# Patient Record
Sex: Female | Born: 1980
Health system: Southern US, Community
[De-identification: ages and names within clinical notes are randomized; demographics above are authoritative.]

## PROBLEM LIST (undated history)

## (undated) DIAGNOSIS — F99 Mental disorder, not otherwise specified: Secondary | ICD-10-CM

## (undated) DIAGNOSIS — F329 Major depressive disorder, single episode, unspecified: Secondary | ICD-10-CM

## (undated) DIAGNOSIS — F32A Depression, unspecified: Secondary | ICD-10-CM

## (undated) DIAGNOSIS — F259 Schizoaffective disorder, unspecified: Secondary | ICD-10-CM

## (undated) DIAGNOSIS — S32810A Multiple fractures of pelvis with stable disruption of pelvic ring, initial encounter for closed fracture: Secondary | ICD-10-CM

## (undated) DIAGNOSIS — R51 Headache: Secondary | ICD-10-CM

## (undated) DIAGNOSIS — N390 Urinary tract infection, site not specified: Secondary | ICD-10-CM

## (undated) DIAGNOSIS — R29898 Other symptoms and signs involving the musculoskeletal system: Secondary | ICD-10-CM

## (undated) DIAGNOSIS — D62 Acute posthemorrhagic anemia: Secondary | ICD-10-CM

## (undated) DIAGNOSIS — M25669 Stiffness of unspecified knee, not elsewhere classified: Secondary | ICD-10-CM

## (undated) DIAGNOSIS — S8263XA Displaced fracture of lateral malleolus of unspecified fibula, initial encounter for closed fracture: Secondary | ICD-10-CM

## (undated) DIAGNOSIS — N63 Unspecified lump in unspecified breast: Secondary | ICD-10-CM

## (undated) DIAGNOSIS — E669 Obesity, unspecified: Secondary | ICD-10-CM

## (undated) DIAGNOSIS — R112 Nausea with vomiting, unspecified: Secondary | ICD-10-CM

## (undated) DIAGNOSIS — B9620 Unspecified Escherichia coli [E. coli] as the cause of diseases classified elsewhere: Secondary | ICD-10-CM

## (undated) DIAGNOSIS — Z9889 Other specified postprocedural states: Secondary | ICD-10-CM

## (undated) DIAGNOSIS — T148XXA Other injury of unspecified body region, initial encounter: Secondary | ICD-10-CM

## (undated) DIAGNOSIS — F319 Bipolar disorder, unspecified: Secondary | ICD-10-CM

## (undated) DIAGNOSIS — F419 Anxiety disorder, unspecified: Secondary | ICD-10-CM

## (undated) DIAGNOSIS — S82143A Displaced bicondylar fracture of unspecified tibia, initial encounter for closed fracture: Secondary | ICD-10-CM

## (undated) DIAGNOSIS — G35 Multiple sclerosis: Secondary | ICD-10-CM

## (undated) DIAGNOSIS — N760 Acute vaginitis: Secondary | ICD-10-CM

## (undated) DIAGNOSIS — E785 Hyperlipidemia, unspecified: Secondary | ICD-10-CM

## (undated) HISTORY — DX: Displaced bicondylar fracture of unspecified tibia, initial encounter for closed fracture: S82.143A

## (undated) HISTORY — DX: Unspecified Escherichia coli (E. coli) as the cause of diseases classified elsewhere: B96.20

## (undated) HISTORY — DX: Urinary tract infection, site not specified: N39.0

## (undated) HISTORY — DX: Obesity, unspecified: E66.9

## (undated) HISTORY — DX: Displaced fracture of lateral malleolus of unspecified fibula, initial encounter for closed fracture: S82.63XA

## (undated) HISTORY — DX: Multiple fractures of pelvis with stable disruption of pelvic ring, initial encounter for closed fracture: S32.810A

## (undated) HISTORY — DX: Acute vaginitis: N76.0

## (undated) HISTORY — DX: Other symptoms and signs involving the musculoskeletal system: R29.898

## (undated) HISTORY — DX: Other injury of unspecified body region, initial encounter: T14.8XXA

## (undated) HISTORY — DX: Hyperlipidemia, unspecified: E78.5

## (undated) HISTORY — DX: Unspecified lump in unspecified breast: N63.0

## (undated) HISTORY — DX: Stiffness of unspecified knee, not elsewhere classified: M25.669

## (undated) HISTORY — DX: Bipolar disorder, unspecified: F31.9

---

## 2000-01-30 ENCOUNTER — Emergency Department (HOSPITAL_COMMUNITY): Admission: EM | Admit: 2000-01-30 | Discharge: 2000-01-30 | Payer: Self-pay | Admitting: Emergency Medicine

## 2000-05-10 ENCOUNTER — Encounter: Payer: Self-pay | Admitting: Emergency Medicine

## 2000-05-10 ENCOUNTER — Emergency Department (HOSPITAL_COMMUNITY): Admission: EM | Admit: 2000-05-10 | Discharge: 2000-05-10 | Payer: Self-pay | Admitting: Emergency Medicine

## 2000-10-26 ENCOUNTER — Other Ambulatory Visit: Admission: RE | Admit: 2000-10-26 | Discharge: 2000-10-26 | Payer: Self-pay | Admitting: Family Medicine

## 2000-12-13 ENCOUNTER — Other Ambulatory Visit: Admission: RE | Admit: 2000-12-13 | Discharge: 2000-12-13 | Payer: Self-pay | Admitting: Obstetrics and Gynecology

## 2000-12-14 ENCOUNTER — Encounter (INDEPENDENT_AMBULATORY_CARE_PROVIDER_SITE_OTHER): Payer: Self-pay

## 2000-12-14 ENCOUNTER — Other Ambulatory Visit: Admission: RE | Admit: 2000-12-14 | Discharge: 2000-12-14 | Payer: Self-pay | Admitting: Obstetrics and Gynecology

## 2001-07-03 ENCOUNTER — Other Ambulatory Visit: Admission: RE | Admit: 2001-07-03 | Discharge: 2001-07-03 | Payer: Self-pay | Admitting: Obstetrics and Gynecology

## 2003-10-26 DIAGNOSIS — G35 Multiple sclerosis: Secondary | ICD-10-CM

## 2003-10-26 HISTORY — DX: Multiple sclerosis: G35

## 2003-11-09 ENCOUNTER — Inpatient Hospital Stay (HOSPITAL_COMMUNITY): Admission: EM | Admit: 2003-11-09 | Discharge: 2003-11-18 | Payer: Self-pay | Admitting: Emergency Medicine

## 2003-11-12 ENCOUNTER — Encounter (INDEPENDENT_AMBULATORY_CARE_PROVIDER_SITE_OTHER): Payer: Self-pay | Admitting: Cardiology

## 2003-11-18 ENCOUNTER — Inpatient Hospital Stay (HOSPITAL_COMMUNITY)
Admission: RE | Admit: 2003-11-18 | Discharge: 2003-12-06 | Payer: Self-pay | Admitting: Physical Medicine & Rehabilitation

## 2003-12-09 ENCOUNTER — Encounter
Admission: RE | Admit: 2003-12-09 | Discharge: 2004-03-08 | Payer: Self-pay | Admitting: Physical Medicine & Rehabilitation

## 2003-12-23 ENCOUNTER — Other Ambulatory Visit: Admission: RE | Admit: 2003-12-23 | Discharge: 2003-12-23 | Payer: Self-pay | Admitting: *Deleted

## 2003-12-23 ENCOUNTER — Encounter: Admission: RE | Admit: 2003-12-23 | Discharge: 2003-12-23 | Payer: Self-pay | Admitting: Family Medicine

## 2004-03-09 ENCOUNTER — Encounter
Admission: RE | Admit: 2004-03-09 | Discharge: 2004-06-07 | Payer: Self-pay | Admitting: Physical Medicine & Rehabilitation

## 2004-08-06 ENCOUNTER — Ambulatory Visit: Payer: Self-pay | Admitting: Family Medicine

## 2004-08-11 ENCOUNTER — Encounter: Admission: RE | Admit: 2004-08-11 | Discharge: 2004-11-09 | Payer: Self-pay | Admitting: Neurology

## 2004-09-14 ENCOUNTER — Ambulatory Visit: Payer: Self-pay

## 2004-11-27 ENCOUNTER — Ambulatory Visit: Payer: Self-pay | Admitting: Sports Medicine

## 2004-12-28 ENCOUNTER — Encounter (INDEPENDENT_AMBULATORY_CARE_PROVIDER_SITE_OTHER): Payer: Self-pay | Admitting: *Deleted

## 2005-01-04 ENCOUNTER — Ambulatory Visit: Payer: Self-pay | Admitting: Family Medicine

## 2005-01-11 ENCOUNTER — Ambulatory Visit: Payer: Self-pay | Admitting: Family Medicine

## 2005-01-18 ENCOUNTER — Other Ambulatory Visit: Admission: RE | Admit: 2005-01-18 | Discharge: 2005-01-18 | Payer: Self-pay | Admitting: Family Medicine

## 2005-01-18 ENCOUNTER — Ambulatory Visit: Payer: Self-pay | Admitting: Family Medicine

## 2005-01-21 ENCOUNTER — Ambulatory Visit: Payer: Self-pay | Admitting: Family Medicine

## 2005-10-21 ENCOUNTER — Emergency Department (HOSPITAL_COMMUNITY): Admission: EM | Admit: 2005-10-21 | Discharge: 2005-10-21 | Payer: Self-pay | Admitting: Emergency Medicine

## 2005-11-03 ENCOUNTER — Encounter: Admission: RE | Admit: 2005-11-03 | Discharge: 2005-11-03 | Payer: Self-pay | Admitting: Sports Medicine

## 2005-11-03 ENCOUNTER — Ambulatory Visit: Payer: Self-pay | Admitting: Family Medicine

## 2005-12-02 ENCOUNTER — Emergency Department (HOSPITAL_COMMUNITY): Admission: EM | Admit: 2005-12-02 | Discharge: 2005-12-03 | Payer: Self-pay | Admitting: *Deleted

## 2005-12-05 ENCOUNTER — Emergency Department (HOSPITAL_COMMUNITY): Admission: EM | Admit: 2005-12-05 | Discharge: 2005-12-05 | Payer: Self-pay | Admitting: Emergency Medicine

## 2005-12-13 ENCOUNTER — Ambulatory Visit: Payer: Self-pay | Admitting: Family Medicine

## 2005-12-20 ENCOUNTER — Ambulatory Visit: Payer: Self-pay | Admitting: Family Medicine

## 2005-12-21 ENCOUNTER — Ambulatory Visit (HOSPITAL_COMMUNITY): Admission: RE | Admit: 2005-12-21 | Discharge: 2005-12-21 | Payer: Self-pay | Admitting: Family Medicine

## 2005-12-24 ENCOUNTER — Inpatient Hospital Stay (HOSPITAL_COMMUNITY): Admission: AD | Admit: 2005-12-24 | Discharge: 2005-12-24 | Payer: Self-pay | Admitting: *Deleted

## 2006-01-21 ENCOUNTER — Ambulatory Visit: Payer: Self-pay | Admitting: Family Medicine

## 2006-02-17 ENCOUNTER — Ambulatory Visit: Payer: Self-pay | Admitting: Family Medicine

## 2006-03-17 ENCOUNTER — Ambulatory Visit (HOSPITAL_COMMUNITY): Admission: RE | Admit: 2006-03-17 | Discharge: 2006-03-17 | Payer: Self-pay | Admitting: Family Medicine

## 2006-03-22 ENCOUNTER — Ambulatory Visit: Payer: Self-pay | Admitting: Family Medicine

## 2006-04-19 ENCOUNTER — Ambulatory Visit: Payer: Self-pay | Admitting: Family Medicine

## 2006-04-26 ENCOUNTER — Ambulatory Visit: Payer: Self-pay | Admitting: Family Medicine

## 2006-04-28 ENCOUNTER — Ambulatory Visit: Payer: Self-pay | Admitting: Family Medicine

## 2006-05-17 ENCOUNTER — Ambulatory Visit: Payer: Self-pay | Admitting: Family Medicine

## 2006-05-24 ENCOUNTER — Ambulatory Visit (HOSPITAL_COMMUNITY): Admission: RE | Admit: 2006-05-24 | Discharge: 2006-05-24 | Payer: Self-pay | Admitting: Internal Medicine

## 2006-06-23 ENCOUNTER — Ambulatory Visit: Payer: Self-pay | Admitting: Family Medicine

## 2006-07-04 ENCOUNTER — Inpatient Hospital Stay (HOSPITAL_COMMUNITY): Admission: AD | Admit: 2006-07-04 | Discharge: 2006-07-04 | Payer: Self-pay | Admitting: Family Medicine

## 2006-07-08 ENCOUNTER — Ambulatory Visit: Payer: Self-pay | Admitting: Family Medicine

## 2006-07-13 ENCOUNTER — Ambulatory Visit: Payer: Self-pay | Admitting: Family Medicine

## 2006-07-20 ENCOUNTER — Ambulatory Visit: Payer: Self-pay | Admitting: Family Medicine

## 2006-07-21 ENCOUNTER — Ambulatory Visit: Payer: Self-pay | Admitting: Family Medicine

## 2006-07-21 ENCOUNTER — Inpatient Hospital Stay (HOSPITAL_COMMUNITY): Admission: AD | Admit: 2006-07-21 | Discharge: 2006-07-21 | Payer: Self-pay | Admitting: Family Medicine

## 2006-07-22 ENCOUNTER — Ambulatory Visit (HOSPITAL_COMMUNITY): Admission: RE | Admit: 2006-07-22 | Discharge: 2006-07-22 | Payer: Self-pay | Admitting: Family Medicine

## 2006-07-28 ENCOUNTER — Ambulatory Visit: Payer: Self-pay | Admitting: Family Medicine

## 2006-08-02 ENCOUNTER — Ambulatory Visit: Payer: Self-pay | Admitting: Obstetrics and Gynecology

## 2006-08-02 ENCOUNTER — Inpatient Hospital Stay (HOSPITAL_COMMUNITY): Admission: AD | Admit: 2006-08-02 | Discharge: 2006-08-02 | Payer: Self-pay | Admitting: Obstetrics and Gynecology

## 2006-08-04 ENCOUNTER — Ambulatory Visit: Payer: Self-pay | Admitting: Family Medicine

## 2006-08-09 ENCOUNTER — Inpatient Hospital Stay (HOSPITAL_COMMUNITY): Admission: AD | Admit: 2006-08-09 | Discharge: 2006-08-09 | Payer: Self-pay | Admitting: Obstetrics and Gynecology

## 2006-08-09 ENCOUNTER — Ambulatory Visit: Payer: Self-pay | Admitting: *Deleted

## 2006-08-10 ENCOUNTER — Ambulatory Visit: Payer: Self-pay | Admitting: Family Medicine

## 2006-08-11 ENCOUNTER — Ambulatory Visit: Payer: Self-pay | Admitting: Obstetrics and Gynecology

## 2006-08-11 ENCOUNTER — Inpatient Hospital Stay (HOSPITAL_COMMUNITY): Admission: AD | Admit: 2006-08-11 | Discharge: 2006-08-12 | Payer: Self-pay | Admitting: Family Medicine

## 2006-08-14 ENCOUNTER — Inpatient Hospital Stay (HOSPITAL_COMMUNITY): Admission: AD | Admit: 2006-08-14 | Discharge: 2006-08-18 | Payer: Self-pay | Admitting: Obstetrics & Gynecology

## 2006-08-14 ENCOUNTER — Ambulatory Visit: Payer: Self-pay | Admitting: Certified Nurse Midwife

## 2006-08-21 ENCOUNTER — Inpatient Hospital Stay (HOSPITAL_COMMUNITY): Admission: AD | Admit: 2006-08-21 | Discharge: 2006-08-21 | Payer: Self-pay | Admitting: Obstetrics and Gynecology

## 2006-09-26 ENCOUNTER — Ambulatory Visit: Payer: Self-pay | Admitting: Family Medicine

## 2006-09-27 ENCOUNTER — Ambulatory Visit (HOSPITAL_COMMUNITY): Admission: RE | Admit: 2006-09-27 | Discharge: 2006-09-27 | Payer: Self-pay | Admitting: Family Medicine

## 2006-09-29 ENCOUNTER — Ambulatory Visit: Payer: Self-pay | Admitting: Family Medicine

## 2006-12-22 DIAGNOSIS — E785 Hyperlipidemia, unspecified: Secondary | ICD-10-CM | POA: Insufficient documentation

## 2006-12-22 DIAGNOSIS — E669 Obesity, unspecified: Secondary | ICD-10-CM | POA: Insufficient documentation

## 2006-12-22 DIAGNOSIS — G35 Multiple sclerosis: Secondary | ICD-10-CM | POA: Insufficient documentation

## 2006-12-23 ENCOUNTER — Encounter (INDEPENDENT_AMBULATORY_CARE_PROVIDER_SITE_OTHER): Payer: Self-pay | Admitting: *Deleted

## 2007-05-29 ENCOUNTER — Ambulatory Visit: Payer: Self-pay | Admitting: Family Medicine

## 2007-05-30 ENCOUNTER — Encounter (INDEPENDENT_AMBULATORY_CARE_PROVIDER_SITE_OTHER): Payer: Self-pay | Admitting: *Deleted

## 2007-05-30 ENCOUNTER — Encounter: Payer: Self-pay | Admitting: *Deleted

## 2007-06-07 ENCOUNTER — Emergency Department (HOSPITAL_COMMUNITY): Admission: EM | Admit: 2007-06-07 | Discharge: 2007-06-07 | Payer: Self-pay | Admitting: Emergency Medicine

## 2007-06-07 ENCOUNTER — Inpatient Hospital Stay (HOSPITAL_COMMUNITY): Admission: AD | Admit: 2007-06-07 | Discharge: 2007-06-14 | Payer: Self-pay | Admitting: Psychiatry

## 2007-06-07 ENCOUNTER — Ambulatory Visit: Payer: Self-pay | Admitting: Psychiatry

## 2007-06-29 ENCOUNTER — Encounter: Payer: Self-pay | Admitting: *Deleted

## 2007-07-15 ENCOUNTER — Emergency Department (HOSPITAL_COMMUNITY): Admission: EM | Admit: 2007-07-15 | Discharge: 2007-07-16 | Payer: Self-pay | Admitting: Emergency Medicine

## 2007-07-16 ENCOUNTER — Encounter (INDEPENDENT_AMBULATORY_CARE_PROVIDER_SITE_OTHER): Payer: Self-pay | Admitting: *Deleted

## 2007-11-16 ENCOUNTER — Ambulatory Visit: Payer: Self-pay | Admitting: Family Medicine

## 2007-11-16 ENCOUNTER — Encounter (INDEPENDENT_AMBULATORY_CARE_PROVIDER_SITE_OTHER): Payer: Self-pay | Admitting: *Deleted

## 2007-11-16 DIAGNOSIS — F319 Bipolar disorder, unspecified: Secondary | ICD-10-CM | POA: Insufficient documentation

## 2007-11-16 LAB — CONVERTED CEMR LAB
BUN: 13 mg/dL (ref 6–23)
Calcium: 9.6 mg/dL (ref 8.4–10.5)
Creatinine, Ser: 0.74 mg/dL (ref 0.40–1.20)
Glucose, Bld: 88 mg/dL (ref 70–99)
Hemoglobin: 13.2 g/dL (ref 12.0–15.0)
MCHC: 32.7 g/dL (ref 30.0–36.0)
MCV: 85.2 fL (ref 78.0–100.0)
RBC: 4.74 M/uL (ref 3.87–5.11)
RDW: 12.9 % (ref 11.5–15.5)
TSH: 1.68 microintl units/mL (ref 0.350–5.50)

## 2007-11-17 ENCOUNTER — Telehealth: Payer: Self-pay | Admitting: *Deleted

## 2007-11-20 ENCOUNTER — Encounter (INDEPENDENT_AMBULATORY_CARE_PROVIDER_SITE_OTHER): Payer: Self-pay | Admitting: *Deleted

## 2007-11-24 ENCOUNTER — Encounter (INDEPENDENT_AMBULATORY_CARE_PROVIDER_SITE_OTHER): Payer: Self-pay | Admitting: *Deleted

## 2007-11-29 ENCOUNTER — Encounter (INDEPENDENT_AMBULATORY_CARE_PROVIDER_SITE_OTHER): Payer: Self-pay | Admitting: *Deleted

## 2007-12-08 ENCOUNTER — Encounter: Payer: Self-pay | Admitting: *Deleted

## 2008-01-09 ENCOUNTER — Ambulatory Visit: Payer: Self-pay | Admitting: Family Medicine

## 2008-01-09 ENCOUNTER — Encounter (INDEPENDENT_AMBULATORY_CARE_PROVIDER_SITE_OTHER): Payer: Self-pay | Admitting: *Deleted

## 2008-01-09 LAB — CONVERTED CEMR LAB

## 2008-01-16 ENCOUNTER — Encounter (INDEPENDENT_AMBULATORY_CARE_PROVIDER_SITE_OTHER): Payer: Self-pay | Admitting: *Deleted

## 2008-01-16 ENCOUNTER — Ambulatory Visit: Payer: Self-pay | Admitting: Family Medicine

## 2008-01-22 ENCOUNTER — Ambulatory Visit: Payer: Self-pay | Admitting: Sports Medicine

## 2008-01-22 ENCOUNTER — Encounter: Payer: Self-pay | Admitting: *Deleted

## 2008-01-22 ENCOUNTER — Encounter (INDEPENDENT_AMBULATORY_CARE_PROVIDER_SITE_OTHER): Payer: Self-pay | Admitting: Family Medicine

## 2008-01-22 LAB — CONVERTED CEMR LAB
Bilirubin Urine: NEGATIVE
Glucose, Urine, Semiquant: NEGATIVE
Ketones, urine, test strip: NEGATIVE
Protein, U semiquant: NEGATIVE
pH: 7.5

## 2008-02-09 ENCOUNTER — Ambulatory Visit: Payer: Self-pay | Admitting: Family Medicine

## 2008-08-12 ENCOUNTER — Telehealth: Payer: Self-pay | Admitting: *Deleted

## 2008-08-13 ENCOUNTER — Encounter (INDEPENDENT_AMBULATORY_CARE_PROVIDER_SITE_OTHER): Payer: Self-pay | Admitting: *Deleted

## 2008-11-04 ENCOUNTER — Telehealth: Payer: Self-pay | Admitting: *Deleted

## 2008-11-07 ENCOUNTER — Encounter: Payer: Self-pay | Admitting: Family Medicine

## 2008-11-07 ENCOUNTER — Ambulatory Visit: Payer: Self-pay | Admitting: Family Medicine

## 2008-11-08 ENCOUNTER — Telehealth: Payer: Self-pay | Admitting: Family Medicine

## 2008-11-08 LAB — CONVERTED CEMR LAB: Preg, Serum: NEGATIVE

## 2008-11-20 ENCOUNTER — Telehealth: Payer: Self-pay | Admitting: *Deleted

## 2008-11-21 ENCOUNTER — Ambulatory Visit: Payer: Self-pay | Admitting: Family Medicine

## 2008-12-05 ENCOUNTER — Encounter: Payer: Self-pay | Admitting: Family Medicine

## 2008-12-10 ENCOUNTER — Ambulatory Visit: Payer: Self-pay | Admitting: Family Medicine

## 2008-12-11 ENCOUNTER — Encounter: Payer: Self-pay | Admitting: Family Medicine

## 2008-12-11 ENCOUNTER — Telehealth: Payer: Self-pay | Admitting: Family Medicine

## 2009-01-03 ENCOUNTER — Telehealth: Payer: Self-pay | Admitting: Family Medicine

## 2009-01-07 ENCOUNTER — Telehealth (INDEPENDENT_AMBULATORY_CARE_PROVIDER_SITE_OTHER): Payer: Self-pay | Admitting: Family Medicine

## 2009-01-10 ENCOUNTER — Ambulatory Visit: Payer: Self-pay | Admitting: Family Medicine

## 2009-03-27 ENCOUNTER — Ambulatory Visit: Payer: Self-pay | Admitting: Family Medicine

## 2009-03-27 ENCOUNTER — Encounter: Payer: Self-pay | Admitting: Family Medicine

## 2009-05-05 ENCOUNTER — Telehealth: Payer: Self-pay | Admitting: Family Medicine

## 2009-05-05 ENCOUNTER — Ambulatory Visit: Payer: Self-pay | Admitting: Family Medicine

## 2009-05-19 ENCOUNTER — Encounter: Payer: Self-pay | Admitting: Family Medicine

## 2009-07-14 ENCOUNTER — Telehealth: Payer: Self-pay | Admitting: Family Medicine

## 2009-07-31 ENCOUNTER — Encounter: Payer: Self-pay | Admitting: Family Medicine

## 2009-07-31 ENCOUNTER — Emergency Department (HOSPITAL_COMMUNITY): Admission: EM | Admit: 2009-07-31 | Discharge: 2009-07-31 | Payer: Self-pay | Admitting: Emergency Medicine

## 2009-08-01 ENCOUNTER — Encounter: Payer: Self-pay | Admitting: Family Medicine

## 2009-09-30 ENCOUNTER — Encounter: Payer: Self-pay | Admitting: Family Medicine

## 2009-09-30 ENCOUNTER — Ambulatory Visit: Payer: Self-pay | Admitting: Family Medicine

## 2009-09-30 DIAGNOSIS — T8339XA Other mechanical complication of intrauterine contraceptive device, initial encounter: Secondary | ICD-10-CM | POA: Insufficient documentation

## 2009-09-30 LAB — CONVERTED CEMR LAB
Chlamydia, DNA Probe: NEGATIVE
GC Probe Amp, Genital: NEGATIVE

## 2009-10-02 ENCOUNTER — Ambulatory Visit (HOSPITAL_COMMUNITY): Admission: RE | Admit: 2009-10-02 | Discharge: 2009-10-02 | Payer: Self-pay | Admitting: Family Medicine

## 2009-10-06 ENCOUNTER — Encounter: Payer: Self-pay | Admitting: Family Medicine

## 2009-10-21 LAB — CONVERTED CEMR LAB: Pap Smear: NORMAL

## 2009-10-27 ENCOUNTER — Encounter (INDEPENDENT_AMBULATORY_CARE_PROVIDER_SITE_OTHER): Payer: Self-pay | Admitting: *Deleted

## 2009-10-27 DIAGNOSIS — F17200 Nicotine dependence, unspecified, uncomplicated: Secondary | ICD-10-CM

## 2009-11-10 ENCOUNTER — Ambulatory Visit: Payer: Self-pay | Admitting: Family Medicine

## 2009-11-12 ENCOUNTER — Ambulatory Visit: Payer: Self-pay | Admitting: Family Medicine

## 2010-02-18 ENCOUNTER — Encounter: Payer: Self-pay | Admitting: Family Medicine

## 2010-02-18 ENCOUNTER — Ambulatory Visit: Payer: Self-pay | Admitting: Family Medicine

## 2010-02-23 ENCOUNTER — Encounter: Payer: Self-pay | Admitting: Family Medicine

## 2010-02-24 ENCOUNTER — Ambulatory Visit: Payer: Self-pay | Admitting: Family Medicine

## 2010-03-20 ENCOUNTER — Encounter: Payer: Self-pay | Admitting: *Deleted

## 2010-04-07 ENCOUNTER — Encounter: Payer: Self-pay | Admitting: Family Medicine

## 2010-04-07 DIAGNOSIS — F251 Schizoaffective disorder, depressive type: Secondary | ICD-10-CM | POA: Insufficient documentation

## 2010-04-07 DIAGNOSIS — F259 Schizoaffective disorder, unspecified: Secondary | ICD-10-CM | POA: Insufficient documentation

## 2010-04-17 ENCOUNTER — Encounter: Payer: Self-pay | Admitting: *Deleted

## 2010-04-28 ENCOUNTER — Ambulatory Visit: Payer: Self-pay | Admitting: Family Medicine

## 2010-04-28 DIAGNOSIS — N912 Amenorrhea, unspecified: Secondary | ICD-10-CM | POA: Insufficient documentation

## 2010-05-13 ENCOUNTER — Emergency Department (HOSPITAL_COMMUNITY)
Admission: EM | Admit: 2010-05-13 | Discharge: 2010-05-14 | Payer: Self-pay | Source: Home / Self Care | Admitting: Emergency Medicine

## 2010-07-17 ENCOUNTER — Encounter: Payer: Self-pay | Admitting: Emergency Medicine

## 2010-07-18 ENCOUNTER — Ambulatory Visit: Payer: Self-pay | Admitting: Psychiatry

## 2010-07-18 ENCOUNTER — Inpatient Hospital Stay (HOSPITAL_COMMUNITY): Admission: AD | Admit: 2010-07-18 | Discharge: 2010-07-28 | Payer: Self-pay | Admitting: Psychiatry

## 2010-07-30 ENCOUNTER — Telehealth: Payer: Self-pay | Admitting: *Deleted

## 2010-07-30 ENCOUNTER — Ambulatory Visit: Payer: Self-pay | Admitting: Family Medicine

## 2010-07-30 DIAGNOSIS — R300 Dysuria: Secondary | ICD-10-CM | POA: Insufficient documentation

## 2010-07-30 LAB — CONVERTED CEMR LAB
Ketones, urine, test strip: NEGATIVE
Nitrite: NEGATIVE
Urobilinogen, UA: 0.2

## 2010-10-05 ENCOUNTER — Ambulatory Visit: Payer: Self-pay | Admitting: Family Medicine

## 2010-10-05 ENCOUNTER — Other Ambulatory Visit
Admission: RE | Admit: 2010-10-05 | Discharge: 2010-10-05 | Payer: Self-pay | Source: Home / Self Care | Admitting: Family Medicine

## 2010-10-05 DIAGNOSIS — N63 Unspecified lump in unspecified breast: Secondary | ICD-10-CM | POA: Insufficient documentation

## 2010-10-05 DIAGNOSIS — N760 Acute vaginitis: Secondary | ICD-10-CM

## 2010-10-05 HISTORY — DX: Unspecified lump in unspecified breast: N63.0

## 2010-10-05 HISTORY — DX: Acute vaginitis: N76.0

## 2010-10-05 LAB — CONVERTED CEMR LAB

## 2010-10-09 ENCOUNTER — Encounter
Admission: RE | Admit: 2010-10-09 | Discharge: 2010-10-09 | Payer: Self-pay | Source: Home / Self Care | Attending: Family Medicine | Admitting: Family Medicine

## 2010-10-09 ENCOUNTER — Ambulatory Visit (HOSPITAL_COMMUNITY)
Admission: RE | Admit: 2010-10-09 | Discharge: 2010-10-09 | Payer: Self-pay | Source: Home / Self Care | Attending: Family Medicine | Admitting: Family Medicine

## 2010-10-23 ENCOUNTER — Encounter
Admission: RE | Admit: 2010-10-23 | Discharge: 2010-10-23 | Payer: Self-pay | Source: Home / Self Care | Attending: Neurology | Admitting: Neurology

## 2010-11-14 ENCOUNTER — Encounter: Payer: Self-pay | Admitting: Neurology

## 2010-11-15 ENCOUNTER — Encounter: Payer: Self-pay | Admitting: Obstetrics and Gynecology

## 2010-11-24 NOTE — Assessment & Plan Note (Signed)
Summary: rash all over,df   Vital Signs:  Patient profile:   30 year old female Height:      64.25 inches Weight:      235.1 pounds BMI:     40.19 Temp:     98.5 degrees F oral Pulse rate:   91 / minute BP sitting:   116 / 76  (left arm) Cuff size:   regular  Vitals Entered By: Garen Grams LPN (Feb 25, 1307 1:59 PM) CC: rash on arms and legs x 2 days Is Patient Diabetic? No Pain Assessment Patient in pain? no        Primary Care Provider:  Ancil Boozer  MD  CC:  rash on arms and legs x 2 days.  History of Present Illness: 30 yo female with 2 day h/o nonpruritic rash on bilateral arms and legs.  Rash came on all at once and is not changing or migrating.  She has been taking Keflex (was to switch to Cipro but hasn't received notifcation of that yet) since last week for UTI but denies new exposures to food, detergents, animals, insect bites.  No fevers, nasal congestion, cough.  Habits & Providers  Alcohol-Tobacco-Diet     Tobacco Status: current     Tobacco Counseling: to quit use of tobacco products  Current Medications (verified): 1)  Mirena 20 Mcg/24hr Iud (Levonorgestrel) .... Placed 03/27/09 2)  Sertraline Hcl 50 Mg Tabs (Sertraline Hcl) .Marland Kitchen.. 1 By Mouth Qam 3)  Seroquel 200 Mg Tabs (Quetiapine Fumarate) .... 2 By Mouth Two Times A Day 4)  Copaxone 20 Mg/ml Kit (Glatiramer Acetate) .... For Ms Per Neurologist 5)  Ciprofloxacin Hcl 500 Mg Tabs (Ciprofloxacin Hcl) .... One By Mouth Two Times A Day X 7 Days 6)  Pyridium 200 Mg Tabs (Phenazopyridine Hcl) .... Onep O Three Times A Day For 48 Hours  Allergies (verified): 1)  ! Cephalexin (Cephalexin)  Social History: Smoking Status:  current  Review of Systems       Per HPI.  Physical Exam  General:  Well-developed,well-nourished,in no acute distress; alert,appropriate and cooperative throughout examination Mouth:  No mucosa lesions. Skin:  Papular rash bilateral UE from hands to midway between elbow and  shoulder.  Also on thighs bilateral.   Impression & Recommendations:  Problem # 1:  ADVERSE DRUG REACTION (ICD-995.20) Assessment New  Discontinue Keflex.  Start Cipro for UTI.  Orders: [facility]- Est Level  3 (65784)  Complete Medication List: 1)  Mirena 20 Mcg/24hr Iud (Levonorgestrel) .... Placed 03/27/09 2)  Sertraline Hcl 50 Mg Tabs (Sertraline hcl) .Marland Kitchen.. 1 by mouth qam 3)  Seroquel 200 Mg Tabs (Quetiapine fumarate) .... 2 by mouth two times a day 4)  Copaxone 20 Mg/ml Kit (Glatiramer acetate) .... For ms per neurologist 5)  Ciprofloxacin Hcl 500 Mg Tabs (Ciprofloxacin hcl) .... One by mouth two times a day x 7 days 6)  Pyridium 200 Mg Tabs (Phenazopyridine hcl) .... Onep o three times a day for 48 hours  Patient Instructions: 1)  I think this is a reaction to the antibiotic you started last week. 2)  Please stop taking the Keflex (Cephalexin) and pick up the prescription for Ciprofloxacin which was sent to the [facility] on [address]--[facility] [address]. Prescriptions: CIPROFLOXACIN HCL 500 MG TABS (CIPROFLOXACIN HCL) one by mouth two times a day x 7 days  #14 x 0   Entered and Authorized by:   Romero Belling MD   Signed by:  Romero Belling MD on 02/24/2010   Method used:   [facility]        [facility] [address] [phone]* (retail)       [address]       [place], [place]  [address]       Ph: [phone] or [phone]       Fax: [phone]   RxID:   [id number]

## 2010-11-24 NOTE — Letter (Signed)
Summary: Generic Letter  [facility]  [address]   [place], [place] [address]   Phone: [phone]  Fax: [phone]    02/23/2010  Allison Moore [address] [place], [place]  [address]  Dear Ms. Borghi,  I tried to call you by phone but got someone else on the voicemail. I wanted to let you know that the urine culture we did shows that we need to put you on a different antibiotic for your urinary tract infection. I've sent a prescription to the pharmacy for Cipro. Please call us to let us know that you got this letter and have picked up the medicine. Also please make sure we have a good phone number for you. Let us know if you have questions.  Sincerely,   Myrtie Soman  MD  Appended Document: Generic Letter mailed.

## 2010-11-24 NOTE — Letter (Signed)
Summary: Suspension Letter  [facility]  [address]   [place], [place] [address]   Phone: [phone]  Fax: [phone]    04/17/2010  Allison Moore [address] [place], [place]  [address]  Dear Ms. Holloway,  You have missed 4 scheduled appointments with our practice.If you cannot keep your appointment, we expect you to call and cancel at least 24 hours before your appointment time.  As per our policy, we will now only give you limited medical services. means we will not call in a refill for you, or complete a form or make a referral except when you are here for a scheduled office visit.   If you miss 2 more appointments in the next year, we will dismiss you from our practice.  We hope this does not happen.  If you keep your appointments for the next year you will be returned to regular patient status.  We hope these changes will encourage you to keep your appointments so we may provide you the best medical care.   Our office staff can be reached at [phone] Monday through Friday from 8:30 a.m.-5:00 p.m. and will be glad to schedule your appointment as necessary.     Sincerely,   [facility]    Appended Document: Suspension Letter mailed  Appended Document: Suspension Letter letter returned - unclaimed sent regular mail

## 2010-11-24 NOTE — Assessment & Plan Note (Signed)
Summary: uti?,df   Vital Signs:  Patient profile:   30 year old female Height:      64.25 inches Weight:      231 pounds BMI:     39.49 BSA:     2.09 Temp:     98.1 degrees F Pulse rate:   91 / minute BP sitting:   115 / 61  Vitals Entered By: Jone Baseman CMA (February 18, 2010 2:35 PM) CC: ? UTI x 3-4 days Is Patient Diabetic? No Pain Assessment Patient in pain? no        Primary Care Provider:  Ancil Boozer  MD  CC:  ? UTI x 3-4 days.  History of Present Illness: 1. ? uti reports dysuria for the last 4 days. Notes burning and pain with urination. Doesn't think she's urinating more frequently. Has MS and thinks that this contributes to UTIs in her. Has had about 4 in the last 2 years she thinks. Thinks her last one was in November. Denies any history of pyelo.  ROS: denies fevers, chills, nausea, vomting; has had some loose stools. Denies any back or flank pain.  PMHx: MS, history of 4 UTIs  Habits & Providers  Alcohol-Tobacco-Diet     Tobacco Status: never  Current Medications (verified): 1)  Mirena 20 Mcg/24hr Iud (Levonorgestrel) .... Placed 03/27/09 2)  Sertraline Hcl 50 Mg Tabs (Sertraline Hcl) .Marland Kitchen.. 1 By Mouth Qam 3)  Seroquel 200 Mg Tabs (Quetiapine Fumarate) .... 2 By Mouth Two Times A Day 4)  Copaxone 20 Mg/ml Kit (Glatiramer Acetate) .... For Ms Per Neurologist  Allergies (verified): No Known Drug Allergies  Social History: Smoking Status:  never  Review of Systems       review of systems as noted in HPI section   Physical Exam  General:  Vital signs reviewed -- obese but otherwise normal Alert, appropriate; well-dressed and well-nourished  Lungs:  work of breathing unlabored, clear to auscultation bilaterally; no wheezes, rales, or ronchi; good air movement throughout  Heart:  regular rate and rhythm, no murmurs; normal s1/s2  Abdomen:  no CVAT or suprapubic tenderness to palpation    Impression & Recommendations:  Problem # 1:  DYSURIA  (ICD-788.1) Assessment New UA consistent with UTI. Start keflex and send for culture. Pyridium for symptoms x 48 hours. Return parameters discussed.  Patient agreeable. See instructions  The following medications were removed from the medication list:    Metronidazole 500 Mg Tabs (Metronidazole) .Marland Kitchen... 1 by mouth two times a day for 7 days Her updated medication list for this problem includes:    Keflex 500 Mg Caps (Cephalexin) ..... One by mouth two times a day for 10 days    Pyridium 200 Mg Tabs (Phenazopyridine hcl) ..... Onep o three times a day for 48 hours  Orders: Urinalysis-[facility] (00000) Urine Culture-[facility] ([id number]) [facility]- Est  Level 4 (63875)  Complete Medication List: 1)  Mirena 20 Mcg/24hr Iud (Levonorgestrel) .... Placed 03/27/09 2)  Sertraline Hcl 50 Mg Tabs (Sertraline hcl) .Marland Kitchen.. 1 by mouth qam 3)  Seroquel 200 Mg Tabs (Quetiapine fumarate) .... 2 by mouth two times a day 4)  Copaxone 20 Mg/ml Kit (Glatiramer acetate) .... For ms per neurologist 5)  Keflex 500 Mg Caps (Cephalexin) .... One by mouth two times a day for 10 days 6)  Pyridium 200 Mg Tabs (Phenazopyridine hcl) .... Onep o three times a day for 48 hours  Patient Instructions: 1)  take the antibiotic two times a day for 10  days 2)  use the pyridium to help with your burning and pain 3)  drink lots and lots of liquids 4)  cranberry juice can help too 5)  follow-up if you develop fever, chills, side/back pain or other concerning symptoms 6)  nice to meet you today Prescriptions: KEFLEX 500 MG CAPS (CEPHALEXIN) one by mouth two times a day for 10 days  #20 x 0   Entered and Authorized by:   Myrtie Soman  MD   Signed by:   Myrtie Soman  MD on 02/18/2010   Method used:   [facility]        [facility] [address] [phone]* (retail)       [address]       [place], [place]  [id number]       Ph: [phone] or [phone]       Fax: [phone]   RxID:   [id number] PYRIDIUM  200 MG TABS (PHENAZOPYRIDINE HCL) onep o three times a day for 48 hours  #6 x 0   Entered and Authorized by:   Myrtie Soman  MD   Signed by:   Myrtie Soman  MD on 02/18/2010   Method used:   [facility]        [facility]  [address] [phone]* (retail)       [address]       [place], [place]  [address]       Ph: [phone] or [phone]       Fax: [phone]   RxID:   [id number] KEFLEX 500 MG CAPS (CEPHALEXIN) one by mouth two times a day for 10 days  #20 x 0   Entered and Authorized by:   Myrtie Soman  MD   Signed by:   Myrtie Soman  MD on 02/18/2010   Method used:   Electronically to        [facility] [id number]* (retail)       [address] [place], [place]  [address]       Ph: [phone]       Fax: [phone]   RxID:   [id number]   Appended Document: urine report     Lab Visit  Laboratory Results   Urine Tests  Date/Time Received: February 18, 2010 2:57 PM  Date/Time Reported: February 18, 2010 5:08 PM   Routine Urinalysis   Color: yellow Appearance: Cloudy Glucose: negative   (Normal Range: Negative) Bilirubin: negative   (Normal Range: Negative) Ketone: negative   (Normal Range: Negative) Spec. Gravity: 1.015   (Normal Range: 1.003-1.035) Blood: large   (Normal Range: Negative) pH: 6.0   (Normal Range: 5.0-8.0) Protein: 100   (Normal Range: Negative) Urobilinogen: 1.0   (Normal Range: 0-1) Nitrite: positive   (Normal Range: Negative) Leukocyte Esterace: moderate   (Normal Range: Negative)  Urine Microscopic WBC/HPF: LOADED RBC/HPF: >20 Bacteria/HPF: 3+ Epithelial/HPF: 1-5    Comments: cath urine;  urine cultured ...............test performed by......Marland KitchenBonnie A. [place], MLS (ASCP)cm    Orders Today:

## 2010-11-24 NOTE — Assessment & Plan Note (Signed)
Summary: READ PPD/KH   Nurse Visit   Allergies: No Known Drug Allergies  PPD Results    Date of reading: 11/12/2009    Results: < 5mm    Interpretation: negative  Orders Added: 1)  No Charge Patient Arrived (NCPA0) [NCPA0]
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
## 2010-11-24 NOTE — Miscellaneous (Signed)
Summary: Tobacco Leven Hoel  Clinical Lists Changes  Problems: Added new problem of TOBACCO Katleen Carraway (ICD-305.1) 

## 2010-11-24 NOTE — Letter (Signed)
Summary: Probation Letter  [facility]  [address]   [place], [place] [address]   Phone: [phone]  Fax: [phone]    03/20/2010  Allison Moore [address] [place], [place]  [address]  Dear [name],  With the goal of better serving all our patients the [facility] is following each patient's missed appointments.  You have missed at least 3 appointments with our practice.If you cannot keep your appointment, we expect you to call at least 24 hours before your appointment time.  Missing appointments prevents other patients from seeing [place] and makes it difficult to provide you with the best possible medical care.      1.   If you miss one more appointment, we will only give you limited medical services. This means we will not call in medication refills, complete a form, or make a referral for you except when you are here for a scheduled office visit.    2.   If you miss 2 or more appointments in the next year, we will dismiss you from our practice.    Our office staff can be reached at [phone] Monday through Friday from 8:30 a.m.-5:00 p.m. and will be glad to schedule your appointment as necessary.    Thank you.   [facility]  Appended Document: Probation Letter cert mailed

## 2010-11-24 NOTE — Assessment & Plan Note (Signed)
Summary: tb test,tcb   Nurse Visit   Allergies: No Known Drug Allergies  Immunizations Administered:  PPD Skin Test:    Vaccine Type: PPD    Site: left forearm    Mfr: Sanofi Pasteur    Dose: 0.1 ml    Route: ID    Given by: Theresia Lo RN    Exp. Date: 02/18/2012    Lot #: [id number]  Orders Added: 1)  TB Skin Test [86580] 2)  Admin 1st Vaccine [id number]

## 2010-11-24 NOTE — Progress Notes (Signed)
Summary: triage   Phone Note Call from Patient Call back at [phone]   Caller: Patient Summary of Call: thinks she has a UTI Initial call taken by: De Nurse,  July 30, 2010 10:46 AM  Follow-up for Phone Call        LM Follow-up by: Golden Circle RN,  July 30, 2010 10:57 AM  Additional Follow-up for Phone Call Additional follow up Details #1::        freq urination & incontinence at night. no pain with urination. appt at 3:30 today with Dr. Fara Boros Additional Follow-up by: Golden Circle RN,  July 30, 2010 11:08 AM
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
## 2010-12-07 ENCOUNTER — Encounter: Payer: Self-pay | Admitting: Family Medicine

## 2010-12-07 ENCOUNTER — Ambulatory Visit (INDEPENDENT_AMBULATORY_CARE_PROVIDER_SITE_OTHER): Payer: Medicare Other | Admitting: Family Medicine

## 2010-12-07 VITALS — BP 114/77 | HR 84 | Temp 98.6°F | Wt 238.4 lb

## 2010-12-07 DIAGNOSIS — E669 Obesity, unspecified: Secondary | ICD-10-CM

## 2010-12-07 DIAGNOSIS — N912 Amenorrhea, unspecified: Secondary | ICD-10-CM

## 2010-12-07 DIAGNOSIS — N911 Secondary amenorrhea: Secondary | ICD-10-CM

## 2010-12-07 DIAGNOSIS — F259 Schizoaffective disorder, unspecified: Secondary | ICD-10-CM

## 2010-12-07 LAB — CBC WITH DIFFERENTIAL/PLATELET
Basophils Absolute: 0 10*3/uL (ref 0.0–0.1)
Eosinophils Absolute: 0.1 10*3/uL (ref 0.0–0.7)
Eosinophils Relative: 1 % (ref 0–5)
MCH: 29.8 pg (ref 26.0–34.0)
MCV: 86.1 fL (ref 78.0–100.0)
Platelets: 306 10*3/uL (ref 150–400)
RDW: 12.8 % (ref 11.5–15.5)

## 2010-12-07 LAB — POCT GLYCOSYLATED HEMOGLOBIN (HGB A1C): Hemoglobin A1C: 5.2

## 2010-12-07 NOTE — Progress Notes (Deleted)
  Subjective:    Patient ID: Allison Moore, female    DOB: 1981-04-29, 29 y.o.   MRN: [id number]  HPI    Review of Systems     Objective:   Physical Exam        Assessment & Plan:

## 2010-12-07 NOTE — Patient Instructions (Signed)
It was a pleasure to care for you today.  Please make a follow up appointment in 3 months or prn.  Try over the counter miralax as tolerated daily for constipation.  We are checking labs today.  If they are abnormal we will give you a call.  Thank you for your patience and understanding today.

## 2010-12-07 NOTE — Progress Notes (Signed)
Subjective:     Allison Moore is a 30 y.o. female and is here for a comprehensive physical exam. The patient reports no acute complaints however, she has noticed some constipation recently resulting in the utilization of enemas.  Do you take any herbs or supplements that were not prescribed by a doctor? no Are you taking calcium supplements? no Are you taking aspirin daily? no  GU History: LMP: No LMP recorded. Patient is not currently having periods (Reason: IUD). Last pap date: 10/2010 Abnormal pap? no Gravida: 0 Para: 0  The following portions of the patient's history were reviewed and updated as appropriate: allergies, current medications, past family history, past medical history, past social history, past surgical history and problem list.  Review of Systems Do you have pain that bothers you in your daily life? no Pertinent items are noted in HPI.   Objective:    BP 114/77  Pulse 84  Temp(Src) 98.6 F (37 C) (Oral)  Wt 238 lb 6.4 oz (108.138 kg)  General Appearance:    Alert, cooperative, no distress, appears stated age  Head:    Normocephalic, without obvious abnormality, atraumatic  Eyes:    PERRL, conjunctiva/corneas clear, EOM's intact, fundi    benign, both eyes  Ears:    Normal TM's and external ear canals, both ears  Nose:   Nares normal, septum midline, mucosa normal, no drainage    or sinus tenderness  Throat:   Lips, mucosa, and tongue normal; teeth and gums normal  Neck:   Supple, symmetrical, trachea midline, no adenopathy;    thyroid:  no enlargement/tenderness/nodules; no carotid   bruit or JVD  Back:     Symmetric, no curvature, ROM normal, no CVA tenderness  Lungs:     Clear to auscultation bilaterally, respirations unlabored  Chest Wall:    No tenderness or deformity   Heart:    Regular rate and rhythm, S1 and S2 normal, no murmur, rub   or gallop  Breast Exam:    No tenderness, masses, or nipple abnormality  Abdomen:     Soft, non-tender, bowel  sounds active all four quadrants,    no masses, no organomegaly  Genitalia:    Normal female without lesion, discharge or tenderness  Rectal:    Normal tone, normal prostate, no masses or tenderness;   guaiac negative stool  Extremities:   Extremities normal, atraumatic, no cyanosis or edema  Pulses:   2+ and symmetric all extremities  Skin:   Skin color, texture, turgor normal, no rashes or lesions  Lymph nodes:   Cervical, supraclavicular, and axillary nodes normal  Neurologic:   CNII-XII intact, normal strength, sensation and reflexes    throughout      Assessment:    Healthy female exam.   1. Antipsychotics-will check CBC, CMP, HgA1C, and prolactin 2. Constipation: increased fiber, suggested miralax OTC for supplement.   Plan:    1. Follow up in 3 months. 2. Patient Counseling: --Nutrition: Stressed importance of moderation in sodium/caffeine intake, saturated fat and cholesterol, caloric balance, sufficient intake of fresh fruits, vegetables, fiber, calcium, iron, and 1 mg of folate supplement per day (for females capable of pregnancy). --Discussed the issue of estrogen replacement, calcium supplement, and the daily use of baby aspirin. --Exercise: Stressed the importance of regular exercise.  --Substance Abuse: Discussed cessation/primary prevention of tobacco, alcohol, or other drug use; driving or other dangerous activities under the influence; availability of treatment for abuse.   --Sexuality: Discussed sexually transmitted diseases, partner selection,  use of condoms, avoidance of unintended pregnancy  and contraceptive alternatives.  --Injury prevention: Discussed safety belts, safety helmets, smoke detector, smoking near bedding or upholstery.  --Dental health: Discussed importance of regular tooth brushing, flossing, and dental visits. --Immunizations reviewed. --Discussed benefits of screening colonoscopy. --After hours service discussed with patient  3. Follow up in 3  month

## 2010-12-08 LAB — BASIC METABOLIC PANEL
BUN: 13 mg/dL (ref 6–23)
Creat: 0.79 mg/dL (ref 0.40–1.20)

## 2011-01-07 LAB — CBC
HCT: 36 % (ref 36.0–46.0)
Hemoglobin: 12.4 g/dL (ref 12.0–15.0)
MCH: 30.4 pg (ref 26.0–34.0)
MCHC: 34.4 g/dL (ref 30.0–36.0)
MCV: 88.4 fL (ref 78.0–100.0)
Platelets: 291 10*3/uL (ref 150–400)
RBC: 4.07 MIL/uL (ref 3.87–5.11)
RDW: 12.9 % (ref 11.5–15.5)
WBC: 5.8 10*3/uL (ref 4.0–10.5)

## 2011-01-07 LAB — DIFFERENTIAL
Basophils Absolute: 0.1 10*3/uL (ref 0.0–0.1)
Basophils Relative: 1 % (ref 0–1)
Eosinophils Absolute: 0 K/uL (ref 0.0–0.7)
Eosinophils Relative: 1 % (ref 0–5)
Lymphocytes Relative: 50 % — ABNORMAL HIGH (ref 12–46)
Lymphs Abs: 2.9 10*3/uL (ref 0.7–4.0)
Monocytes Absolute: 0.2 K/uL (ref 0.1–1.0)
Monocytes Relative: 4 % (ref 3–12)
Neutro Abs: 2.6 10*3/uL (ref 1.7–7.7)
Neutrophils Relative %: 44 % (ref 43–77)

## 2011-01-07 LAB — BASIC METABOLIC PANEL
BUN: 11 mg/dL (ref 6–23)
Creatinine, Ser: 0.73 mg/dL (ref 0.4–1.2)
GFR calc Af Amer: >60 mL/min (ref >60)
GFR calc non Af Amer: >60 mL/min (ref >60)
Potassium: 3.3 mEq/L — ABNORMAL LOW (ref 3.5–5.1)

## 2011-01-07 LAB — URINALYSIS, ROUTINE W REFLEX MICROSCOPIC
Bilirubin Urine: NEGATIVE
Glucose, UA: NEGATIVE mg/dL
Ketones, ur: NEGATIVE mg/dL
Leukocytes, UA: NEGATIVE
pH: 7 (ref 5.0–8.0)

## 2011-01-07 LAB — BASIC METABOLIC PANEL WITH GFR
CO2: 29 meq/L (ref 19–32)
Calcium: 9.2 mg/dL (ref 8.4–10.5)
Chloride: 108 meq/L (ref 96–112)
Glucose, Bld: 103 mg/dL — ABNORMAL HIGH (ref 70–99)
Sodium: 142 meq/L (ref 135–145)

## 2011-01-07 LAB — RAPID URINE DRUG SCREEN, HOSP PERFORMED
Amphetamines: NOT DETECTED
Barbiturates: NOT DETECTED
Benzodiazepines: NOT DETECTED
Cocaine: NOT DETECTED
Opiates: NOT DETECTED
Tetrahydrocannabinol: POSITIVE — AB

## 2011-01-07 LAB — GLUCOSE, CAPILLARY: Glucose-Capillary: 98 mg/dL (ref 70–99)

## 2011-01-07 LAB — ETHANOL: Alcohol, Ethyl (B): <5 mg/dL (ref 0–10)

## 2011-01-07 LAB — URINE MICROSCOPIC-ADD ON

## 2011-01-07 LAB — TRICYCLICS SCREEN, URINE: TCA Scrn: NOT DETECTED

## 2011-01-08 ENCOUNTER — Ambulatory Visit (INDEPENDENT_AMBULATORY_CARE_PROVIDER_SITE_OTHER): Payer: Medicare Other | Admitting: Family Medicine

## 2011-01-08 ENCOUNTER — Encounter: Payer: Self-pay | Admitting: Family Medicine

## 2011-01-08 VITALS — BP 124/75 | HR 77 | Temp 98.6°F | Ht 64.75 in | Wt 228.1 lb

## 2011-01-08 DIAGNOSIS — R300 Dysuria: Secondary | ICD-10-CM

## 2011-01-08 DIAGNOSIS — N390 Urinary tract infection, site not specified: Secondary | ICD-10-CM

## 2011-01-08 LAB — POCT UA - MICROSCOPIC ONLY

## 2011-01-08 LAB — POCT URINALYSIS DIPSTICK: pH, UA: 5

## 2011-01-08 MED ORDER — SULFAMETHOXAZOLE-TRIMETHOPRIM 800-160 MG PO TABS: 1.0000 | ORAL_TABLET | Freq: Two times a day (BID) | ORAL | Status: AC

## 2011-01-08 NOTE — Progress Notes (Signed)
Addended by: [place], Tkai Large on: 01/08/2011 02:52 PM   Modules accepted: Orders

## 2011-01-08 NOTE — Progress Notes (Signed)
  Subjective:    Patient ID: Allison Moore, female    DOB: Mar 23, 1981, 30 y.o.   MRN: [id number]  Urinary Tract Infection  This is a new problem. The current episode started in the past 7 days. The problem occurs every urination. The problem has been gradually worsening. The patient is experiencing no pain. There has been no fever. She is not sexually active. There is no history of pyelonephritis. Associated symptoms include flank pain and hesitancy. Pertinent negatives include no chills, discharge, frequency, hematuria or nausea. She has tried home medications for the symptoms. The treatment provided no relief. Her past medical history is significant for catheterization and recurrent UTIs. There is no history of kidney stones, a single kidney, urinary stasis or a urological procedure.  Pt. Has inability to void as main complaint. Her U/A is positive for UTI    Review of Systems  Constitutional: Negative for chills.  Gastrointestinal: Negative for nausea and abdominal distention.  Genitourinary: Positive for hesitancy, flank pain, decreased urine volume and difficulty urinating. Negative for dysuria, frequency, hematuria, vaginal bleeding, vaginal discharge and vaginal pain.  Musculoskeletal: Positive for back pain.  Skin: Negative for rash.       Objective:   Physical Exam  Abdominal: Soft. There is tenderness in the suprapubic area. There is CVA tenderness. There is no rigidity, no guarding, no tenderness at McBurney's point and negative Murphy's sign.         Tender area        Assessment & Plan:  Pt. Is a 30 y/o aaf with UTI 1. UTI with flank tenderness w/o fever - Will treat with 7 day course of Bactrim DS  2. She was informed to follow up sooner if she was retaining urine.

## 2011-01-08 NOTE — Patient Instructions (Signed)
It was nice seeing you. I hope your UTI resolves. If you have continued urinary retention please call the clinic or go to the ER.

## 2011-01-10 LAB — URINE CULTURE: Colony Count: NO GROWTH

## 2011-01-28 LAB — GC/CHLAMYDIA PROBE AMP, GENITAL
Chlamydia, DNA Probe: NEGATIVE
GC Probe Amp, Genital: NEGATIVE

## 2011-01-28 LAB — WET PREP, GENITAL
Trich, Wet Prep: NONE SEEN
Yeast Wet Prep HPF POC: NONE SEEN

## 2011-03-09 NOTE — Discharge Summary (Signed)
[name], Allison Moore NO.:  [id number]   MEDICAL RECORD NO.:  [id number]          PATIENT TYPE:  IPS   LOCATION:  0300                          FACILITY:  [facility]   PHYSICIAN:  Anselm Jungling, MD  DATE OF BIRTH:  January 07, 1981   DATE OF ADMISSION:  06/07/2007  DATE OF DISCHARGE:  06/14/2007                               DISCHARGE SUMMARY   IDENTIFYING DATA/REASON FOR ADMISSION:  This was an inpatient  psychiatric admission, for Allison Moore, a 30 year old single female who has  multiple sclerosis.  She presented with symptoms of acute psychosis.  Please refer to the admission note for further details pertaining to the  symptoms, circumstances and history that led to her hospitalization.   INITIAL DIAGNOSTIC IMPRESSION:  She was given initial AXIS I diagnosis  of psychosis not otherwise specified.   MEDICAL/LABORATORY:  As referenced above, the patient has a history of  multiple sclerosis.  Due to multiple sclerosis, she had difficulty  ambulating and was encouraged to use a wheelchair and/or walker during  her inpatient stay.  During much of her stay, she had a one-to-one  assistant and observer, due to both psychotic symptoms and the risk of  falling due to ambulatory instability.  There were no acute medical  issues during her inpatient stay.   HOSPITAL COURSE:  The patient was admitted to the adult inpatient  psychiatric service.  She presented as a well-developed woman who was  adequately nourished.  She was floridly psychotic, but pleasant and  cooperative.  Her thoughts were disorganized.   Over the following week, she was treated with a regimen of Zyprexa and  Depakote to stabilize her condition.  She had one-to-one observation  throughout this period.  Her parents were contacted and involved in her  care and discharge planning.  By the end of her hospital stay, she had  stabilized quite well, with good reality testing, and stable mood.   She was initially  evaluated and treated by Dr. Milford Cage.  The  undersigned assumed her care on the sixth hospital day.  On that day,  the patient was reporting some anxiety, showed blunted affect, vague  responses, and latency.  However, she reported that she felt her  medications were helping her feel a whole lot better.  Staff had noted  good improvement.   The following day, she stated I feel great.  Her one-to-one staff had  noted that she had had a brief episode of tearfulness that morning, but  the patient described this as tears of joy.  Sleep and appetite were  stable.  She was absent suicidal ideation.  Her one-to-one staff noted  that she had had some continued difficulties with ambulating and  transferring to and from the toilet without assistance.  At this time,  she appeared psychiatrically stable enough to go home, but we wanted to  make sure that she was stable enough physically and motorically to  manage at home.   On the eighth hospital day, she was ambulating well with her walker.  Her one-to-one staff noted that she was doing well, and did not appear  to be in jeopardy of falling.  I myself observed her to ambulate quite  well with her walker.  She had had a positive family meeting the night  before.  She apparently had not had any falls at home in the recent  past.  Her mother was contacted to discuss discharge to her home.   AFTERCARE:  The patient was to follow up with the [facility] with  an appointment on June 15, 2007 for psychiatric medication management.  She was also referred to [facility], with an appointment to see  Cleda Clarks on June 19, 2007.   DISCHARGE MEDICATIONS:  1. Zyprexa 5 mg q.a.m. and 10 mg q.h.s.  2. Depakote ER 250 mg b.i.d.   DISCHARGE DIAGNOSES:  AXIS I:  Schizoaffective disorder with psychotic  features, resolving.  AXIS II:  Deferred.  AXIS III:  History of multiple sclerosis.  AXIS IV:  Stressors:  Severe.  AXIS V:  GAF on  discharge 55.      Anselm Jungling, MD  Electronically Signed     SPB/MEDQ  D:  06/22/2007  T:  06/23/2007  Job:  [id number]

## 2011-03-12 NOTE — Discharge Summary (Signed)
NAMEAYANO, DOUTHITT NO.:  [id number]   MEDICAL RECORD NO.:  [id number]                   PATIENT TYPE:  INP   LOCATION:  3018                                 FACILITY:  [facility]   PHYSICIAN:  Gustavus Messing. Orlin Hilding, M.D.          DATE OF BIRTH:  [birth year]   DATE OF ADMISSION:  11/09/2003  DATE OF DISCHARGE:  11/18/2003                                 DISCHARGE SUMMARY   ADMISSION DIAGNOSIS:  Left-sided weakness and numbness unclear etiology.   DISCHARGE DIAGNOSIS:  Multiple sclerosis with acute exacerbation right  pontine lesion.   MEDICATIONS AT TIME OF TRANSFER:  1. Steroid taper. The patient was transferred to rehab.  2. Oral prednisone.  3. Tylenol 650 mg p.r.n.  4. Reglan 10 mg q.8h. p.r.n.  5. Laxative of choice p.r.n.  6. Restoril 50 mg p.o. q.h.s. p.r.n. insomnia.  7. Lipitor 10 mg daily.  8. Foltx once a day.  9. Aspirin 325 mg daily.  10.      Toradol p.r.n. for pain.  11.      Cepacol throat lozenges.   DISPOSITION:  The patient was transferred to rehab.   STUDIES:  Transesophageal echocardiogram which was essentially normal with  overall normal left ventricular systolic function, left ventricular ejection  fraction was 55 to 65%, left ventricular wall thickness was at upper limits  of normal, no regional wall abnormalities were identified.   EKG showed sinus bradycardia with possible first degree block and PACs.   CT of the brain showed multiple cerebral white matter hypodensities, unclear  etiology.   MRI showed multiple lesions consistent with MS including two enhancing  lesions in the pons on the right and the right cerebrum as well as many  older appearing lesions consistent with MS.  MRI scan of the cervical spine  was negative.  MRA was essentially normal.  There was question of short  segment of stenosis, distal left vertebral and I felt that that was  unlikely.   She had an ACE of the spinal fluid of less than 0.4.   CBC was essentially  normal.  Sed rate was 4.  Protime, INR and PTT were all normal.  Lupus  anticoagulant was elevated at 54.3 with Russell's viper venom confirmation.  The significance of that is uncertain and will be repeated as an outpatient.  Factor V Leiden mutation was negative.  Sickle cell hemoglobin  electrophoresis was unremarkable.  Her routine chemistries, CMET was normal.  Homocystine was elevated slightly at 15.82.  HDL was 24 which was low.  Admitting lipid panel was normal.  TSH 1.941.  B12 779, rbc folate 216,  ordinary folate was not checked.  Drug screen was positive for marijuana.  Spinal fluid did show oligoclonal bands.  ANA was negative.  Methylmalonic  acid was normal.   HOSPITAL COURSE:  Please refer to the history and physical for details.  Briefly, this is a 30 year old woman who presented after traveling  down from  [place] with a six day history of relatively sudden onset left-sided  numbness, weakness, and incoordination.  She was seen twice in the emergency  room in [place] and was turned away without resolution of her symptoms  and possibly felt to be psychological.  Eventually had a CT done there and  was told to follow up with a neurologist but because of continued  deterioration.  She called her mother who came up and got her and brought  her home and brought her to the emergency room.  She was found to have a  right sixth nerve palsy, a right seventh nerve palsy, a left hemiparesis,  and a left-sided clumsiness.  Multiple lesions on her MRI, some of which  enhanced quite suspicious for MS.  The only other differential was stroke.  Stroke work-up was largely negative except for an isolated positive lupus  anticoagulant of unclear etiology with a negative ANA.  She was put on  aspirin, prophylactically because of that and also put on Lipitor due to  minimal abnormalities of lipid profile.  The __________ spinal fluid came  back positive for  oligoclonal bands.  She was treated for multiple sclerosis  with IV Solu-Medrol for five days, prednisone taper and then was transferred  to rehab for further treatment.  Her condition was unchanged from admission.                                                Catherine A. Orlin Hilding, M.D.    CAW/MEDQ  D:  12/17/2003  T:  12/18/2003  Job:  [id number]

## 2011-03-12 NOTE — H&P (Signed)
[name], RASOOL NO.:  [id number]   MEDICAL RECORD NO.:  [id number]                   PATIENT TYPE:  EMS   LOCATION:  MAJO                                 FACILITY:  [facility]   PHYSICIAN:  Gustavus Messing. Orlin Hilding, M.D.          DATE OF BIRTH:  07/25/1981   DATE OF ADMISSION:  11/09/2003  DATE OF DISCHARGE:                                HISTORY & PHYSICAL   CHIEF COMPLAINT:  Left sided numbness, weakness and incoordination.   HISTORY OF PRESENT ILLNESS:  [name] is a 30 year old right handed,  black woman with no significant past medical history.  She states that six  days ago she had a relatively sudden onset of left sided numbness, weakness  and incoordination.  She lives in [place], [place].  After staying  home from work for a couple of days, hoping that her symptoms would go away,  when they did not, she eventually went to an emergency room [facility].  She states she was examined, had a CT scan of the brain which she was told  was normal and was sent out of the emergency room without resolution of her  symptoms or a diagnosis.  Her symptoms persisted.  The patient's mother, who  lives in [place], drove up to [place], got her and brought her back  home to [place] and then brought her into the emergency room tonight due  to continuing symptoms.  She has no previous similar symptoms except perhaps  some left leg problems in the past.  She has never had any episodes of  vision loss to suggest optic neuritis, double vision to suggest __________,  numbness from the waist down or chest down or incontinence of bladder to  suggest transverse myelitis, facial pains to suggest trigeminal neuralgia.  In short, nothing to suggest previous MS symptomatology.   REVIEW OF SYSTEMS:  She does complain of chest hardness but not pain, some  nausea and vomiting, intermittent headaches although none now, blurry  vision.  No fever.   PAST MEDICAL HISTORY:  1. Obesity.  2. Some minor GYN procedures.  She denies hypertension, diabetes, sickle cell disease,  hypercholesterolemia, heart disease.   MEDICATIONS:  1. Aleve.  2. Ibuprofen.   ALLERGIES:  No known drug allergies.   SOCIAL HISTORY:  She is single, lives in [place], [place].  She  smokes half a pack of cigarettes a day, occasional alcohol use, occasional  marijuana.  Denies cocaine.   FAMILY HISTORY:  Positive for hypertension.  No MS or stroke.  No sickle  cell disease.   PHYSICAL EXAMINATION:  VITAL SIGNS:  Temperature 97.5, pulse 88, blood  pressure 110/64, respirations 18, 97% sat.  GENERAL:  She is obese.  HEENT:  Head is normocephalic, atraumatic.  NECK:  Supple without bruits.  HEART:  Regular, rate and rhythm without murmurs.  LUNGS:  Clear to auscultation.  ABDOMEN:  Obese, soft, and nontender.  EXTREMITIES:  Without edema.  She has numerous tattoos.  NEUROLOGIC:  Mental status:  She is awake, alert and appropriate with normal  language and cognition.  Cranial nerves:  Pupils are equal and reactive.  Visual fields are full.  Disk margins are sharp.  Extraocular movements are  intact, but she has very __________ pursuits without _________ .  Some  nystagmus, right greater than left.  Facial sensation is diminished on the  left side, normal on the right.  Facial motor activity is essentially  normal.  I question a very subtle slight droop on the lower face on the  left.  Hearing is intact.  Palate is symmetric and tongue is midline.  On  the motor exam there is a definite left upper extremity pronator drift with  decreased rapid fine movement; however, there is no satellitism, and she has  good grips and strength in both upper extremities.  Lower extremities:  She  has normal strength on the right, 4/5 left psoas and quads distally on the  left is fairly normal.  Gait is mildly ataxic.  Tendon reflexes are  symmetric at 2+ with  downgoing toes.  Coordination:  Normal finger-to-nose,  heel-to-shin on the right.  Mild dysmetria of finger-to-nose, heel-to-shin  on the left.  She reports decreased sensation to multiple modalities on the  left body.   CT scan shows numerous bilateral somewhat subtle nonacute-looking white  matter lesions.   C-MET and CBC are unremarkable.  Alcohol is less than 5.  Urine drug screen  is pending.   IMPRESSION:  Left sided paresthesias, weakness and incoordination in a 30-  year-old with multiple bilateral white matter brain lesions by computed  tomography.  No risk factors for stroke.  Statistically, multiple sclerosis  would be favored over vascular etiology.   PLAN:  We will admit to a monitored bed.  Obtain MRI scan of the brain and  cervical spine, MR angiogram of the brain and neck, a 2D echo, coagulopathy  workup, vasculitis workup.  She may need an LP.  She may need angiogram.  We  will get PT, OT and ST evaluations.  Once we have a diagnosis, we can then  address treatment.                                                Catherine A. Orlin Hilding, M.D.    CAW/MEDQ  D:  11/09/2003  T:  11/09/2003  Job:  [id number]

## 2011-03-12 NOTE — Discharge Summary (Signed)
Allison Moore, Allison Moore NO.:  [id number]   MEDICAL RECORD NO.:  [id number]                   PATIENT TYPE:  IPS   LOCATION:  4037                                 FACILITY:  [facility]   PHYSICIAN:  Ellwood Dense, M.D.                DATE OF BIRTH:  1981-05-18   DATE OF ADMISSION:  11/18/2003  DATE OF DISCHARGE:  12/06/2003                                 DISCHARGE SUMMARY   DISCHARGE DIAGNOSES:  1. Multiple sclerosis, new diagnosis, with left hemiparesis, resolving.  2. Dyslipidemia.  3. Obesity.   HISTORY OF PRESENT ILLNESS:  Allison Moore is a 30 year old female who is in  relatively good health, who developed left-sided weakness, numbness and  incoordination 6 days prior to admission.  She lived in [place] and was  discharged from ER there and brought by a family member to [facility] ED, as  symptoms continued.  CCT done showed numerous bilateral septal non-acute  white matter lesions.  She was evaluated by Dr. Gustavus Messing. [facility] for  demyelinating versus vascular process and MRI/MRA done showed multiple MS  lesions, bilateral short segments, with stenosis, distal left vertebral, no  cervical spinal cord lesions.  She was treated with Solu-Medrol x5 days and  changed to p.o. steroids.  Other workup revealed elevated lupus ANA at 54.3,  homocysteine elevated at 1622.  Hemoglobin electrophoresis negative for SS.  Cholesterol panel showed low HDL and she was started on aspirin and Lipitor  and Foltx.  __________ showed positive oligoclonal bands with increased  lymphs and increased glucose.  The patient continued with left hemiparesis,  right peripheral 6th and 7th nerve affected, left hemiparesis secondary to  acute right pontine lesion.  Right cranial nerve VI is getting better.  PT,  OT and speech therapy initiated.  Patient at min-assist for transfers, mod-  assist for ambulating 25 feet with hemi-walker and was noted to have poor  ambulatory  control.   PAST MEDICAL HISTORY:  Past medical history is significant for left hip  pinning.   ALLERGIES:  No known drug allergies.   SOCIAL HISTORY:  The patient was living with boyfriend in [place], was  independent and had started a new job prior to admission.  Plans therefore  are to be discharged to mother's home in [place].  She was smoking a half  a pack per day with occasional marijuana, occasional alcohol use.   HOSPITAL COURSE:  Allison Moore was admitted to rehab on November 19, 2003 for inpatient therapies to consist of PT and OT daily.  Subcu  Lovenox was continued for DVT prophylaxis.  Prednisone was slowly tapered  off during her stay.  Followup labs past admission showed mild hypokalemia  with potassium of 3.4 and this was supplemented.  A check of CBC showed mild  leukocytosis, probably due to steroids, with hemoglobin of 14.3, hematocrit  41.4, white count 12.2, platelets 318,000.  She showed mild  elevation in  LFTs, AST at 57, ALT of 307.  Followup of electrolytes of November 24, 2003  showed sodium 138, potassium 4.4, chloride 104, CO2 27, BUN 16, creatinine  0.7, glucose 92.  The patient has had some symptoms of dysuria and UA and  urine culture were done on November 29, 2003 and December 04, 2003, both of  which have been negative.  PVRs done showed no signs of retention.  Dr.  Gladstone Pih, neuropsychiatrist, was consulted for input and support.  He felt with patient with adjustment reaction and has been following for  supportive counseling, supportive counseling also as the patient had a  family member who passed away during her stay on rehab.  He felt that the  thought process was coherent and organized.  Speech was mildly dysarthric,  however, the patient denied any symptoms of despair, hopelessness or  thoughts of death.  The patient was started on Betaseron on November 25, 2003.  Family and patient were concerned about side-effects of  Betaseron as  well as full efficacy of Betaseron and need for Betaseron for treatment.  They have had multiple discussions with Dr. Orlin Hilding regarding this and they  have agreed to continue with this.  The patient was signed up with Betaseron  assistance program and will continue on this every other day past discharge.   During her stay in rehab, the patient progressed to being at supervision to  occasional min instructions to cuing for bed mobility as she continues to  overuse her right side, however, she had begun to demonstrate increased use  of left side during mobility.  She was at supervision for sit, stand, pivot  and car transfers.  She was at close supervision using a short-based quad  cane and left AFO for ambulating 150 feet on level surfaces.  She was able  to navigate 4-6 steps with supervision.  She required setup for upper body  care, close supervision for lower body care, min-assist for donning AFO.  Patient's diplopia is much improved with patching.  Speech is clear with  basic high-level comprehension intact and basic high-level expression  intact.  Minimal dysarthria continues.  Dr. Leonides Cave, who has followed the  patient along, felt that the patient was observed to have mild deficit with  therapy with formal neuropsycho-cognitive testing to be done at a later  time, especially as she prepares to resume employment.  Followup counseling  sessions were set up in about 2 weeks.  On December 06, 2003, the patient is  discharged to home.   DISCHARGE MEDICATIONS:  1. Coated aspirin 25 mg a day.  2. Pepcid 20 mg a day.  3. Lipitor 10 mg nightly.  4. Foltx 1 per day.  5. Senokot-S 2 nightly.  6. Betaseron 0.25 mL every other day starting Sunday through February 14,     20 05, may increase to 0.5 mL every other day, December 11, 2003 to     December 23, 2003, then increase to 0.75 mL every other day, December 25, 2003 to January 06, 2004, then back to 0.25 mL subcu daily.  7.  Ibuprofen 400 mg with Tylenol 650 mg after Betaseron.   ACTIVITY:  Supervision, use cane for walking.   SPECIAL INSTRUCTIONS:  No alcohol, no smoking, no driving.  Outpatient PT  and OT beginning at [facility], December 12, 2003.   FOLLOWUP:  Patient to follow up with Dr. Orlin Hilding in 2 weeks, follow up  with  Dr. Ellwood Dense in 4-6, follow up with Dr. Alvira Philips, December 23, 2003 at 3:05 p.m.      Greg Cutter, P.A.                    Ellwood Dense, M.D.    PP/MEDQ  D:  01/03/2004  T:  01/06/2004  Job:  [id number]   cc:   Santina Evans A. Orlin Hilding, M.D.  [address]  [address]  [place]  [place] [address]  Fax: [phone]   Alvira Philips, M.D.  [facility].  [address] Resident  [place]  [place] [address]  Fax: [phone]
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
## 2011-03-12 NOTE — Op Note (Signed)
NAMEKIMBERLYE, DILGER NO.:  [id number]   MEDICAL RECORD NO.:  [id number]          PATIENT TYPE:  INP   LOCATION:  9123                          FACILITY:  [facility]   PHYSICIAN:  Ginger Carne, MD  DATE OF BIRTH:  1981/08/30   DATE OF PROCEDURE:  08/15/2006  DATE OF DISCHARGE:                                 OPERATIVE REPORT   PREOPERATIVE DIAGNOSIS:  Intrauterine pregnancy at term.  Nonreassuring  fetal heart tracing.  History of multiple sclerosis.   POSTOPERATIVE DIAGNOSIS:  Intrauterine pregnancy at term.  Nonreassuring  fetal heart tracing.  History of multiple sclerosis.   OPERATION PERFORMED:  Primary low transverse cesarean section.   SURGEON:  Ginger Carne, MD   ASSISTANT:  Paticia Stack, MD   ANESTHESIA:  Spinal.   SPECIMENS:  Placenta to L&D.   ESTIMATED BLOOD LOSS:  1000 mL.   COMPLICATIONS:  None.   FINDINGS:  Viable female infant, birth weight 7 pounds 2 ounces, Apgars 9 and  5 at one and five minutes, respectively.   INDICATIONS FOR PROCEDURE:  This is a 30 year old gravida 1 para 0 at 67 and  2/[redacted] weeks gestation who presented with spontaneous rupture of membranes, was  started on Pitocin, got dilated to 5 cm, has been at 5 cm for about four  hours.  Fetus developed nonreassuring fetal heart tracing and patient taken  to cesarean section.   DESCRIPTION OF PROCEDURE:  The patient was taken to the operating room where  spinal anesthesia was found to be adequate.  She was then prepped and draped  in the normal sterile fashion in dorsal supine position with a leftward  tilt.  A pfannensteil incision was then made with a scalpel and carried  through to the underlying layer of fascia.  The fascia was incised in the  midline and the incision extended laterally with the Mayo scissors.  The  superior aspect of the fascial incision was then grasped with the Kocher  clamp and elevated and the underlying rectus muscle was dissected off  bluntly.  Attention was then turned to the inferior aspect of the incision  which in a similar fashion was grasped, tented up with a Kocher clamp and  the rectus muscle was dissected off bluntly.  The rectus muscles were  separated in the midline and the peritoneum identified, tented up and  entered sharply with the Metzenbaum scissors.  The right rectus muscles were  separated to provide more room for the baby.  The peritoneal incision was  then extended superiorly and inferiorly with good visualization of the  bladder.  The bladder blade was then inserted and the vesicouterine  peritoneum identified grasped with pickups and entered sharply with  Metzenbaum scissors.  The incision was then extended laterally and the  bladder flap created digitally.  Loew uterine incosion incised transversely.  The bladder blade was removed and the infant's head delivered via vacuum  atraumatically.  The nose and mouth were suctioned with the bulb and the  cord clamped and cut.  The infant was handed off to the waiting  pediatrician.  Cord blood was  obtained. The placenta was then removed  manually, the uterus exteriorized and cleared of all clots and debris.  The  uterine incision was then repaired with 1-0 chromic in a running locked  fashion.  Pressure was applied to the uterine incision with good hemostasis.  The uterus was returned to the abdomen and the uterine incision was noted to  have good hemostasis.  The gutters were cleared of all clots and the fascia  was reapproximated using 0 PDS suture done in a running fashion.  The skin  was closed with staples.  The patient tolerated the procedure well.  Sponge,  lap, needle instruments were correct times two.  She was then taken to  recovery room in stable condition.     ______________________________  Paticia Stack, MD      Ginger Carne, MD  Electronically Signed    LNJ/MEDQ  D:  08/15/2006  T:  08/16/2006  Job:  [id number]

## 2011-04-26 ENCOUNTER — Ambulatory Visit (INDEPENDENT_AMBULATORY_CARE_PROVIDER_SITE_OTHER): Payer: Medicare Other | Admitting: *Deleted

## 2011-04-26 DIAGNOSIS — Z111 Encounter for screening for respiratory tuberculosis: Secondary | ICD-10-CM

## 2011-04-29 ENCOUNTER — Ambulatory Visit (INDEPENDENT_AMBULATORY_CARE_PROVIDER_SITE_OTHER): Payer: Medicare Other | Admitting: *Deleted

## 2011-04-29 DIAGNOSIS — IMO0001 Reserved for inherently not codable concepts without codable children: Secondary | ICD-10-CM

## 2011-04-29 DIAGNOSIS — Z111 Encounter for screening for respiratory tuberculosis: Secondary | ICD-10-CM

## 2011-04-29 LAB — TB SKIN TEST: Induration: 0

## 2011-04-29 NOTE — Progress Notes (Signed)
PPD negative-0 mm. 

## 2011-05-05 ENCOUNTER — Telehealth: Payer: Self-pay | Admitting: Family Medicine

## 2011-05-05 NOTE — Telephone Encounter (Signed)
Needs TB results faxed to [phone].

## 2011-05-05 NOTE — Telephone Encounter (Signed)
Pt calling back, says they never received it, please refax.

## 2011-05-05 NOTE — Telephone Encounter (Signed)
Results faxed to number provided.

## 2011-05-05 NOTE — Telephone Encounter (Signed)
Info refaxed

## 2011-08-03 ENCOUNTER — Ambulatory Visit (INDEPENDENT_AMBULATORY_CARE_PROVIDER_SITE_OTHER): Payer: Medicare Other | Admitting: Family Medicine

## 2011-08-03 ENCOUNTER — Encounter: Payer: Self-pay | Admitting: Family Medicine

## 2011-08-03 DIAGNOSIS — T8339XA Other mechanical complication of intrauterine contraceptive device, initial encounter: Secondary | ICD-10-CM

## 2011-08-03 NOTE — Assessment & Plan Note (Signed)
Pt decided she did not want IUD removed right now.  I told her that her last US showed that it was there, but it might not be there now.  She denied taking any psych meds, but her pharmacy records show that she is picking them up.  If she returns for this complaint, she will need referral for GYN to remove and counseling to talk to her psych doctor about her meds.

## 2011-08-03 NOTE — Progress Notes (Signed)
  Subjective:    Patient ID: [name], female    DOB: 08/24/1981, 30 y.o.   MRN: [id number]  HPI Pt presents to have IUD removed.  She had a visit for well woman in December and strings not visualized.  She had an u/s at that time showing the IUD in place.  Today she desires to have children so wants it removed.   She denies taking any medications.    Review of Systems Denies CP, SOB, HA, N/V/D, fever     Objective:   Physical Exam  Vital signs reviewed General appearance - alert, well appearing, and in no distress and oriented to person, place, and time GYN- strings not visualized.      Assessment & Plan:  IUD MALFUNCTION Pt decided she did not want IUD removed right now.  I told her that her last US showed that it was there, but it might not be there now.  She denied taking any psych meds, but her pharmacy records show that she is picking them up.  If she returns for this complaint, she will need referral for GYN to remove and counseling to talk to her psych doctor about her meds.

## 2011-08-05 LAB — URINALYSIS, ROUTINE W REFLEX MICROSCOPIC
Glucose, UA: NEGATIVE
Protein, ur: NEGATIVE
Specific Gravity, Urine: 1.018
pH: 6.5

## 2011-08-05 LAB — DIFFERENTIAL
Basophils Absolute: 0.1
Basophils Relative: 1
Eosinophils Absolute: 0.1
Eosinophils Relative: 2
Lymphocytes Relative: 41
Lymphs Abs: 2.3
Monocytes Absolute: 0.4
Monocytes Relative: 6
Neutro Abs: 2.8
Neutrophils Relative %: 50

## 2011-08-05 LAB — BASIC METABOLIC PANEL WITH GFR
BUN: 10
CO2: 27
Calcium: 9
Chloride: 107
Creatinine, Ser: 0.65
GFR calc Af Amer: >60
GFR calc non Af Amer: >60
Glucose, Bld: 84
Potassium: 4
Sodium: 139

## 2011-08-05 LAB — RAPID URINE DRUG SCREEN, HOSP PERFORMED
Amphetamines: NOT DETECTED
Barbiturates: NOT DETECTED
Benzodiazepines: NOT DETECTED
Cocaine: NOT DETECTED
Opiates: NOT DETECTED
Tetrahydrocannabinol: POSITIVE — AB

## 2011-08-05 LAB — URINE MICROSCOPIC-ADD ON

## 2011-08-05 LAB — CBC
MCV: 84.6
RBC: 4.14
WBC: 5.7

## 2011-08-05 LAB — ETHANOL: Alcohol, Ethyl (B): <5

## 2011-08-05 LAB — PREGNANCY, URINE: Preg Test, Ur: NEGATIVE

## 2011-08-05 LAB — VALPROIC ACID LEVEL: Valproic Acid Lvl: 21.3 — ABNORMAL LOW

## 2011-08-09 LAB — URINALYSIS, ROUTINE W REFLEX MICROSCOPIC
Bilirubin Urine: NEGATIVE
Glucose, UA: NEGATIVE
Ketones, ur: 15 — AB
Protein, ur: 30 — AB
Urobilinogen, UA: 0.2

## 2011-08-09 LAB — RAPID URINE DRUG SCREEN, HOSP PERFORMED
Amphetamines: NOT DETECTED
Barbiturates: NOT DETECTED
Cocaine: NOT DETECTED
Opiates: NOT DETECTED
Tetrahydrocannabinol: POSITIVE — AB

## 2011-08-09 LAB — DIFFERENTIAL
Basophils Absolute: 0.1
Lymphocytes Relative: 38
Monocytes Relative: 7
Neutro Abs: 4.3

## 2011-08-09 LAB — URINE MICROSCOPIC-ADD ON

## 2011-08-09 LAB — COMPREHENSIVE METABOLIC PANEL
AST: 16
Albumin: 4
Calcium: 9.3
Chloride: 106
Creatinine, Ser: 0.68
GFR calc Af Amer: >60
Total Protein: 6.9

## 2011-08-09 LAB — ETHANOL: Alcohol, Ethyl (B): <5

## 2011-08-09 LAB — CBC
Hemoglobin: 12.3
RDW: 15.3 — ABNORMAL HIGH
WBC: 8

## 2011-08-09 LAB — ACETAMINOPHEN LEVEL: Acetaminophen (Tylenol), Serum: <10 — ABNORMAL LOW

## 2011-08-11 ENCOUNTER — Ambulatory Visit (INDEPENDENT_AMBULATORY_CARE_PROVIDER_SITE_OTHER): Payer: Medicare Other | Admitting: Family Medicine

## 2011-08-11 DIAGNOSIS — T8339XA Other mechanical complication of intrauterine contraceptive device, initial encounter: Secondary | ICD-10-CM

## 2011-08-11 NOTE — Patient Instructions (Signed)
Your Ultrasound appt is 2:30 pm on 10/23

## 2011-08-11 NOTE — Progress Notes (Signed)
  Subjective:    Patient ID: Allison Moore, female    DOB: [birth year], 30 y.o.   MRN: [id number]  HPI  Pt changed her mind and now wants to pursue IUD removal.  She agrees with plan of [place] and GYN referral  Review of Systems     Objective:   Physical Exam        Assessment & Plan:

## 2011-08-17 ENCOUNTER — Telehealth: Payer: Self-pay | Admitting: Family Medicine

## 2011-08-17 ENCOUNTER — Ambulatory Visit (HOSPITAL_COMMUNITY)
Admission: RE | Admit: 2011-08-17 | Discharge: 2011-08-17 | Disposition: A | Discharge status: Home / Self Care | Payer: Medicare Other | Source: Ambulatory Visit | Attending: Family Medicine | Admitting: Family Medicine

## 2011-08-17 DIAGNOSIS — Z30431 Encounter for routine checking of intrauterine contraceptive device: Secondary | ICD-10-CM | POA: Insufficient documentation

## 2011-08-17 DIAGNOSIS — T8339XA Other mechanical complication of intrauterine contraceptive device, initial encounter: Secondary | ICD-10-CM

## 2011-08-17 NOTE — Telephone Encounter (Signed)
Pt had [place] today and they found her IUD - wants to know what to do now.

## 2011-08-18 NOTE — Telephone Encounter (Signed)
No string.  Needs referral to GYN.

## 2011-08-18 NOTE — Telephone Encounter (Signed)
Left message on vm informing patient that referral was re-faxed to [facility], and once they set her up with an appointment I would give her a call.

## 2011-09-03 ENCOUNTER — Emergency Department (HOSPITAL_COMMUNITY): Payer: Medicare Other

## 2011-09-03 ENCOUNTER — Encounter (HOSPITAL_COMMUNITY): Payer: Self-pay | Admitting: Emergency Medicine

## 2011-09-03 ENCOUNTER — Inpatient Hospital Stay (HOSPITAL_COMMUNITY)
Admission: EM | Admit: 2011-09-03 | Discharge: 2011-09-13 | DRG: 958 | Disposition: A | Discharge status: Skilled Nursing Facility | Payer: Medicare Other | Source: Ambulatory Visit | Attending: General Surgery | Admitting: General Surgery

## 2011-09-03 DIAGNOSIS — S060XAA Concussion with loss of consciousness status unknown, initial encounter: Secondary | ICD-10-CM

## 2011-09-03 DIAGNOSIS — Y92410 Unspecified street and highway as the place of occurrence of the external cause: Secondary | ICD-10-CM

## 2011-09-03 DIAGNOSIS — S329XXA Fracture of unspecified parts of lumbosacral spine and pelvis, initial encounter for closed fracture: Secondary | ICD-10-CM | POA: Diagnosis present

## 2011-09-03 DIAGNOSIS — T07XXXA Unspecified multiple injuries, initial encounter: Secondary | ICD-10-CM | POA: Diagnosis present

## 2011-09-03 DIAGNOSIS — S8263XA Displaced fracture of lateral malleolus of unspecified fibula, initial encounter for closed fracture: Secondary | ICD-10-CM | POA: Diagnosis present

## 2011-09-03 DIAGNOSIS — S82109A Unspecified fracture of upper end of unspecified tibia, initial encounter for closed fracture: Principal | ICD-10-CM | POA: Diagnosis present

## 2011-09-03 DIAGNOSIS — Z79899 Other long term (current) drug therapy: Secondary | ICD-10-CM

## 2011-09-03 DIAGNOSIS — E669 Obesity, unspecified: Secondary | ICD-10-CM | POA: Diagnosis present

## 2011-09-03 DIAGNOSIS — Y9301 Activity, walking, marching and hiking: Secondary | ICD-10-CM

## 2011-09-03 DIAGNOSIS — S32810A Multiple fractures of pelvis with stable disruption of pelvic ring, initial encounter for closed fracture: Secondary | ICD-10-CM | POA: Diagnosis present

## 2011-09-03 DIAGNOSIS — IMO0002 Reserved for concepts with insufficient information to code with codable children: Secondary | ICD-10-CM

## 2011-09-03 DIAGNOSIS — F17200 Nicotine dependence, unspecified, uncomplicated: Secondary | ICD-10-CM | POA: Diagnosis present

## 2011-09-03 DIAGNOSIS — S82143A Displaced bicondylar fracture of unspecified tibia, initial encounter for closed fracture: Secondary | ICD-10-CM | POA: Diagnosis present

## 2011-09-03 DIAGNOSIS — K5900 Constipation, unspecified: Secondary | ICD-10-CM | POA: Diagnosis not present

## 2011-09-03 DIAGNOSIS — F259 Schizoaffective disorder, unspecified: Secondary | ICD-10-CM | POA: Diagnosis present

## 2011-09-03 DIAGNOSIS — S0003XA Contusion of scalp, initial encounter: Secondary | ICD-10-CM | POA: Diagnosis present

## 2011-09-03 DIAGNOSIS — D62 Acute posthemorrhagic anemia: Secondary | ICD-10-CM | POA: Diagnosis not present

## 2011-09-03 DIAGNOSIS — S1093XA Contusion of unspecified part of neck, initial encounter: Secondary | ICD-10-CM | POA: Diagnosis present

## 2011-09-03 DIAGNOSIS — S060X9A Concussion with loss of consciousness of unspecified duration, initial encounter: Secondary | ICD-10-CM

## 2011-09-03 DIAGNOSIS — E785 Hyperlipidemia, unspecified: Secondary | ICD-10-CM | POA: Diagnosis present

## 2011-09-03 DIAGNOSIS — S82209A Unspecified fracture of shaft of unspecified tibia, initial encounter for closed fracture: Secondary | ICD-10-CM

## 2011-09-03 DIAGNOSIS — F319 Bipolar disorder, unspecified: Secondary | ICD-10-CM | POA: Diagnosis present

## 2011-09-03 DIAGNOSIS — S3210XA Unspecified fracture of sacrum, initial encounter for closed fracture: Secondary | ICD-10-CM | POA: Diagnosis present

## 2011-09-03 DIAGNOSIS — G35 Multiple sclerosis: Secondary | ICD-10-CM | POA: Diagnosis present

## 2011-09-03 HISTORY — DX: Acute posthemorrhagic anemia: D62

## 2011-09-03 HISTORY — DX: Mental disorder, not otherwise specified: F99

## 2011-09-03 HISTORY — DX: Nausea with vomiting, unspecified: R11.2

## 2011-09-03 HISTORY — DX: Displaced bicondylar fracture of unspecified tibia, initial encounter for closed fracture: S82.143A

## 2011-09-03 HISTORY — DX: Other specified postprocedural states: Z98.890

## 2011-09-03 HISTORY — DX: Headache: R51

## 2011-09-03 HISTORY — DX: Anxiety disorder, unspecified: F41.9

## 2011-09-03 HISTORY — DX: Multiple sclerosis: G35

## 2011-09-03 LAB — COMPREHENSIVE METABOLIC PANEL
Alkaline Phosphatase: 71 U/L (ref 39–117)
BUN: 17 mg/dL (ref 6–23)
CO2: 26 mEq/L (ref 19–32)
Chloride: 104 mEq/L (ref 96–112)
GFR calc Af Amer: >90 mL/min (ref >90)
GFR calc non Af Amer: >90 mL/min (ref >90)
Glucose, Bld: 114 mg/dL — ABNORMAL HIGH (ref 70–99)
Potassium: 3.3 mEq/L — ABNORMAL LOW (ref 3.5–5.1)
Total Bilirubin: 0.2 mg/dL — ABNORMAL LOW (ref 0.3–1.2)
Total Protein: 7.3 g/dL (ref 6.0–8.3)

## 2011-09-03 LAB — URINALYSIS, ROUTINE W REFLEX MICROSCOPIC
Bilirubin Urine: NEGATIVE
Glucose, UA: NEGATIVE mg/dL
Ketones, ur: 15 mg/dL — AB
Leukocytes, UA: NEGATIVE
Specific Gravity, Urine: 1.031 — ABNORMAL HIGH (ref 1.005–1.030)
pH: 5.5 (ref 5.0–8.0)

## 2011-09-03 LAB — CBC
HCT: 39.9 % (ref 36.0–46.0)
Hemoglobin: 13.7 g/dL (ref 12.0–15.0)
MCHC: 34.3 g/dL (ref 30.0–36.0)
RBC: 4.55 MIL/uL (ref 3.87–5.11)

## 2011-09-03 LAB — URINE MICROSCOPIC-ADD ON

## 2011-09-03 LAB — ETHANOL: Alcohol, Ethyl (B): <11 mg/dL (ref 0–11)

## 2011-09-03 MED ORDER — ONDANSETRON HCL 4 MG/2ML IJ SOLN
4.0000 mg | Freq: Once | INTRAMUSCULAR | Status: AC
  Administered 2011-09-03: 4 mg via INTRAVENOUS
  Filled 2011-09-03: qty 2

## 2011-09-03 MED ORDER — MORPHINE SULFATE 4 MG/ML IJ SOLN
4.0000 mg | Freq: Once | INTRAMUSCULAR | Status: AC
  Administered 2011-09-03: 4 mg via INTRAVENOUS
  Filled 2011-09-03: qty 1

## 2011-09-03 MED ORDER — HYDROMORPHONE HCL PF 1 MG/ML IJ SOLN
1.0000 mg | Freq: Once | INTRAMUSCULAR | Status: AC
  Administered 2011-09-03: 1 mg via INTRAVENOUS
  Filled 2011-09-03: qty 1

## 2011-09-03 MED ORDER — TETANUS-DIPHTH-ACELL PERTUSSIS 5-2.5-18.5 LF-MCG/0.5 IM SUSP
0.5000 mL | Freq: Once | INTRAMUSCULAR | Status: AC
  Administered 2011-09-04: 0.5 mL via INTRAMUSCULAR
  Filled 2011-09-03: qty 0.5

## 2011-09-03 NOTE — ED Notes (Signed)
Knee immobilizer put in place by ortho MD. Pt tolerated well.

## 2011-09-03 NOTE — ED Notes (Signed)
Pt to xray for further studies.

## 2011-09-03 NOTE — ED Notes (Signed)
Urine specimen obtained; pt made comfortable; warm blankets provided for pt.

## 2011-09-03 NOTE — ED Notes (Signed)
Rn to accompany to xray and ct at this time.

## 2011-09-03 NOTE — ED Notes (Signed)
Pt back from xray. RN assisted her in calling family.
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
## 2011-09-03 NOTE — ED Notes (Signed)
Trauma MD at bedside to assess
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
## 2011-09-03 NOTE — ED Notes (Signed)
Pt still anxious. RN and techs talking to her to keep her calm.
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
## 2011-09-03 NOTE — ED Notes (Signed)
Pharmacy tech at bedside getting home meds; xray called. Pt to go shortly for scans.

## 2011-09-03 NOTE — ED Notes (Signed)
Mom at bedside; pt returned from xray/CT. VSS; no signs of distress.
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
## 2011-09-03 NOTE — Progress Notes (Signed)
Orthopedic Tech Progress Note Patient Details:  Allison Moore [birth year] [id number]  Other Ortho Devices Type of Ortho Device: Knee Immobilizer Ortho Device Location: (R) LE Ortho Device Interventions: Application   Jennye Moccasin 09/03/2011, 9:44 PM

## 2011-09-03 NOTE — ED Notes (Signed)
Vital signs stable. 
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
## 2011-09-04 ENCOUNTER — Inpatient Hospital Stay (HOSPITAL_COMMUNITY): Payer: Medicare Other

## 2011-09-04 ENCOUNTER — Encounter (HOSPITAL_COMMUNITY): Payer: Self-pay

## 2011-09-04 LAB — CBC
Hemoglobin: 11.5 g/dL — ABNORMAL LOW (ref 12.0–15.0)
MCH: 29 pg (ref 26.0–34.0)
RBC: 3.96 MIL/uL (ref 3.87–5.11)
WBC: 6.3 10*3/uL (ref 4.0–10.5)

## 2011-09-04 LAB — BASIC METABOLIC PANEL
GFR calc non Af Amer: >90 mL/min (ref >90)
Glucose, Bld: 122 mg/dL — ABNORMAL HIGH (ref 70–99)
Potassium: 3.9 mEq/L (ref 3.5–5.1)
Sodium: 136 mEq/L (ref 135–145)

## 2011-09-04 MED ORDER — GLATIRAMER ACETATE 20 MG/ML ~~LOC~~ KIT
20.0000 mg | PACK | Freq: Every day | SUBCUTANEOUS | Status: DC
  Administered 2011-09-05 – 2011-09-13 (×8): 20 mg via SUBCUTANEOUS
  Filled 2011-09-04 (×4): qty 1

## 2011-09-04 MED ORDER — DOCUSATE SODIUM 100 MG PO CAPS
100.0000 mg | ORAL_CAPSULE | Freq: Two times a day (BID) | ORAL | Status: DC
  Administered 2011-09-04 – 2011-09-07 (×7): 100 mg via ORAL
  Filled 2011-09-04 (×11): qty 1

## 2011-09-04 MED ORDER — PANTOPRAZOLE SODIUM 40 MG IV SOLR
40.0000 mg | Freq: Every day | INTRAVENOUS | Status: DC
  Filled 2011-09-04 (×4): qty 40

## 2011-09-04 MED ORDER — HYDROMORPHONE HCL PF 1 MG/ML IJ SOLN
1.0000 mg | INTRAMUSCULAR | Status: DC | PRN
  Administered 2011-09-04 (×2): 1 mg via INTRAVENOUS
  Filled 2011-09-04 (×3): qty 1

## 2011-09-04 MED ORDER — ONDANSETRON HCL 4 MG/2ML IJ SOLN: 4.0000 mg | Freq: Four times a day (QID) | INTRAMUSCULAR | Status: DC | PRN

## 2011-09-04 MED ORDER — SERTRALINE HCL 50 MG PO TABS
50.0000 mg | ORAL_TABLET | Freq: Every day | ORAL | Status: DC
  Administered 2011-09-05 – 2011-09-13 (×9): 50 mg via ORAL
  Filled 2011-09-04 (×10): qty 1

## 2011-09-04 MED ORDER — HYDROCODONE-ACETAMINOPHEN 5-325 MG PO TABS: 0.5000 | ORAL_TABLET | ORAL | Status: DC | PRN

## 2011-09-04 MED ORDER — HYDROCODONE-ACETAMINOPHEN 5-325 MG PO TABS
1.0000 | ORAL_TABLET | ORAL | Status: DC | PRN
  Administered 2011-09-04 (×2): 1 via ORAL
  Filled 2011-09-04 (×2): qty 1

## 2011-09-04 MED ORDER — HYDROCODONE-ACETAMINOPHEN 5-325 MG PO TABS
2.0000 | ORAL_TABLET | ORAL | Status: DC | PRN
  Administered 2011-09-04 – 2011-09-06 (×4): 2 via ORAL
  Filled 2011-09-04 (×5): qty 2

## 2011-09-04 MED ORDER — ONDANSETRON HCL 4 MG PO TABS: 4.0000 mg | ORAL_TABLET | Freq: Four times a day (QID) | ORAL | Status: DC | PRN

## 2011-09-04 MED ORDER — POTASSIUM CHLORIDE 2 MEQ/ML IV SOLN
INTRAVENOUS | Status: DC
  Administered 2011-09-04 – 2011-09-06 (×3): via INTRAVENOUS
  Filled 2011-09-04 (×7): qty 1000

## 2011-09-04 MED ORDER — HYDROMORPHONE HCL PF 1 MG/ML IJ SOLN
0.5000 mg | INTRAMUSCULAR | Status: DC | PRN
  Administered 2011-09-04: 2 mg via INTRAVENOUS
  Administered 2011-09-04: 0.5 mg via INTRAVENOUS
  Administered 2011-09-05 – 2011-09-06 (×4): 1 mg via INTRAVENOUS
  Administered 2011-09-07: 2 mg via INTRAVENOUS
  Filled 2011-09-04 (×4): qty 1
  Filled 2011-09-04: qty 2
  Filled 2011-09-04: qty 1
  Filled 2011-09-04: qty 2
  Filled 2011-09-04: qty 1

## 2011-09-04 MED ORDER — PANTOPRAZOLE SODIUM 40 MG PO TBEC
40.0000 mg | DELAYED_RELEASE_TABLET | Freq: Every day | ORAL | Status: DC
  Administered 2011-09-04 – 2011-09-12 (×9): 40 mg via ORAL
  Filled 2011-09-04 (×10): qty 1

## 2011-09-04 NOTE — Progress Notes (Addendum)
Pt takes Zoloft and Copaxone at home - order received from Trauma to release those 2 meds on home list for pt to receive. Patient with Psych. symptoms.
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
## 2011-09-04 NOTE — Progress Notes (Signed)
Allison Moore  MRN: [id number] DOB/Age: October 15, 1981 30 y.o. Physician: Jacquelyne Balint Procedure:       Subjective: C/O severe pain mostly to Right Knee. Left ankle sore.    Vital Signs Temp:  [98.6 F (37 C)-99.8 F (37.7 C)] 99.8 F (37.7 C) (11/10 0551) Pulse Rate:  [93-114] 108  (11/10 0551) Resp:  [18-28] 18  (11/10 0551) BP: (96-144)/(47-119) 134/69 mmHg (11/10 0551) SpO2:  [98 %-100 %] 98 % (11/10 0551)  Lab Results  Basename 09/04/11 0735 09/03/11 1950  WBC 6.3 12.3*  HGB 11.5* 13.7  HCT 35.1* 39.9  PLT 233 296   BMET  Basename 09/04/11 0735 09/03/11 1950  NA 136 141  K 3.9 3.3*  CL 104 104  CO2 25 26  GLUCOSE 122* 114*  BUN 15 17  CREATININE 0.66 0.79  CALCIUM 8.6 9.4   No results found for this basename: inr     Exam Right knee in jones dressing. Good pulses distally. NVI right LE Left ankle with moderate swelling and superficial abrasions to lateral ankle. Tender to palpation on distal fibula.         Plan R Tibial Plateu Fx L distal fibula avulsion fx/ ankle sprain Stable pelvic fx  Plan: Dr Carola Frost and Mellody Dance to see tomorrow re: definitive management of her r tibial plateu. Will have ortho tech fit with an ASO ankle brace on Left which will make it easier to ambulate on given her right LE injury.  Santhosh Gulino for Dr.Kevin Supple 09/04/2011, 12:25 PM

## 2011-09-05 ENCOUNTER — Inpatient Hospital Stay (HOSPITAL_COMMUNITY): Payer: Medicare Other

## 2011-09-05 MED ORDER — TIZANIDINE HCL 4 MG PO TABS
4.0000 mg | ORAL_TABLET | Freq: Three times a day (TID) | ORAL | Status: DC | PRN
  Administered 2011-09-11 – 2011-09-12 (×2): 4 mg via ORAL
  Filled 2011-09-05 (×3): qty 1

## 2011-09-05 MED ORDER — ENOXAPARIN SODIUM 30 MG/0.3ML ~~LOC~~ SOLN
30.0000 mg | Freq: Two times a day (BID) | SUBCUTANEOUS | Status: DC
  Administered 2011-09-05 – 2011-09-07 (×6): 30 mg via SUBCUTANEOUS
  Filled 2011-09-05 (×12): qty 0.3

## 2011-09-05 MED ORDER — RISPERIDONE MICROSPHERES 25 MG IM SUSR
25.0000 mg | INTRAMUSCULAR | Status: DC
  Filled 2011-09-05 (×2): qty 2

## 2011-09-05 NOTE — Progress Notes (Signed)
Orthopedic Tech Progress Note Patient Details:  Allison Moore [birth year] [id number]  Other Ortho Devices Type of Ortho Device: Other (comment) (ankle air cast) Ortho Device Location: left fppt Ortho Device Interventions: Application   Allison Moore 09/05/2011, 5:05 PM

## 2011-09-05 NOTE — Consult Note (Signed)
Consult dictated   30 y/o female with MS and psych issues HBC R bicondylar tibial plateau fx L LC1 pelvic ring fracture L ankle lateral mall fracture   Plan for OR tues or thurs of next week Aggressive ICE and ELEVATION ACE APPLIED TO R KNEE NWB R LEX, cont with knee immobilizer WBAT L LEx with air cast and Ace to ankle Bed to chair for now  Mearl Latin, PA-C  Consult number: [phone]
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
## 2011-09-05 NOTE — Progress Notes (Signed)
Allison Moore 30 y.o. 09/03/2011   Lab. Results:  Basename 09/04/11 0735 09/03/11 1950  WBC 6.3 12.3*  HGB 11.5* 13.7  HCT 35.1* 39.9  PLT 233 296   BMET  Basename 09/04/11 0735 09/03/11 1950  NA 136 141  K 3.9 3.3*  CL 104 104  CO2 25 26  GLUCOSE 122* 114*  BUN 15 17  CREATININE 0.66 0.79  CALCIUM 8.6 9.4    No results found for this basename: inr   VITALS Filed Vitals:   09/05/11 0709  BP: 130/62  Pulse: 80  Temp: 98.2 F (36.8 C)  Resp: 20     Subjective Patient complains of right knee pain  Objective Neurovascularly grossly intact.  Compartments soft and nontender.  Sensation to light touch in the foot is intact   2+ dorsalis pedis posterior tibialis pulses.   Assessment/ Plan  plan for Dr. Carola Frost to evaluate the patient for definitive fracture fixation.  Continue current treatment plan.  No new recommendations at this time.   Recie Cirrincione D 11/11/201210:53 AM

## 2011-09-06 ENCOUNTER — Encounter (HOSPITAL_COMMUNITY): Payer: Self-pay | Admitting: Physician Assistant

## 2011-09-06 DIAGNOSIS — S8263XA Displaced fracture of lateral malleolus of unspecified fibula, initial encounter for closed fracture: Secondary | ICD-10-CM

## 2011-09-06 DIAGNOSIS — S32810A Multiple fractures of pelvis with stable disruption of pelvic ring, initial encounter for closed fracture: Secondary | ICD-10-CM

## 2011-09-06 DIAGNOSIS — D62 Acute posthemorrhagic anemia: Secondary | ICD-10-CM

## 2011-09-06 HISTORY — DX: Acute posthemorrhagic anemia: D62

## 2011-09-06 HISTORY — DX: Displaced fracture of lateral malleolus of unspecified fibula, initial encounter for closed fracture: S82.63XA

## 2011-09-06 HISTORY — DX: Multiple fractures of pelvis with stable disruption of pelvic ring, initial encounter for closed fracture: S32.810A

## 2011-09-06 LAB — CBC
Hemoglobin: 10.6 g/dL — ABNORMAL LOW (ref 12.0–15.0)
MCHC: 33.9 g/dL (ref 30.0–36.0)
RBC: 3.61 MIL/uL — ABNORMAL LOW (ref 3.87–5.11)
WBC: 7 10*3/uL (ref 4.0–10.5)

## 2011-09-06 MED ORDER — MAGNESIUM HYDROXIDE 400 MG/5ML PO SUSP
30.0000 mL | Freq: Every day | ORAL | Status: DC | PRN
  Filled 2011-09-06: qty 30

## 2011-09-06 MED ORDER — MAGNESIUM HYDROXIDE NICU ORAL SYRINGE 400 MG/5 ML
30.0000 mL | Freq: Every day | ORAL | Status: DC | PRN
  Filled 2011-09-06: qty 30

## 2011-09-06 MED ORDER — POLYETHYLENE GLYCOL 3350 17 G PO PACK
17.0000 g | PACK | Freq: Every day | ORAL | Status: DC
  Administered 2011-09-06 – 2011-09-12 (×6): 17 g via ORAL
  Filled 2011-09-06 (×8): qty 1

## 2011-09-06 NOTE — Progress Notes (Signed)
Pt sitting in a chair with moderate pain to right knee and sacral area. Pt states she is scheduled for surgery by Dr. Carola Frost on Thursday of this week. We will defer all orthopedic matters to Dr. Carola Frost. Currently pt is stable with injuries to right knee, tibial plateau fx, left ankle avulsion and sacral fractures

## 2011-09-06 NOTE — Progress Notes (Signed)
CSW completed initial assessment - located in shadow chart.  CSW completed SBIRT assessment with patient at bedside.  Patient acknowledges that she currently has a substance abuse concern regarding alcohol and marijuana.  Patient drinks 1-2 drinks and smokes marijuana.  Patient agreeable to substance abuse community resources at this time.  Patient fully understands the negative consequences of continued use with all substances.  CSW utilized an abbreviated motivational interviewing technique to address the importance of patient realization for these negative consequences.  CSW will remain available for continued support and dc needs.  [address] [place], [place] [phone]
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
## 2011-09-06 NOTE — Progress Notes (Signed)
Occupational Therapy Evaluation Patient Details Name: Allison Moore MRN: [id number] DOB: Apr 09, 1981 Today's Date: 09/06/2011  Subjective: "I was going to eat my lunch and take a nap" Pt declining OT eval this afternoon but agreeable to eval in the AM of 09/07/11. OT to re attempt eval at later date and time.  Pt setup in bed with lunch tray to eat sandwich. RN [facility]) present and providing pain medication.  Pt states pain is currently 5 out 10.  OT to follow acutely.   09/06/2011 Lucile Shutters   OTR/L Pager: [phone] Office: [phone] .
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
## 2011-09-07 MED ORDER — RISPERIDONE MICROSPHERES 25 MG IM SUSR
25.0000 mg | INTRAMUSCULAR | Status: DC
  Administered 2011-09-07: 25 mg via INTRAMUSCULAR

## 2011-09-07 MED ORDER — OXYCODONE HCL 5 MG PO TABS
5.0000 mg | ORAL_TABLET | ORAL | Status: DC | PRN
  Administered 2011-09-07: 20 mg via ORAL
  Administered 2011-09-07: 15 mg via ORAL
  Administered 2011-09-08 – 2011-09-10 (×7): 20 mg via ORAL
  Filled 2011-09-07 (×6): qty 4
  Filled 2011-09-07: qty 3
  Filled 2011-09-07 (×2): qty 4

## 2011-09-07 MED ORDER — DOCUSATE SODIUM 100 MG PO CAPS
200.0000 mg | ORAL_CAPSULE | Freq: Two times a day (BID) | ORAL | Status: DC
  Administered 2011-09-07 – 2011-09-12 (×10): 200 mg via ORAL
  Filled 2011-09-07 (×3): qty 2
  Filled 2011-09-07: qty 1
  Filled 2011-09-07 (×4): qty 2
  Filled 2011-09-07: qty 1
  Filled 2011-09-07 (×4): qty 2
  Filled 2011-09-07: qty 1
  Filled 2011-09-07 (×2): qty 2

## 2011-09-07 MED ORDER — HYDROMORPHONE HCL PF 1 MG/ML IJ SOLN: 0.5000 mg | INTRAMUSCULAR | Status: DC | PRN

## 2011-09-07 NOTE — Progress Notes (Signed)
LOS: 4 days   Subjective: Wants to stop taking hydrocodone, says it makes her crazy. Has done well on Percocet in the past.  Objective: Vital signs in last 24 hours: Temp:  [99 F (37.2 C)-99.7 F (37.6 C)] 99.6 F (37.6 C) (11/13 0603) Pulse Rate:  [87-104] 104  (11/13 0603) Resp:  [18-20] 18  (11/13 0603) BP: (104-116)/(39-57) 104/47 mmHg (11/13 0603) SpO2:  [98 %-100 %] 98 % (11/13 0603) Last BM Date: 09/03/11  Lab Results:  CBC  Basename 09/06/11 0904  WBC 7.0  HGB 10.6*  HCT 31.3*  PLT 201   General appearance: alert and no distress Resp: clear to auscultation bilaterally Cardio: regular rate and rhythm GI: normal findings: soft, non-tender Extremities: NVI  Assessment/Plan: HBC Right tibia plateau fx -- For OR Thurday by Dr. Carola Frost Left lateral malleolus fx -- Aircast Left pelvic fx ABL anemia -- Mild. Follow. Multiple abrasions -- Local care Mult. Med probs -- Home meds FEN -- Change norco to oxy. D/c foley VTE -- Lovenox Dispo -- OR Thurs. Will likely need ALF/SNF as lives on 2nd floor and has no one she can stay with.   Freeman Caldron, PA-C Pager: [phone] General Trauma PA Pager: [phone]   09/07/2011
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
## 2011-09-07 NOTE — Progress Notes (Signed)
Physical Therapy Treatment Patient Details Name: Allison Moore MRN: [id number] DOB: October 28, 1980 Today's Date: 09/07/2011  PT Assessment/Plan  PT - Assessment/Plan Comments on Treatment Session: Pt still limited by increased pain and cognition. Pt has difficulty with mobility secondary to pain and requires assist of 2-3. Pt did have a slight improvement in bed mobility and was able to assit more into sitting. Surgery planned for Thursday.  PT Plan: Discharge plan needs to be updated (CIR rehab consult) PT Frequency: Min 5X/week Follow Up Recommendations: Inpatient Rehab;24 hour supervision/assistance Equipment Recommended: Defer to next venue PT Goals  Acute Rehab PT Goals PT Goal Formulation: With patient Time For Goal Achievement: 2 weeks PT Goal: Supine/Side to Sit - Progress: Progressing toward goal PT Transfer Goal: Sit to Stand/Stand to Sit - Progress: Progressing toward goal PT Transfer Goal: Bed to Chair/Chair to Bed - Progress: Progressing toward goal PT Goal: Ambulate - Progress: Other (comment) (unassessed today) PT Goal: Up/Down Stairs - Progress: Other (comment) (unassessed today)  PT Treatment Precautions/Restrictions  Precautions Precautions: Knee Required Braces or Orthoses: Yes Knee Immobilizer: On at all times Restrictions Weight Bearing Restrictions: Yes RLE Weight Bearing: Non weight bearing LLE Weight Bearing: Weight bearing as tolerated Mobility (including Balance) Bed Mobility Bed Mobility: Yes Supine to Sit: HOB elevated (Comment degrees);2: Max assist (HOB 30 percent) Sitting - Scoot to Edge of Bed: 1: +2 Total assist;Patient percentage (comment) (20) Sitting - Scoot to Edge of Bed Details (indicate cue type and reason): VC for weight shifting and sequencing as well as head placement Transfers Transfers: Yes Sit to Stand: 1: +2 Total assist;Patient percentage (comment);From bed;From elevated surface (20%, bed elevated 4 inches) Sit to Stand Details  (indicate cue type and reason): VC for hand placement Stand to Sit: 1: +2 Total assist;Patient percentage (comment) (20) Stand to Sit Details: VC for hand placement. Uncontrolled descent. Stand Pivot Transfers: Other (comment) (+3 total assist, pt 10%) Stand Pivot Transfer Details (indicate cue type and reason): Assist of one to hold leg to maintain NWB. Assist of other two to move into chair. VC for sequencing and hand placement as well as movement of the LLE. Pt difficulty with bearing weight through arms to assist with transfer Ambulation/Gait Ambulation/Gait: No    Exercise    End of Session PT - End of Session Equipment Utilized During Treatment: Gait belt;Right knee immobilizer Activity Tolerance: Patient limited by pain;Treatment limited secondary to medical complications (Comment) (NWB status) Patient left: in chair;with call bell in reach Nurse Communication: Mobility status for transfers General Behavior During Session: [facility] for tasks performed Cognition: Impaired, at baseline  Milana Kidney 09/07/2011, 10:48 AM

## 2011-09-07 NOTE — Progress Notes (Signed)
Patient examined and I agree with the assessment and plan Patient working with therapies.  Patient interested in CIR eval after surgery. Allison Moore E

## 2011-09-07 NOTE — Plan of Care (Signed)
Problem: Phase III Progression Outcomes Goal: Ambulate BID Outcome: Not Progressing Pt requires total +3 for OOB<>chair. Recommend Museum/gallery exhibitions officer.

## 2011-09-08 MED ORDER — VANCOMYCIN HCL IN DEXTROSE 1-5 GM/200ML-% IV SOLN
1000.0000 mg | Freq: Once | INTRAVENOUS | Status: DC
  Filled 2011-09-08: qty 200

## 2011-09-08 MED ORDER — ENOXAPARIN SODIUM 30 MG/0.3ML ~~LOC~~ SOLN
30.0000 mg | Freq: Two times a day (BID) | SUBCUTANEOUS | Status: DC
  Administered 2011-09-08: 30 mg via SUBCUTANEOUS
  Filled 2011-09-08 (×2): qty 0.3

## 2011-09-08 NOTE — Progress Notes (Signed)
This patient has been seen and I agree with the findings and treatment plan.  Yacine Droz O. Emelly Wurtz, III, MD, FACS [phone] (pager) [phone] (direct pager) Trauma Surgeon  

## 2011-09-08 NOTE — Progress Notes (Signed)
Physical Therapy Treatment Patient Details Name: Allison Moore MRN: [id number] DOB: 1981-06-28 Today's Date: 09/08/2011  PT Assessment/Plan  PT - Assessment/Plan Comments on Treatment Session: Pt made progress today, requiring only minimal assistance for bed mobility. She was also able to assist with transfer today bearing weight through LLE and UEs with the walker. Surgery schedule for tomorrow. Will reassess Friday PT Plan: Discharge plan remains appropriate PT Frequency: Min 5X/week Follow Up Recommendations: Inpatient Rehab;24 hour supervision/assistance Equipment Recommended: Defer to next venue PT Goals  Acute Rehab PT Goals PT Goal Formulation: With patient Time For Goal Achievement: 2 weeks PT Goal: Supine/Side to Sit - Progress: Progressing toward goal PT Transfer Goal: Sit to Stand/Stand to Sit - Progress: Progressing toward goal PT Transfer Goal: Bed to Chair/Chair to Bed - Progress: Progressing toward goal PT Goal: Ambulate - Progress: Progressing toward goal PT Goal: Up/Down Stairs - Progress: Other (comment) (unassessed today)  PT Treatment Precautions/Restrictions  Precautions Precautions: Knee Precaution Comments: LLE Ankle Air cast Required Braces or Orthoses: Yes Knee Immobilizer: On at all times Restrictions Weight Bearing Restrictions: Yes RLE Weight Bearing: Non weight bearing LLE Weight Bearing: Weight bearing as tolerated Mobility (including Balance) Bed Mobility Bed Mobility: Yes Supine to Sit: HOB elevated (Comment degrees);4: Min assist;With rails (30) Supine to Sit Details (indicate cue type and reason): VC for sequencing and assist with RLE Sitting - Scoot to Edge of Bed: 3: Mod assist Sitting - Scoot to Edge of Bed Details (indicate cue type and reason): VC for hand placement to assist with scooting forward Transfers Transfers: Yes Sit to Stand: From elevated surface;1: +2 Total assist;Patient percentage (comment);From bed (bed elevated ~4  inches, Pt percentage 60%) Sit to Stand Details (indicate cue type and reason): VC for hand placement and sequencing (Pt stood for 3 minutes(mod A) during nurse tech hygiene) Stand to Sit: 3: Mod assist;To chair/3-in-1 Stand to Sit Details: More controlled descent since previous sessions. Stand Pivot Transfers: 1: +2 Total assist (Pt 30%) Stand Pivot Transfer Details (indicate cue type and reason): Pt able to assist with transfer today with minimal hopping on her LLE. VC for sequencing and tactile cues throughout to assist with pivotting and movement of LLE. Ambulation/Gait Ambulation/Gait: No    Exercise    End of Session PT - End of Session Equipment Utilized During Treatment: Gait belt;Right knee immobilizer Activity Tolerance: Patient limited by pain;Treatment limited secondary to medical complications (Comment) (NWB RLE) Patient left: in chair;with call bell in reach Nurse Communication: Mobility status for transfers;Need for lift equipment (lift equipment placed in chair) General Behavior During Session: [facility] for tasks performed Cognition: [facility] for tasks performed  Milana Kidney 09/08/2011, 3:39 PM  09/08/2011 Milana Kidney DPT PAGER: [phone] OFFICE: [phone]
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
## 2011-09-08 NOTE — Progress Notes (Signed)
CSW spoke with patient at bedside to discuss the possibility of SNF placement at dc.  Patient states that she is not comfortable sending out her information until after surgery.  CSW explained the importance of having a plan in place once patient is medically ready for dc.  Patient states that she has spoken with a good friend who lives with no stairs and may be able to provide 24 hour support.    CSW has begun FL2 and PASARR submission process in the event that patient will need placement at dc.  CSW will follow up with patient following surgery tomorrow to determine patient plans at dc.  CSW did not yet initiate a SNF search due to patient remaining adamant about organizing dc plans with family/friends.  CSW remains available for continued support.  [address] [place], [place] [phone]
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
## 2011-09-09 ENCOUNTER — Other Ambulatory Visit: Payer: Self-pay

## 2011-09-09 LAB — CBC
HCT: 31.4 % — ABNORMAL LOW (ref 36.0–46.0)
Hemoglobin: 10.5 g/dL — ABNORMAL LOW (ref 12.0–15.0)
MCV: 87.2 fL (ref 78.0–100.0)
Platelets: 273 10*3/uL (ref 150–400)
RBC: 3.6 MIL/uL — ABNORMAL LOW (ref 3.87–5.11)
WBC: 7.3 10*3/uL (ref 4.0–10.5)

## 2011-09-09 LAB — TYPE AND SCREEN
ABO/RH(D): O POS
Antibody Screen: NEGATIVE

## 2011-09-09 LAB — BASIC METABOLIC PANEL
CO2: 28 mEq/L (ref 19–32)
Chloride: 98 mEq/L (ref 96–112)
Creatinine, Ser: 0.66 mg/dL (ref 0.50–1.10)

## 2011-09-09 MED ORDER — MAGNESIUM CITRATE PO SOLN
296.0000 mL | Freq: Two times a day (BID) | ORAL | Status: DC | PRN
  Administered 2011-09-09 – 2011-09-12 (×2): 296 mL via ORAL
  Filled 2011-09-09 (×2): qty 296

## 2011-09-09 MED ORDER — VANCOMYCIN HCL IN DEXTROSE 1-5 GM/200ML-% IV SOLN
1000.0000 mg | Freq: Once | INTRAVENOUS | Status: AC
  Administered 2011-09-10: 1000 mg via INTRAVENOUS
  Filled 2011-09-09: qty 200

## 2011-09-09 MED ORDER — BISACODYL 5 MG PO TBEC
10.0000 mg | DELAYED_RELEASE_TABLET | Freq: Every day | ORAL | Status: DC
  Administered 2011-09-09 – 2011-09-12 (×3): 10 mg via ORAL
  Filled 2011-09-09 (×4): qty 2

## 2011-09-09 MED ORDER — VANCOMYCIN HCL IN DEXTROSE 1-5 GM/200ML-% IV SOLN: 1000.0000 mg | Freq: Once | INTRAVENOUS | Status: DC

## 2011-09-09 NOTE — Progress Notes (Signed)
Pt seen and examined.  To OR today.

## 2011-09-09 NOTE — Progress Notes (Signed)
Clinical Social Worker spoke with patient at bedside to explain the process of initiating a skilled nursing facility search.  Patient feels like she has a better grasp of the skilled nursing facility concept and is agreeable to begin SNF search.  CSW has completed FL2 and initiated the SNF search in [place] per patient request.    Patient to have ORIF on 11/16.  CSW will follow up with patient to provide bed offers and facilitate patient discharge needs.  CSW available for support as needed.  [address] [place], [place] [phone]
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
## 2011-09-10 ENCOUNTER — Inpatient Hospital Stay (HOSPITAL_COMMUNITY): Payer: Medicare Other

## 2011-09-10 ENCOUNTER — Encounter (HOSPITAL_COMMUNITY): Payer: Self-pay | Admitting: Anesthesiology

## 2011-09-10 ENCOUNTER — Encounter (HOSPITAL_COMMUNITY): Admission: EM | Disposition: A | Discharge status: Skilled Nursing Facility | Payer: Self-pay | Source: Ambulatory Visit

## 2011-09-10 ENCOUNTER — Inpatient Hospital Stay (HOSPITAL_COMMUNITY): Payer: Medicare Other | Admitting: Anesthesiology

## 2011-09-10 HISTORY — PX: ORIF TIBIA PLATEAU: SHX2132

## 2011-09-10 LAB — CBC
MCH: 28.8 pg (ref 26.0–34.0)
MCV: 87 fL (ref 78.0–100.0)
Platelets: 307 10*3/uL (ref 150–400)
RDW: 12.3 % (ref 11.5–15.5)

## 2011-09-10 SURGERY — OPEN REDUCTION INTERNAL FIXATION (ORIF) TIBIAL PLATEAU
Anesthesia: General | Site: Knee | Laterality: Right | Wound class: Clean

## 2011-09-10 MED ORDER — SORBITOL 70 % SOLN
TOPICAL_OIL | Freq: Once | ORAL | Status: DC
  Filled 2011-09-10: qty 240

## 2011-09-10 MED ORDER — DIPHENHYDRAMINE HCL 50 MG/ML IJ SOLN: 12.5000 mg | Freq: Four times a day (QID) | INTRAMUSCULAR | Status: DC | PRN

## 2011-09-10 MED ORDER — ONDANSETRON HCL 4 MG/2ML IJ SOLN
INTRAMUSCULAR | Status: DC | PRN
  Administered 2011-09-10: 4 mg via INTRAVENOUS

## 2011-09-10 MED ORDER — FENTANYL CITRATE 0.05 MG/ML IJ SOLN: 50.0000 ug | INTRAMUSCULAR | Status: DC | PRN

## 2011-09-10 MED ORDER — MIDAZOLAM HCL 2 MG/2ML IJ SOLN: 1.0000 mg | INTRAMUSCULAR | Status: DC | PRN

## 2011-09-10 MED ORDER — PROPOFOL 10 MG/ML IV EMUL
INTRAVENOUS | Status: DC | PRN
  Administered 2011-09-10: 200 mg via INTRAVENOUS

## 2011-09-10 MED ORDER — KETOROLAC TROMETHAMINE 30 MG/ML IJ SOLN
INTRAMUSCULAR | Status: AC
  Filled 2011-09-10: qty 1

## 2011-09-10 MED ORDER — VANCOMYCIN HCL IN DEXTROSE 1-5 GM/200ML-% IV SOLN
1000.0000 mg | Freq: Two times a day (BID) | INTRAVENOUS | Status: AC
  Administered 2011-09-10 – 2011-09-11 (×2): 1000 mg via INTRAVENOUS
  Filled 2011-09-10 (×2): qty 200

## 2011-09-10 MED ORDER — HYDROMORPHONE 0.3 MG/ML IV SOLN
INTRAVENOUS | Status: DC
  Administered 2011-09-10 – 2011-09-11 (×2): 7.5 mg via INTRAVENOUS
  Filled 2011-09-10: qty 25

## 2011-09-10 MED ORDER — POTASSIUM CHLORIDE IN NACL 20-0.9 MEQ/L-% IV SOLN
INTRAVENOUS | Status: DC
  Administered 2011-09-10 – 2011-09-11 (×2): via INTRAVENOUS
  Filled 2011-09-10 (×7): qty 1000

## 2011-09-10 MED ORDER — HYDROMORPHONE HCL PF 1 MG/ML IJ SOLN
0.2500 mg | INTRAMUSCULAR | Status: DC | PRN
  Administered 2011-09-10 (×3): 0.5 mg via INTRAVENOUS

## 2011-09-10 MED ORDER — MIDAZOLAM HCL 5 MG/5ML IJ SOLN
INTRAMUSCULAR | Status: DC | PRN
  Administered 2011-09-10: 2 mg via INTRAVENOUS

## 2011-09-10 MED ORDER — GLYCOPYRROLATE 0.2 MG/ML IJ SOLN
INTRAMUSCULAR | Status: DC | PRN
  Administered 2011-09-10: .6 mg via INTRAVENOUS

## 2011-09-10 MED ORDER — METOCLOPRAMIDE HCL 5 MG/ML IJ SOLN
5.0000 mg | Freq: Three times a day (TID) | INTRAMUSCULAR | Status: DC | PRN
Start: 2011-09-10 — End: 2011-09-13
  Filled 2011-09-10: qty 2

## 2011-09-10 MED ORDER — METHOCARBAMOL 100 MG/ML IJ SOLN
500.0000 mg | Freq: Four times a day (QID) | INTRAVENOUS | Status: DC | PRN
  Administered 2011-09-10: 1000 mg via INTRAVENOUS
  Filled 2011-09-10: qty 10

## 2011-09-10 MED ORDER — KETOROLAC TROMETHAMINE 30 MG/ML IJ SOLN
15.0000 mg | Freq: Once | INTRAMUSCULAR | Status: AC | PRN
  Administered 2011-09-10: 30 mg via INTRAVENOUS

## 2011-09-10 MED ORDER — DIPHENHYDRAMINE HCL 12.5 MG/5ML PO ELIX
12.5000 mg | ORAL_SOLUTION | Freq: Four times a day (QID) | ORAL | Status: DC | PRN
  Filled 2011-09-10: qty 5

## 2011-09-10 MED ORDER — METHOCARBAMOL 500 MG PO TABS
500.0000 mg | ORAL_TABLET | Freq: Four times a day (QID) | ORAL | Status: DC | PRN
  Administered 2011-09-11: 500 mg via ORAL
  Filled 2011-09-10: qty 1

## 2011-09-10 MED ORDER — MEPERIDINE HCL 25 MG/ML IJ SOLN: 6.2500 mg | INTRAMUSCULAR | Status: DC | PRN

## 2011-09-10 MED ORDER — PROMETHAZINE HCL 25 MG/ML IJ SOLN: 6.2500 mg | INTRAMUSCULAR | Status: DC | PRN

## 2011-09-10 MED ORDER — ENOXAPARIN SODIUM 30 MG/0.3ML ~~LOC~~ SOLN
30.0000 mg | Freq: Two times a day (BID) | SUBCUTANEOUS | Status: DC
  Administered 2011-09-10 – 2011-09-13 (×6): 30 mg via SUBCUTANEOUS
  Filled 2011-09-10 (×7): qty 0.3

## 2011-09-10 MED ORDER — HYDROMORPHONE HCL PF 1 MG/ML IJ SOLN
INTRAMUSCULAR | Status: AC
  Administered 2011-09-10: 0.5 mg via INTRAVENOUS
  Filled 2011-09-10: qty 1

## 2011-09-10 MED ORDER — FENTANYL CITRATE 0.05 MG/ML IJ SOLN
INTRAMUSCULAR | Status: DC | PRN
  Administered 2011-09-10 (×3): 50 ug via INTRAVENOUS
  Administered 2011-09-10: 100 ug via INTRAVENOUS
  Administered 2011-09-10 (×6): 50 ug via INTRAVENOUS

## 2011-09-10 MED ORDER — LACTATED RINGERS IV SOLN
INTRAVENOUS | Status: DC | PRN
  Administered 2011-09-10 (×3): via INTRAVENOUS

## 2011-09-10 MED ORDER — SENNOSIDES-DOCUSATE SODIUM 8.6-50 MG PO TABS: 1.0000 | ORAL_TABLET | Freq: Every evening | ORAL | Status: DC | PRN

## 2011-09-10 MED ORDER — ROCURONIUM BROMIDE 100 MG/10ML IV SOLN
INTRAVENOUS | Status: DC | PRN
  Administered 2011-09-10: 10 mg via INTRAVENOUS
  Administered 2011-09-10: 20 mg via INTRAVENOUS
  Administered 2011-09-10: 50 mg via INTRAVENOUS
  Administered 2011-09-10: 20 mg via INTRAVENOUS
  Administered 2011-09-10: 10 mg via INTRAVENOUS
  Administered 2011-09-10: 5 mg via INTRAVENOUS

## 2011-09-10 MED ORDER — METOCLOPRAMIDE HCL 10 MG PO TABS: 5.0000 mg | ORAL_TABLET | Freq: Three times a day (TID) | ORAL | Status: DC | PRN

## 2011-09-10 MED ORDER — LACTATED RINGERS IV SOLN
INTRAVENOUS | Status: DC
  Administered 2011-09-10 (×2): via INTRAVENOUS

## 2011-09-10 MED ORDER — SODIUM CHLORIDE 0.9 % IR SOLN
Status: DC | PRN
  Administered 2011-09-10: 1000 mL

## 2011-09-10 MED ORDER — NEOSTIGMINE METHYLSULFATE 1 MG/ML IJ SOLN
INTRAMUSCULAR | Status: DC | PRN
  Administered 2011-09-10: 5 mg via INTRAVENOUS

## 2011-09-10 MED ORDER — SODIUM CHLORIDE 0.9 % IJ SOLN: 9.0000 mL | INTRAMUSCULAR | Status: DC | PRN

## 2011-09-10 MED ORDER — NALOXONE HCL 0.4 MG/ML IJ SOLN: 0.4000 mg | INTRAMUSCULAR | Status: DC | PRN

## 2011-09-10 MED ORDER — VANCOMYCIN HCL 1000 MG IV SOLR
1000.0000 mg | INTRAVENOUS | Status: DC | PRN
  Administered 2011-09-10: 1 g via INTRAVENOUS

## 2011-09-10 SURGICAL SUPPLY — 84 items
BANDAGE ELASTIC 4 VELCRO ST LF (GAUZE/BANDAGES/DRESSINGS) ×1 IMPLANT
BANDAGE ELASTIC 6 VELCRO ST LF (GAUZE/BANDAGES/DRESSINGS) ×1 IMPLANT
BANDAGE GAUZE ELAST BULKY 4 IN (GAUZE/BANDAGES/DRESSINGS) ×1 IMPLANT
BIT DRILL 2.5X110 QC LCP DISP (BIT) ×1 IMPLANT
BLADE SURG 10 STRL SS (BLADE) ×1 IMPLANT
BLADE SURG 15 STRL LF DISP TIS (BLADE) ×1 IMPLANT
BLADE SURG 15 STRL SS (BLADE)
BLADE SURG ROTATE 9660 (MISCELLANEOUS) IMPLANT
BONE CHIP PRESERV 20CC (Bone Implant) ×1 IMPLANT
BRUSH SCRUB DISP (MISCELLANEOUS) ×4 IMPLANT
CLEANER TIP ELECTROSURG 2X2 (MISCELLANEOUS) ×1 IMPLANT
CLOTH BEACON ORANGE TIMEOUT ST (SAFETY) ×2 IMPLANT
COMPONENT TIB PRX PSTMD 3.5 2H (Orthopedic Implant) IMPLANT
COVER MAYO STAND STRL (DRAPES) ×3 IMPLANT
DRAPE C-ARM 42X72 X-RAY (DRAPES) ×2 IMPLANT
DRAPE INCISE IOBAN 66X45 STRL (DRAPES) ×1 IMPLANT
DRAPE ORTHO SPLIT 77X108 STRL (DRAPES) ×2
DRAPE SURG ORHT 6 SPLT 77X108 (DRAPES) IMPLANT
DRAPE U-SHAPE 47X51 STRL (DRAPES) ×2 IMPLANT
DRSG ADAPTIC 3X8 NADH LF (GAUZE/BANDAGES/DRESSINGS) ×1 IMPLANT
DRSG PAD ABDOMINAL 8X10 ST (GAUZE/BANDAGES/DRESSINGS) ×6 IMPLANT
ELECT BLADE 4.0 EZ CLEAN MEGAD (MISCELLANEOUS) ×2
ELECT REM PT RETURN 9FT ADLT (ELECTROSURGICAL) ×2
ELECTRODE BLDE 4.0 EZ CLN MEGD (MISCELLANEOUS) IMPLANT
ELECTRODE REM PT RTRN 9FT ADLT (ELECTROSURGICAL) ×1 IMPLANT
EVACUATOR 1/8 PVC DRAIN (DRAIN) IMPLANT
EVACUATOR 3/16  PVC DRAIN (DRAIN)
EVACUATOR 3/16 PVC DRAIN (DRAIN) IMPLANT
GAUZE KERLIX 2  STERILE LF (GAUZE/BANDAGES/DRESSINGS) ×2 IMPLANT
GAUZE SPONGE 4X4 12PLY STRL LF (GAUZE/BANDAGES/DRESSINGS) ×1 IMPLANT
GLOVE BIO SURGEON STRL SZ7.5 (GLOVE) ×2 IMPLANT
GLOVE BIO SURGEON STRL SZ8 (GLOVE) ×2 IMPLANT
GLOVE BIOGEL PI IND STRL 7.5 (GLOVE) ×1 IMPLANT
GLOVE BIOGEL PI IND STRL 8 (GLOVE) ×1 IMPLANT
GLOVE BIOGEL PI INDICATOR 7.5 (GLOVE) ×1
GLOVE BIOGEL PI INDICATOR 8 (GLOVE) ×1
GOWN PREVENTION PLUS XLARGE (GOWN DISPOSABLE) ×2 IMPLANT
GOWN STRL NON-REIN LRG LVL3 (GOWN DISPOSABLE) ×4 IMPLANT
IMMOBILIZER KNEE 22 UNIV (SOFTGOODS) ×2 IMPLANT
KIT BASIN OR (CUSTOM PROCEDURE TRAY) ×2 IMPLANT
KIT ROOM TURNOVER OR (KITS) ×2 IMPLANT
MANIFOLD NEPTUNE II (INSTRUMENTS) ×2 IMPLANT
NEEDLE 22X1 1/2 (OR ONLY) (NEEDLE) IMPLANT
NS IRRIG 1000ML POUR BTL (IV SOLUTION) ×2 IMPLANT
PACK ORTHO EXTREMITY (CUSTOM PROCEDURE TRAY) ×1 IMPLANT
PACK TOTAL JOINT (CUSTOM PROCEDURE TRAY) ×1 IMPLANT
PAD ARMBOARD 7.5X6 YLW CONV (MISCELLANEOUS) ×2 IMPLANT
PAD CAST 4YDX4 CTTN HI CHSV (CAST SUPPLIES) ×1 IMPLANT
PADDING CAST COTTON 4X4 STRL (CAST SUPPLIES)
PADDING CAST COTTON 6X4 STRL (CAST SUPPLIES) ×1 IMPLANT
PROS SM T PL OB 3X3X53M (Orthopedic Implant) ×2 IMPLANT
PROSTHESIS SM T PL OB 3X3X53M (Orthopedic Implant) IMPLANT
SCREW CORTEX 3.5 40MM (Screw) ×1 IMPLANT
SCREW CORTEX 3.5 45MM (Screw) ×1 IMPLANT
SCREW CORTEX 3.5 60MM (Screw) ×4 IMPLANT
SCREW CORTEX 3.5 65MM (Screw) ×1 IMPLANT
SCREW CORTEX 3.5X75MM (Screw) ×1 IMPLANT
SCREW LOCK CORT ST 3.5X40 (Screw) IMPLANT
SCREW LOCK T15 FT 55X3.5X2.9X (Screw) IMPLANT
SCREW LOCK T15 FT 60X3.5X2.9X (Screw) IMPLANT
SCREW LOCKING 3.5X50 (Screw) ×1 IMPLANT
SCREW LOCKING 3.5X55 (Screw) ×2 IMPLANT
SCREW LOCKING 3.5X60 (Screw) ×2 IMPLANT
SPONGE GAUZE 4X4 12PLY (GAUZE/BANDAGES/DRESSINGS) ×1 IMPLANT
SPONGE LAP 18X18 X RAY DECT (DISPOSABLE) ×3 IMPLANT
STAPLER VISISTAT 35W (STAPLE) ×2 IMPLANT
STOCKINETTE IMPERVIOUS LG (DRAPES) ×2 IMPLANT
SUCTION FRAZIER TIP 10 FR DISP (SUCTIONS) ×2 IMPLANT
SUT ETHILON 3 0 PS 1 (SUTURE) ×4 IMPLANT
SUT PROLENE 0 CT 2 (SUTURE) IMPLANT
SUT VIC AB 0 CT1 27 (SUTURE)
SUT VIC AB 0 CT1 27XBRD ANBCTR (SUTURE) ×1 IMPLANT
SUT VIC AB 1 CT1 27 (SUTURE)
SUT VIC AB 1 CT1 27XBRD ANBCTR (SUTURE) ×1 IMPLANT
SUT VIC AB 2-0 CT1 27 (SUTURE) ×4
SUT VIC AB 2-0 CT1 TAPERPNT 27 (SUTURE) ×2 IMPLANT
SYR 20ML ECCENTRIC (SYRINGE) IMPLANT
TIBIA PROX POSTMED LCP 3.5 2H (Orthopedic Implant) ×2 IMPLANT
TOWEL OR 17X24 6PK STRL BLUE (TOWEL DISPOSABLE) ×2 IMPLANT
TOWEL OR 17X26 10 PK STRL BLUE (TOWEL DISPOSABLE) ×4 IMPLANT
TRAY FOLEY CATH 14FR (SET/KITS/TRAYS/PACK) ×1 IMPLANT
TUBE CONNECTING 12X1/4 (SUCTIONS) ×3 IMPLANT
WATER STERILE IRR 1000ML POUR (IV SOLUTION) ×4 IMPLANT
YANKAUER SUCT BULB TIP NO VENT (SUCTIONS) ×3 IMPLANT

## 2011-09-10 NOTE — Anesthesia Procedure Notes (Addendum)
Performed by: Julianne Rice K   Procedure Name: Intubation Date/Time: 09/10/2011 2:23 PM Performed by: Julianne Rice K Pre-anesthesia Checklist: Patient identified, Timeout performed, Emergency Drugs available, Suction available and Patient being monitored Patient Re-evaluated:Patient Re-evaluated prior to inductionOxygen Delivery Method: Circle System Utilized Preoxygenation: Pre-oxygenation with 100% oxygen Intubation Type: IV induction Ventilation: Mask ventilation without difficulty Laryngoscope Size: Miller and 2 Grade View: Grade II Tube type: Oral Tube size: 7.5 mm Number of attempts: 1 Airway Equipment and Method: stylet and patient positioned with wedge pillow Placement Confirmation: ETT inserted through vocal cords under direct vision and breath sounds checked- equal and bilateral Secured at: 22 cm Tube secured with: Tape Dental Injury: Teeth and Oropharynx as per pre-operative assessment

## 2011-09-10 NOTE — Transfer of Care (Signed)
Immediate Anesthesia Transfer of Care Note  Patient: Allison Moore  Procedure(s) Performed:  OPEN REDUCTION INTERNAL FIXATION (ORIF) TIBIAL PLATEAU - Right posterior knee  Patient Location: PACU  Anesthesia Type: General  Level of Consciousness: awake, alert  and patient cooperative  Airway & Oxygen Therapy: Patient Spontanous Breathing and Patient connected to face mask oxygen  Post-op Assessment: Report given to PACU RN  Post vital signs: Reviewed and stable  Complications: No apparent anesthesia complications

## 2011-09-10 NOTE — Progress Notes (Signed)
Patient examined and I agree with the assessment and plan Working on constipation.  Patient going to OR today as above. Allison Moore

## 2011-09-10 NOTE — Plan of Care (Signed)
Problem: Phase I Progression Outcomes Goal: Initial discharge plan identified Outcome: Completed/Met Date Met:  09/10/11 Plan for short term SNF

## 2011-09-10 NOTE — Progress Notes (Signed)
Pt currently in OR for surgery, therefore will not have PT today. Will attempt tomorrow as schedule permits  09/10/2011 Milana Kidney DPT PAGER: [phone] OFFICE: [phone]

## 2011-09-10 NOTE — Brief Op Note (Signed)
09/03/2011 - 09/10/2011  7:56 PM  PATIENT:  Allison Moore  30 y.o. female  PRE-OPERATIVE DIAGNOSIS:  right tibial bicondylar plateu fracture   POST-OPERATIVE DIAGNOSIS:  right tibial bicondylar  plateau fracture  PROCEDURE:  Procedure(s): OPEN REDUCTION INTERNAL FIXATION (ORIF)  bicondylar TIBIAL PLATEAU Stress Flouro  SURGEON:  Surgeon(s): [employer]  PHYSICIAN ASSISTANT: Montez Morita, [facility]   ANESTHESIA:   general  EBL:     BLOOD ADMINISTERED:none  DRAINS: none   LOCAL MEDICATIONS USED:  NONE  SPECIMEN:  No Specimen  DISPOSITION OF SPECIMEN:  N/A  COUNTS:  YES  TOURNIQUET:  * Missing tourniquet times found for documented tourniquets in log:  9479 *  DICTATION: .Dragon Dictation and Other Dictation: Dictation Number [id number]  PLAN OF CARE: inpatient  PATIENT DISPOSITION:  PACU - hemodynamically stable.   Delay start of Pharmacological VTE agent (>24hrs) due to surgical blood loss or risk of bleeding: NO

## 2011-09-10 NOTE — Anesthesia Preprocedure Evaluation (Signed)
Anesthesia Evaluation  Patient identified by MRN, date of birth, ID band Patient awake    Reviewed: Allergy & Precautions, H&P , NPO status , Patient's Chart, lab work & pertinent test results  History of Anesthesia Complications (+) PONV and Family history of anesthesia reaction  Airway Mallampati: I  Neck ROM: Full    Dental  (+) Teeth Intact   Pulmonary  clear to auscultation        Cardiovascular neg cardio ROS Regular Normal    Neuro/Psych  Headaches, PSYCHIATRIC DISORDERS    GI/Hepatic negative GI ROS, Neg liver ROS,   Endo/Other    Renal/GU negative Renal ROS     Musculoskeletal   Abdominal   Peds  Hematology   Anesthesia Other Findings   Reproductive/Obstetrics                           Anesthesia Physical Anesthesia Plan  ASA: II  Anesthesia Plan: General   Post-op Pain Management:    Induction: Intravenous  Airway Management Planned: Oral ETT  Additional Equipment:   Intra-op Plan:   Post-operative Plan: Extubation in OR  Informed Consent: I have reviewed the patients History and Physical, chart, labs and discussed the procedure including the risks, benefits and alternatives for the proposed anesthesia with the patient or authorized representative who has indicated his/her understanding and acceptance.   Dental advisory given  Plan Discussed with: CRNA and Surgeon  Anesthesia Plan Comments:         Anesthesia Quick Evaluation
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
## 2011-09-10 NOTE — Anesthesia Postprocedure Evaluation (Signed)
  Anesthesia Post-op Note  Patient: Allison Moore  Procedure(s) Performed:  OPEN REDUCTION INTERNAL FIXATION (ORIF) TIBIAL PLATEAU - Right posterior knee  Patient Location: PACU  Anesthesia Type: General  Level of Consciousness: awake and sedated  Airway and Oxygen Therapy: Patient Spontanous Breathing and Patient connected to face mask oxygen  Post-op Pain: mild  Post-op Assessment: Post-op Vital signs reviewed, Patient's Cardiovascular Status Stable, Respiratory Function Stable, Patent Airway and No signs of Nausea or vomiting  Post-op Vital Signs: stable  Complications: No apparent anesthesia complications

## 2011-09-11 LAB — BASIC METABOLIC PANEL
CO2: 28 mEq/L (ref 19–32)
Calcium: 8.3 mg/dL — ABNORMAL LOW (ref 8.4–10.5)
Creatinine, Ser: 0.65 mg/dL (ref 0.50–1.10)
Glucose, Bld: 117 mg/dL — ABNORMAL HIGH (ref 70–99)

## 2011-09-11 LAB — CBC
Hemoglobin: 8.9 g/dL — ABNORMAL LOW (ref 12.0–15.0)
MCH: 28.9 pg (ref 26.0–34.0)
MCV: 88.3 fL (ref 78.0–100.0)
Platelets: 314 10*3/uL (ref 150–400)
RBC: 3.08 MIL/uL — ABNORMAL LOW (ref 3.87–5.11)

## 2011-09-11 MED ORDER — LACTATED RINGERS IV SOLN: INTRAVENOUS | Status: DC
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
## 2011-09-11 NOTE — Progress Notes (Signed)
Physical Therapy Treatment Patient Details Name: Allison Moore MRN: [id number] DOB: Feb 25, 1981 Today's Date: 09/11/2011  PT Assessment/Plan  PT - Assessment/Plan Comments on Treatment Session: Limited treatment today secondary to cardiopulmonary status. RN and ortho MD aware. PT Plan: Discharge plan remains appropriate PT Frequency: Min 5X/week PT Goals  Acute Rehab PT Goals PT Goal: Supine/Side to Sit - Progress:  (unable to address mobility goals secondary to HR) PT Transfer Goal: Sit to Stand/Stand to Sit - Progress: Other (comment) PT Transfer Goal: Bed to Chair/Chair to Bed - Progress: Other (comment) PT Goal: Ambulate - Progress: Other (comment) PT Goal: Up/Down Stairs - Progress: Other (comment)  PT Treatment Precautions/Restrictions  Precautions Precautions: Knee Precaution Comments: LLE Ankle Air cast Required Braces or Orthoses: Yes Knee Immobilizer:  (KI remained on today, Bledsoe not present) Restrictions Weight Bearing Restrictions: Yes RLE Weight Bearing: Non weight bearing LLE Weight Bearing: Weight bearing as tolerated Mobility (including Balance) Bed Mobility Bed Mobility: No Transfers Transfers: No    Exercise  General Exercises - Lower Extremity Heel Slides: AROM;Left;10 reps;Supine Hip ABduction/ADduction: AROM;Both;10 reps;Supine Straight Leg Raises: AROM;AAROM;Both;10 reps;20 reps;Supine (AAROM RLE x 20, AROM LLE x 10) End of Session PT - End of Session Activity Tolerance: Treatment limited secondary to medical complications (Comment) (Pt tachycardic supine with and without exercise. ) Patient left: in bed;with call bell in reach;with family/visitor present Nurse Communication: Other (comment) (RN aware of HR and in room with pt on exit) General Behavior During Session: Flat affect Cognition: Impaired Cognitive Impairment: short term memory deficits noted  Delorse Lek 09/11/2011, 9:25 AM

## 2011-09-11 NOTE — Progress Notes (Signed)
1 Day Post-Op  1 Day Post-Op  Subjective: Patient has comprehension and communication difficulties.  No complaints.  Tachycardia noted and EKG reviewed.  HG is 8.9  Objective: Vital signs in last 24 hours: Temp:  [98 F (36.7 C)-100.7 F (38.2 C)] 100.7 F (38.2 C) (11/17 0909) Pulse Rate:  [96-138] 138  (11/17 0919) Resp:  [16-24] 18  (11/17 0909) BP: (90-145)/(59-78) 126/67 mmHg (11/17 0909) SpO2:  [97 %-100 %] 100 % (11/17 0546) Weight:  [254 lb (115.214 kg)] 254 lb (115.214 kg) (11/16 1349)   Intake/Output from previous day: 11/16 0701 - 11/17 0700 In: 3465 [I.V.:3465] Out: 2350 [Urine:2150; Blood:200] Intake/Output this shift:    Lungs:  shallow, no ronchi  Heart:  sinus tach,  Abdomen:  nontender  Right foot:  good color and wiggles approrpiately  Lab Results:   Basename 09/11/11 0600 09/10/11 0934  WBC 7.1 7.5  HGB 8.9* 10.6*  HCT 27.2* 32.0*  PLT 314 307   BMET  Basename 09/11/11 0600 09/09/11 0640  NA 133* 136  K 4.5 3.9  CL 99 98  CO2 28 28  GLUCOSE 117* 93  BUN 13 16  CREATININE 0.65 0.66  CALCIUM 8.3* 9.3   PT/INR No results found for this basename: LABPROT:2,INR:2 in the last 72 hours  Studies/Results: Dg Knee 1-2 Views Right  09/10/2011  *RADIOLOGY REPORT*  Clinical Data: Tibial plateau fracture.  RIGHT KNEE - 1-2 VIEW  Comparison: 09/03/2011  Findings: Two fixation plate so and screws have been placed in the proximal tibial metaphysis, transfixing comminuted tibial plateau fractures in anatomic alignment.  Alignment bones is normal.  A nondisplaced fracture of the proximal fibula is again noted.  IMPRESSION: Internal fixation of comminuted tibial plateau fracture in anatomic alignment.  Original Report Authenticated By: Danae Orleans, M.D.   Dg Knee Right Port  09/10/2011  *RADIOLOGY REPORT*  Clinical Data: Postoperative radiograph.  PORTABLE RIGHT KNEE - 1-2 VIEW  Comparison: 09/10/2011 intraoperative radiographs and 09/03/2011 preoperative  CT  Findings: Medial and posterior plate and screw fixation of the comminuted tibial plateau fracture, without immediate evidence for hardware complication.  Fracture involving the tibial spine again noted.  Fibular head fracture.  The lateral joint space appears mildly widened however evaluation is limited by rotation.  IMPRESSION: Status post plate and screw fixation of the tibial plateau fracture.  No evidence for hardware complication.  Original Report Authenticated By: Waneta Martins, M.D.   Dg C-arm Gt 120 Min  09/10/2011  CLINICAL DATA: orif   C-ARM GT 120 MIN  Fluoroscopy was utilized by the requesting physician.  No radiographic  interpretation.      Anti-infectives: Anti-infectives     Start     Dose/Rate Route Frequency Ordered Stop   09/10/11 2300   vancomycin (VANCOCIN) IVPB 1000 mg/200 mL premix        1,000 mg 200 mL/hr over 60 Minutes Intravenous Every 12 hours 09/10/11 2253 09/11/11 2259   09/10/11 0800   vancomycin (VANCOCIN) IVPB 1000 mg/200 mL premix        1,000 mg 200 mL/hr over 60 Minutes Intravenous  Once 09/09/11 1405 09/10/11 0959   09/09/11 1000   vancomycin (VANCOCIN) IVPB 1000 mg/200 mL premix  Status:  Discontinued        1,000 mg 200 mL/hr over 60 Minutes Intravenous  Once 09/08/11 0900 09/09/11 1401   09/09/11 0800   vancomycin (VANCOCIN) IVPB 1000 mg/200 mL premix  Status:  Discontinued  1,000 mg 200 mL/hr over 60 Minutes Intravenous  Once 09/09/11 1403 09/09/11 1403          Assessment/Plan: d/c foley Continue clear liquids 1 Day Post-Op    LOS: 8 days    Matt B. Daphine Deutscher, MD, [facility] [phone] beeper [phone]  09/11/2011 10:21 AM
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
## 2011-09-13 ENCOUNTER — Inpatient Hospital Stay (HOSPITAL_COMMUNITY): Payer: Medicare Other

## 2011-09-13 LAB — BASIC METABOLIC PANEL
BUN: 9 mg/dL (ref 6–23)
CO2: 27 mEq/L (ref 19–32)
Calcium: 9.2 mg/dL (ref 8.4–10.5)
Creatinine, Ser: 0.58 mg/dL (ref 0.50–1.10)
GFR calc Af Amer: >90 mL/min (ref >90)

## 2011-09-13 LAB — CBC
HCT: 27.1 % — ABNORMAL LOW (ref 36.0–46.0)
MCH: 28.8 pg (ref 26.0–34.0)
MCV: 88.6 fL (ref 78.0–100.0)
Platelets: 424 10*3/uL — ABNORMAL HIGH (ref 150–400)
RDW: 12.5 % (ref 11.5–15.5)

## 2011-09-13 MED ORDER — BISACODYL 5 MG PO TBEC: 10.0000 mg | DELAYED_RELEASE_TABLET | Freq: Every day | ORAL | Status: AC

## 2011-09-13 MED ORDER — POLYETHYLENE GLYCOL 3350 17 G PO PACK: 17.0000 g | PACK | Freq: Every day | ORAL | Status: AC

## 2011-09-13 MED ORDER — HYDROCODONE-ACETAMINOPHEN 5-325 MG PO TABS: 0.5000 | ORAL_TABLET | ORAL | Status: DC | PRN

## 2011-09-13 MED ORDER — DSS 100 MG PO CAPS: 200.0000 mg | ORAL_CAPSULE | Freq: Two times a day (BID) | ORAL | Status: AC

## 2011-09-13 MED ORDER — OXYCODONE-ACETAMINOPHEN 10-325 MG PO TABS: 1.0000 | ORAL_TABLET | ORAL | Status: DC | PRN

## 2011-09-13 MED ORDER — HYDROMORPHONE HCL PF 1 MG/ML IJ SOLN: 0.5000 mg | INTRAMUSCULAR | Status: DC | PRN

## 2011-09-13 MED ORDER — METHOCARBAMOL 500 MG PO TABS: 500.0000 mg | ORAL_TABLET | Freq: Four times a day (QID) | ORAL | Status: AC | PRN

## 2011-09-13 MED ORDER — OXYCODONE-ACETAMINOPHEN 5-325 MG PO TABS: 1.0000 | ORAL_TABLET | ORAL | Status: DC | PRN

## 2011-09-13 MED ORDER — OXYCODONE HCL 5 MG PO TABS
5.0000 mg | ORAL_TABLET | ORAL | Status: DC | PRN
  Administered 2011-09-13: 15 mg via ORAL
  Filled 2011-09-13: qty 3

## 2011-09-13 MED ORDER — ENOXAPARIN SODIUM 30 MG/0.3ML ~~LOC~~ SOLN: 30.0000 mg | Freq: Two times a day (BID) | SUBCUTANEOUS | Status: DC

## 2011-09-13 NOTE — Progress Notes (Signed)
CSW met with pt and pt's mother @ bedside. Pt. Was no very responsive and was unable to verbalize her choice on SNF offers. Pt's mother indicated that she would like a bed at [facility]. CSW will continue to help facilitate this d/c. CSW contacted Charma Igo, [place], to find out if pt. Is medically appropriate for d/c today.

## 2011-09-13 NOTE — Discharge Summary (Signed)
This patient has been seen and I agree with the findings and treatment plan.  Anthea Udovich O. Daylee Delahoz, III, MD, FACS [phone] (pager) [phone] (direct pager) Trauma Surgeon  

## 2011-09-13 NOTE — Progress Notes (Signed)
This patient has been seen and I agree with the findings and treatment plan.  Fendi Meinhardt O. Chino Sardo, III, MD, FACS [phone] (pager) [phone] (direct pager) Trauma Surgeon  

## 2011-09-13 NOTE — Progress Notes (Signed)
Patient has had two episodes of incontinent stool overnight and does not call out for the bedpan. Patient also pulled out her IV in her right hand. Will continue to monitor.
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
## 2011-09-13 NOTE — Progress Notes (Signed)
Occupational Therapy Treatment  Patient Details Name: Allison Moore MRN: [id number] DOB: [birth year] Today's Date: 09/13/2011  OT Assessment/Plan OT Assessment/Plan Comments on Treatment Session: Pt unable to recall question that PT asked about pain. pt required face chart to rate pain. pt flat affect with no change of expressions. pt repeating back to OT with echolia given options. OT Plan: Discharge plan remains appropriate OT Frequency: Min 1X/week Follow Up Recommendations: Skilled nursing facility Equipment Recommended: Defer to next venue OT Goals Acute Rehab OT Goals OT Goal Formulation: With patient Time For Goal Achievement: 2 weeks ADL Goals Pt Will Perform Upper Body Bathing: with modified independence;Sitting at sink;Supported ADL Goal: Product manager - Progress: Not addressed Pt Will Perform Lower Body Bathing: with max assist;Sit to stand from bed ADL Goal: Lower Body Bathing - Progress: Not addressed Pt Will Perform Upper Body Dressing: with modified independence;Sitting, bed ADL Goal: Upper Body Dressing - Progress: Not addressed Pt Will Perform Lower Body Dressing: with max assist;Sit to stand from bed ADL Goal: Lower Body Dressing - Progress: Not addressed Pt Will Transfer to Toilet: with 2+ total assist;Other (comment);Squat pivot transfer;Extra wide 3-in-1;Maintaining weight bearing status ADL Goal: Toilet Transfer - Progress: Not met (simulated with EOB<>chair) Miscellaneous OT Goals Miscellaneous OT Goal #1: Pt will perform bed mobility supine<> sit EOB Min A with RLE supported as precursor to ADLS (minimal bed rail use) OT Goal: Miscellaneous Goal #1 - Progress: Not met  OT Treatment Precautions/Restrictions  Precautions Precautions: Knee Precaution Comments: LLE Ankle Air cast Required Braces or Orthoses: Yes Knee Immobilizer: Other (comment) Other Brace/Splint: Right LE Bledsoe brace; can be unlocked for ROM Restrictions Weight Bearing  Restrictions: Yes RLE Weight Bearing: Non weight bearing LLE Weight Bearing: Weight bearing as tolerated Other Position/Activity Restrictions: Air cast L ankle   ADL ADL Equipment Used: Rolling walker ADL Comments: Pt currently with adult brief and required total A with UB/LB bathing and dressing. Pt with delayed progressing. Mobility  Bed Mobility Bed Mobility: Yes Supine to Sit: HOB flat;With rails;1: +2 Total assist;Patient percentage (comment) Supine to Sit Details (indicate cue type and reason): pt required sequencing with step by step cues. Pt requires visual, auditory and tactile cues Sitting - Scoot to Edge of Bed: 3: Mod assist Sitting - Scoot to Edge of Bed Details (indicate cue type and reason): Pt required tactile cue and use of pad. Pt required visual cue to wait shift to have max A Transfers Sit to Stand: 1: +2 Total assist;From elevated surface;Patient percentage (comment) Sit to Stand Details (indicate cue type and reason): pt unable to recall weightbearing status NWB RLE pt required Max A to prevent weight bearing Stand to Sit: 1: +2 Total assist;Patient percentage (comment);To chair/3-in-1;With armrests Stand to Sit Details: +2 A for descend to chair. pt unable to follow 2 step instructions to descend to chair Exercises    End of Session OT - End of Session Equipment Utilized During Treatment: Gait belt;Right knee immobilizer;Other (comment) (LLE air cast) Activity Tolerance: Patient limited by pain Patient left: in chair;with call bell in reach;Other (comment) (hoyer pad under pt for RN to lift pt back to bed) Nurse Communication: Mobility status for transfers;Mobility status for ambulation;Need for lift equipment;Weight bearing status;Other (comment) (flat affect) General Behavior During Session: Flat affect Cognition: Impaired Cognitive Impairment: short term memory deficits noted (no recall of previous education on bed mobility/NWB RLE)  Lucile Shutters  09/13/2011, 3:02 PM Pager: [phone]

## 2011-09-13 NOTE — Progress Notes (Signed)
Physical Therapy Treatment Patient Details Name: Allison Moore MRN: [id number] DOB: [birth year] Today's Date: 09/13/2011  PT Assessment/Plan  PT - Assessment/Plan Comments on Treatment Session: successful OOB to chair transfer PT Plan: Frequency needs to be updated PT Frequency: Min 5X/week Follow Up Recommendations: Skilled nursing facility Equipment Recommended: Defer to next venue PT Goals  Acute Rehab PT Goals PT Goal: Supine/Side to Sit - Progress: Progressing toward goal (Slowly) PT Transfer Goal: Sit to Stand/Stand to Sit - Progress: Progressing toward goal (slowly) PT Transfer Goal: Bed to Chair/Chair to Bed - Progress: Progressing toward goal (slowly) Pt will Ambulate: 1 - 15 feet;with min assist;with rolling walker PT Goal: Ambulate - Progress: Revised (modified due to lack of progress/goal met) PT Goal: Up/Down Stairs - Progress: Discontinued (comment) (Plan is for dc to SNF; no need for stair training)  PT Treatment Precautions/Restrictions  Precautions Precautions: Knee Precaution Comments: LLE Ankle Air cast Required Braces or Orthoses: Yes Knee Immobilizer: Other (comment) (KI discontinued; pt now with Bledsoe brace) Other Brace/Splint: Right LE Bledsoe brace; can be unlocked for ROM Restrictions Weight Bearing Restrictions: Yes RLE Weight Bearing: Non weight bearing LLE Weight Bearing: Weight bearing as tolerated Other Position/Activity Restrictions: Air cast L ankle Mobility (including Balance) Bed Mobility Supine to Sit: HOB flat;With rails;1: +2 Total assist;Patient percentage (comment) (pt=70%) Supine to Sit Details (indicate cue type and reason): step-by-step cues for technique including elbow-prop to hand-prop to lift chest and shoulders off bed; constant A for Right LE Sitting - Scoot to Edge of Bed: 3: Mod assist Sitting - Scoot to Edge of Bed Details (indicate cue type and reason): very slow scooting; max encouragement (reminding and/or cues to stay  on task); L UE support on rail Transfers Sit to Stand: 1: +2 Total assist;From elevated surface;Patient percentage (comment) (pt=55%) Sit to Stand Details (indicate cue type and reason): physical assist to maintain NWB RLE; dependent on "countdown" and some momentum Stand to Sit: 1: +2 Total assist;Patient percentage (comment);To chair/3-in-1;With armrests (cues for armrest use/hand placement) Stand to Sit Details: required plus 2 assist for one to help control descent and get hands to armrests, and second person to maintain NWB RLE Stand Pivot Transfers: 1: +2 Total assist (Pt=50%; with RW) Stand Pivot Transfer Details (indicate cue type and reason): manual facilitation of L foot, trunk, body rotation to get to chair Ambulation/Gait Ambulation/Gait: No Stairs: No Wheelchair Mobility Wheelchair Mobility: No    Exercise    End of Session PT - End of Session Equipment Utilized During Treatment: Gait belt;Other (comment) (Right Bledsoe brace) Activity Tolerance: Patient limited by pain (RN aware) Patient left: in chair;with call bell in reach Nurse Communication: Need for lift equipment (pain) General Behavior During Session: Flat affect Cognition: Impaired Cognitive Impairment: short term memory deficits noted (increased time to answer questions)  Van Clines Hamff 09/13/2011, 1:00 PM [place], [place] [phone]
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
## 2011-09-14 ENCOUNTER — Encounter (HOSPITAL_COMMUNITY): Payer: Self-pay | Admitting: Orthopedic Surgery

## 2011-09-21 NOTE — Progress Notes (Signed)
I agree with the findings above.  Shakir Petrosino H 09/21/2011 9:35 AM

## 2011-09-21 NOTE — Consult Note (Signed)
I have seen and examined the patient. I agree with the findings above.  Tracey Stewart H 09/21/2011 9:32 AM

## 2011-09-21 NOTE — Progress Notes (Signed)
I agree with the findings above.  Allison Moore H 09/21/2011 9:33 AM

## 2011-09-27 ENCOUNTER — Ambulatory Visit: Payer: Medicare Other | Admitting: Advanced Practice Midwife

## 2011-10-28 ENCOUNTER — Encounter: Payer: Self-pay | Admitting: Family Medicine

## 2011-10-28 ENCOUNTER — Ambulatory Visit (INDEPENDENT_AMBULATORY_CARE_PROVIDER_SITE_OTHER): Payer: Medicare Other | Admitting: Family Medicine

## 2011-10-28 VITALS — BP 119/79 | HR 96 | Ht 64.0 in | Wt 236.0 lb

## 2011-10-28 DIAGNOSIS — R300 Dysuria: Secondary | ICD-10-CM

## 2011-10-28 DIAGNOSIS — G35 Multiple sclerosis: Secondary | ICD-10-CM

## 2011-10-28 DIAGNOSIS — Z23 Encounter for immunization: Secondary | ICD-10-CM

## 2011-10-28 DIAGNOSIS — R350 Frequency of micturition: Secondary | ICD-10-CM

## 2011-10-28 DIAGNOSIS — G35D Multiple sclerosis, unspecified: Secondary | ICD-10-CM

## 2011-10-28 NOTE — Progress Notes (Signed)
Addended by: Garen Grams F on: 10/28/2011 04:11 PM   Modules accepted: Orders

## 2011-10-28 NOTE — Assessment & Plan Note (Addendum)
Without dysuria since discharge from hospital 09/13/2011. Feels like last time she had UTI. Could not give urine sample today. Given urine sample cup and asked to provide sample of urine tomorrow morning and bring to clinic.  UPDATE: urine specimen submitted on 01/10. Urine culture showed no growth.

## 2011-10-28 NOTE — Assessment & Plan Note (Signed)
Documentation only. Followed by Neurologist.

## 2011-10-28 NOTE — Patient Instructions (Signed)
If your lab results are normal, I will send you a letter with the results. If abnormal, someone at the clinic will get in touch with you.   Follow-up in December 2013 for your next physical and Pap smear.
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
## 2011-11-01 ENCOUNTER — Telehealth: Payer: Self-pay | Admitting: Family Medicine

## 2011-11-01 NOTE — Telephone Encounter (Signed)
Needs verbal order for extending her PT - effective 10/27/11  Needs it to say, 3 x week for 5 weeks & 2 x week for 2 weeks

## 2011-11-01 NOTE — Telephone Encounter (Signed)
Ok with Verbal Order?

## 2011-11-02 ENCOUNTER — Telehealth: Payer: Self-pay | Admitting: Family Medicine

## 2011-11-02 NOTE — Telephone Encounter (Signed)
Requesting orders to continue home health services for nursing and for social work consult.

## 2011-11-02 NOTE — Telephone Encounter (Signed)
Home health called back to find out about orders and I read to her the message that Dr Madolyn Frieze gave.

## 2011-11-02 NOTE — Telephone Encounter (Signed)
Allison Moore was informed that verbal order was give.  She will fax over the order to be sign as well.  The only thing is that the order cant be signed by a resident, MUST BE SIGNED BY FACULTY per Allison Moore. [place], [name]

## 2011-11-02 NOTE — Telephone Encounter (Signed)
Yes, thank you Shanda Bumps.

## 2011-11-02 NOTE — Telephone Encounter (Signed)
Verbal orders given  

## 2011-11-02 NOTE — Telephone Encounter (Signed)
Will forward to MD who saw her for HFU

## 2011-11-04 ENCOUNTER — Telehealth: Payer: Self-pay | Admitting: *Deleted

## 2011-11-04 NOTE — Telephone Encounter (Signed)
[facility] Nurse) requesting order for urinalysis and urine culture.  States therapist went out to see Ms. Roadcap today and patient has not urinated since yesterday afternoon.  Found  patient alone at home when they arrived.  Also wanted [place] to know that the patients does not have any running water so she will be going to stay at her grandmother's home.  They have requested a social worker to go out and assess the situation.  Verbal order given to do UA & Culture per Dr. Madolyn Frieze.  Sheralyn Boatman will go out later today to collect sample.  Wants to give Yardley some time to get settled at her grandmothers.  Will forward message to Dr. Madolyn Frieze to make her aware of the home situation.  Ileana Ladd

## 2011-11-08 ENCOUNTER — Telehealth: Payer: Self-pay | Admitting: Family Medicine
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
## 2011-11-08 NOTE — Telephone Encounter (Signed)
Very unsafe by herself and needs continued help Child psychotherapist.  Needs verbal orders to continue for several weeks - needs to meet with family to determine what this patient needs.  [facility] home health - Child psychotherapist

## 2011-11-15 ENCOUNTER — Telehealth: Payer: Self-pay | Admitting: Family Medicine

## 2011-11-15 NOTE — Telephone Encounter (Signed)
Is wanting a refill on Oxycodone.
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
## 2011-11-17 ENCOUNTER — Ambulatory Visit (INDEPENDENT_AMBULATORY_CARE_PROVIDER_SITE_OTHER): Payer: Medicare Other | Admitting: Family Medicine

## 2011-11-17 ENCOUNTER — Encounter: Payer: Self-pay | Admitting: Family Medicine

## 2011-11-17 VITALS — BP 116/77 | HR 75 | Temp 98.6°F | Ht 64.0 in | Wt 236.0 lb

## 2011-11-17 DIAGNOSIS — T148XXA Other injury of unspecified body region, initial encounter: Secondary | ICD-10-CM

## 2011-11-17 DIAGNOSIS — T1490XA Injury, unspecified, initial encounter: Secondary | ICD-10-CM

## 2011-11-17 HISTORY — DX: Other injury of unspecified body region, initial encounter: T14.8XXA

## 2011-11-17 MED ORDER — ACETAMINOPHEN ER 650 MG PO TBCR: 650.0000 mg | EXTENDED_RELEASE_TABLET | Freq: Three times a day (TID) | ORAL | Status: DC | PRN

## 2011-11-17 MED ORDER — METHOCARBAMOL 500 MG PO TABS: 500.0000 mg | ORAL_TABLET | Freq: Four times a day (QID) | ORAL | Status: DC | PRN

## 2011-11-17 MED ORDER — DICLOFENAC SODIUM 75 MG PO TBEC: 75.0000 mg | DELAYED_RELEASE_TABLET | Freq: Two times a day (BID) | ORAL | Status: DC

## 2011-11-17 NOTE — Patient Instructions (Addendum)
Thank you for coming in to see me today.  Please finish the percocet and start with using tylenol, voltaren and robaxin as needed for pain. It is good to take tylenol or voltaren an hour before PT/doing exercises.   For constipation: miralax and the stool softener will help when used consistently. In addition you should see improvement once you are no longer taking the percocet. Also drink plenty of water and eat plenty of fiber (fruits (prunes) and vegetables).   -Dr. Armen Pickup

## 2011-11-19 ENCOUNTER — Telehealth: Payer: Self-pay | Admitting: Family Medicine

## 2011-11-19 NOTE — Telephone Encounter (Signed)
Needs verbal orders for them to continue with her home health care - they are concerned about her taking her meds correctly They are looking to try to place her in a group home

## 2011-11-22 NOTE — Telephone Encounter (Signed)
Gave updated verbal order for home behavioral health assessment.   This patient will need an assigned PCP. I will assume her care.
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
## 2011-11-26 ENCOUNTER — Telehealth: Payer: Self-pay | Admitting: Family Medicine

## 2011-11-26 NOTE — Telephone Encounter (Signed)
Needs orders to extend social work services for 3 more times

## 2011-11-29 ENCOUNTER — Encounter: Payer: Self-pay | Admitting: Family Medicine

## 2011-11-29 NOTE — Telephone Encounter (Signed)
Would like to extend SW services for 3 more weeks in order to facilitate placement by [facility] in a Family care home.   Jenise will drop or mail FL2 for placement.

## 2011-12-15 ENCOUNTER — Telehealth: Payer: Self-pay | Admitting: Family Medicine

## 2011-12-15 NOTE — Telephone Encounter (Signed)
Wants to continue Home Health RN and therapies continuing with previous orders.  Just need a verbal and that can be left on voicemail.

## 2011-12-15 NOTE — Telephone Encounter (Signed)
Gave a verbal order to continue home health RN services.

## 2011-12-17 ENCOUNTER — Telehealth: Payer: Self-pay | Admitting: Family Medicine

## 2011-12-17 NOTE — Telephone Encounter (Signed)
Was unable to meet with patient and mother today due to mother of patients requesting appt for next week, because she was unable to get off work to meet with worker.  Need mother there for appt due to patient's mental status.  Also need to establish what provider will be signing orders.   Need to recert patient and need to have a provider authorization for this.

## 2011-12-20 ENCOUNTER — Telehealth: Payer: Self-pay | Admitting: Family Medicine

## 2011-12-20 NOTE — Telephone Encounter (Signed)
Needs orders to continue PT for 5 wks - 2x weekly

## 2011-12-20 NOTE — Telephone Encounter (Signed)
Verbal order given to continue PT given.

## 2011-12-23 ENCOUNTER — Other Ambulatory Visit (HOSPITAL_COMMUNITY)
Admission: RE | Admit: 2011-12-23 | Discharge: 2011-12-23 | Disposition: A | Discharge status: Home / Self Care | Payer: Medicare Other | Source: Ambulatory Visit | Attending: Family Medicine | Admitting: Family Medicine

## 2011-12-23 ENCOUNTER — Ambulatory Visit (INDEPENDENT_AMBULATORY_CARE_PROVIDER_SITE_OTHER): Payer: Medicare Other | Admitting: Family Medicine

## 2011-12-23 ENCOUNTER — Encounter: Payer: Self-pay | Admitting: Family Medicine

## 2011-12-23 VITALS — BP 123/80 | HR 73 | Temp 98.2°F | Ht 64.0 in | Wt 237.0 lb

## 2011-12-23 DIAGNOSIS — R300 Dysuria: Secondary | ICD-10-CM

## 2011-12-23 DIAGNOSIS — N390 Urinary tract infection, site not specified: Secondary | ICD-10-CM | POA: Insufficient documentation

## 2011-12-23 DIAGNOSIS — Z202 Contact with and (suspected) exposure to infections with a predominantly sexual mode of transmission: Secondary | ICD-10-CM

## 2011-12-23 DIAGNOSIS — N760 Acute vaginitis: Secondary | ICD-10-CM

## 2011-12-23 DIAGNOSIS — A5901 Trichomonal vulvovaginitis: Secondary | ICD-10-CM

## 2011-12-23 DIAGNOSIS — A599 Trichomoniasis, unspecified: Secondary | ICD-10-CM | POA: Insufficient documentation

## 2011-12-23 DIAGNOSIS — Z113 Encounter for screening for infections with a predominantly sexual mode of transmission: Secondary | ICD-10-CM | POA: Insufficient documentation

## 2011-12-23 LAB — POCT UA - MICROSCOPIC ONLY: RBC, urine, microscopic: >20

## 2011-12-23 LAB — POCT URINALYSIS DIPSTICK
Glucose, UA: NEGATIVE
Nitrite, UA: NEGATIVE
Protein, UA: NEGATIVE
Urobilinogen, UA: 1

## 2011-12-23 LAB — POCT WET PREP (WET MOUNT)

## 2011-12-23 MED ORDER — SULFAMETHOXAZOLE-TMP DS 800-160 MG PO TABS: 1.0000 | ORAL_TABLET | Freq: Two times a day (BID) | ORAL | Status: AC

## 2011-12-23 MED ORDER — METRONIDAZOLE 500 MG PO TABS: 500.0000 mg | ORAL_TABLET | Freq: Two times a day (BID) | ORAL | Status: AC

## 2011-12-23 MED ORDER — FLUCONAZOLE 150 MG PO TABS: 150.0000 mg | ORAL_TABLET | Freq: Once | ORAL | Status: AC
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
## 2011-12-23 NOTE — Patient Instructions (Addendum)
Allison Moore,  Thank you for coming to see me today.  Please take the following for your infections:  1. BV/Trichomonas: flagyl one tab twice a day for 7 days.  2. UTI: bactrim one tab twice a day for 5 days.  If you develop a vaginal yeast infection (thick, itchy vaginal discharge) take the diflucan one tab once. May repeat in 2 days if needed.  Use condoms every time for protection against sexually transmitted infections.   F/u as needed.   Dr. Armen Pickup

## 2011-12-23 NOTE — Assessment & Plan Note (Signed)
A: Treat with Flagyl. This will also cover bacterial vaginosis.  P: -Obtain gonorrhea and Chlamydia cultures -Obtain HIV -Reviewed the last Pap smears All all have been normal. The most recent one was in 2011. She is due for repeat Pap in 2014

## 2011-12-23 NOTE — Assessment & Plan Note (Signed)
Will treat symptomatic UTI with Bactrim x5 days. Provided Ditropan for possible antibiotic induced candidal vaginitis.

## 2011-12-24 ENCOUNTER — Telehealth: Payer: Self-pay | Admitting: Family Medicine

## 2011-12-24 LAB — HIV ANTIBODY (ROUTINE TESTING W REFLEX): HIV: NONREACTIVE

## 2011-12-24 NOTE — Telephone Encounter (Signed)
Calling to ask if she can get an order for more visits. She is attempting to get her into group or family care. Laureen Ochs, Viann Shove

## 2011-12-24 NOTE — Telephone Encounter (Signed)
Called patient HIV negative.

## 2011-12-26 ENCOUNTER — Telehealth: Payer: Self-pay | Admitting: Family Medicine

## 2011-12-26 NOTE — Telephone Encounter (Signed)
GC and Chlamydia negative please call patient.

## 2011-12-27 NOTE — Telephone Encounter (Signed)
LM on identifiable VM  informing pt..............................Marland KitchenJone Baseman, CMA

## 2011-12-30 ENCOUNTER — Telehealth: Payer: Self-pay | Admitting: Family Medicine

## 2011-12-30 NOTE — Telephone Encounter (Addendum)
Called back to genise at [facility]. Left VM.  I will be happy to give a verbal order from additional home visits I just need to know what services are being provided and at what schedule (# of visits per week and for how many visits).  I requested a return call to the clinic with this information.

## 2011-12-30 NOTE — Telephone Encounter (Signed)
Verbal order provided for home SW q weekly as needed for group home placement.

## 2011-12-30 NOTE — Telephone Encounter (Signed)
Returning Dr. Armen Pickup call for a verbal order for MSW through Home Care.

## 2012-01-05 ENCOUNTER — Telehealth: Payer: Self-pay | Admitting: Family Medicine

## 2012-01-05 NOTE — Telephone Encounter (Signed)
MSW is calling to make Dr. Armen Pickup aware that the patient is being discharged from [facility] because she will be getting PT outpatient.  She is also asking if there is MSW network that she can use through [facility].  She is going to continue what she has been doing by phone.  Genise would like to speak to Dr. Armen Pickup about more details.

## 2012-01-07 NOTE — Telephone Encounter (Signed)
Called  Back. Left VM stating that we do have SW through our clinic that may be able to assist with group home placement. I will get the patient plugged in with our CSW.

## 2012-01-10 ENCOUNTER — Telehealth: Payer: Self-pay | Admitting: Clinical

## 2012-01-10 ENCOUNTER — Ambulatory Visit: Payer: Medicare Other | Attending: Orthopedic Surgery

## 2012-01-10 DIAGNOSIS — R262 Difficulty in walking, not elsewhere classified: Secondary | ICD-10-CM | POA: Insufficient documentation

## 2012-01-10 DIAGNOSIS — IMO0001 Reserved for inherently not codable concepts without codable children: Secondary | ICD-10-CM | POA: Insufficient documentation

## 2012-01-10 DIAGNOSIS — M25669 Stiffness of unspecified knee, not elsewhere classified: Secondary | ICD-10-CM | POA: Insufficient documentation

## 2012-01-10 DIAGNOSIS — M25569 Pain in unspecified knee: Secondary | ICD-10-CM | POA: Insufficient documentation

## 2012-01-10 DIAGNOSIS — R5381 Other malaise: Secondary | ICD-10-CM | POA: Insufficient documentation

## 2012-01-10 NOTE — Telephone Encounter (Signed)
Clinical Child psychotherapist (CSW) received a returned call from pt mother who stated she has been actively searching for a group home for pt. CSW explored what facilities pt has already made contact with however mother was unsure of the names. Pt mother stated she had trouble reaching several other facilities as the contact numbers she has are inaccurate. CSW emailed pt mother the group home list and encouraged her to call the ones she hasn't already called/visited. CSW inquired whether pt mother has an FL2 form  which will be required by the facilities, Pt mother states she has a completed FL2 from the clinic. Pt mother appreciative and agrees to contact CSW if she needs further assistance.  Theresia Bough, MSW, Theresia Majors [phone]

## 2012-01-10 NOTE — Telephone Encounter (Signed)
Clinical Child psychotherapist (CSW) received a referral to assist this pt in finding placement at a group home. CSW left a message for [facility] CSW inquiring whether she is assisting pt with placement. CSW contacted pt who stated they have seen several group homes and is waiting on a response from one of them. CSW also received consent from pt to contact pt mother regarding what assistance is needed. CSW left a message for pt mother. CSW will await call.  Theresia Bough, MSW, Theresia Majors [phone]

## 2012-01-18 ENCOUNTER — Telehealth: Payer: Self-pay | Admitting: Clinical

## 2012-01-18 NOTE — Telephone Encounter (Signed)
Clinical Child psychotherapist (CSW) made a referral to [facility] however CSW informed that pt does not have a qualifying diagnosis for [facility] services. CSW will inform pt mother and inquire whether pt case worker at [facility] is assisting with placement.  Theresia Bough, MSW, Theresia Majors [phone]

## 2012-01-24 ENCOUNTER — Ambulatory Visit: Payer: Medicare Other | Attending: Orthopedic Surgery

## 2012-01-24 DIAGNOSIS — R5381 Other malaise: Secondary | ICD-10-CM | POA: Insufficient documentation

## 2012-01-24 DIAGNOSIS — IMO0001 Reserved for inherently not codable concepts without codable children: Secondary | ICD-10-CM | POA: Insufficient documentation

## 2012-01-24 DIAGNOSIS — M25669 Stiffness of unspecified knee, not elsewhere classified: Secondary | ICD-10-CM | POA: Insufficient documentation

## 2012-01-24 DIAGNOSIS — M25569 Pain in unspecified knee: Secondary | ICD-10-CM | POA: Insufficient documentation

## 2012-01-24 DIAGNOSIS — R262 Difficulty in walking, not elsewhere classified: Secondary | ICD-10-CM | POA: Insufficient documentation

## 2012-01-25 ENCOUNTER — Other Ambulatory Visit: Payer: Self-pay | Admitting: Family Medicine

## 2012-01-25 NOTE — Telephone Encounter (Signed)
Patient is calling about her refill on COPAXONE, that needs to go to [facility] on [address].  She said they have faxed over the request, but have not heard back.

## 2012-01-27 ENCOUNTER — Ambulatory Visit: Payer: Medicare Other

## 2012-01-31 ENCOUNTER — Ambulatory Visit: Payer: Medicare Other

## 2012-02-01 ENCOUNTER — Telehealth: Payer: Self-pay | Admitting: Family Medicine

## 2012-02-01 NOTE — Telephone Encounter (Signed)
Patient dropped off form to be filled out for transportation.  Please call her when completed.  °

## 2012-02-01 NOTE — Telephone Encounter (Signed)
[employer] Professional Verification Form placed in Dr. Armen Pickup box for completion.  Ileana Ladd

## 2012-02-03 ENCOUNTER — Ambulatory Visit: Payer: Medicare Other

## 2012-02-07 ENCOUNTER — Ambulatory Visit: Payer: Medicare Other

## 2012-02-07 NOTE — Telephone Encounter (Signed)
Called patient for assistance completing [employer] form. Form completed and given to Terese Door.

## 2012-02-07 NOTE — Telephone Encounter (Signed)
[employer] forms completed.  [place] notified forms are ready to be picked up at front desk.  Ileana Ladd

## 2012-02-09 ENCOUNTER — Ambulatory Visit: Payer: Medicare Other

## 2012-02-14 ENCOUNTER — Ambulatory Visit: Payer: Medicare Other

## 2012-02-16 ENCOUNTER — Encounter: Payer: Medicare Other | Admitting: Physical Therapy

## 2012-02-16 ENCOUNTER — Ambulatory Visit: Payer: Medicare Other | Admitting: Physical Therapy

## 2012-02-21 ENCOUNTER — Ambulatory Visit: Payer: Medicare Other | Admitting: Physical Therapy

## 2012-02-22 ENCOUNTER — Other Ambulatory Visit: Payer: Self-pay | Admitting: Family Medicine

## 2012-02-23 ENCOUNTER — Ambulatory Visit: Payer: Medicare Other | Attending: Orthopedic Surgery | Admitting: Physical Therapy

## 2012-02-23 DIAGNOSIS — R5381 Other malaise: Secondary | ICD-10-CM | POA: Insufficient documentation

## 2012-02-23 DIAGNOSIS — IMO0001 Reserved for inherently not codable concepts without codable children: Secondary | ICD-10-CM | POA: Insufficient documentation

## 2012-02-23 DIAGNOSIS — R262 Difficulty in walking, not elsewhere classified: Secondary | ICD-10-CM | POA: Insufficient documentation

## 2012-02-23 DIAGNOSIS — M25569 Pain in unspecified knee: Secondary | ICD-10-CM | POA: Insufficient documentation

## 2012-02-23 DIAGNOSIS — M25669 Stiffness of unspecified knee, not elsewhere classified: Secondary | ICD-10-CM | POA: Insufficient documentation

## 2012-02-29 ENCOUNTER — Ambulatory Visit: Payer: Medicare Other | Admitting: Physical Therapy

## 2012-03-02 ENCOUNTER — Ambulatory Visit: Payer: Medicare Other | Admitting: Physical Therapy

## 2012-03-06 ENCOUNTER — Ambulatory Visit: Payer: Medicare Other | Admitting: Physical Therapy

## 2012-03-08 ENCOUNTER — Ambulatory Visit: Payer: Medicare Other | Admitting: Physical Therapy

## 2012-03-13 ENCOUNTER — Ambulatory Visit: Payer: Medicare Other | Admitting: Physical Therapy

## 2012-03-15 ENCOUNTER — Ambulatory Visit: Payer: Medicare Other | Admitting: Physical Therapy

## 2012-03-18 ENCOUNTER — Other Ambulatory Visit: Payer: Self-pay | Admitting: Family Medicine

## 2012-03-21 ENCOUNTER — Ambulatory Visit: Payer: Medicare Other | Admitting: Physical Therapy

## 2012-03-23 ENCOUNTER — Ambulatory Visit: Payer: Medicare Other | Admitting: Physical Therapy

## 2012-03-27 ENCOUNTER — Ambulatory Visit: Payer: Medicare Other | Admitting: Physical Therapy

## 2012-03-29 ENCOUNTER — Ambulatory Visit: Payer: Medicare Other | Admitting: Physical Therapy

## 2012-03-30 ENCOUNTER — Ambulatory Visit: Payer: Medicare Other | Attending: Orthopedic Surgery | Admitting: Physical Therapy

## 2012-03-30 ENCOUNTER — Ambulatory Visit: Payer: Medicare Other | Admitting: Physical Therapy

## 2012-03-30 DIAGNOSIS — R5381 Other malaise: Secondary | ICD-10-CM | POA: Insufficient documentation

## 2012-03-30 DIAGNOSIS — M25669 Stiffness of unspecified knee, not elsewhere classified: Secondary | ICD-10-CM | POA: Insufficient documentation

## 2012-03-30 DIAGNOSIS — M25569 Pain in unspecified knee: Secondary | ICD-10-CM | POA: Insufficient documentation

## 2012-03-30 DIAGNOSIS — IMO0001 Reserved for inherently not codable concepts without codable children: Secondary | ICD-10-CM | POA: Insufficient documentation

## 2012-03-30 DIAGNOSIS — R262 Difficulty in walking, not elsewhere classified: Secondary | ICD-10-CM | POA: Insufficient documentation

## 2012-04-03 ENCOUNTER — Ambulatory Visit: Payer: Medicare Other | Admitting: Physical Therapy

## 2012-04-04 ENCOUNTER — Ambulatory Visit (INDEPENDENT_AMBULATORY_CARE_PROVIDER_SITE_OTHER): Payer: Medicare Other | Admitting: Family Medicine

## 2012-04-04 ENCOUNTER — Encounter: Payer: Self-pay | Admitting: Family Medicine

## 2012-04-04 VITALS — BP 120/73 | HR 79 | Temp 98.5°F | Ht 64.0 in | Wt 235.0 lb

## 2012-04-04 DIAGNOSIS — R432 Parageusia: Secondary | ICD-10-CM

## 2012-04-04 DIAGNOSIS — T50904A Poisoning by unspecified drugs, medicaments and biological substances, undetermined, initial encounter: Secondary | ICD-10-CM

## 2012-04-04 DIAGNOSIS — T50901A Poisoning by unspecified drugs, medicaments and biological substances, accidental (unintentional), initial encounter: Secondary | ICD-10-CM

## 2012-04-04 DIAGNOSIS — T443X1A Poisoning by other parasympatholytics [anticholinergics and antimuscarinics] and spasmolytics, accidental (unintentional), initial encounter: Secondary | ICD-10-CM | POA: Insufficient documentation

## 2012-04-04 DIAGNOSIS — F259 Schizoaffective disorder, unspecified: Secondary | ICD-10-CM

## 2012-04-04 DIAGNOSIS — R439 Unspecified disturbances of smell and taste: Secondary | ICD-10-CM

## 2012-04-04 MED ORDER — GLYCOPYRROLATE 1 MG PO TABS: 1.0000 mg | ORAL_TABLET | Freq: Two times a day (BID) | ORAL | Status: DC

## 2012-04-04 NOTE — Patient Instructions (Signed)
Allison Moore,   Thank you for coming in today. Please take the robinol as needed for drooling up to twice daily. Please see me if your symptoms persist.   Dr. Armen Pickup

## 2012-04-04 NOTE — Assessment & Plan Note (Signed)
A: anticholinergic symptoms most likely from risperdal. P:  -treat symptoms with robinol -will route note to patient psychiatrist to consider possible dose adjustment of risperdal.

## 2012-04-04 NOTE — Progress Notes (Signed)
Subjective:     Patient ID: Allison Moore, female   DOB: 10-19-1981, 31 y.o.   MRN: [id number]  HPI 31 yo F with a past medical history significant for multiple sclerosis, schizoaffective disorder and bipolar disorder presents with a complaint of two week and increased salivation and loss of taste. She reports excessive salivation in the morning. She reports that she can taste food but almost everything taste bitter/sour. She denies visual changes, change in smell. She denies lacrimation, increased urinary frequency and GI upset including vomiting and diarrhea. She reports have bowel tendency towards constipation and for this she take miralax and docusate sodium.   She reports that her Zoloft was increased to 100 mg daily by her psychiatrist in April 2013 but denies no other medication changes.  Review of Systems As per HPI  Objective:   Physical Exam BP 120/73  Pulse 79  Temp(Src) 98.5 F (36.9 C) (Oral)  Ht 5\' 4"  (1.626 m)  Wt 235 lb (106.595 kg)  BMI 40.34 kg/m2 General appearance: alert, cooperative and no distress Eyes: conjunctivae/corneas clear. PERRL, EOM's intact.  Throat: lips, mucosa, and tongue normal; teeth and gums normal Neurologic: Mental status: Alert, oriented, thought content appropriate Cranial nerves: normal Motor: normal. Patient walks with a cane.  Sensory: normal.      Assessment and Plan:

## 2012-04-05 ENCOUNTER — Ambulatory Visit: Payer: Medicare Other | Admitting: Physical Therapy

## 2012-04-10 ENCOUNTER — Ambulatory Visit: Payer: Medicare Other | Admitting: Physical Therapy

## 2012-04-12 ENCOUNTER — Other Ambulatory Visit: Payer: Self-pay | Admitting: Orthopedic Surgery

## 2012-04-12 ENCOUNTER — Ambulatory Visit: Payer: Medicare Other | Admitting: Physical Therapy

## 2012-04-12 DIAGNOSIS — M25561 Pain in right knee: Secondary | ICD-10-CM

## 2012-04-14 ENCOUNTER — Ambulatory Visit: Payer: Medicare Other | Admitting: Physical Therapy

## 2012-04-20 ENCOUNTER — Inpatient Hospital Stay: Admission: RE | Admit: 2012-04-20 | Payer: Medicare Other | Source: Ambulatory Visit

## 2012-05-11 IMAGING — CR DG KNEE 1-2V PORT*R*
2 series · 2 of 2 positions shown · non-contrast
Comparison: 09/10/2011 intraoperative radiographs and 09/03/2011
preoperative CT

CLINICAL DATA: Postoperative radiograph.

PORTABLE RIGHT KNEE - 1-2 VIEW

[view not recorded (1 of 2)]
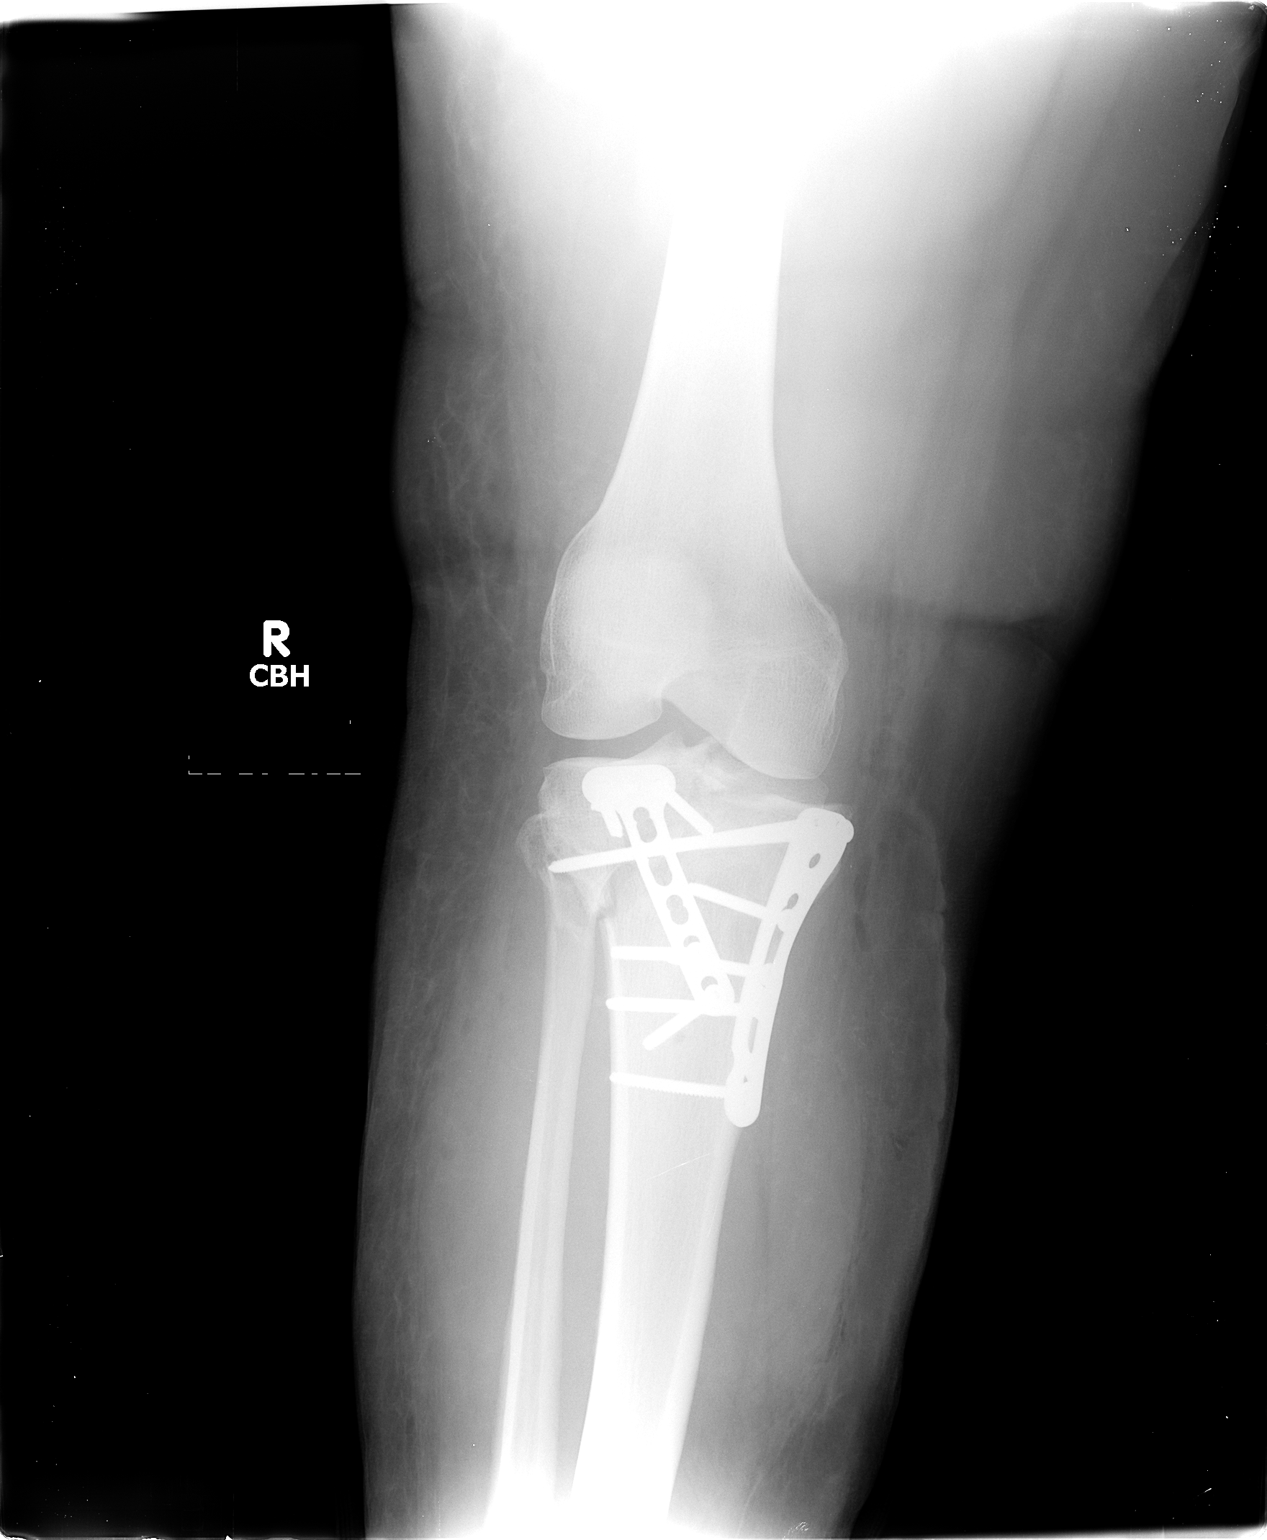

[view not recorded (2 of 2)]
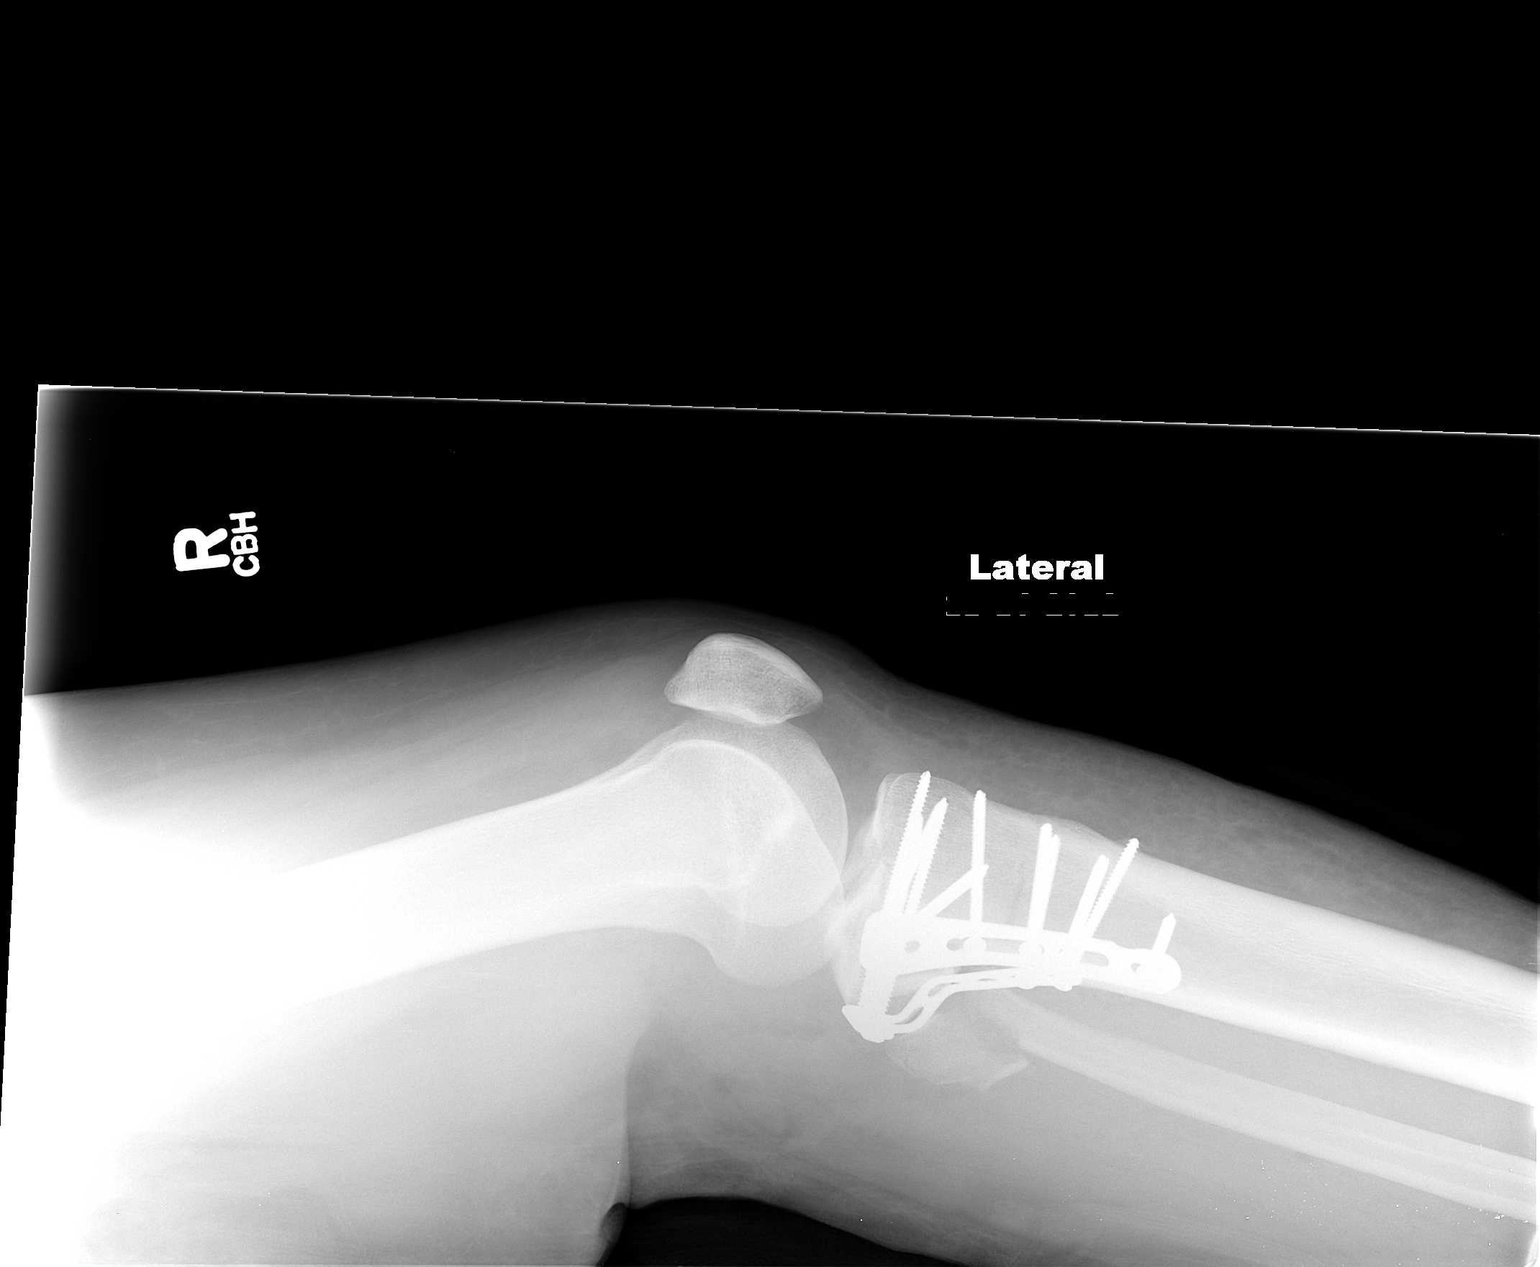

[2 of 2 positions shown; findings below may reference images not displayed]

FINDINGS: Medial and posterior plate and screw fixation of the
comminuted tibial plateau fracture, without immediate evidence for
hardware complication.  Fracture involving the tibial spine again
noted.  Fibular head fracture.  The lateral joint space appears
mildly widened however evaluation is limited by rotation.
IMPRESSION: Status post plate and screw fixation of the tibial plateau
fracture.  No evidence for hardware complication.

## 2012-05-14 IMAGING — CR DG PELVIS 3+V JUDET
3 series · 3 of 3 positions shown · non-contrast
Comparison: 09/05/2011

CLINICAL DATA: Pelvic fractures.

JUDET PELVIS - 3+ VIEW

[t pelvis ap]
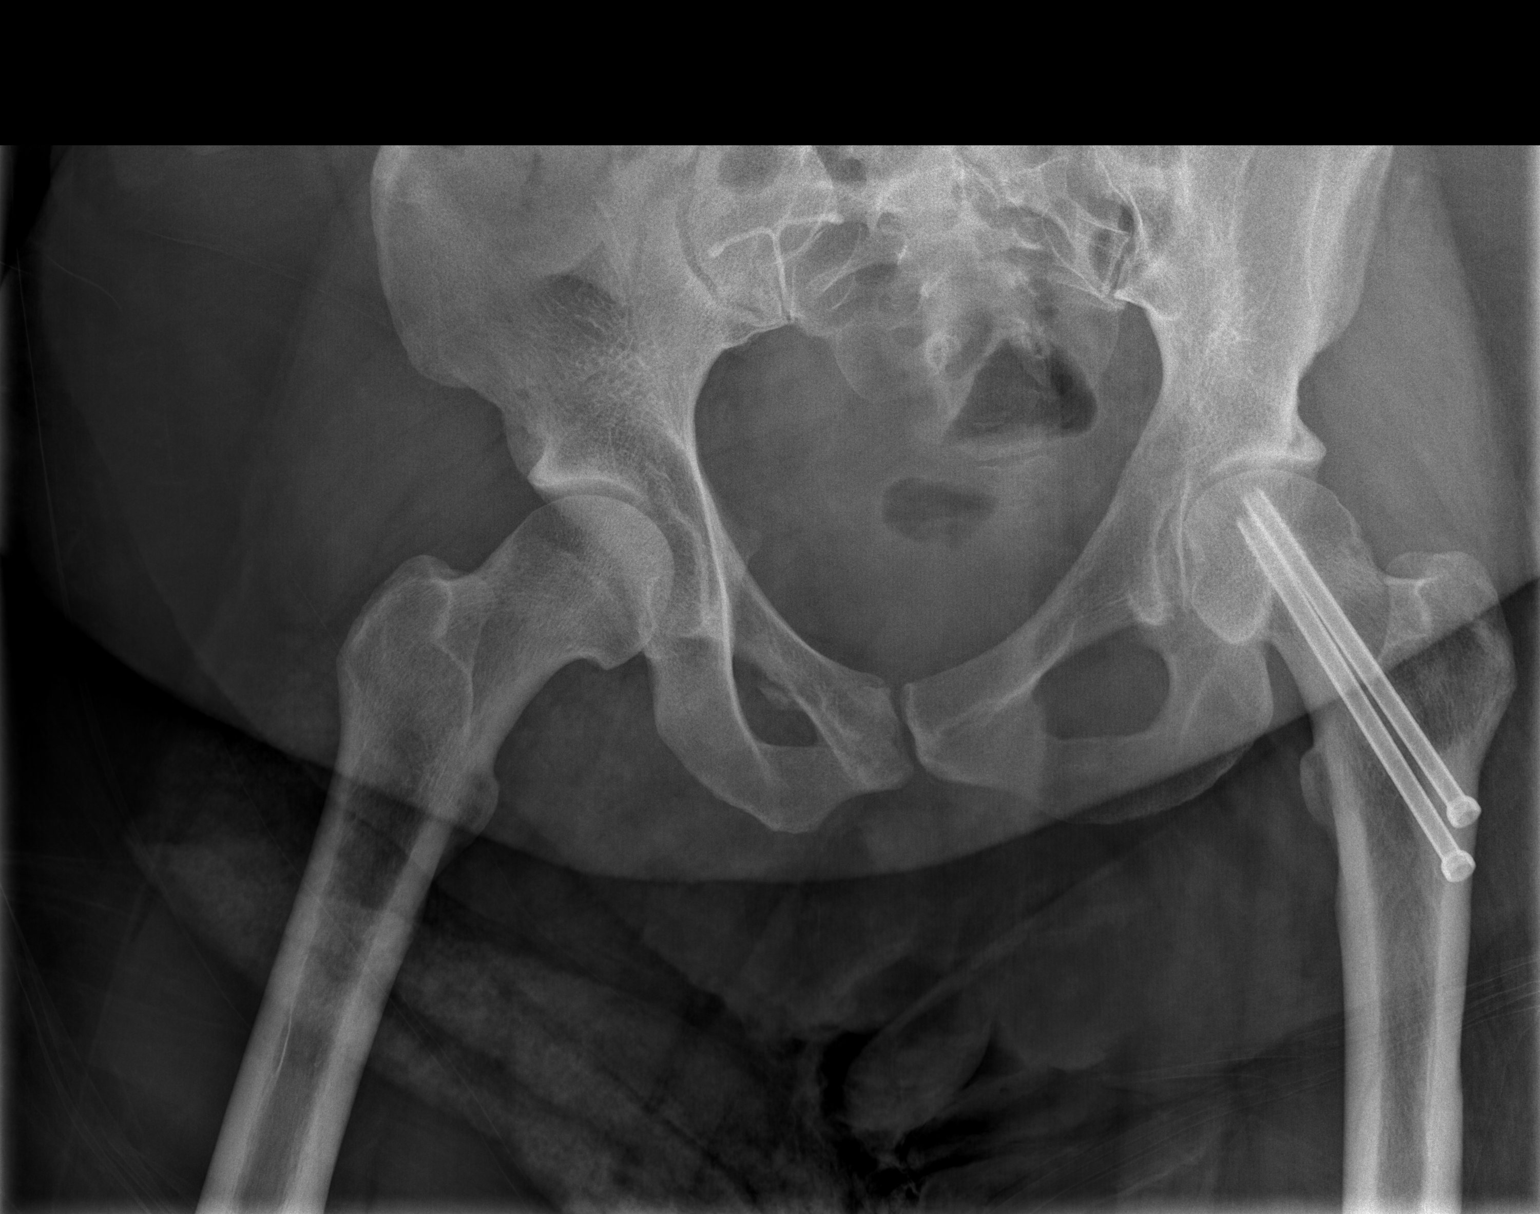

[t pelvis judet (1 of 2)]
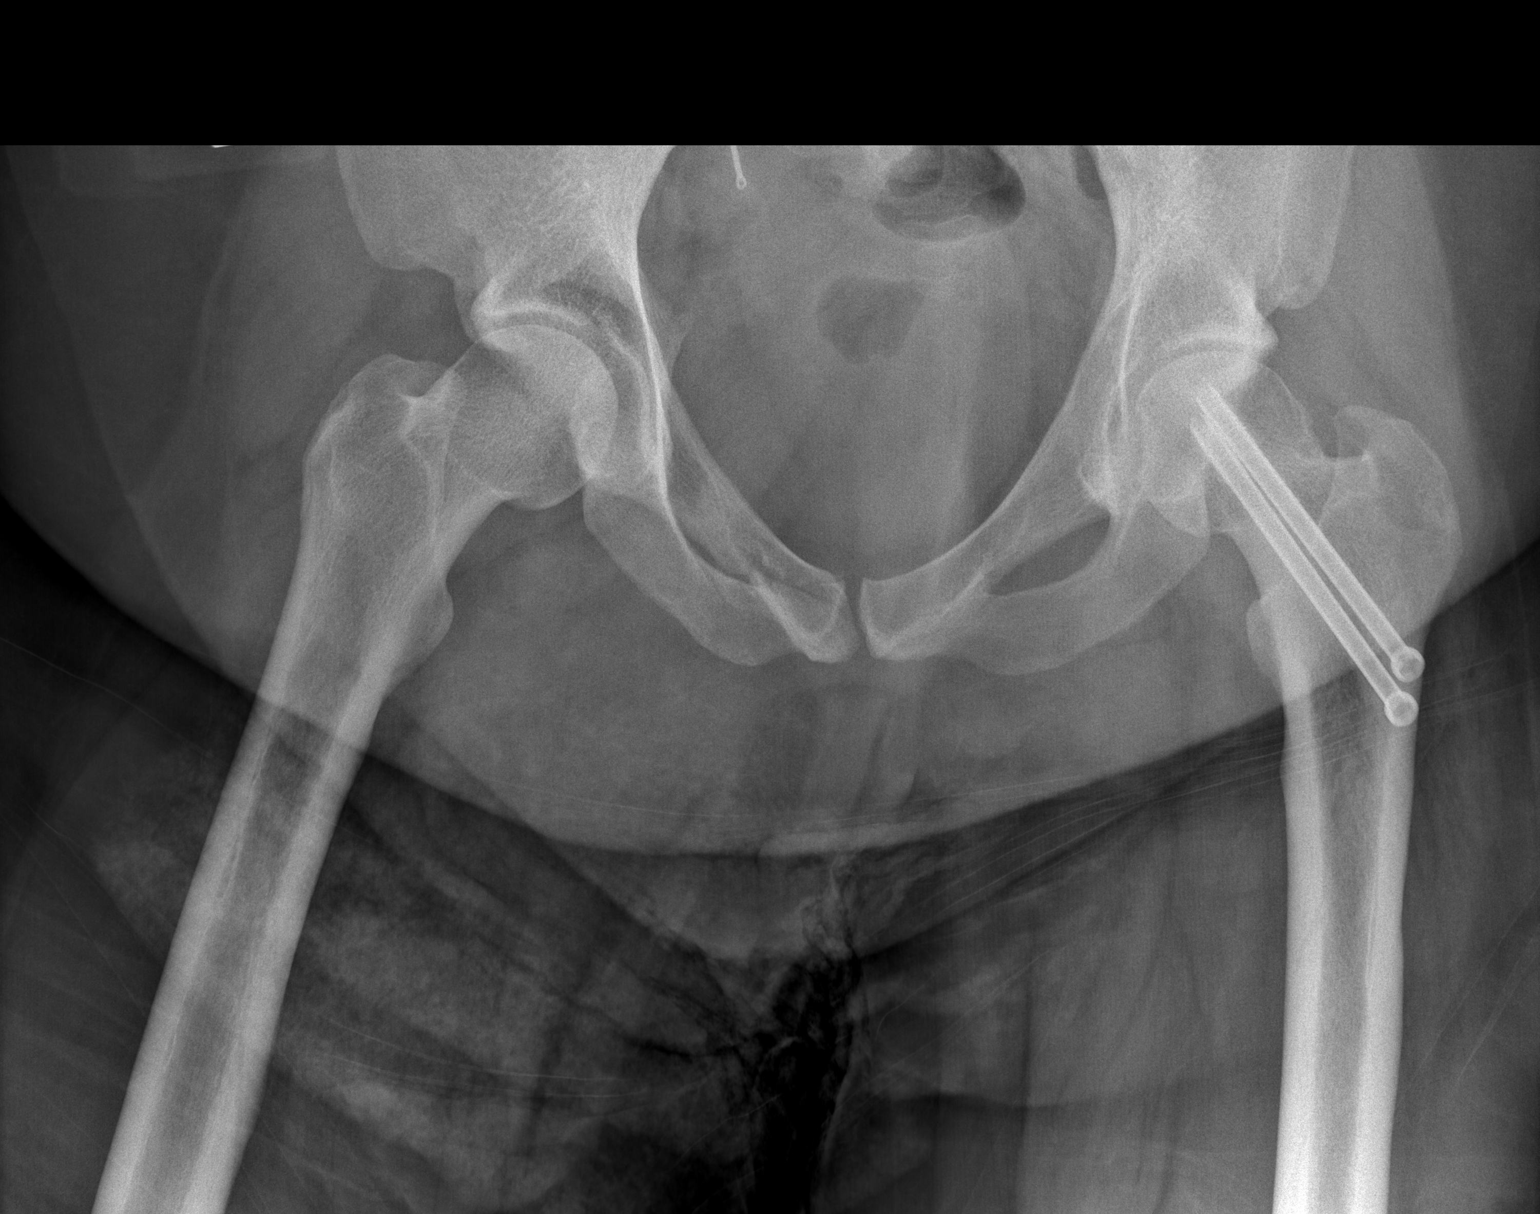

[t pelvis judet (2 of 2)]
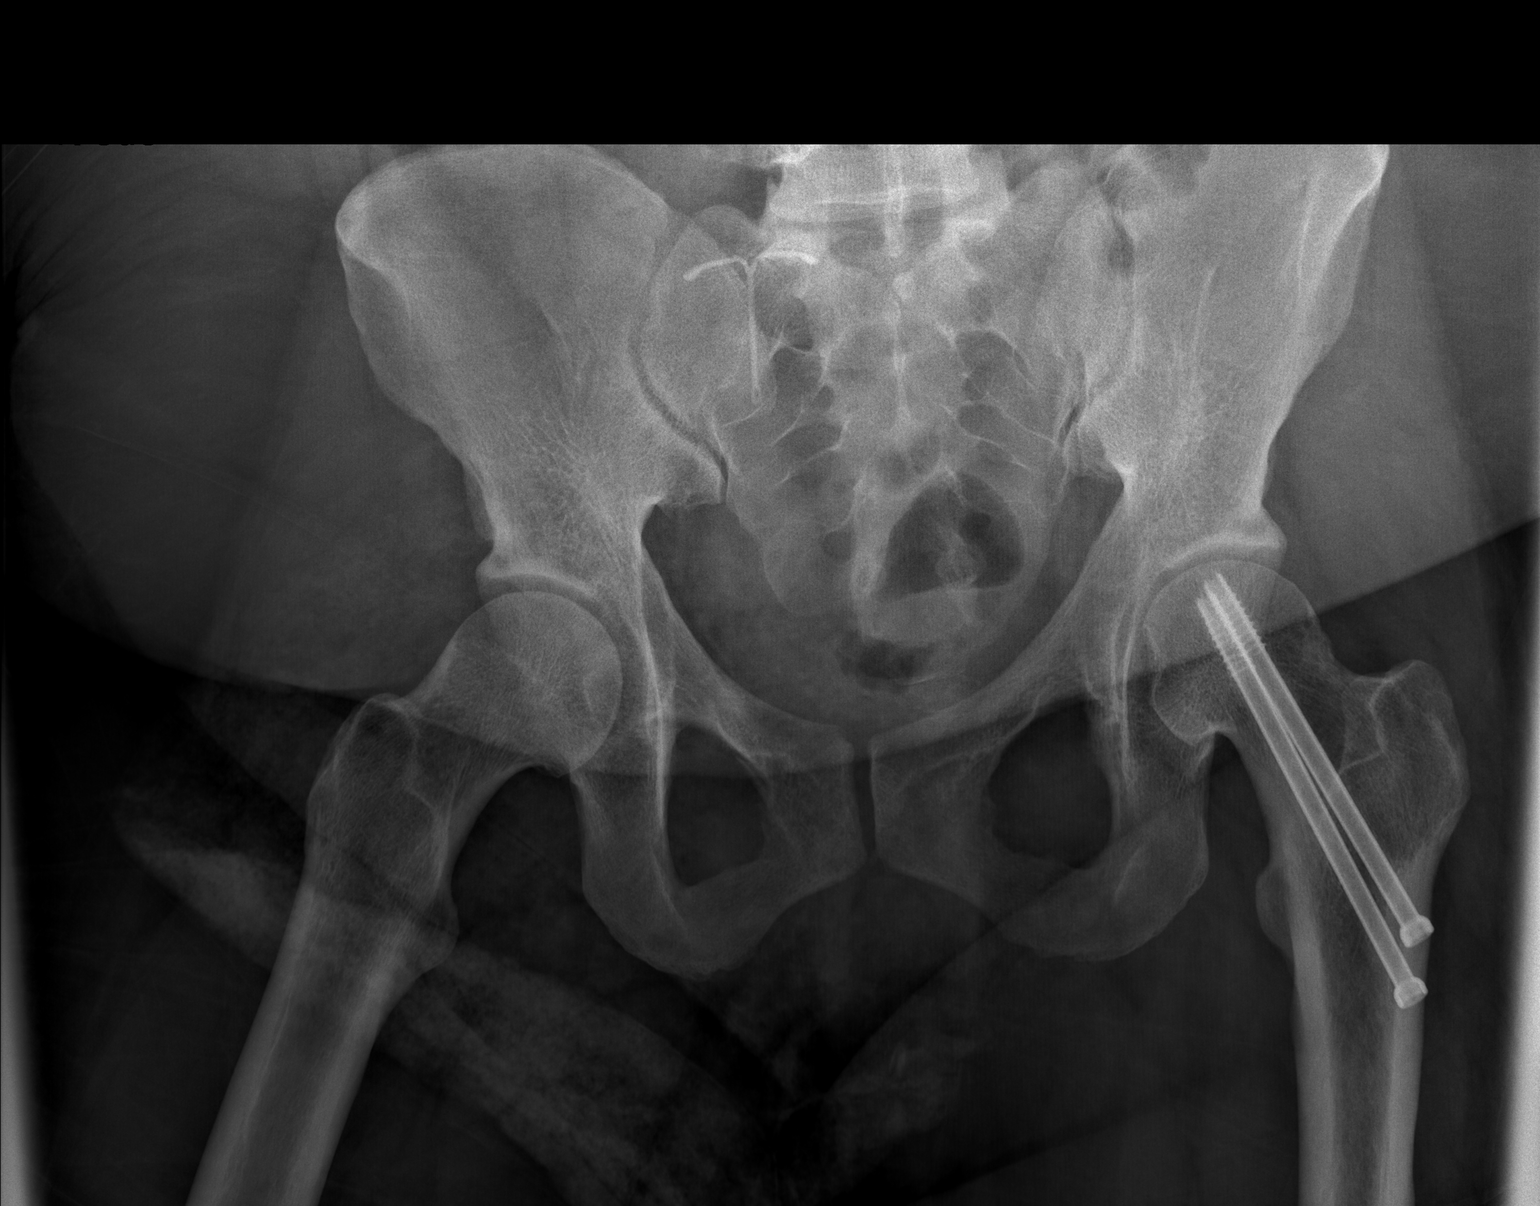

[3 of 3 positions shown; findings below may reference images not displayed]

FINDINGS: There has been no significant change in the appearance of
the horizontal fractures through the right side of the symphysis
extending into the inferior and superior pubic rami on the right.

The fracture of the left side of the sacrum is not appreciable on
these radiographs.  Fracture of the left transverse process of L5
is visible.
IMPRESSION: No significant change in the right pelvic fractures.

## 2012-05-21 ENCOUNTER — Other Ambulatory Visit: Payer: Self-pay | Admitting: Family Medicine

## 2012-05-25 DIAGNOSIS — B9620 Unspecified Escherichia coli [E. coli] as the cause of diseases classified elsewhere: Secondary | ICD-10-CM

## 2012-05-25 HISTORY — DX: Urinary tract infection, site not specified: B96.20

## 2012-06-22 ENCOUNTER — Ambulatory Visit (INDEPENDENT_AMBULATORY_CARE_PROVIDER_SITE_OTHER): Payer: Medicare Other | Admitting: Family Medicine

## 2012-06-22 ENCOUNTER — Encounter: Payer: Self-pay | Admitting: Family Medicine

## 2012-06-22 VITALS — BP 101/67 | HR 79 | Temp 98.3°F | Ht 64.0 in | Wt 245.5 lb

## 2012-06-22 DIAGNOSIS — R300 Dysuria: Secondary | ICD-10-CM

## 2012-06-22 DIAGNOSIS — N390 Urinary tract infection, site not specified: Secondary | ICD-10-CM

## 2012-06-22 LAB — POCT URINALYSIS DIPSTICK
Bilirubin, UA: NEGATIVE
Nitrite, UA: POSITIVE
Spec Grav, UA: 1.025
pH, UA: 6.5

## 2012-06-22 LAB — POCT UA - MICROSCOPIC ONLY

## 2012-06-22 MED ORDER — NITROFURANTOIN MONOHYD MACRO 100 MG PO CAPS: 100.0000 mg | ORAL_CAPSULE | Freq: Two times a day (BID) | ORAL | Status: AC

## 2012-06-22 NOTE — Assessment & Plan Note (Addendum)
Cath UA c/w UTI.  No evidence of pyelo.  Will treat with nitrofurantoin as pt allergic to cephalexin.  Pt encouraged to drink more water than other beverages.  Reviewed red flags.  Follow up with PCP.

## 2012-06-22 NOTE — Patient Instructions (Addendum)
It does look like you have a urine infection.  I have called an antibiotic in to your pharmacy.  You should take it 2 times a day for 5 days. You could also try to drink more water and less of other drinks.  Please call us if you develop fever or back pain, or if you have any other concerns.

## 2012-06-22 NOTE — Progress Notes (Signed)
  Subjective:    Patient ID: Allison Moore, female    DOB: 07-15-1981, 31 y.o.   MRN: [id number]  HPI 31 yo female here for possible UTI.  Reports 2 months of burning with urination every time she urinates, + incontinence in the morning.  Denies hematuria, fever, chills, back pain.  She does have brownish vaginal discharge, which is stable.  Denies itching.  She reports she drinks 1 glass of water a day, and about 4 glasses of other beverages (Crystal Light).    Review of Systems  See HPI Denies smoking, but is carrying a lighter.  Drinks 1 dacquri on Friday and 1 on Saturdays.     Objective:   Physical Exam  Nursing note and vitals reviewed. Constitutional: She appears well-developed and well-nourished. No distress.       Obese  Abdominal: Soft. She exhibits no distension and no mass. There is no tenderness. There is no rebound and no guarding.  Musculoskeletal:       Back: no CVAT  Skin: She is not diaphoretic.  Psychiatric: Her behavior is normal. Judgment and thought content normal.       Nl grooming and dress.  Flat affect.  Pleasant and cooperative.            Assessment & Plan:

## 2012-06-22 NOTE — Addendum Note (Signed)
Addended by: [place], Kaydyn Sayas on: 06/22/2012 05:05 PM   Modules accepted: Orders

## 2012-06-22 NOTE — Assessment & Plan Note (Deleted)
Cath UA reveals  Symptoms present for 2 months per patient, and noted to have frequent visits for similar concerns.  Concerning for dysuria of other causes.  Pt encourage to drink more water than other beverages.  Reviewed red flags.  Follow up with PCP.

## 2012-06-25 LAB — URINE CULTURE: Colony Count: >=100000

## 2012-07-06 ENCOUNTER — Other Ambulatory Visit: Payer: Self-pay | Admitting: Family Medicine

## 2012-07-17 ENCOUNTER — Other Ambulatory Visit: Payer: Self-pay | Admitting: Family Medicine

## 2012-07-17 NOTE — Telephone Encounter (Signed)
Signed PA form for [employer] 2 for glass syringe for copaxone. The Form states that the device is free of charge.

## 2012-08-18 ENCOUNTER — Other Ambulatory Visit: Payer: Self-pay | Admitting: Family Medicine

## 2012-08-18 ENCOUNTER — Telehealth: Payer: Self-pay | Admitting: Family Medicine

## 2012-08-18 NOTE — Telephone Encounter (Signed)
Patient calling back to give fax number letter can be faxed to:   Please put to attn: Malachi Paradise, fax# [phone].

## 2012-08-18 NOTE — Telephone Encounter (Signed)
Letter written. Placed in to be faxed box.

## 2012-08-18 NOTE — Telephone Encounter (Signed)
Patient is calling requesting a letter for school stating that she has Muscular Sclerosis.  Please call the patient when it is ready to be picked up.

## 2012-08-22 NOTE — Telephone Encounter (Signed)
Called patient. She will need an appt before i can fill robaxin or Voltaren.

## 2012-08-28 ENCOUNTER — Ambulatory Visit: Payer: Medicare Other | Admitting: Family Medicine

## 2012-08-31 ENCOUNTER — Encounter: Payer: Self-pay | Admitting: Family Medicine

## 2012-08-31 ENCOUNTER — Ambulatory Visit (INDEPENDENT_AMBULATORY_CARE_PROVIDER_SITE_OTHER): Payer: Medicare Other | Admitting: Family Medicine

## 2012-08-31 VITALS — BP 110/70 | HR 84 | Temp 98.3°F | Ht 64.0 in | Wt 239.0 lb

## 2012-08-31 DIAGNOSIS — G35 Multiple sclerosis: Secondary | ICD-10-CM

## 2012-08-31 DIAGNOSIS — T148XXA Other injury of unspecified body region, initial encounter: Secondary | ICD-10-CM

## 2012-08-31 DIAGNOSIS — Z23 Encounter for immunization: Secondary | ICD-10-CM

## 2012-08-31 DIAGNOSIS — T1490XA Injury, unspecified, initial encounter: Secondary | ICD-10-CM

## 2012-08-31 DIAGNOSIS — F17200 Nicotine dependence, unspecified, uncomplicated: Secondary | ICD-10-CM

## 2012-08-31 DIAGNOSIS — T443X1A Poisoning by other parasympatholytics [anticholinergics and antimuscarinics] and spasmolytics, accidental (unintentional), initial encounter: Secondary | ICD-10-CM

## 2012-08-31 LAB — COMPREHENSIVE METABOLIC PANEL
ALT: 13 U/L (ref 0–35)
AST: 15 U/L (ref 0–37)
Alkaline Phosphatase: 62 U/L (ref 39–117)
BUN: 16 mg/dL (ref 6–23)
Chloride: 103 mEq/L (ref 96–112)
Creat: 0.73 mg/dL (ref 0.50–1.10)
Total Bilirubin: 0.4 mg/dL (ref 0.3–1.2)

## 2012-08-31 MED ORDER — METHOCARBAMOL 500 MG PO TABS: 500.0000 mg | ORAL_TABLET | Freq: Every evening | ORAL | Status: DC | PRN

## 2012-08-31 NOTE — Patient Instructions (Addendum)
Danahi,  Thank you for coming in today.   You will be contacted by PT to schedule therapy.   Continue exercises with goal of most days per week. Take muscle relaxer as needed only, and at night, not daily.  Take anti-inflammatory: start with tylenol or tart cherry juice.   Dr. Armen Pickup  Smoking cessation support: smoking cessation hotline: 1-800-QUIT-NOW.  Here is the number to the smoking cessation classes at [facility]: 161-0960  F/U in 8 weeks.

## 2012-08-31 NOTE — Assessment & Plan Note (Signed)
A: resolved. No longer taking glycopyrrolate.
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
## 2012-09-04 ENCOUNTER — Encounter: Payer: Self-pay | Admitting: *Deleted

## 2012-09-06 NOTE — Assessment & Plan Note (Signed)
A: persistent R knee pains.  P: PT.  Continue exercises with goal of most days per week. Take muscle relaxer as needed only, and at night, not daily.  Take anti-inflammatory: start with tylenol or tart cherry juice

## 2012-09-06 NOTE — Assessment & Plan Note (Signed)
A: stable. Patient compliant with capoxone.  P:  Formed for school filled out with requested accommodations.

## 2012-09-06 NOTE — Assessment & Plan Note (Signed)
A: not ready to quit. P: counseled on smoking cessation.

## 2012-09-11 ENCOUNTER — Ambulatory Visit: Payer: Medicare Other | Attending: Family Medicine | Admitting: Physical Therapy

## 2012-10-02 ENCOUNTER — Ambulatory Visit: Payer: Medicare Other | Admitting: Family Medicine

## 2012-10-11 ENCOUNTER — Ambulatory Visit (INDEPENDENT_AMBULATORY_CARE_PROVIDER_SITE_OTHER): Payer: Medicare Other | Admitting: Family Medicine

## 2012-10-11 ENCOUNTER — Encounter: Payer: Self-pay | Admitting: Family Medicine

## 2012-10-11 VITALS — Temp 98.7°F | Ht 64.0 in | Wt 235.0 lb

## 2012-10-11 DIAGNOSIS — T8339XA Other mechanical complication of intrauterine contraceptive device, initial encounter: Secondary | ICD-10-CM

## 2012-10-11 MED ORDER — PRENATAL VITAMINS PLUS 27-1 MG PO TABS: 1.0000 | ORAL_TABLET | Freq: Every day | ORAL | Status: DC

## 2012-10-11 NOTE — Assessment & Plan Note (Addendum)
Urine pregnancy test done today and negative.  Please go for pelvic and transvaginal US to confirm that your IUD is still in place.  Referral to Gyn placed for removal of IUD if it is confirmed present by ultrasound

## 2012-10-11 NOTE — Patient Instructions (Addendum)
Allison Moore,  Thank you for coming in today.   Urine pregnancy test done today. Please go for pelvic and transvaginal US to confirm that your IUD is still in place.  Referral to Gyn placed for removal of IUD if it is confirmed present by ultrasound.   For pre-pregnancy health: 1. Start taking prenatal vitamin 2. Work hard on smoking cessation. Smoking cessation support: smoking cessation hotline: 1-800-QUIT-NOW.  Here is the number to the smoking cessation classes at [facility]: 161-0960 3. Speak with your psychiatrist and counselor about your plans.   Dr. Armen Pickup

## 2012-10-11 NOTE — Progress Notes (Signed)
Subjective:     Patient ID: Allison Moore, female   DOB: 10-06-1981, 31 y.o.   MRN: [id number]  HPI 31 yo F present for f/u to discuss the following:  1. Desired IUD removal: IUD placed in 2007. She desires removal. She does not want a new IUD or different birth control. She would like to have another child. She is sexually active with one partner. IUD strings were not visualized on last pelvic exam in 2012. Pelvic and TVUS revealed IUD in place in 2012. She is smoking 1/2 PPD.   Review of Systems As per HPI    Objective:   Physical Exam Temp 98.7 F (37.1 C) (Oral)  Ht 5\' 4"  (1.626 m)  Wt 235 lb (106.595 kg)  BMI 40.34 kg/m2 General appearance: alert, cooperative and no distress Pelvic: external genitalia normal. narrow pelvic outlet with pain on insertion of speculum. Scant white-blood tinged vaginal discharge. Unable to visualize cervix or IUD strings.      Assessment and Plan:

## 2012-10-16 ENCOUNTER — Ambulatory Visit (HOSPITAL_COMMUNITY)
Admission: RE | Admit: 2012-10-16 | Discharge: 2012-10-16 | Disposition: A | Discharge status: Home / Self Care | Payer: Medicare Other | Source: Ambulatory Visit | Attending: Family Medicine | Admitting: Family Medicine

## 2012-10-16 DIAGNOSIS — T8339XA Other mechanical complication of intrauterine contraceptive device, initial encounter: Secondary | ICD-10-CM | POA: Insufficient documentation

## 2012-10-23 ENCOUNTER — Telehealth: Payer: Self-pay | Admitting: Family Medicine

## 2012-10-23 NOTE — Telephone Encounter (Signed)
Pt phone "not in service" LMO mothers phone to have pt rt our call. Please advise of Dr Armen Pickup notes.

## 2012-10-23 NOTE — Telephone Encounter (Signed)
Message copied by Barnie Alderman on Mon Oct 23, 2012  9:46 AM ------      Message from: Dessa Phi      Created: Mon Oct 23, 2012  8:50 AM       Please inform patient.       Reviewed pelvic ultrasound      IUD in place      Patient to schedule IUD removal with gyn.      Referral placed at last office visit.

## 2012-11-02 ENCOUNTER — Encounter: Payer: Medicare Other | Admitting: Obstetrics & Gynecology

## 2012-11-22 ENCOUNTER — Encounter: Payer: Medicare Other | Admitting: Obstetrics & Gynecology

## 2012-12-07 ENCOUNTER — Encounter: Payer: Medicare Other | Admitting: Obstetrics & Gynecology

## 2012-12-25 ENCOUNTER — Encounter: Payer: Medicare Other | Admitting: Obstetrics & Gynecology

## 2013-01-17 ENCOUNTER — Ambulatory Visit: Payer: Medicare Other | Admitting: Obstetrics & Gynecology

## 2013-01-17 ENCOUNTER — Encounter: Payer: Self-pay | Admitting: Obstetrics & Gynecology

## 2013-01-17 VITALS — BP 130/58 | HR 76 | Temp 97.8°F | Resp 20 | Ht 64.0 in | Wt 240.3 lb

## 2013-01-17 DIAGNOSIS — T8332XD Displacement of intrauterine contraceptive device, subsequent encounter: Secondary | ICD-10-CM

## 2013-01-17 DIAGNOSIS — Z30432 Encounter for removal of intrauterine contraceptive device: Secondary | ICD-10-CM

## 2013-01-17 NOTE — Patient Instructions (Signed)

## 2013-01-17 NOTE — Progress Notes (Signed)
Patient ID: Allison Moore, female   DOB: 04/22/1981, 32 y.o.   MRN: [id number] Pt was referred here for Mirena IUD removal. An attempt was made to remove it in her primary care physicians ofc with no success.  There are no other GYN concerns.  Last pap smear was on 2011 and was normal.  IUD Removal  Patient was in the dorsal lithotomy position, normal external genitalia was noted.  A speculum was placed in the patient's vagina, normal discharge was noted, no lesions. Thenulliparous cervix was visualized, no lesions, no abnormal discharge;  and the cervix was swabbed with Betadine using scopettes. The strings were not noted.  An attempt was made to locate the strings with a Kelly clamp with no success.  An IUD extractor was used to remove the IUD.  The IUD was successfully removed in its entirety.  Patient tolerated the procedure well.    She was instructed to f/u with her primary care physician for further care, including a PAP.   Pt declined further contraceptive care.        Allison Moore L. Harraway-Smith, M.D., [facility]n Core

## 2013-01-29 ENCOUNTER — Encounter: Payer: Self-pay | Admitting: Family Medicine

## 2013-01-29 ENCOUNTER — Ambulatory Visit (INDEPENDENT_AMBULATORY_CARE_PROVIDER_SITE_OTHER): Payer: Medicare Other | Admitting: Family Medicine

## 2013-01-29 VITALS — BP 125/84 | HR 81 | Ht 64.0 in | Wt 240.0 lb

## 2013-01-29 DIAGNOSIS — R1013 Epigastric pain: Secondary | ICD-10-CM | POA: Insufficient documentation

## 2013-01-29 DIAGNOSIS — N912 Amenorrhea, unspecified: Secondary | ICD-10-CM

## 2013-01-29 LAB — COMPREHENSIVE METABOLIC PANEL
ALT: 14 U/L (ref 0–35)
Albumin: 4.2 g/dL (ref 3.5–5.2)
CO2: 26 mEq/L (ref 19–32)
Chloride: 104 mEq/L (ref 96–112)
Glucose, Bld: 87 mg/dL (ref 70–99)
Potassium: 4.2 mEq/L (ref 3.5–5.3)
Sodium: 136 mEq/L (ref 135–145)
Total Bilirubin: 0.3 mg/dL (ref 0.3–1.2)
Total Protein: 7 g/dL (ref 6.0–8.3)

## 2013-01-29 LAB — LIPASE: Lipase: 26 U/L (ref 0–75)

## 2013-01-29 MED ORDER — OMEPRAZOLE 40 MG PO CPDR: 40.0000 mg | DELAYED_RELEASE_CAPSULE | Freq: Every day | ORAL | Status: DC

## 2013-01-29 MED ORDER — PRENATAL VITAMINS PLUS 27-1 MG PO TABS: 1.0000 | ORAL_TABLET | Freq: Every day | ORAL | Status: DC

## 2013-01-29 NOTE — Assessment & Plan Note (Signed)
A: suspect gastritis vs pancreatitis given history and exam findings. P: PPI Labs: lipase, CMP  Counseled patient regarding smoking and alcohol cessation.

## 2013-01-29 NOTE — Patient Instructions (Addendum)
Allison Moore,  Thank you for coming in today.  Your urine pregancy test is: negative.  In anticipation of future pregnancy please do the following: Stop smoking today No more alcohol, there is no known safe amount in pregnancy. Start prenatal vitamin today.  For your abdominal pain and nausea: Lab work Start PPI for short course to see if this helps. Avoid acidic foods like tomato and citrus.  Avoid eating large meals.   Plan to see me in 4 weeks.   Dr. Armen Pickup

## 2013-01-29 NOTE — Progress Notes (Signed)
Subjective:     Patient ID: Allison Moore, female   DOB: June 07, 1981, 33 y.o.   MRN: [id number]  HPI 32 yo F presents for f/u visit to discuss the following:  1. Abdominal pain: x 7-10 days. Sharp moderate epigastric pain. Non radiating. Associated with nausea, no vomiting and diarrhea x one week. No fever or  known sick contacts. No dysuria or  vaginal bleeding or discharge. LMP 3 weeks ago. Recent IUD removal 01/17/13 no pain or nausea at this time.  Patient admits to drinking two beers daily x last month and smoking I/2 PPD x 6 years.  2. Desired pregnancy: IUD removed. Patient sexually active and not using protection. Taking copaxone last injection today. Smoking and drinking alcohol as per above. No PNV. Hx of MS, bipolar disorder and schizoaffective disorder.   Review of Systems As per HPI     Objective:   Physical Exam BP 125/84  Pulse 81  Ht 5\' 4"  (1.626 m)  Wt 240 lb (108.863 kg)  BMI 41.18 kg/m2  LMP 01/08/2013 General appearance: alert, cooperative and no distress Eyes: scleral icterus Throat: lips, mucosa, and tongue normal; teeth and gums normal Back: symmetric, no curvature. ROM normal. No CVA tenderness. Lungs: clear to auscultation bilaterally Heart: regular rate and rhythm, S1, S2 normal, no murmur, click, rub or gallop Abdomen: soft, mild TTP epigastric and RUQ w/o rebound or guarding. Bowel sounds normal; no masses,  no organomegaly     Assessment and Plan:

## 2013-01-30 ENCOUNTER — Encounter: Payer: Self-pay | Admitting: *Deleted

## 2013-02-15 ENCOUNTER — Telehealth: Payer: Self-pay | Admitting: Family Medicine

## 2013-02-15 NOTE — Telephone Encounter (Signed)
Patient needs to schedule a pregnancy test.

## 2013-02-15 NOTE — Telephone Encounter (Signed)
Appointment scheduled with Dr. Armen Pickup 02/16/2013 @ 9:30 to confirm pregnancy.  [place] notified of appointment.  Ileana Ladd

## 2013-02-16 ENCOUNTER — Ambulatory Visit (INDEPENDENT_AMBULATORY_CARE_PROVIDER_SITE_OTHER): Payer: Medicare Other | Admitting: Family Medicine

## 2013-02-16 VITALS — BP 113/79 | HR 78 | Ht 64.0 in | Wt 237.0 lb

## 2013-02-16 DIAGNOSIS — N912 Amenorrhea, unspecified: Secondary | ICD-10-CM

## 2013-02-16 DIAGNOSIS — R1013 Epigastric pain: Secondary | ICD-10-CM

## 2013-02-16 MED ORDER — VITAMIN B-6 100 MG PO TABS: 100.0000 mg | ORAL_TABLET | Freq: Every day | ORAL | Status: DC

## 2013-02-16 NOTE — Assessment & Plan Note (Signed)
A: improved. Still having nausea. Stopped alcohol. u preg negative.  P: Continue PPI Start vitamin B6 and prenatal vitamin Stop smoking F/u prn.

## 2013-02-16 NOTE — Patient Instructions (Addendum)
Allison Moore,  Thank you for coming in today. Your urine pregnancy test is negative.  For abdominal pain: Continue prilosec Start vitamin b6 for nausea.   For pregnancy planning: Cut down on cigarettes to none Start prenatal vitamin.  F/u as needed.  Dr. Armen Pickup

## 2013-02-16 NOTE — Progress Notes (Signed)
Subjective:     Patient ID: Allison Moore, female   DOB: [birth year], 32 y.o.   MRN: [id number]  HPI 32 yo F presents for office visit for possibly pregnancy. No period with IUD. IUD removed 01/17/13. Stil no period. Having nausea. Small volume emesis. No fever. No significant pain. Smoking 8 cigs per days. Stopped drinking alcohol. No prenatal vitamin. Trying to conceive.   Review of Systems As per HPI     Objective:   Physical Exam BP 113/79  Pulse 78  Ht 5\' 4"  (1.626 m)  Wt 237 lb (107.502 kg)  BMI 40.66 kg/m2  LMP 01/08/2013 General appearance: alert, cooperative and no distress Abdomen: obese, soft, non-tender; bowel sounds normal; no masses,  no organomegaly    Assessment and Plan:

## 2013-03-18 ENCOUNTER — Emergency Department (HOSPITAL_COMMUNITY)
Admission: EM | Admit: 2013-03-18 | Discharge: 2013-03-18 | Disposition: A | Discharge status: Home / Self Care | Payer: Medicare Other | Attending: Emergency Medicine | Admitting: Emergency Medicine

## 2013-03-18 ENCOUNTER — Encounter (HOSPITAL_COMMUNITY): Payer: Self-pay | Admitting: *Deleted

## 2013-03-18 DIAGNOSIS — G35 Multiple sclerosis: Secondary | ICD-10-CM | POA: Insufficient documentation

## 2013-03-18 DIAGNOSIS — IMO0001 Reserved for inherently not codable concepts without codable children: Secondary | ICD-10-CM | POA: Insufficient documentation

## 2013-03-18 DIAGNOSIS — F17200 Nicotine dependence, unspecified, uncomplicated: Secondary | ICD-10-CM | POA: Insufficient documentation

## 2013-03-18 DIAGNOSIS — Z8742 Personal history of other diseases of the female genital tract: Secondary | ICD-10-CM | POA: Insufficient documentation

## 2013-03-18 DIAGNOSIS — M79609 Pain in unspecified limb: Secondary | ICD-10-CM | POA: Insufficient documentation

## 2013-03-18 DIAGNOSIS — M79604 Pain in right leg: Secondary | ICD-10-CM

## 2013-03-18 DIAGNOSIS — Z3202 Encounter for pregnancy test, result negative: Secondary | ICD-10-CM | POA: Insufficient documentation

## 2013-03-18 DIAGNOSIS — Z8739 Personal history of other diseases of the musculoskeletal system and connective tissue: Secondary | ICD-10-CM | POA: Insufficient documentation

## 2013-03-18 DIAGNOSIS — Z79899 Other long term (current) drug therapy: Secondary | ICD-10-CM | POA: Insufficient documentation

## 2013-03-18 DIAGNOSIS — Z8679 Personal history of other diseases of the circulatory system: Secondary | ICD-10-CM | POA: Insufficient documentation

## 2013-03-18 DIAGNOSIS — R110 Nausea: Secondary | ICD-10-CM

## 2013-03-18 DIAGNOSIS — F489 Nonpsychotic mental disorder, unspecified: Secondary | ICD-10-CM | POA: Insufficient documentation

## 2013-03-18 DIAGNOSIS — Z96659 Presence of unspecified artificial knee joint: Secondary | ICD-10-CM | POA: Insufficient documentation

## 2013-03-18 LAB — URINALYSIS, ROUTINE W REFLEX MICROSCOPIC
Bilirubin Urine: NEGATIVE
Glucose, UA: NEGATIVE mg/dL
Ketones, ur: NEGATIVE mg/dL
Protein, ur: NEGATIVE mg/dL
pH: 5.5 (ref 5.0–8.0)

## 2013-03-18 MED ORDER — ONDANSETRON 4 MG PO TBDP: 4.0000 mg | ORAL_TABLET | Freq: Three times a day (TID) | ORAL | Status: DC | PRN

## 2013-03-18 NOTE — Progress Notes (Signed)
VASCULAR LAB PRELIMINARY  PRELIMINARY  PRELIMINARY  PRELIMINARY  Right lower extremity venous Doppler completed.    Preliminary report:  There is no DVT or SVT noted in the right lower extremity.  Omri Bertran, RVT 03/18/2013, 5:10 PM

## 2013-03-18 NOTE — ED Notes (Signed)
Pt states she needs a pregnancy test because she doesn't have the money for one and she has right calf pain for a week. Pt ambulatory with no distress. Quick, steady gait.Pt states she is nauseous and that is why she thinks she is pregnant. No abd pain.

## 2013-03-18 NOTE — ED Notes (Signed)
Pt is here with nausea for a couple of days and wants to check to see if she is pregnant,  No abdominal pain.  LMP-  Last month April.  Pt is complaining of right calf pain and reports she has had right knee replacement 2 years ago.  Rt DPP present
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
## 2013-03-18 NOTE — Discharge Instructions (Signed)
Take the prescribed medication as directed.  May take over the counter pain medications as needed for your leg pain. Follow-up with your primary care physician if symptoms worsen. Return to the ED for new concerns.

## 2013-03-22 NOTE — ED Provider Notes (Signed)
Medical screening examination/treatment/procedure(s) were performed by non-physician practitioner and as supervising physician I was immediately available for consultation/collaboration.   Nelia Shi, MD 03/22/13 [phone]

## 2013-04-20 ENCOUNTER — Telehealth: Payer: Self-pay | Admitting: Nurse Practitioner

## 2013-04-20 ENCOUNTER — Other Ambulatory Visit: Payer: Self-pay | Admitting: Family Medicine

## 2013-04-20 NOTE — Telephone Encounter (Signed)
Her copaxone does not come from her local pharmacy. She should [facility] or the pharmacy on the box of her meds.

## 2013-04-20 NOTE — Telephone Encounter (Signed)
I attempted to contact patient to let her know that a message has been sent to Ms. Daphine Deutscher but, her phone is disconnected.  I tried calling the neighbors phone. It went to VM. I did not leave a message, due to confidentiality.

## 2013-04-20 NOTE — Telephone Encounter (Signed)
Message copied by [facility] on Fri Apr 20, 2013  2:15 PM ------      Message from: Levander Campion E      Created: Fri Apr 20, 2013  1:30 PM      Contact: patient       Patient calling wanting call back about her getting her Capaxone. She says she called earlier-please call [phone] ------

## 2013-04-20 NOTE — Telephone Encounter (Signed)
I returned patients call. The phone is not working or is out of the service area. SHould she call back, please notify her to call [phone] to get her Copaxone.

## 2013-04-20 NOTE — Telephone Encounter (Signed)
Attempted to call patient about copaxone refill. At a previous visit she informed me that she was no longer taking this medication and had not followed up with neurology, so it was discontinued.  Unable to reach patient. Refilled denied pending office visit.

## 2013-04-23 ENCOUNTER — Emergency Department (HOSPITAL_COMMUNITY)
Admission: EM | Admit: 2013-04-23 | Discharge: 2013-04-24 | Disposition: A | Discharge status: Home / Self Care | Payer: Medicare Other | Source: Home / Self Care | Attending: Emergency Medicine | Admitting: Emergency Medicine

## 2013-04-23 ENCOUNTER — Encounter (HOSPITAL_COMMUNITY): Payer: Self-pay | Admitting: Emergency Medicine

## 2013-04-23 DIAGNOSIS — F259 Schizoaffective disorder, unspecified: Secondary | ICD-10-CM

## 2013-04-23 DIAGNOSIS — F1210 Cannabis abuse, uncomplicated: Secondary | ICD-10-CM | POA: Diagnosis present

## 2013-04-23 DIAGNOSIS — F411 Generalized anxiety disorder: Secondary | ICD-10-CM | POA: Diagnosis present

## 2013-04-23 DIAGNOSIS — R443 Hallucinations, unspecified: Secondary | ICD-10-CM

## 2013-04-23 DIAGNOSIS — F17200 Nicotine dependence, unspecified, uncomplicated: Secondary | ICD-10-CM | POA: Diagnosis present

## 2013-04-23 DIAGNOSIS — E669 Obesity, unspecified: Secondary | ICD-10-CM | POA: Diagnosis present

## 2013-04-23 DIAGNOSIS — R45851 Suicidal ideations: Secondary | ICD-10-CM

## 2013-04-23 DIAGNOSIS — R45850 Homicidal ideations: Secondary | ICD-10-CM

## 2013-04-23 DIAGNOSIS — G35 Multiple sclerosis: Secondary | ICD-10-CM | POA: Diagnosis present

## 2013-04-23 DIAGNOSIS — Z79899 Other long term (current) drug therapy: Secondary | ICD-10-CM

## 2013-04-23 HISTORY — DX: Schizoaffective disorder, unspecified: F25.9

## 2013-04-23 LAB — COMPREHENSIVE METABOLIC PANEL WITH GFR
ALT: 20 U/L (ref 0–35)
AST: 22 U/L (ref 0–37)
Albumin: 3.8 g/dL (ref 3.5–5.2)
Alkaline Phosphatase: 51 U/L (ref 39–117)
BUN: 13 mg/dL (ref 6–23)
CO2: 30 meq/L (ref 19–32)
Calcium: 9.2 mg/dL (ref 8.4–10.5)
Chloride: 107 meq/L (ref 96–112)
Creatinine, Ser: 0.71 mg/dL (ref 0.50–1.10)
GFR calc Af Amer: >90 mL/min
GFR calc non Af Amer: >90 mL/min
Glucose, Bld: 98 mg/dL (ref 70–99)
Potassium: 3.5 meq/L (ref 3.5–5.1)
Sodium: 142 meq/L (ref 135–145)
Total Bilirubin: 0.4 mg/dL (ref 0.3–1.2)
Total Protein: 7 g/dL (ref 6.0–8.3)

## 2013-04-23 LAB — CBC
HCT: 40.9 % (ref 36.0–46.0)
Hemoglobin: 13.6 g/dL (ref 12.0–15.0)
RBC: 4.64 MIL/uL (ref 3.87–5.11)
WBC: 6 10*3/uL (ref 4.0–10.5)

## 2013-04-23 LAB — RAPID URINE DRUG SCREEN, HOSP PERFORMED
Amphetamines: NOT DETECTED
Barbiturates: NOT DETECTED
Benzodiazepines: NOT DETECTED
Cocaine: NOT DETECTED
Opiates: NOT DETECTED
Tetrahydrocannabinol: POSITIVE — AB

## 2013-04-23 LAB — POCT PREGNANCY, URINE: Preg Test, Ur: NEGATIVE

## 2013-04-23 LAB — ETHANOL: Alcohol, Ethyl (B): <11 mg/dL (ref 0–11)

## 2013-04-23 LAB — ACETAMINOPHEN LEVEL: Acetaminophen (Tylenol), Serum: <15 ug/mL (ref 10–30)

## 2013-04-23 LAB — SALICYLATE LEVEL: Salicylate Lvl: <2 mg/dL — ABNORMAL LOW (ref 2.8–20.0)

## 2013-04-23 MED ORDER — NICOTINE 21 MG/24HR TD PT24: 21.0000 mg | MEDICATED_PATCH | Freq: Every day | TRANSDERMAL | Status: DC

## 2013-04-23 MED ORDER — ONDANSETRON 4 MG PO TBDP: 4.0000 mg | ORAL_TABLET | Freq: Three times a day (TID) | ORAL | Status: DC | PRN

## 2013-04-23 MED ORDER — ZIPRASIDONE MESYLATE 20 MG IM SOLR: 10.0000 mg | Freq: Two times a day (BID) | INTRAMUSCULAR | Status: DC | PRN

## 2013-04-23 MED ORDER — IBUPROFEN 400 MG PO TABS: 400.0000 mg | ORAL_TABLET | Freq: Four times a day (QID) | ORAL | Status: DC | PRN

## 2013-04-23 MED ORDER — ZOLPIDEM TARTRATE 5 MG PO TABS
5.0000 mg | ORAL_TABLET | Freq: Every evening | ORAL | Status: DC | PRN
  Administered 2013-04-24: 5 mg via ORAL
  Filled 2013-04-23: qty 1

## 2013-04-23 MED ORDER — NICOTINE 14 MG/24HR TD PT24
14.0000 mg | MEDICATED_PATCH | Freq: Every day | TRANSDERMAL | Status: DC
  Administered 2013-04-23 – 2013-04-24 (×2): 14 mg via TRANSDERMAL
  Filled 2013-04-23 (×3): qty 1

## 2013-04-23 MED ORDER — ALUM & MAG HYDROXIDE-SIMETH 200-200-20 MG/5ML PO SUSP: 30.0000 mL | ORAL | Status: DC | PRN

## 2013-04-23 MED ORDER — LORAZEPAM 1 MG PO TABS
1.0000 mg | ORAL_TABLET | Freq: Three times a day (TID) | ORAL | Status: DC | PRN
  Administered 2013-04-24: 1 mg via ORAL
  Filled 2013-04-23: qty 1

## 2013-04-23 NOTE — ED Notes (Signed)
Security at bedside to wand patient.   Belongings were wanded and placed at desk until mother leaves.  When mom leaves, she will be taking belongings with her.
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
## 2013-04-23 NOTE — ED Notes (Signed)
Pt here c/o auditory and visual hallucinations x 5 days; pt off meds x 4 months; pt here with mother; pt denies Si/HI

## 2013-04-23 NOTE — ED Notes (Signed)
Patient expressed homicidal ideation to PA.  Calling house coverage to get a sitter.
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
## 2013-04-23 NOTE — ED Notes (Addendum)
Spoke with tele psych psychiatrist on phone- states tele psych is not working nation wide.  Attempted to have consult completed over the phone- pt became upset and states it made her feel uncomfortable.  Spoke with psychiatrist who states she will keep consult open until machine is fixed, states she cannot make any recommendations based on talking to the patient on the phone.  RN and sitter talked to pt after phone call- pt calmed and given dinner tray.

## 2013-04-23 NOTE — ED Notes (Signed)
Called charge, and security to advise of medical clearance patient.   Patient has been placed in paper scrubs.  Patient belongings were given to mother of patient by patient.   Waiting on security to come and wand patient.

## 2013-04-23 NOTE — ED Notes (Signed)
telepsych monitor in room  

## 2013-04-23 NOTE — ED Notes (Signed)
Spoke to dr. Leretha Pol given medical history. Pt in restroom providing urine sample.

## 2013-04-23 NOTE — ED Notes (Signed)
Patient asked if she could provide a urine sample and she advised could not at this time.   Patient is aware that we need a sample.

## 2013-04-24 ENCOUNTER — Inpatient Hospital Stay (HOSPITAL_COMMUNITY)
Admission: EM | Admit: 2013-04-24 | Discharge: 2013-04-29 | DRG: 885 | Disposition: A | Discharge status: Home / Self Care | Payer: Medicare Other | Source: Intra-hospital | Attending: Psychiatry | Admitting: Psychiatry

## 2013-04-24 ENCOUNTER — Encounter (HOSPITAL_COMMUNITY): Payer: Self-pay | Admitting: *Deleted

## 2013-04-24 DIAGNOSIS — F411 Generalized anxiety disorder: Secondary | ICD-10-CM

## 2013-04-24 DIAGNOSIS — F319 Bipolar disorder, unspecified: Secondary | ICD-10-CM

## 2013-04-24 DIAGNOSIS — T148XXA Other injury of unspecified body region, initial encounter: Secondary | ICD-10-CM

## 2013-04-24 DIAGNOSIS — R1013 Epigastric pain: Secondary | ICD-10-CM

## 2013-04-24 DIAGNOSIS — E669 Obesity, unspecified: Secondary | ICD-10-CM

## 2013-04-24 DIAGNOSIS — F251 Schizoaffective disorder, depressive type: Secondary | ICD-10-CM | POA: Diagnosis present

## 2013-04-24 DIAGNOSIS — F1210 Cannabis abuse, uncomplicated: Secondary | ICD-10-CM

## 2013-04-24 DIAGNOSIS — N63 Unspecified lump in unspecified breast: Secondary | ICD-10-CM

## 2013-04-24 DIAGNOSIS — F17200 Nicotine dependence, unspecified, uncomplicated: Secondary | ICD-10-CM

## 2013-04-24 DIAGNOSIS — E785 Hyperlipidemia, unspecified: Secondary | ICD-10-CM

## 2013-04-24 DIAGNOSIS — R45851 Suicidal ideations: Secondary | ICD-10-CM

## 2013-04-24 DIAGNOSIS — F259 Schizoaffective disorder, unspecified: Principal | ICD-10-CM

## 2013-04-24 DIAGNOSIS — G35 Multiple sclerosis: Secondary | ICD-10-CM

## 2013-04-24 HISTORY — DX: Depression, unspecified: F32.A

## 2013-04-24 HISTORY — DX: Major depressive disorder, single episode, unspecified: F32.9

## 2013-04-24 MED ORDER — MAGNESIUM HYDROXIDE 400 MG/5ML PO SUSP
30.0000 mL | Freq: Every day | ORAL | Status: DC | PRN
  Administered 2013-05-01: 30 mL via ORAL

## 2013-04-24 MED ORDER — HYDROXYZINE HCL 25 MG PO TABS
25.0000 mg | ORAL_TABLET | Freq: Four times a day (QID) | ORAL | Status: DC | PRN
  Administered 2013-04-24 – 2013-04-26 (×3): 25 mg via ORAL

## 2013-04-24 MED ORDER — ZIPRASIDONE MESYLATE 20 MG IM SOLR: 20.0000 mg | Freq: Two times a day (BID) | INTRAMUSCULAR | Status: DC | PRN

## 2013-04-24 MED ORDER — LORAZEPAM 1 MG PO TABS: 2.0000 mg | ORAL_TABLET | Freq: Four times a day (QID) | ORAL | Status: DC | PRN

## 2013-04-24 MED ORDER — ALUM & MAG HYDROXIDE-SIMETH 200-200-20 MG/5ML PO SUSP
30.0000 mL | ORAL | Status: DC | PRN
  Administered 2013-04-30: 30 mL via ORAL

## 2013-04-24 MED ORDER — IBUPROFEN 600 MG PO TABS
600.0000 mg | ORAL_TABLET | Freq: Four times a day (QID) | ORAL | Status: DC | PRN
Start: 2013-04-24 — End: 2013-05-01
  Administered 2013-04-24 – 2013-04-29 (×8): 600 mg via ORAL
  Filled 2013-04-24 (×9): qty 1
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
## 2013-04-24 NOTE — Progress Notes (Signed)
Adult Psychoeducational Group Note  Date:  04/24/2013 Time:  9:44 PM  Group Topic/Focus:  Wrap-Up Group:   The focus of this group is to help patients review their daily goal of treatment and discuss progress on daily workbooks.  Participation Level:  Minimal  Participation Quality:  minimal  Affect:  Flat  Cognitive:  Lacking  Insight: Lacking  Engagement in Group:  Poor  Modes of Intervention:  Support  Additional Comments:  Patient attended and participated in group tonight. She reports that she just arrived today to [facility]. She was at [facility] today.   Lita Mains [facility] 04/24/2013, 9:44 PM

## 2013-04-24 NOTE — BH Assessment (Signed)
Assessment Note  Update:  Received call from [facility] stating pt accepted by Donell Sievert, PA to Dr. Jannifer Franklin to bed 401-1 and [facility]  Completed support paperwork, faxed to [employer] [employer], and updated assessment disposition.  Updated EDP Belfi and ED staff.  Pt to be transported to [facility] via security, as pt is voluntary.    Disposition:  Disposition Initial Assessment Completed for this Encounter: Yes Disposition of Patient: Inpatient treatment program Type of inpatient treatment program: Adult Patient referred to: Other (Comment) (Pt accepted [facility])  On Site Evaluation by:   Reviewed with Physician:  Oris Drone 04/24/2013 9:39 AM

## 2013-04-24 NOTE — ED Provider Notes (Signed)
Medical screening examination/treatment/procedure(s) were performed by non-physician practitioner and as supervising physician I was immediately available for consultation/collaboration.  Eltha Tingley, MD 04/24/13 1535 

## 2013-04-24 NOTE — BH Assessment (Signed)
[facility] Assessment Progress Note      Client has been accepted by Donell Sievert PA to Dr. Jannifer Franklin to room 401-1.

## 2013-04-24 NOTE — ED Provider Notes (Signed)
Pt has been accepted to [facility] by Dr. Clemon Chambers, MD 04/24/13 [phone]

## 2013-04-24 NOTE — Progress Notes (Addendum)
Patient ID: Allison Moore, female   DOB: 09-15-1981, 32 y.o.   MRN: [id number] Nursing admit note:  Patient is a 32 yo female transferred from [facility] due to psychosis and depression. Patient's mother brought her to ED and stayed by her bedside.  Patient goes to a day program called "[facility]" and she informed a staff member that she was "hearing voices."  The voices are telling her to "stop using marijuana."  Patient also stated that she sees "smoke" around people.  Patient's thought processes are slow and delayed.  She also has some mild confusion apparent.  Patient has also been noncompliant with medications since February due to the voices.  Before admission to [facility], she stated in the ED that she had HI toward her boyfriend.  She denies HI at this time.  She denies any SI/AVH; however, she appears to be responding to internal stimuli.  She was observed talking to herself during admission.  Patient was difficult to assess during admission as she was not forthcoming with her history.  She did state that she smoked marijuana, however, would not disclose how much or how often.  She was (+) for THC on UDS.  Patient states she drinks alcohol, but only socially.  Patient states that she has not been brushing her teeth or taking care of her other personal hygiene.  She presented discheveled with body odor.  Patient does live with her father in an apartment and her mother is involved in her care.  Patient has had previous visits here at [facility].  She is cooperative with staff.  She remains suspicious, guarded and cautious.  She presents with flat affect and appears to be thought blocking.  She does use a walker at home due to her MS.  Patient was given a walker for ambulation.  She is a high fall risk due to weakness in her legs.  She is also a "do not admit" due to her psychosis.  Patient is followed through [facility], but has not been since January of this year. Her PCP is Risk manager at [facility].  Patient was oriented to room and unit.

## 2013-04-24 NOTE — Progress Notes (Signed)
Pt. Was getting in the shower when writer attempted the first assessment.  The pt. Had a difficult time deciding where she could put her clothes while she bathed.  Writer put her clothes in a bag and put the bag on the side of the sink so they would be ready when the pt. Got out of the shower.  This satisfied the pt.  The pt. Denies A and VH but appeared preoccupied most of the evening.  Pt. Came to the med window and writer told pt. She did not have any scheduled meds but did have a PRN med she could have for sleep.  Pt. Ask if it would help her sleep again and writer said it would, Pt. Returned to the window again asking the same question and just stood at the window as though she was Teacher, music was being truthful.  Pt. Was ambulating in her room without difficulty when writer spoke with her prior to her bath.  Pt. Also entered the dayroom during group without difficulty but later appeared to be depending on the walker a great deal.  Pt. Denied pain and is currently in bed resting quietly.

## 2013-04-24 NOTE — Tx Team (Addendum)
Initial Interdisciplinary Treatment Plan  PATIENT STRENGTHS: (choose at least two) Average or above average intelligence Supportive family/friends  PATIENT STRESSORS: Health problems Medication change or noncompliance   PROBLEM LIST: Problem List/Patient Goals Date to be addressed Date deferred Reason deferred Estimated date of resolution  Psychosis 04/24/13   discharge  Health issues 04/24/13     THC use 04/24/13     Depression 04/24/13                                    DISCHARGE CRITERIA:  Ability to meet basic life and health needs Improved stabilization in mood, thinking, and/or behavior Medical problems require only outpatient monitoring Need for constant or close observation no longer present Verbal commitment to aftercare and medication compliance  PRELIMINARY DISCHARGE PLAN: Attend aftercare/continuing care group Return to previous living arrangement  PATIENT/FAMIILY INVOLVEMENT: This treatment plan has been presented to and reviewed with the patient, Allison Moore.  The patient and family have been given the opportunity to ask questions and make suggestions.  Cranford Mon 04/24/2013, 2:14 PM

## 2013-04-25 ENCOUNTER — Encounter (HOSPITAL_COMMUNITY): Payer: Self-pay | Admitting: Psychiatry

## 2013-04-25 DIAGNOSIS — F1210 Cannabis abuse, uncomplicated: Secondary | ICD-10-CM

## 2013-04-25 DIAGNOSIS — F411 Generalized anxiety disorder: Secondary | ICD-10-CM

## 2013-04-25 MED ORDER — BENZTROPINE MESYLATE 1 MG PO TABS
1.0000 mg | ORAL_TABLET | Freq: Every day | ORAL | Status: DC
  Administered 2013-04-25 – 2013-04-30 (×6): 1 mg via ORAL
  Filled 2013-04-25 (×7): qty 1

## 2013-04-25 MED ORDER — TRAZODONE HCL 50 MG PO TABS
50.0000 mg | ORAL_TABLET | Freq: Every evening | ORAL | Status: DC | PRN
  Administered 2013-04-25 – 2013-04-29 (×5): 50 mg via ORAL
  Filled 2013-04-25 (×4): qty 1

## 2013-04-25 MED ORDER — FLUPHENAZINE HCL 5 MG PO TABS
5.0000 mg | ORAL_TABLET | Freq: Every day | ORAL | Status: DC
  Administered 2013-04-25 – 2013-04-26 (×2): 5 mg via ORAL
  Filled 2013-04-25 (×4): qty 1

## 2013-04-25 MED ORDER — FLUOXETINE HCL 20 MG PO CAPS
20.0000 mg | ORAL_CAPSULE | Freq: Every day | ORAL | Status: DC
  Administered 2013-04-25 – 2013-05-01 (×7): 20 mg via ORAL
  Filled 2013-04-25 (×7): qty 1

## 2013-04-25 NOTE — [facility] Suicide Risk Assessment (Signed)
Suicide Risk Assessment  Admission Assessment     Nursing information obtained from:    Demographic factors:    Current Mental Status:    Loss Factors:    Historical Factors:    Risk Reduction Factors:     CLINICAL FACTORS:   Severe Anxiety and/or Agitation Depression:   Anhedonia Hopelessness Insomnia Chronic Pain Currently Psychotic Previous Psychiatric Diagnoses and Treatments  COGNITIVE FEATURES THAT CONTRIBUTE TO RISK:  Closed-mindedness Polarized thinking    SUICIDE RISK:   Mild:  Suicidal ideation of limited frequency, intensity, duration, and specificity.  There are no identifiable plans, no associated intent, mild dysphoria and related symptoms, good self-control (both objective and subjective assessment), few other risk factors, and identifiable protective factors, including available and accessible social support.  PLAN OF CARE:1. Admit for crisis management and stabilization. 2. Medication management to reduce current symptoms to base line and improve the     patient's overall level of functioning 3. Treat health problems as indicated. 4. Develop treatment plan to decrease risk of relapse upon discharge and the need for     readmission. 5. Psycho-social education regarding relapse prevention and self care. 6. Health care follow up as needed for medical problems. 7. Restart home medications where appropriate.   I certify that inpatient services furnished can reasonably be expected to improve the patient's condition.  Blyss Lugar,MD 04/25/2013, 11:28 AM

## 2013-04-25 NOTE — [facility] Group Notes (Signed)
[facility] Group Therapy  04/25/2013 , 1:29 PM    Type of Therapy:  [facility] Presentation  Participation Level:  Active  Participation Quality:  Attentive  Affect:  Blunted  Cognitive:  Oriented  Insight:  Limited  Engagement in Therapy:  Engaged  Modes of Intervention:  Discussion, Education and Socialization  Summary of Progress/Problems:  Onalee Hua from [facility] came to present his recovery story and play the guitar.  Sat quietly through group.  Attentive, but did not engage the speaker.  Daryel Gerald B 04/25/2013 , 1:29 PM
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
## 2013-04-25 NOTE — [facility] Suicide Risk Assessment (Signed)
[facility] INPATIENT:  Family/Significant Other Suicide Prevention Education  Suicide Prevention Education:  Education Completed; No one  has been identified by the patient as the family member/significant other with whom the patient will be residing, and identified as the person(s) who will aid the patient in the event of a mental health crisis (suicidal ideations/suicide attempt).  With written consent from the patient, the family member/significant other has been provided the following suicide prevention education, prior to the and/or following the discharge of the patient.  The suicide prevention education provided includes the following:  Suicide risk factors  Suicide prevention and interventions  National Suicide Hotline telephone number  [place] assessment telephone number  [facility] Emergency Assistance 911  [facility] and/or Residential Mobile Crisis Unit telephone number  Request made of family/significant other to:  Remove weapons (e.g., guns, rifles, knives), all items previously/currently identified as safety concern.    Remove drugs/medications (over-the-counter, prescriptions, illicit drugs), all items previously/currently identified as a safety concern.  The family member/significant other verbalizes understanding of the suicide prevention education information provided.  The family member/significant other agrees to remove the items of safety concern listed above. The patient did not endorse SI at the time of admission, nor did the patient c/o SI during the stay here.  SPE not required.   Daryel Gerald B 04/25/2013, 4:02 PM

## 2013-04-25 NOTE — Progress Notes (Signed)
Recreation Therapy Notes  Date: 07.02.2014 Time: 9:30am Location: [address] Dayroom      Group Topic/Focus: Communication, Journalist, newspaper, Team Work  Participation Level: None  Participation Quality: Observation  Affect: Euthymic  Cognitive: Appropriate    Additional Comments: Activity: Cup Runner, broadcasting/film/video ; Explanation: Patients were asked to create a pyramid out of plastic cups. To build this pyramid patients were given a rubber band with four strings tied to it. Patients were instructed not to touch the cups with their hands and to only pick up the cups with the rubber bands.   Patient attended group session, but did not participate in group activity.   Allison Moore, LRT/CTRS  Allison Moore 04/25/2013 12:36 PM

## 2013-04-25 NOTE — [facility] Group Notes (Signed)
[facility] LCSW Aftercare Discharge Planning Group Note   04/25/2013 8:31 AM  Participation Quality:  Did not attend

## 2013-04-25 NOTE — Tx Team (Signed)
  Interdisciplinary Treatment Plan Update   Date Reviewed:  04/25/2013  Time Reviewed:  8:31 AM  Progress in Treatment:   Attending groups: Yes Participating in groups: Yes Taking medication as prescribed: Yes  Tolerating medication: Yes Family/Significant other contact made: No  Patient understands diagnosis: Yes  As evidenced by asking for help with depression, psychosis Discussing patient identified problems/goals with staff: Yes  See iniital plan Medical problems stabilized or resolved: Yes Denies suicidal/homicidal ideation: No  But has no intent or plan Patient has not harmed self or others: Yes  For review of initial/current patient goals, please see plan of care.  Estimated Length of Stay:  4-5 days  Reason for Continuation of Hospitalization: Depression Hallucinations Medication stabilization Suicidal ideation  New Problems/Goals identified:  N/A  Discharge Plan or Barriers:   return home, follow up outpt  Additional Comments:  "I have been feeling depressed and hearing voices for days."  History of Present Illness::Allison Moore is a 32 year old woman with history of mental illness dating back to age 45 when she was diagnosed with Schizophrenia and bipolar depression. She also reports that she was diagnosed with multiple sclerosis. She says that she was brought to the ED by her mother due to auditory and visual hallucinations. Patient reported that she has been having these hallucinations since January of 2014. Visual hallucinations consist of her seeing smoke. Auditory hallucinations consist of a voice telling her that she needs "to do better" and that she is going down the " wrong path." She also reports worsening depression, anxiety, feeling edgy, low energy level and lack of motivation. She worry a lot about her multiple medical problem and report recurrent suicidal thoughts with no significant plan to end her life. States that she drinks alcohol every night for the  past 10 years, smokes Marijuana occasional and a pack of cigarette daily.   Attendees:  Signature: Thedore Mins, MD 04/25/2013 8:31 AM   Signature: Richelle Ito, LCSW 04/25/2013 8:31 AM  Signature: Verne Spurr, PA 04/25/2013 8:31 AM  Signature:  04/25/2013 8:31 AM  Signature: Liborio Nixon, RN 04/25/2013 8:31 AM  Signature:  04/25/2013 8:31 AM  Signature:   04/25/2013 8:31 AM  Signature:    Signature:    Signature:    Signature:    Signature:    Signature:      Scribe for Treatment Team:   Richelle Ito, LCSW  04/25/2013 8:31 AM

## 2013-04-25 NOTE — Progress Notes (Signed)
D: Patient having passive SI but verbally agrees to contract for safety. The patient is also positive for auditory hallucinations and reports hearing "whispers." The patient denies HI and visual hallucinations. The patient reports sleeping poorly and writes that her appetite is improving and that her energy level is high. The patient exhibits thought blocking and is very slow to respond to questions. The patient is using a front wheel walker to ambulate. The patient states that she is unsteady because of her "mutiple sclerosis."  A: Patient given emotional support from RN. Patient encouraged to come to staff with concerns and/or questions. Patient's medication routine continued. Patient's orders and plan of care reviewed.  R: Patient remains cooperative. Will continue to monitor patient q15 minutes for safety.

## 2013-04-25 NOTE — Progress Notes (Signed)
Patient ID: Allison Moore, female   DOB: 11-04-1980, 32 y.o.   MRN: [id number] Pt did not attend group

## 2013-04-25 NOTE — [facility] Counselor (Signed)
Adult Comprehensive Assessment  Patient ID: Allison Moore, female   DOB: [birth year], 32 y.o.   MRN: [id number]  Information Source: Information source: Patient  Current Stressors:  Educational / Learning stressors: N/A Employment / Job issues: N/A Family Relationships: Yes  Careers information officer / Lack of resources (include bankruptcy): Yes  Fixed income Housing / Lack of housing: N/A Physical health (include injuries & life threatening diseases): N/A Social relationships: N/A Substance abuse: N/A Bereavement / Loss: N/A  Living/Environment/Situation:  Living Arrangements: Parent How long has patient lived in current situation?: 3 years or so What is atmosphere in current home: Supportive  Family History:  Marital status: Single Does patient have children?: Yes How many children?: 1 How is patient's relationship with their children?: close  Childhood History:  By whom was/is the patient raised?: Both parents Description of patient's relationship with caregiver when they were a child: good Patient's description of current relationship with people who raised him/her: good with father, conflictual with mother Does patient have siblings?: Yes Number of Siblings: 6 Description of patient's current relationship with siblings: close Did patient suffer any verbal/emotional/physical/sexual abuse as a child?: No Did patient suffer from severe childhood neglect?: No Has patient ever been sexually abused/assaulted/raped as an adolescent or adult?: No Was the patient ever a victim of a crime or a disaster?: No Witnessed domestic violence?: No Has patient been effected by domestic violence as an adult?: No  Education:  Highest grade of school patient has completed: 12 plus  Attended [employer] briefly Currently a student?: No Learning disability?: No  Employment/Work Situation:   Employment situation: On disability Why is patient on disability: MS, mental health How long has patient been  on disability: 7 years Patient's job has been impacted by current illness: No What is the longest time patient has a held a job?: 1 yr Where was the patient employed at that time?: [employer] Has patient ever been in the [employer]?: No Has patient ever served in Buyer, retail?: No  Financial Resources:   Surveyor, quantity resources: Insurance claims handler Does patient have a Lawyer or guardian?: No  Alcohol/Substance Abuse:   Alcohol/Substance Abuse Treatment Hx: Denies past history Has alcohol/substance abuse ever caused legal problems?: No  Social Support System:   Forensic psychologist System: Good Type of faith/religion: Ephriam Knuckles How does patient's faith help to cope with current illness?: Gives me hope  Leisure/Recreation:   Leisure and Hobbies: play with phone  Strengths/Needs:   What things does the patient do well?: video games In what areas does patient struggle / problems for patient: none  Discharge Plan:   Does patient have access to transportation?: Yes Will patient be returning to same living situation after discharge?: Yes Currently receiving community mental health services: No If no, would patient like referral for services when discharged?: Yes (What county?) Medical sales representative) Does patient have financial barriers related to discharge medications?: No  Summary/Recommendations:   Summary and Recommendations (to be completed by the evaluator): Allison Moore is a 32 YO AA female who presents with psychosis.  Precipitant appears to be non-compliance with medication,and daily cannabis use.  She can benefit from crises stabilization, medication management, therapeutic milieu and referral for services.  Daryel Gerald B. 04/25/2013

## 2013-04-26 NOTE — Progress Notes (Signed)
[facility] MD Progress Note  04/26/2013 2:29 PM Eirene Rather  MRN:  [id number] Subjective:  "How am I doing? Am I using this correctly?" Objective:  The patient has good eye contact, her mood is calm her affect is congruent. Her speech is rambling and disconnected. She has no linear thinking at this point and she is clearly disorganized. She tells this provider that she loves her. Diagnosis:  Schizoaffective disorder  ADL's:  Intact  Sleep: Good  Appetite:  Good  Suicidal Ideation:  UTA Homicidal Ideation:  UTA AEB (as evidenced by):The patients report of symptoms, affect, mood, and behaviors.  Psychiatric Specialty Exam: Review of Systems  Constitutional: Negative.  Negative for fever, chills, weight loss, malaise/fatigue and diaphoresis.  HENT: Negative for congestion and sore throat.   Eyes: Negative for blurred vision, double vision and photophobia.  Respiratory: Negative for cough, shortness of breath and wheezing.   Cardiovascular: Negative for chest pain, palpitations and PND.  Gastrointestinal: Negative for heartburn, nausea, vomiting, abdominal pain, diarrhea and constipation.  Musculoskeletal: Negative for myalgias, joint pain and falls.  Neurological: Negative for dizziness, tingling, tremors, sensory change, speech change, focal weakness, seizures, loss of consciousness, weakness and headaches.  Endo/Heme/Allergies: Negative for polydipsia. Does not bruise/bleed easily.  Psychiatric/Behavioral: Negative for depression, suicidal ideas, hallucinations, memory loss and substance abuse. The patient is not nervous/anxious and does not have insomnia.     Blood pressure 137/89, pulse 96, temperature 97.8 F (36.6 C), temperature source Oral, resp. rate 18, height 5\' 4"  (1.626 m), weight 105.688 kg (233 lb), last menstrual period 04/18/2013, SpO2 99.00%.Body mass index is 39.97 kg/(m^2).  General Appearance: Casual  Eye Contact::  Good  Speech:  Disorganized babbling  disconnected speech is irrelevant while understandable it is not coherent.  Volume:  Normal  Mood:  Anxious and Depressed  Affect:  Congruent  Thought Process:  Disorganized  Orientation:  NA  Thought Content:  NA  Suicidal Thoughts:  UTA   Homicidal Thoughts:  UTA   Memory:  UTA   Judgement:  Impaired  Insight:  Lacking  Psychomotor Activity:  Normal  Concentration:  Fair  Recall:  Fair  Akathisia:  No  Handed:  Right  AIMS (if indicated):     Assets:  Desire for Improvement  Sleep:  Number of Hours: 5.75   Current Medications: Current Facility-Administered Medications  Medication Dose Route Frequency Provider Last Rate Last Dose  . alum & mag hydroxide-simeth (MAALOX/MYLANTA) 200-200-20 MG/5ML suspension 30 mL  30 mL Oral Q4H PRN Kerry Hough, PA-C      . benztropine (COGENTIN) tablet 1 mg  1 mg Oral QHS Mojeed Akintayo   1 mg at 04/25/13 2151  . FLUoxetine (PROZAC) capsule 20 mg  20 mg Oral Daily Mojeed Akintayo   20 mg at 04/26/13 0750  . fluPHENAZine (PROLIXIN) tablet 5 mg  5 mg Oral QHS Mojeed Akintayo   5 mg at 04/25/13 2151  . hydrOXYzine (ATARAX/VISTARIL) tablet 25 mg  25 mg Oral Q6H PRN Kerry Hough, PA-C   25 mg at 04/24/13 2120  . ibuprofen (ADVIL,MOTRIN) tablet 600 mg  600 mg Oral Q6H PRN Kerry Hough, PA-C   600 mg at 04/25/13 2158  . magnesium hydroxide (MILK OF MAGNESIA) suspension 30 mL  30 mL Oral Daily PRN Kerry Hough, PA-C      . traZODone (DESYREL) tablet 50 mg  50 mg Oral QHS PRN Mojeed Akintayo   50 mg at 04/25/13 2154    Lab  Results: No results found for this or any previous visit (from the past 48 hour(s)).  Physical Findings: AIMS: Facial and Oral Movements Muscles of Facial Expression: None, normal Lips and Perioral Area: None, normal Jaw: None, normal Tongue: None, normal,Extremity Movements Upper (arms, wrists, hands, fingers): None, normal Lower (legs, knees, ankles, toes): None, normal, Trunk Movements Neck, shoulders, hips:  None, normal, Overall Severity Severity of abnormal movements (highest score from questions above): None, normal Incapacitation due to abnormal movements: None, normal Patient's awareness of abnormal movements (rate only patient's report): No Awareness, Dental Status Current problems with teeth and/or dentures?: No Does patient usually wear dentures?: No  CIWA:    COWS:     Treatment Plan Summary: Daily contact with patient to assess and evaluate symptoms and progress in treatment Medication management  Plan: 1. Continue crisis management and stabilization. 2. Medication management to reduce current symptoms to base line and improve patient's overall level of functioning 3. Treat health problems as indicated. 4. Develop treatment plan to decrease risk of relapse upon discharge and the need for readmission. 5. Psycho-social education regarding relapse prevention and self care. 6. Health care follow up as needed for medical problems. 7. Continue home medications where appropriate.    Medical Decision Making Problem Points:  Established problem, stable/improving (1) Data Points:  Review of medication regiment & side effects (2)  I certify that inpatient services furnished can reasonably be expected to improve the patient's condition.  Rona Ravens. Ianmichael Amescua RPAC 2:42 PM 04/26/2013

## 2013-04-26 NOTE — [facility] Group Notes (Signed)
[facility] LCSW Group Therapy  04/26/2013  1:15 PM  Type of Therapy:  Group Therapy  Participation Level:  Active  Participation Quality:  Appropriate and Attentive  Affect:  Appropriate, Flat  Cognitive:  Alert and Appropriate  Insight:  Developing/Improving and Engaged  Engagement in Therapy:  Developing/Improving and Engaged  Modes of Intervention:  Clarification, Confrontation, Discussion, Education, Exploration, Limit-setting, Orientation, Problem-solving, Rapport Building, Dance movement psychotherapist, Socialization and Support  Summary of Progress/Problems: The topic for group was balance in life.  Pt participated in the discussion about when their life was in balance and out of balance and how this feels.  Pt discussed ways to get back in balance and short term goals they can work on to get where they want to be.  Pt participated in reading a quote related to balance in life and having to let go of certain things in life to move forward.  Pt began reading from the daily workbook about feelings.  CSW asked why pt chose to share this with group and pt explained that it was yesterday's workbook and she has been thinking a lot about her feelings today.  Pt was easily redirected to the group topic and pt was bale to share that life has it's ups and downs and currently she is coping with using a walker and caring for her son.  Pt actively participate and was engaged in group discussion.    Reyes Ivan, LCSWA 04/26/2013 2:45 PM

## 2013-04-26 NOTE — Progress Notes (Signed)
Patient ID: Allison Moore, female   DOB: [birth year], 33 y.o.   MRN: [id number] D: pt. In room preparing clothes for shower. Pt. Clearly preoccupied although she denies voices. Pt. Briefly speaks to Clinical research associate saying she is getting ready for shower. A: Writer introduced self to client and encouraged group. Staff will monitor q39min for safety. R: pt. Is safe on the unit and attended karaoke.

## 2013-04-26 NOTE — Progress Notes (Signed)
Pt attended karaoke group this evening. Pt participated with peers. 

## 2013-04-26 NOTE — Progress Notes (Signed)
D: Patient having passive SI but verbally agrees to contract for safety. The patient is also positive for auditory hallucinations and reports still hearing "whispers." The patient denies HI and visual hallucinations. The patient has a depressed mood and affect. The patient did not complete self-inventory form saying that she "did not understand it." The patient still exhibits thought blocking and is very slow to respond to questions. The patient is using a front wheel walker to ambulate. The patient is occasionally tearful but when interacting with staff, patient is unable to state reason for sadness/crying.  A: Patient given emotional support from RN. Patient encouraged to come to staff with concerns and/or questions. Patient's medication routine continued. Patient's orders and plan of care reviewed.   R: Patient remains cooperative. Will continue to monitor patient q15 minutes for safety.

## 2013-04-26 NOTE — Progress Notes (Signed)
Patient ID: Vaneta Hammontree, female   DOB: 02/20/1981, 32 y.o.   MRN: [id number] D: Patient denies SI/HI/AVH and pain. Pt has a lot of thought blocking and takes a long time to response to questions from Clinical research associate. Pt mood/ affect was depressed and flat. Pt unable to attend evening wrap up group because of environmental work in dayroom. Pt denies any needs or concerns.  Cooperative with assessment. No acute distressed noted at this time.   A: Met with pt 1:1. Medications administered as prescribed. Writer encouraged pt to discuss feelings. Pt encouraged to come to staff with any question or concerns. 15 minutes checks for safety.  R: Patient remains safe. She is complaint with medications and denies any adverse reaction. Continue current POC.

## 2013-04-27 DIAGNOSIS — F1210 Cannabis abuse, uncomplicated: Secondary | ICD-10-CM | POA: Diagnosis present

## 2013-04-27 MED ORDER — FLUPHENAZINE HCL 5 MG PO TABS
7.5000 mg | ORAL_TABLET | Freq: Every day | ORAL | Status: DC
  Administered 2013-04-27 – 2013-04-30 (×4): 7.5 mg via ORAL
  Filled 2013-04-27 (×5): qty 2

## 2013-04-27 NOTE — Progress Notes (Signed)
Adult Psychoeducational Group Note  Date:  04/27/2013 Time:  2000  Group Topic/Focus:  Wrap-Up Group:   The focus of this group is to help patients review their daily goal of treatment and discuss progress on daily workbooks.  Participation Level:  Active  Participation Quality:  Appropriate  Affect:  Appropriate  Cognitive:  Appropriate  Insight: Good  Engagement in Group:  Engaged  Modes of Intervention:  Discussion  Additional Comments:  Pt attended wrap-up group this evening. Pt described her day as being "pretty good". Pt stated that her mom came to visit today. Pt is looking forward to being discharge. Pt plans on taking her medication and following up with her doctor once she's discharge.   Zaharah Amir A 04/27/2013, 10:41 PM

## 2013-04-27 NOTE — Progress Notes (Signed)
D: Patient having passive SI but verbally agrees to contract for safety. The patient is still having auditory hallucinations but cannot describe what she is hearing. The patient just states that she "doesn't know what the voices are saying." The patient denies HI and visual hallucinations. The patient has a depressed mood and affect. The patient did not complete self-inventory form. The patient still exhibits thought blocking and is very slow to respond to questions. The patient is using a front wheel walker to ambulate. The patient is still occasionally tearful but when interacting with staff, patient is unable to state reason for sadness/crying.   A: Patient given emotional support from RN. Patient encouraged to come to staff with concerns and/or questions. Patient's medication routine continued. Patient's orders and plan of care reviewed.   R: Patient remains cooperative. Will continue to monitor patient q15 minutes for safety.

## 2013-04-27 NOTE — Progress Notes (Signed)
Recreation Therapy Notes  Date: 07.04.2014 Time: 9:30am Location: [address] Dayroom      Group Topic/Focus: Reminiscence, Communication  Participation Level: Active  Participation Quality: Appropriate, Sharing  Affect: Euthymic  Cognitive: Appropriate  Additional Comments: Activity: 4th of July Reminiscence; Explanation: Patients selected a word associated with the 4th of July out of a container and shared a personal story with the group relating to that word. Patients were encouraged to relate to each other.   Patient listened to discussion about the importance of discussing positive memories. Patient appeared to sleep during first half of group session, patient sat up in chair, but had eyes closed. Group conversation turned to family, this greatly upset patient. Patient became visibly upset, covered her mouth with her hand and appeared to cry. However patient expressed no tears during episode. Patient indicated that she misses her son. Patient stated he mother has custody of her son and she desperately wants him back. Patient stated she was under the impression she was getting her son back following her last admission to a behavioral health hospital, but that did not happen. Patient stated her son says hurtful things to her, such as "I hate you." Patient indicated this is very painful for her. Peers offered patient support which she accepted and voiced appreciation for. LRT offered patient support and encouraged patient to process her feelings surrounding her relationship with her son during her admission. Patient stated to LRT that she has not been sharing this with other staff members and that other staff members are under the impression she is upset due to a romantic relationship. Following this statement patient asked LRT to help her fill out her self-inventory. Patient stated "Cant you help me fill this out, you've helped me fill this form out at my house." LRT assured patient that she  has never been to patient home and that she has not previously assisted her with any paperwork. Patient was receptive to LRT explanation and stated she would be able to fill the form out herself.   Marykay Lex Kersten Salmons, LRT/CTRS  Walton Digilio L 04/27/2013 12:22 PM

## 2013-04-27 NOTE — Progress Notes (Signed)
Patient ID: Allison Moore, female   DOB: 09/08/1981, 32 y.o.   MRN: [id number] [facility] MD Progress Note  04/27/2013 1:48 PM Allison Moore  MRN:  [id number] Subjective: Patient states she is happy to be here and that the visiting hours are changed to 1-7 today due to the holiday.  Objective:  The patient has a bright affect today, but is still disorganized. Her speech does contain some complete coherent sentences today, but they are bracketed by unrelated words.  She does report AVH/ and VH today.  Diagnosis:  Schizoaffective disorder  ADL's:  Intact  Sleep: Good  Appetite:  Good  Suicidal Ideation:  UTA Homicidal Ideation:  UTA AEB (as evidenced by):The patients report of symptoms, affect, mood, and behaviors.  Psychiatric Specialty Exam: Review of Systems  Constitutional: Negative.  Negative for fever, chills, weight loss, malaise/fatigue and diaphoresis.  HENT: Negative for congestion and sore throat.   Eyes: Negative for blurred vision, double vision and photophobia.  Respiratory: Negative for cough, shortness of breath and wheezing.   Cardiovascular: Negative for chest pain, palpitations and PND.  Gastrointestinal: Negative for heartburn, nausea, vomiting, abdominal pain, diarrhea and constipation.  Musculoskeletal: Negative for myalgias, joint pain and falls.  Neurological: Negative for dizziness, tingling, tremors, sensory change, speech change, focal weakness, seizures, loss of consciousness, weakness and headaches.  Endo/Heme/Allergies: Negative for polydipsia. Does not bruise/bleed easily.  Psychiatric/Behavioral: Negative for depression, suicidal ideas, hallucinations, memory loss and substance abuse. The patient is not nervous/anxious and does not have insomnia.     Blood pressure 137/88, pulse 95, temperature 98 F (36.7 C), temperature source Oral, resp. rate 18, height 5\' 4"  (1.626 m), weight 105.688 kg (233 lb), last menstrual period 04/18/2013, SpO2 99.00%.Body  mass index is 39.97 kg/(m^2).  General Appearance: Casual  Eye Contact::  Good  Speech:  She is making complete sentences today, but still rambles and is disorganized.  Volume:  Normal  Mood:  Anxious and Depressed  Affect:  Congruent  Thought Process:  Disorganized  Orientation:  NA  Thought Content:  Does report AV/H hallucinations today.  Suicidal Thoughts:  UTA   Homicidal Thoughts:  UTA   Memory:  UTA   Judgement:  Impaired  Insight:  Lacking  Psychomotor Activity:  Normal  Concentration:  Fair  Recall:  Fair  Akathisia:  No  Handed:  Right  AIMS (if indicated):     Assets:  Desire for Improvement  Sleep:  Number of Hours: 6   Current Medications: Current Facility-Administered Medications  Medication Dose Route Frequency Provider Last Rate Last Dose  . alum & mag hydroxide-simeth (MAALOX/MYLANTA) 200-200-20 MG/5ML suspension 30 mL  30 mL Oral Q4H PRN Kerry Hough, PA-C      . benztropine (COGENTIN) tablet 1 mg  1 mg Oral QHS Mojeed Akintayo   1 mg at 04/26/13 2139  . FLUoxetine (PROZAC) capsule 20 mg  20 mg Oral Daily Mojeed Akintayo   20 mg at 04/27/13 0732  . fluPHENAZine (PROLIXIN) tablet 5 mg  5 mg Oral QHS Mojeed Akintayo   5 mg at 04/26/13 2140  . hydrOXYzine (ATARAX/VISTARIL) tablet 25 mg  25 mg Oral Q6H PRN Kerry Hough, PA-C   25 mg at 04/26/13 1818  . ibuprofen (ADVIL,MOTRIN) tablet 600 mg  600 mg Oral Q6H PRN Kerry Hough, PA-C   600 mg at 04/26/13 2332  . magnesium hydroxide (MILK OF MAGNESIA) suspension 30 mL  30 mL Oral Daily PRN Kerry Hough, PA-C      .  traZODone (DESYREL) tablet 50 mg  50 mg Oral QHS PRN Mojeed Akintayo   50 mg at 04/26/13 2140    Lab Results: No results found for this or any previous visit (from the past 48 hour(s)).  Physical Findings: AIMS: Facial and Oral Movements Muscles of Facial Expression: None, normal Lips and Perioral Area: None, normal Jaw: None, normal Tongue: None, normal,Extremity Movements Upper (arms,  wrists, hands, fingers): None, normal Lower (legs, knees, ankles, toes): None, normal, Trunk Movements Neck, shoulders, hips: None, normal, Overall Severity Severity of abnormal movements (highest score from questions above): None, normal Incapacitation due to abnormal movements: None, normal Patient's awareness of abnormal movements (rate only patient's report): No Awareness, Dental Status Current problems with teeth and/or dentures?: No Does patient usually wear dentures?: No  CIWA:    COWS:     Treatment Plan Summary: Daily contact with patient to assess and evaluate symptoms and progress in treatment Medication management  Plan: 1. Continue crisis management and stabilization. 2. Medication management to reduce current symptoms to base line and improve patient's overall level of functioning 3. Treat health problems as indicated. 4. Develop treatment plan to decrease risk of relapse upon discharge and the need for readmission. 5. Psycho-social education regarding relapse prevention and self care. 6. Health care follow up as needed for medical problems. 7. Continue home medications where appropriate. 8. Will increase fluphenazine to 7.5mg  tonight. 9. ELOS: 4-6 days.   Medical Decision Making Problem Points:  Established problem, stable/improving (1) Data Points:  Review of medication regiment & side effects (2)  I certify that inpatient services furnished can reasonably be expected to improve the patient's condition.  Rona Ravens. Dell Briner RPAC 1:48 PM 04/27/2013

## 2013-04-28 DIAGNOSIS — F259 Schizoaffective disorder, unspecified: Principal | ICD-10-CM

## 2013-04-28 NOTE — Progress Notes (Signed)
Writer spoke with patient who reports having had a good day. Patient reports that her family visited on today and she was glad to see them. Patient c/o lower back pain and received a heat pack instead of ibuprofen because it was too soon for another dose of ibuprofen. Writer informed patient of meds scheduled and she is agreeable to taking them at hs. Patient denies si/hi/a/v hall. Support and encouragement offered, safety maintained on unit with 15 min checks. Will continue to monitor.

## 2013-04-28 NOTE — Progress Notes (Signed)
Patient ID: Allison Moore, female   DOB: 22-Nov-1980, 32 y.o.   MRN: [id number] [facility] MD Progress Note  04/28/2013 6:12 PM Kadija Cruzen  MRN:  [id number] Subjective:   Feels like getting better now. Wants to leave soon. meds are helping.  Objective:  The patient has a bright affect today, but is still disorganized. Her speech does contain some complete coherent sentences today, but they are bracketed by unrelated words.  She does report AVH/ and VH today.  Diagnosis:  Schizoaffective disorder  ADL's:  Intact  Sleep: Good  Appetite:  Good  Suicidal Ideation:  UTA Homicidal Ideation:  UTA AEB (as evidenced by):The patients report of symptoms, affect, mood, and behaviors.  Psychiatric Specialty Exam: Review of Systems  Constitutional: Negative.  Negative for fever, chills, weight loss, malaise/fatigue and diaphoresis.  HENT: Negative for congestion and sore throat.   Eyes: Negative for blurred vision, double vision and photophobia.  Respiratory: Negative for cough, shortness of breath and wheezing.   Cardiovascular: Negative for chest pain, palpitations and PND.  Gastrointestinal: Negative for heartburn, nausea, vomiting, abdominal pain, diarrhea and constipation.  Musculoskeletal: Negative for myalgias, joint pain and falls.  Neurological: Negative for dizziness, tingling, tremors, sensory change, speech change, focal weakness, seizures, loss of consciousness, weakness and headaches.  Endo/Heme/Allergies: Negative for polydipsia. Does not bruise/bleed easily.  Psychiatric/Behavioral: Negative for depression, suicidal ideas, hallucinations, memory loss and substance abuse. The patient is not nervous/anxious and does not have insomnia.     Blood pressure 109/70, pulse 70, temperature 98.7 F (37.1 C), temperature source Oral, resp. rate 16, height 5\' 4"  (1.626 m), weight 105.688 kg (233 lb), last menstrual period 04/18/2013, SpO2 99.00%.Body mass index is 39.97 kg/(m^2).   General Appearance: Casual  Eye Contact::  Good  Speech:  She is making complete sentences today, but still rambles and is disorganized.  Volume:  Normal  Mood:  Anxious and Depressed  Affect:  Congruent  Thought Process:  Disorganized  Orientation:  NA  Thought Content:  Does report AV/H hallucinations today.  Suicidal Thoughts:  UTA   Homicidal Thoughts:  UTA   Memory:  UTA   Judgement:  Impaired  Insight:  Lacking  Psychomotor Activity:  Normal  Concentration:  Fair  Recall:  Fair  Akathisia:  No  Handed:  Right  AIMS (if indicated):     Assets:  Desire for Improvement  Sleep:  Number of Hours: 6   Current Medications: Current Facility-Administered Medications  Medication Dose Route Frequency Provider Last Rate Last Dose  . alum & mag hydroxide-simeth (MAALOX/MYLANTA) 200-200-20 MG/5ML suspension 30 mL  30 mL Oral Q4H PRN Kerry Hough, PA-C      . benztropine (COGENTIN) tablet 1 mg  1 mg Oral QHS Mojeed Akintayo   1 mg at 04/26/13 2139  . FLUoxetine (PROZAC) capsule 20 mg  20 mg Oral Daily Mojeed Akintayo   20 mg at 04/27/13 0732  . fluPHENAZine (PROLIXIN) tablet 5 mg  5 mg Oral QHS Mojeed Akintayo   5 mg at 04/26/13 2140  . hydrOXYzine (ATARAX/VISTARIL) tablet 25 mg  25 mg Oral Q6H PRN Kerry Hough, PA-C   25 mg at 04/26/13 1818  . ibuprofen (ADVIL,MOTRIN) tablet 600 mg  600 mg Oral Q6H PRN Kerry Hough, PA-C   600 mg at 04/26/13 2332  . magnesium hydroxide (MILK OF MAGNESIA) suspension 30 mL  30 mL Oral Daily PRN Kerry Hough, PA-C      . traZODone (DESYREL) tablet 50 mg  50 mg Oral QHS PRN Mojeed Akintayo   50 mg at 04/26/13 2140    Lab Results: No results found for this or any previous visit (from the past 48 hour(s)).  Physical Findings: AIMS: Facial and Oral Movements Muscles of Facial Expression: None, normal Lips and Perioral Area: None, normal Jaw: None, normal Tongue: None, normal,Extremity Movements Upper (arms, wrists, hands, fingers): None,  normal Lower (legs, knees, ankles, toes): None, normal, Trunk Movements Neck, shoulders, hips: None, normal, Overall Severity Severity of abnormal movements (highest score from questions above): None, normal Incapacitation due to abnormal movements: None, normal Patient's awareness of abnormal movements (rate only patient's report): No Awareness, Dental Status Current problems with teeth and/or dentures?: No Does patient usually wear dentures?: No  CIWA:    COWS:     Treatment Plan Summary: Daily contact with patient to assess and evaluate symptoms and progress in treatment Medication management  Plan: 1. Continue crisis management and stabilization. 2. Medication management to reduce current symptoms to base line and improve patient's overall level of functioning    Medical Decision Making Problem Points:  Established problem, stable/improving (1) Data Points:  Review of medication regiment & side effects (2)  I certify that inpatient services furnished can reasonably be expected to improve the patient's condition.   6:12 PM 04/28/2013

## 2013-04-28 NOTE — Progress Notes (Addendum)
Patient ID: Allison Moore, female   DOB: [birth year], 32 y.o.   MRN: [id number]  Writer confirmed with [facility] on [address] in [place], [place] [phone] that pt was receiving Copaxone 20 mg/day subcutaneous injections, but has not had those since January due to not being refilled by primary care provider. Pt was not making her scheduled appointments.

## 2013-04-28 NOTE — Progress Notes (Signed)
The focus of this group is to help patients review their daily goal of treatment and discuss progress on daily workbooks. Pt attended the evening group session and responded to all discussion prompts. Pt reported having a very good day on account of her family coming to visit her. Pt stated that she didn't know that they loved her enough to visit her and that she definitely now "felt the love and support." The joy today's visit brought the Pt was very visible on her face when she spoke of it. Pt reported having no additional needs from Nursing Staff this evening. Pt's affect was bright.

## 2013-04-28 NOTE — Progress Notes (Signed)
Patient ID: Allison Moore, female   DOB: 24-Dec-1980, 32 y.o.   MRN: [id number] D: Patient denies SI/HI/AVH and pain. Pt has a lot of thought blocking and takes a long time to response to questions from Clinical research associate. Pt mood/ affect was depressed and flat.  Pt denies any needs or concerns.  Cooperative with assessment. No acute distressed noted at this time.   A: Met with pt 1:1. Medications administered as prescribed. Writer encouraged pt to discuss feelings. Pt encouraged to come to staff with any question or concerns. 15 minutes checks for safety.  R: Patient remains safe. She is complaint with medications and denies any adverse reaction. Continue current POC.

## 2013-04-28 NOTE — Progress Notes (Signed)
D: Affect blunted, mood depressed. Appeared disorganized in her thinking during group. Came to group, held up some purple under garment, left group to take a shower, and then returned. She said that she was talking to family and then acknowledged that she was probably hallucinations. A: Spent 1:1 time with pt. Observed interacting in the milieu by staff. Support and encouragement offered. R: Following treatment plan. Contracts for safety.

## 2013-04-28 NOTE — [facility] Group Notes (Signed)
[facility] Group Notes:  (Nursing/MHT/Case Management/Adjunct)  Date:  04/28/2013  Time:  10:32 AM  Type of Therapy:  Psychoeducational Skills  Participation Level:  None  Participation Quality:  Resistant  Affect:  Depressed, Flat and Resistant  Cognitive:  Disorganized  Insight:  None  Engagement in Group:  None  Modes of Intervention:  Problem-solving  Summary of Progress/Problems:Pt attended healthy coping skills group, patient did not engage.   Jacquelyne Balint Shanta 04/28/2013, 10:32 AM

## 2013-04-29 NOTE — [facility] Group Notes (Signed)
[facility] Group Notes:  (Nursing/MHT/Case Management/Adjunct)  Date:  04/29/2013  Time:  12:34 PM  Type of Therapy:  Psychoeducational Skills  Participation Level:  Minimal  Participation Quality:  Resistant  Affect:  Depressed and Flat  Cognitive:  Disorganized  Insight:  Lacking  Engagement in Group:  None  Modes of Intervention:  Problem-solving  Summary of Progress/Problems: Pt did attend groups,and attempted to engage in treatment however seemed to have thought blocking.  Jacquelyne Balint Shanta 04/29/2013, 12:34 PM

## 2013-04-29 NOTE — Progress Notes (Signed)
[facility] MD Progress Note  04/29/2013 1:16 PM Allison Moore  MRN:  [id number] Subjective:  Patient notes that she is having some sadness today because she got 2 snacks says she slept okay is eating okay and is asking about when she is going home. Diagnosis:  Schizoaffective disorder  ADL's:  Intact  Sleep: Fair  Appetite:  Fair  Suicidal Ideation:  Deny Homicidal Ideation:  Denies AEB (as evidenced by): By patient's report of decreasing symptoms, affect, reports of improved appetite and sleep, behavior and participation in unit programming.  Psychiatric Specialty Exam: Review of Systems  Unable to perform ROS: mental acuity    Blood pressure 115/71, pulse 79, temperature 98.3 F (36.8 C), temperature source Oral, resp. rate 16, height 5\' 4"  (1.626 m), weight 105.688 kg (233 lb), last menstrual period 04/18/2013, SpO2 99.00%.Body mass index is 39.97 kg/(m^2).  General Appearance: Casual  Eye Contact::  Fair  Speech:  Normal Rate  Volume:  Normal  Mood:  Depressed  Affect:  Congruent  Thought Process:  Disorganized  Orientation:  NA  Thought Content:  WDL  Suicidal Thoughts:  No  Homicidal Thoughts:  No  Memory:  NA  Judgement:  Impaired  Insight:  Lacking  Psychomotor Activity:  Normal  Concentration:  Fair  Recall:  Fair  Akathisia:  No  Handed:  Right  AIMS (if indicated):     Assets:  Desire for Improvement Housing Social Support  Sleep:  Number of Hours: 3.75   Current Medications: Current Facility-Administered Medications  Medication Dose Route Frequency Provider Last Rate Last Dose  . alum & mag hydroxide-simeth (MAALOX/MYLANTA) 200-200-20 MG/5ML suspension 30 mL  30 mL Oral Q4H PRN Kerry Hough, PA-C      . benztropine (COGENTIN) tablet 1 mg  1 mg Oral QHS Mojeed Akintayo   1 mg at 04/28/13 2116  . FLUoxetine (PROZAC) capsule 20 mg  20 mg Oral Daily Mojeed Akintayo   20 mg at 04/29/13 0812  . fluPHENAZine (PROLIXIN) tablet 7.5 mg  7.5 mg Oral QHS Verne Spurr, PA-C   7.5 mg at 04/28/13 2115  . hydrOXYzine (ATARAX/VISTARIL) tablet 25 mg  25 mg Oral Q6H PRN Kerry Hough, PA-C   25 mg at 04/26/13 1818  . ibuprofen (ADVIL,MOTRIN) tablet 600 mg  600 mg Oral Q6H PRN Kerry Hough, PA-C   600 mg at 04/29/13 0404  . magnesium hydroxide (MILK OF MAGNESIA) suspension 30 mL  30 mL Oral Daily PRN Kerry Hough, PA-C      . traZODone (DESYREL) tablet 50 mg  50 mg Oral QHS PRN Mojeed Akintayo   50 mg at 04/28/13 2115    Lab Results: No results found for this or any previous visit (from the past 48 hour(s)).  Physical Findings: AIMS: Facial and Oral Movements Muscles of Facial Expression: None, normal Lips and Perioral Area: None, normal Jaw: None, normal Tongue: None, normal,Extremity Movements Upper (arms, wrists, hands, fingers): None, normal Lower (legs, knees, ankles, toes): None, normal, Trunk Movements Neck, shoulders, hips: None, normal, Overall Severity Severity of abnormal movements (highest score from questions above): None, normal Incapacitation due to abnormal movements: None, normal Patient's awareness of abnormal movements (rate only patient's report): No Awareness, Dental Status Current problems with teeth and/or dentures?: No Does patient usually wear dentures?: No  CIWA:    COWS:     Treatment Plan Summary: Daily contact with patient to assess and evaluate symptoms and progress in treatment Medication management  Plan: 1. Continue  crisis management and stabilization. 2. Medication management to reduce current symptoms to base line and improve patient's overall level of functioning 3. Treat health problems as indicated. 4. Develop treatment plan to decrease risk of relapse upon discharge and the need for     readmission. 5. Psycho-social education regarding relapse prevention and self care. 6. Health care follow up as needed for medical problems. 7. Continue home medications where appropriate. 8. will have case  manager contact family members to see if patient is at her baseline behaviors anticipate discharge to 3 days. Medical Decision Making Problem Points:  Established problem, stable/improving (1) Data Points:  Review of medication regiment & side effects (2)  I certify that inpatient services furnished can reasonably be expected to improve the patient's condition.  Rona Ravens. Nabil Bubolz RPAC 1:16 PM 04/29/2013

## 2013-04-29 NOTE — [facility] Group Notes (Signed)
[facility] Group Notes:  (Clinical Social Work)  04/29/2013   11:15-11:45AM  Summary of Progress/Problems:  The main focus of today's process group was to listen to a variety of genres of music and to identify that different types of music provoke different responses.  The patient then was able to identify personally what was soothing for them, as well as energizing.  Handouts were used to record feelings evoked, as well as how patient can personally use this knowledge in sleep habits, with depression, and with other symptoms.  The patient expressed her feelings about different music types at the beginning of group, but then became drowsy and seemed at times to be responding to a headache, rubbing her eyes.  Type of Therapy:  Music Therapy   Participation Level:  Minimal  Participation Quality:  Drowsy  Affect:  Flat  Cognitive:  Oriented  Insight:  Limited  Engagement in Therapy:  Limited  Modes of Intervention:   Activity, Exploration  Ambrose Mantle, LCSW 04/29/2013, 1:18 PM

## 2013-04-29 NOTE — Progress Notes (Signed)
04-29-13  NSG NOTE  7a-7p  D: Affect is blunted and depressed, brightens very little with interaction.  Mood is depressed.  Behavior is appropriate with encouragement, direction and support.  Interacts appropriately with peers and staff.  Reports being homesick, continues to state she misses her boyfriend, reports improving appetite and fair sleep.   A:  Medications per MD order.  Support given throughout day.  1:1 time spent with pt.  R:  Following treatment plan.  Denies HI/SI, auditory or visual hallucinations.  Contracts for safety.

## 2013-04-29 NOTE — Progress Notes (Signed)
Writer spoke with patient and she reports having had a good day and inquired if the doctor wrote anything concerning her being discharged on tomorrow. Writer informed patient that if doctor did not mention possible discharge on tomorrow then she may need to speak with her doctor on Monday concerning this. Patient appears in good spirits, currently denies si,hi,a,v hallucinations. Support and encouragement offered, safety maintained with 15 min checks, will continue to monitor.

## 2013-04-29 NOTE — Progress Notes (Signed)
Adult Psychoeducational Group Note  Date:  04/29/2013 Time:  2030  Group Topic/Focus:  Wrap-Up Group:   The focus of this group is to help patients review their daily goal of treatment and discuss progress on daily workbooks.  Participation Level:  Active  Participation Quality:  Appropriate  Affect:  Appropriate  Cognitive:  Appropriate  Insight: Good  Engagement in Group:  Engaged  Modes of Intervention:  Discussion  Additional Comments:  Pt stated she was ready to return home soon w/ MDs permission on tomorrow. Pt says weekend was a good one. Enjoyed visitation from her mother    Colbert Ewing 04/29/2013, 10:22 PM

## 2013-04-30 NOTE — Progress Notes (Signed)
Patient ID: Allison Moore, female   DOB: 1981/02/24, 32 y.o.   MRN: [id number] Patient stated that she is discharged today.  Informed her staff would let her know sometime today if indicated.  She remains with disorganized thinking; somewhat irritable.  When asked about her depression, she stated, "I put in on my sheet; you can read it there."  She states that she is "exicited about being able to go home."  [facility].  She rates her depression as a 1.5 today and her hopelessness an 8.  Patient stated that she liked to stay in her room to "feel safe", although she has been attending groups with minimal participation.  She denies any SI/HI/AVH.  Continue to monitor medication management and MD orders.  Safety checks completed every 15 minutes per protocol.  Patient's behavior has been appropriate.

## 2013-04-30 NOTE — Progress Notes (Signed)
Pt. Was much brighter and clearer than last week when admitted to [facility].  She denies SI, HI and A or VH.   Pt. Denied pain and reports that she thinks she is scheduled to discharge tomorrow.  Writer ask pt. Where she would be going when she discharges and she reports that she will be going home but [facility] day Monday through Friday and she will return there also.

## 2013-04-30 NOTE — Progress Notes (Signed)
Recreation Therapy Notes   Date: 07.07.2014 Time: 9:30am Location: [address] Dayroom      Group Topic/Focus: Leisure Education  Participation Level: Active  Participation Quality: Appropriate  Affect: Euthymic  Cognitive: Appropriate   Additional Comments: Activity: Leisure Alphabet ; Explanation: Patients were asked to select a letter of the alphabet from a container and state a leisure/recreation activity that starts with that letter of the alphabet.   Patient actively participated in activity. Patient successfully stated an appropriate leisure/recreation activity to coorespond with the letter he selected. Patient encouraged and assisted peers as needed. Patient shared with group she does not like going to the beach any more. Patient stated that the last time she went to the beach  She sat near the water's edge and she kept "sinking and sinking and I didn't like it." LRT explained why this happened, patient appeared to accept LRT answer by nodding her head in agreement. Patient listened to discussion about the benefits of leisure/recreation.   Allison Moore Allison Moore, LRT/CTRS  [name] L 04/30/2013 1:02 PM

## 2013-04-30 NOTE — [facility] Group Notes (Signed)
Jahne Krukowski [facility] LCSW Aftercare Discharge Planning Group Note   04/30/2013 8:33 AM  Participation Quality:  Engaged  Mood/Affect:  Blunted  Depression Rating:  denies  Anxiety Rating:  denies  Thoughts of Suicide:  No Will you contract for safety?   NA  Current AVH:  Denies, but still guarded with some thought blocking  Plan for Discharge/Comments:  Today [name] is much more spontaneous in her speech than she was last week.  Her mood is good.  She had family visitors over the weekend, and she is hoping to d/c today.  She plans to follow up with [facility] at d/c  After group, we called her mother who confirmed that pt is going in a good direction.  She remains concerned about pt's poor memory since the beginning of this episode, and also that pt periodically decides she does not need medication anymore, and just quits taking it, as she did this time.  Transportation Means: family  Supports:family   [place], [place] B

## 2013-04-30 NOTE — Tx Team (Signed)
  Interdisciplinary Treatment Plan Update   Date Reviewed:  04/30/2013  Time Reviewed:  10:57 AM  Progress in Treatment:   Attending groups: Yes Participating in groups: Yes Taking medication as prescribed: Yes  Tolerating medication: Yes Family/Significant other contact made: Yes  Patient understands diagnosis: Yes  Discussing patient identified problems/goals with staff: Yes Medical problems stabilized or resolved: Yes Denies suicidal/homicidal ideation: Yes Patient has not harmed self or others: Yes  For review of initial/current patient goals, please see plan of care.  Estimated Length of Stay:  1-2 days  Reason for Continuation of Hospitalization: Hallucinations Medication stabilization  New Problems/Goals identified:  N/A  Discharge Plan or Barriers:   return home, follow up with [facility]  Additional Comments:  Allison Moore is exhibiting less thought blocking and decreasing mood lability.  She is responding positively to reintroduction of medication.  Attendees:  Signature: 04/30/2013 10:57 AM   Signature: Richelle Ito, LCSW 04/30/2013 10:57 AM  Signature: Verne Spurr, PA 04/30/2013 10:57 AM  Signature: Joslyn Devon, RN 04/30/2013 10:57 AM  Signature:  04/30/2013 10:57 AM  Signature:  04/30/2013 10:57 AM  Signature:   04/30/2013 10:57 AM  Signature:    Signature:    Signature:    Signature:    Signature:    Signature:      Scribe for Treatment Team:   Richelle Ito, LCSW  04/30/2013 10:57 AM

## 2013-04-30 NOTE — Care Management Utilization Note (Signed)
   Per State Regulation 482.30  This chart was reviewed for necessity with respect to the patient's Admission/ Duration of stay.  Next review date: 05/03/13  Nicolasa Ducking RN, BSN

## 2013-04-30 NOTE — [facility] Group Notes (Signed)
[facility] LCSW Group Therapy  04/30/2013 1:15 pm  Type of Therapy: Process Group Therapy  Participation Level:  Active  Participation Quality:  Appropriate  Affect:  Flat  Cognitive:  Oriented  Insight:  Improving  Engagement in Group:  Limited  Engagement in Therapy:  Limited  Modes of Intervention:  Activity, Clarification, Education, Problem-solving and Support  Summary of Progress/Problems: Today's group addressed the issue of overcoming obstacles.  Patients were asked to identify their biggest obstacle post d/c that stands in the way of their on-going success, and then problem solve as to how to manage this.  Jasslyn tried telling me her biggest obstacle is her anger management because she attended a class once.  I confronted her on the fact that her mother let us know she quit taking her meds several weeks ago because she thought she did not need them anymore.  She admitted that was an issue, which led to an interesting discussion among group members about meds and non-compliance.  Ida Rogue 04/30/2013   4:32 PM

## 2013-04-30 NOTE — Progress Notes (Signed)
The focus of this group is to help patients review their daily goal of treatment and discuss progress on daily workbooks. Pt attended the evening group session and responded to all discussion prompts from the Writer. Pt reported having a good day despite the disappointment of not being discharged this morning as she had originally hoped. Pt did say that she felt she would be discharged tomorrow and that she felt well enough to leave. Pt's affect was flat except when Pt was speaking or being spoken to during which time she smiled bashfully and averted her eyes from making eye contact.

## 2013-04-30 NOTE — Progress Notes (Signed)
Patient ID: Allison Moore, female   DOB: 1981/05/31, 32 y.o.   MRN: [id number] [facility] MD Progress Note  04/30/2013 2:12 PM Allison Moore  MRN:  [id number] Subjective: "I'm fine, and you?" This is the first complete sentence that [name] has used appropriately since she arrived.   Objective: She notes that she is doing well, sleeping well, but has to get up to void. She notes that her depression is better, and she denies SI/HI, but also reports that the auditory voices are decreasing significantly. She is alert and oriented x 3. Diagnosis:  Schizoaffective disorder  ADL's:  Intact  Sleep: Fair  Appetite:  Fair  Suicidal Ideation:  Deny Homicidal Ideation:  Denies AEB (as evidenced by): By patient's report of decreasing symptoms, affect, reports of improved appetite and sleep, behavior and participation in unit programming.  Psychiatric Specialty Exam: Review of Systems  Unable to perform ROS: mental acuity    Blood pressure 130/84, pulse 93, temperature 97.6 F (36.4 C), temperature source Oral, resp. rate 18, height 5\' 4"  (1.626 m), weight 105.688 kg (233 lb), last menstrual period 04/18/2013, SpO2 99.00%.Body mass index is 39.97 kg/(m^2).  General Appearance: Casual  Eye Contact::  Fair  Speech:  Normal Rate  Volume:  Normal  Mood:  Improving and depression is lifting.  Affect:  Congruent  Thought Process:  clearing  Orientation:  3/3  Thought Content:  WDL  Suicidal Thoughts:  No  Homicidal Thoughts:  No  Memory:  NA  Judgement:  Impaired  Insight:  Lacking  Psychomotor Activity:  Normal  Concentration:  Fair  Recall:  Fair  Akathisia:  No  Handed:  Right  AIMS (if indicated):     Assets:  Desire for Improvement Housing Social Support  Sleep:  Number of Hours: 5.25   Current Medications: Current Facility-Administered Medications  Medication Dose Route Frequency Provider Last Rate Last Dose  . alum & mag hydroxide-simeth (MAALOX/MYLANTA) 200-200-20 MG/5ML  suspension 30 mL  30 mL Oral Q4H PRN Kerry Hough, PA-C      . benztropine (COGENTIN) tablet 1 mg  1 mg Oral QHS Mojeed Akintayo   1 mg at 04/29/13 2200  . FLUoxetine (PROZAC) capsule 20 mg  20 mg Oral Daily Mojeed Akintayo   20 mg at 04/30/13 0823  . fluPHENAZine (PROLIXIN) tablet 7.5 mg  7.5 mg Oral QHS Verne Spurr, PA-C   7.5 mg at 04/29/13 2200  . hydrOXYzine (ATARAX/VISTARIL) tablet 25 mg  25 mg Oral Q6H PRN Kerry Hough, PA-C   25 mg at 04/26/13 1818  . ibuprofen (ADVIL,MOTRIN) tablet 600 mg  600 mg Oral Q6H PRN Kerry Hough, PA-C   600 mg at 04/29/13 2202  . magnesium hydroxide (MILK OF MAGNESIA) suspension 30 mL  30 mL Oral Daily PRN Kerry Hough, PA-C      . traZODone (DESYREL) tablet 50 mg  50 mg Oral QHS PRN Mojeed Akintayo   50 mg at 04/29/13 2200    Lab Results: No results found for this or any previous visit (from the past 48 hour(s)).  Physical Findings: AIMS: Facial and Oral Movements Muscles of Facial Expression: None, normal Lips and Perioral Area: None, normal Jaw: None, normal Tongue: None, normal,Extremity Movements Upper (arms, wrists, hands, fingers): None, normal Lower (legs, knees, ankles, toes): None, normal, Trunk Movements Neck, shoulders, hips: None, normal, Overall Severity Severity of abnormal movements (highest score from questions above): None, normal Incapacitation due to abnormal movements: None, normal Patient's awareness of abnormal movements (  rate only patient's report): No Awareness, Dental Status Current problems with teeth and/or dentures?: No Does patient usually wear dentures?: No  CIWA:    COWS:     Treatment Plan Summary: Daily contact with patient to assess and evaluate symptoms and progress in treatment Medication management  Plan: 1. Patient is doing well and anticipate D/C home tomorrow. 2. Lengthy discussion regarding her cannabis abuse and the affect on her thought process and general health.  3. LOS: 1  day.  Medical Decision Making Problem Points:  Established problem, stable/improving (1) Data Points:  Review of medication regiment & side effects (2)  I certify that inpatient services furnished can reasonably be expected to improve the patient's condition.  Rona Ravens. Lieutenant Abarca RPAC 2:12 PM 04/30/2013

## 2013-05-01 MED ORDER — FLUOXETINE HCL 20 MG PO CAPS: 20.0000 mg | ORAL_CAPSULE | Freq: Every day | ORAL | Status: DC

## 2013-05-01 MED ORDER — FLUPHENAZINE HCL 2.5 MG PO TABS: 7.5000 mg | ORAL_TABLET | Freq: Every day | ORAL | Status: DC

## 2013-05-01 MED ORDER — BENZTROPINE MESYLATE 1 MG PO TABS: 1.0000 mg | ORAL_TABLET | Freq: Every day | ORAL | Status: DC

## 2013-05-01 NOTE — Progress Notes (Signed)
Patient ID: Allison Moore, female   DOB: 09-Mar-1981, 32 y.o.   MRN: [id number] Patient discharged home per MD order.  Patient received her personal belongings, medication samples and prescriptions.  Discharge instructions were reviewed with patient and she indicated understanding.  Patient did not have a locker with belongings.  She left ambulatory with her grandmother.  She denied any SI/HI/AVH.

## 2013-05-01 NOTE — Progress Notes (Signed)
[facility] Adult Case Management Discharge Plan :  Will you be returning to the same living situation after discharge: Yes,  home At discharge, do you have transportation home?:Yes,  family Do you have the ability to pay for your medications:Yes,  [facility]  Release of information consent forms completed and in the chart;  Patient's signature needed at discharge.  Patient to Follow up at: Follow-up Information   Follow up with [facility]. (Go to group the day after you are discharged from the hospital.  Dewayne Hatch is expecting you.)    Contact information:   [address]  [place]  [phone]      Follow up with [facility]. (Go to the walk-in clinic M-F between 8 and 9AM for your hospital follow up appointment.  Talk to Dewayne Hatch about what day would be best to miss group and go to [place])    Contact information:   [address]  [place]  [phone]      Patient denies SI/HI:   Yes,  yes    Safety Planning and Suicide Prevention discussed:  Yes,  yes  Ida Rogue 05/01/2013, 10:33 AM

## 2013-05-01 NOTE — Tx Team (Signed)
  Interdisciplinary Treatment Plan Update   Date Reviewed:  05/01/2013  Time Reviewed:  10:33 AM  Progress in Treatment:   Attending groups: Yes Participating in groups: Yes Taking medication as prescribed: Yes  Tolerating medication: Yes Family/Significant other contact made: Yes  Patient understands diagnosis: Yes  Discussing patient identified problems/goals with staff: Yes Medical problems stabilized or resolved: Yes Denies suicidal/homicidal ideation: Yes Patient has not harmed self or others: Yes  For review of initial/current patient goals, please see plan of care.  Estimated Length of Stay:  D/C today  Reason for Continuation of Hospitalization:   New Problems/Goals identified:  N/A  Discharge Plan or Barriers:   return home, follow up outpt  Additional Comments:  Attendees:  Signature:  05/01/2013 10:33 AM   Signature: Richelle Ito, LCSW 05/01/2013 10:33 AM  Signature: Verne Spurr, PA 05/01/2013 10:33 AM  Signature: Joslyn Devon, RN 05/01/2013 10:33 AM  Signature:  05/01/2013 10:33 AM  Signature:  05/01/2013 10:33 AM  Signature:   05/01/2013 10:33 AM  Signature:    Signature:    Signature:    Signature:    Signature:    Signature:      Scribe for Treatment Team:   Richelle Ito, LCSW  05/01/2013 10:33 AM
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
## 2013-05-02 ENCOUNTER — Telehealth: Payer: Self-pay | Admitting: Family Medicine

## 2013-05-02 NOTE — Telephone Encounter (Signed)
Pt is requesting a call  From Dr. Armen Pickup about a letter that we were supposed to mail to her. I couldn't find any reference to what letter she was talking about. JW

## 2013-05-02 NOTE — Telephone Encounter (Signed)
Spoke with pt.  She states that she needs a letter from Dr. Armen Pickup allowing her to go to school.  Pt is very hard to follow on the phone.  Could not clarify what she needed the letter to say and why she needed this letter.  Pt agreeable for appt to followup with MD and discuss the letter. Nazim Kadlec, [name]

## 2013-05-04 NOTE — Progress Notes (Signed)
Patient Discharge Instructions:  After Visit Summary (AVS):   Faxed to:  05/04/13 Psychiatric Admission Assessment Note:   Faxed to:  05/04/13 Suicide Risk Assessment - Discharge Assessment:   Faxed to:  05/04/13 Faxed/Sent to the Next Level Care provider:  05/04/13 Faxed to [facility] @ [phone] Faxed to [facility] @ [phone]  Jerelene Redden, 05/04/2013, 11:49 AM

## 2013-05-08 ENCOUNTER — Ambulatory Visit (INDEPENDENT_AMBULATORY_CARE_PROVIDER_SITE_OTHER): Payer: Medicare Other | Admitting: Family Medicine

## 2013-05-08 ENCOUNTER — Encounter: Payer: Self-pay | Admitting: Family Medicine

## 2013-05-08 VITALS — BP 109/73 | HR 76 | Temp 98.4°F | Ht 64.0 in | Wt 228.0 lb

## 2013-05-08 DIAGNOSIS — N63 Unspecified lump in unspecified breast: Secondary | ICD-10-CM

## 2013-05-08 DIAGNOSIS — R339 Retention of urine, unspecified: Secondary | ICD-10-CM

## 2013-05-08 DIAGNOSIS — Z309 Encounter for contraceptive management, unspecified: Secondary | ICD-10-CM

## 2013-05-08 DIAGNOSIS — R300 Dysuria: Secondary | ICD-10-CM

## 2013-05-08 DIAGNOSIS — F259 Schizoaffective disorder, unspecified: Secondary | ICD-10-CM

## 2013-05-08 LAB — POCT URINALYSIS DIPSTICK
Bilirubin, UA: NEGATIVE
Glucose, UA: NEGATIVE
Leukocytes, UA: NEGATIVE
Nitrite, UA: NEGATIVE
Urobilinogen, UA: 0.2

## 2013-05-08 LAB — POCT UA - MICROSCOPIC ONLY

## 2013-05-08 NOTE — Patient Instructions (Addendum)
Maebelle,  Thank you for coming in today.  1. Birth control: please schedule mirena placement with me.   2. Urinary retention:  your kidney is and has always been normal. You may have a urinary tract infection but I believe that your retention is more likely medication side effect. Please stop cogentin for now ( this medication is used to treat tremor and abnormal body movements that could be caused by Fluphenazine). But urinary retention is a side effect.    Dr. Armen Pickup

## 2013-05-08 NOTE — Progress Notes (Signed)
Agree with assessment and plan Torrence Branagan A. Cozy Veale, M.D. 

## 2013-05-08 NOTE — Progress Notes (Signed)
Agree with assessment and plan Aries Kasa A. Larna Capelle, M.D. 

## 2013-05-08 NOTE — Progress Notes (Signed)
Agree with assessment and plan Nargis Abrams A. Adeeb Konecny, M.D. 

## 2013-05-09 LAB — BASIC METABOLIC PANEL
Calcium: 9.7 mg/dL (ref 8.4–10.5)
Creat: 0.72 mg/dL (ref 0.50–1.10)
Sodium: 136 mEq/L (ref 135–145)

## 2013-05-11 ENCOUNTER — Telehealth: Payer: Self-pay | Admitting: Family Medicine

## 2013-05-11 ENCOUNTER — Encounter: Payer: Self-pay | Admitting: Family Medicine

## 2013-05-11 DIAGNOSIS — Z309 Encounter for contraceptive management, unspecified: Secondary | ICD-10-CM | POA: Insufficient documentation

## 2013-05-11 NOTE — Assessment & Plan Note (Addendum)
A: improved with fluphenazine. However, patient now with urinary retention which is mostly likely due to cogentin. No evidence of UTI.  P:  Continue fluphenazine. Patient encouraged to keep f/u appt at [facility] 05/22/13. Patient will see me in f/u next week. Will have her sign release of medical information form if she agrees so I may send and receive notes from her psychiatrist at [facility].

## 2013-05-11 NOTE — Assessment & Plan Note (Signed)
D/C cogentin. Check creatinine and it was normal.

## 2013-05-11 NOTE — Assessment & Plan Note (Addendum)
A: patient no longer desires pregnancy. We discussed options and she has opted for another IUD.  P: Advised Schedule f/u for IUD placement.

## 2013-05-11 NOTE — Telephone Encounter (Signed)
Called patient left VM. Attempting to confirm that she will have IUD placement on Monday July 21. Please call if this is her intention. Please take ibuprofen ahead of time.

## 2013-05-11 NOTE — Progress Notes (Signed)
Subjective:     Patient ID: Allison Moore, female   DOB: [birth year], 32 y.o.   MRN: [id number]  HPI 32 year old female with a history of multiple sclerosis schizoaffective disorder presents with her mother for followup:  1. Recent hospitalization f/u: Patient hospitalized at [facility] behavioral health for one week (04/24/2013 to 05/01/2013) Due to visual hallucinations, acute worsening depression with suicidal ideation and disorganized thoughts. She is not positive for marijuana on preadmission evaluation.  Patient was started on fluoxetine for major depressive disorder, fluphenazine for psychosis and Cogentin to prevent extrapyramidal symptoms. She discharged with instructions to follow up at [facility] for the day program. She has scheduled followup at [facility] on 05/22/2013 at 2 PM. Today she denies auditory and visual hallucinations. She denies suicidal ideation.  2. Urinary retention: Patient reports 1 month of difficulty with urination. She previously urinated 2-3 times per day. She is urinating once daily. She admits a positive dry mouth. She denies dizziness or lightheadedness.  3. Contraception: Patient IUD was removed and earlier this year and not replaced because she desired pregnancy.  Today with her mother person patient reports that she no longer desires pregnancy. She said that she be unable to care for her child in her current state. Interested in having another IUD placed.   Review of Systems As per history of present illness    Objective:   Physical Exam BP 109/73  Pulse 76  Temp(Src) 98.4 F (36.9 C) (Oral)  Ht 5\' 4"  (1.626 m)  Wt 228 lb (103.42 kg)  BMI 39.12 kg/m2  LMP 04/18/2013 General appearance: alert, cooperative and no distress Abdomen: soft, non-tender; bowel sounds normal; no masses,  no organomegaly Psych: Patient with slowed speech and mentation. Her responses to my questions are appropriate.  Cath UA: Trace ketones. Negative for leukocytes. Moderate  blood cells. 1-3 white blood cells. 1+ bacteria.  Assessment and Plan:

## 2013-05-14 ENCOUNTER — Emergency Department (HOSPITAL_COMMUNITY)
Admission: EM | Admit: 2013-05-14 | Discharge: 2013-05-15 | Disposition: A | Discharge status: Home / Self Care | Payer: Medicare Other | Attending: Emergency Medicine | Admitting: Emergency Medicine

## 2013-05-14 ENCOUNTER — Encounter (HOSPITAL_COMMUNITY): Payer: Self-pay | Admitting: Emergency Medicine

## 2013-05-14 ENCOUNTER — Ambulatory Visit: Payer: Self-pay | Admitting: Family Medicine

## 2013-05-14 DIAGNOSIS — F32A Depression, unspecified: Secondary | ICD-10-CM

## 2013-05-14 DIAGNOSIS — Z79899 Other long term (current) drug therapy: Secondary | ICD-10-CM | POA: Insufficient documentation

## 2013-05-14 DIAGNOSIS — F411 Generalized anxiety disorder: Secondary | ICD-10-CM | POA: Insufficient documentation

## 2013-05-14 DIAGNOSIS — F259 Schizoaffective disorder, unspecified: Secondary | ICD-10-CM

## 2013-05-14 DIAGNOSIS — Z862 Personal history of diseases of the blood and blood-forming organs and certain disorders involving the immune mechanism: Secondary | ICD-10-CM | POA: Insufficient documentation

## 2013-05-14 DIAGNOSIS — Z87440 Personal history of urinary (tract) infections: Secondary | ICD-10-CM | POA: Insufficient documentation

## 2013-05-14 DIAGNOSIS — Z3202 Encounter for pregnancy test, result negative: Secondary | ICD-10-CM | POA: Insufficient documentation

## 2013-05-14 DIAGNOSIS — Z8742 Personal history of other diseases of the female genital tract: Secondary | ICD-10-CM | POA: Insufficient documentation

## 2013-05-14 DIAGNOSIS — R339 Retention of urine, unspecified: Secondary | ICD-10-CM | POA: Insufficient documentation

## 2013-05-14 DIAGNOSIS — Z8781 Personal history of (healed) traumatic fracture: Secondary | ICD-10-CM | POA: Insufficient documentation

## 2013-05-14 DIAGNOSIS — F3289 Other specified depressive episodes: Secondary | ICD-10-CM | POA: Insufficient documentation

## 2013-05-14 DIAGNOSIS — F329 Major depressive disorder, single episode, unspecified: Secondary | ICD-10-CM | POA: Insufficient documentation

## 2013-05-14 DIAGNOSIS — Z8669 Personal history of other diseases of the nervous system and sense organs: Secondary | ICD-10-CM | POA: Insufficient documentation

## 2013-05-14 DIAGNOSIS — Z8619 Personal history of other infectious and parasitic diseases: Secondary | ICD-10-CM | POA: Insufficient documentation

## 2013-05-14 DIAGNOSIS — R52 Pain, unspecified: Secondary | ICD-10-CM | POA: Insufficient documentation

## 2013-05-14 LAB — COMPREHENSIVE METABOLIC PANEL
AST: 18 U/L (ref 0–37)
Albumin: 3.9 g/dL (ref 3.5–5.2)
Alkaline Phosphatase: 61 U/L (ref 39–117)
BUN: 9 mg/dL (ref 6–23)
Chloride: 103 mEq/L (ref 96–112)
Creatinine, Ser: 0.73 mg/dL (ref 0.50–1.10)
Potassium: 3.8 mEq/L (ref 3.5–5.1)
Total Bilirubin: 0.2 mg/dL — ABNORMAL LOW (ref 0.3–1.2)
Total Protein: 7 g/dL (ref 6.0–8.3)

## 2013-05-14 LAB — CBC
HCT: 39.1 % (ref 36.0–46.0)
Hemoglobin: 13.3 g/dL (ref 12.0–15.0)
MCHC: 34 g/dL (ref 30.0–36.0)
MCV: 87.9 fL (ref 78.0–100.0)
RDW: 12.6 % (ref 11.5–15.5)

## 2013-05-14 LAB — URINALYSIS, ROUTINE W REFLEX MICROSCOPIC
Ketones, ur: NEGATIVE mg/dL
Specific Gravity, Urine: 1.022 (ref 1.005–1.030)
pH: 6 (ref 5.0–8.0)

## 2013-05-14 LAB — ETHANOL: Alcohol, Ethyl (B): <11 mg/dL (ref 0–11)

## 2013-05-14 LAB — URINE MICROSCOPIC-ADD ON

## 2013-05-14 LAB — RAPID URINE DRUG SCREEN, HOSP PERFORMED
Amphetamines: NOT DETECTED
Benzodiazepines: NOT DETECTED
Cocaine: NOT DETECTED

## 2013-05-14 LAB — ACETAMINOPHEN LEVEL: Acetaminophen (Tylenol), Serum: <15 ug/mL (ref 10–30)

## 2013-05-14 LAB — SALICYLATE LEVEL: Salicylate Lvl: <2 mg/dL — ABNORMAL LOW (ref 2.8–20.0)

## 2013-05-14 LAB — POCT PREGNANCY, URINE: Preg Test, Ur: NEGATIVE

## 2013-05-14 MED ORDER — IBUPROFEN 400 MG PO TABS: 600.0000 mg | ORAL_TABLET | Freq: Three times a day (TID) | ORAL | Status: DC | PRN

## 2013-05-14 MED ORDER — ZOLPIDEM TARTRATE 5 MG PO TABS: 5.0000 mg | ORAL_TABLET | Freq: Every evening | ORAL | Status: DC | PRN

## 2013-05-14 MED ORDER — ALUM & MAG HYDROXIDE-SIMETH 200-200-20 MG/5ML PO SUSP: 30.0000 mL | ORAL | Status: DC | PRN

## 2013-05-14 MED ORDER — ONDANSETRON HCL 4 MG PO TABS: 4.0000 mg | ORAL_TABLET | Freq: Three times a day (TID) | ORAL | Status: DC | PRN
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
## 2013-05-14 NOTE — ED Notes (Signed)
Pt here with grandmother c/o generalized body pain; pt sts thinks could be from MS; per family pt not acting self and may need medical clearance; pt seen here for same recently; pt yawning at present

## 2013-05-15 NOTE — ED Provider Notes (Signed)
Medical screening examination/treatment/procedure(s) were performed by non-physician practitioner and as supervising physician I was immediately available for consultation/collaboration.   Carleene Cooper III, MD 05/15/13 [phone]

## 2013-05-15 NOTE — ED Notes (Signed)
telepsych monitor set up in room
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
## 2013-05-15 NOTE — ED Provider Notes (Signed)
Nursing notes and vitals signs, including pulse oximetry, reviewed.  Summary of this visit's results, reviewed by myself:  Labs:  Results for orders placed during the hospital encounter of 05/14/13 (from the past 24 hour(s))  ACETAMINOPHEN LEVEL     Status: None   Collection Time    05/14/13  5:46 PM      Result Value Range   Acetaminophen (Tylenol), Serum <15.0  10 - 30 ug/mL  CBC     Status: None   Collection Time    05/14/13  5:46 PM      Result Value Range   WBC 4.7  4.0 - 10.5 K/uL   RBC 4.45  3.87 - 5.11 MIL/uL   Hemoglobin 13.3  12.0 - 15.0 g/dL   HCT 16.1  09.6 - 04.5 %   MCV 87.9  78.0 - 100.0 fL   MCH 29.9  26.0 - 34.0 pg   MCHC 34.0  30.0 - 36.0 g/dL   RDW 40.9  81.1 - 91.4 %   Platelets 319  150 - 400 K/uL  COMPREHENSIVE METABOLIC PANEL     Status: Abnormal   Collection Time    05/14/13  5:46 PM      Result Value Range   Sodium 139  135 - 145 mEq/L   Potassium 3.8  3.5 - 5.1 mEq/L   Chloride 103  96 - 112 mEq/L   CO2 28  19 - 32 mEq/L   Glucose, Bld 101 (*) 70 - 99 mg/dL   BUN 9  6 - 23 mg/dL   Creatinine, Ser 7.82  0.50 - 1.10 mg/dL   Calcium 9.9  8.4 - 95.6 mg/dL   Total Protein 7.0  6.0 - 8.3 g/dL   Albumin 3.9  3.5 - 5.2 g/dL   AST 18  0 - 37 U/L   ALT 29  0 - 35 U/L   Alkaline Phosphatase 61  39 - 117 U/L   Total Bilirubin 0.2 (*) 0.3 - 1.2 mg/dL   GFR calc non Af Amer >90  >90 mL/min   GFR calc Af Amer >90  >90 mL/min  ETHANOL     Status: None   Collection Time    05/14/13  5:46 PM      Result Value Range   Alcohol, Ethyl (B) <11  0 - 11 mg/dL  SALICYLATE LEVEL     Status: Abnormal   Collection Time    05/14/13  5:46 PM      Result Value Range   Salicylate Lvl <2.0 (*) 2.8 - 20.0 mg/dL  URINE RAPID DRUG SCREEN (HOSP PERFORMED)     Status: Abnormal   Collection Time    05/14/13  8:48 PM      Result Value Range   Opiates NONE DETECTED  NONE DETECTED   Cocaine NONE DETECTED  NONE DETECTED   Benzodiazepines NONE DETECTED  NONE DETECTED   Amphetamines NONE DETECTED  NONE DETECTED   Tetrahydrocannabinol POSITIVE (*) NONE DETECTED   Barbiturates NONE DETECTED  NONE DETECTED  URINALYSIS, ROUTINE W REFLEX MICROSCOPIC     Status: Abnormal   Collection Time    05/14/13  8:48 PM      Result Value Range   Color, Urine YELLOW  YELLOW   APPearance HAZY (*) CLEAR   Specific Gravity, Urine 1.022  1.005 - 1.030   pH 6.0  5.0 - 8.0   Glucose, UA NEGATIVE  NEGATIVE mg/dL   Hgb urine dipstick LARGE (*) NEGATIVE   Bilirubin  Urine SMALL (*) NEGATIVE   Ketones, ur NEGATIVE  NEGATIVE mg/dL   Protein, ur NEGATIVE  NEGATIVE mg/dL   Urobilinogen, UA 1.0  0.0 - 1.0 mg/dL   Nitrite NEGATIVE  NEGATIVE   Leukocytes, UA TRACE (*) NEGATIVE  URINE MICROSCOPIC-ADD ON     Status: Abnormal   Collection Time    05/14/13  8:48 PM      Result Value Range   Squamous Epithelial / LPF FEW (*) RARE   WBC, UA 3-6  <3 WBC/hpf   RBC / HPF 3-6  <3 RBC/hpf   Bacteria, UA FEW (*) RARE   Urine-Other MUCOUS PRESENT    POCT PREGNANCY, URINE     Status: None   Collection Time    05/14/13  8:57 PM      Result Value Range   Preg Test, Ur NEGATIVE  NEGATIVE   4:08 AM Dr. Jacky Kindle (telepsychiatry) deems patient safe for discharge.    Hanley Seamen, MD 05/15/13 [phone]

## 2013-05-16 LAB — URINE CULTURE

## 2013-05-17 ENCOUNTER — Encounter: Payer: Self-pay | Admitting: Family Medicine

## 2013-05-17 ENCOUNTER — Ambulatory Visit (INDEPENDENT_AMBULATORY_CARE_PROVIDER_SITE_OTHER): Payer: Medicare Other | Admitting: Family Medicine

## 2013-05-17 VITALS — BP 118/75 | HR 87 | Temp 98.1°F | Wt 225.0 lb

## 2013-05-17 DIAGNOSIS — Z309 Encounter for contraceptive management, unspecified: Secondary | ICD-10-CM

## 2013-05-17 DIAGNOSIS — Z3049 Encounter for surveillance of other contraceptives: Secondary | ICD-10-CM

## 2013-05-17 DIAGNOSIS — R339 Retention of urine, unspecified: Secondary | ICD-10-CM

## 2013-05-17 DIAGNOSIS — G35 Multiple sclerosis: Secondary | ICD-10-CM

## 2013-05-17 LAB — POCT UA - MICROSCOPIC ONLY: RBC, urine, microscopic: >20

## 2013-05-17 LAB — POCT URINALYSIS DIPSTICK
Glucose, UA: NEGATIVE
Leukocytes, UA: NEGATIVE
Spec Grav, UA: >=1.03

## 2013-05-17 MED ORDER — GLATIRAMER ACETATE 20 MG/ML ~~LOC~~ KIT: 20.0000 mg | PACK | Freq: Every day | SUBCUTANEOUS | Status: DC

## 2013-05-17 NOTE — Patient Instructions (Addendum)
Majesty,  Thank you for coming in today.  1. MS: Please restart capoxone. I sent the prescription.  Also call [facility] bill department to discuss your balance. From what I gather you need to come up with a payment plan in order to schedule f/u.  The # is [phone]  2. Bipolar depression/Schizoaffective disorder: continue fluoxetine and fluphenazine. Keep F/u appt at [facility].  Your labs were done in the ED 3 days ago and normal.  F/u with me in 1 week.  Dr. Armen Pickup

## 2013-05-21 ENCOUNTER — Telehealth: Payer: Self-pay | Admitting: *Deleted

## 2013-05-21 NOTE — Telephone Encounter (Signed)
Erroneous encounter.  Devonn Giampietro, Darlyne Russian, CMA

## 2013-05-22 NOTE — Assessment & Plan Note (Addendum)
A: declined off treatment. Patient appears to be having an acute MS flare was difficult to tell with her baseline mood disorder. I attempted to set the patient up a followup visit at per neurology was unable to do so due to her outstanding balance.  P: Please restart capoxone. I sent the prescription.  Also call [facility] bill department to discuss your balance. From what I gather you need to come up with a payment plan in order to schedule f/u.  The # is [phone]

## 2013-05-22 NOTE — Assessment & Plan Note (Signed)
There is no urinary retention. Patient is voiding. She is not taking cogentin. Normal UA. Normal Cr.

## 2013-05-22 NOTE — Assessment & Plan Note (Signed)
I will place an IUD in next followup. Unable to do so at this visit due to more pressing issue of multiple sclerosis.
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
## 2013-05-23 ENCOUNTER — Telehealth: Payer: Self-pay | Admitting: *Deleted

## 2013-05-23 NOTE — Telephone Encounter (Signed)
Kathlene November at [facility] pharmacy calling to request new Rx for Copaxone syringes [phone] & press #3).  Will route refill request to Dr. Armen Pickup.   Gaylene Brooks, RN

## 2013-05-25 ENCOUNTER — Telehealth: Payer: Self-pay | Admitting: Family Medicine

## 2013-05-25 ENCOUNTER — Encounter: Payer: Self-pay | Admitting: Family Medicine

## 2013-05-25 ENCOUNTER — Ambulatory Visit (INDEPENDENT_AMBULATORY_CARE_PROVIDER_SITE_OTHER): Payer: Medicare Other | Admitting: Family Medicine

## 2013-05-25 VITALS — BP 124/62 | HR 78 | Temp 98.6°F | Ht 64.0 in | Wt 226.0 lb

## 2013-05-25 DIAGNOSIS — B9689 Other specified bacterial agents as the cause of diseases classified elsewhere: Secondary | ICD-10-CM | POA: Insufficient documentation

## 2013-05-25 DIAGNOSIS — R300 Dysuria: Secondary | ICD-10-CM

## 2013-05-25 DIAGNOSIS — N898 Other specified noninflammatory disorders of vagina: Secondary | ICD-10-CM

## 2013-05-25 DIAGNOSIS — Z975 Presence of (intrauterine) contraceptive device: Secondary | ICD-10-CM

## 2013-05-25 DIAGNOSIS — A499 Bacterial infection, unspecified: Secondary | ICD-10-CM

## 2013-05-25 DIAGNOSIS — N760 Acute vaginitis: Secondary | ICD-10-CM

## 2013-05-25 DIAGNOSIS — Z309 Encounter for contraceptive management, unspecified: Secondary | ICD-10-CM

## 2013-05-25 LAB — POCT WET PREP (WET MOUNT)

## 2013-05-25 LAB — POCT UA - MICROSCOPIC ONLY

## 2013-05-25 LAB — POCT URINALYSIS DIPSTICK
Ketones, UA: NEGATIVE
Protein, UA: NEGATIVE
Spec Grav, UA: 1.025

## 2013-05-25 MED ORDER — METRONIDAZOLE 500 MG PO TABS: 500.0000 mg | ORAL_TABLET | Freq: Two times a day (BID) | ORAL | Status: DC

## 2013-05-25 MED ORDER — FLUCONAZOLE 150 MG PO TABS: 150.0000 mg | ORAL_TABLET | Freq: Once | ORAL | Status: DC

## 2013-05-25 NOTE — Assessment & Plan Note (Signed)
BV on wet prep.  Will treat with Flagyl followed by Diflucan per orders

## 2013-05-25 NOTE — Assessment & Plan Note (Signed)
Unable to visualize the cervix due to redundant vaginal skin folds and copious vaginal discharge. Vaginal discharge is that her vaginosis.  Plan is to treat bacterial vaginosis. Have referred the patient to GYN for placement of IUD.

## 2013-05-25 NOTE — Progress Notes (Signed)
Subjective:     Patient ID: Allison Moore, female   DOB: 08-13-1981, 32 y.o.   MRN: [id number]  HPI 32 year old female presents for followup for IUD placement. She had her eyeglass IUD removed she did not have one placed because she desired pregnancy. Since then she has had a lot of challenges with her mood disorders and her MS and no longer desires pregnancy.    Review of Systems As per HPI     Objective:   Physical Exam BP 124/62  Pulse 78  Temp(Src) 98.6 F (37 C) (Oral)  Ht 5\' 4"  (1.626 m)  Wt 226 lb (102.513 kg)  BMI 38.77 kg/m2 General appearance: alert, cooperative and no distress Pelvic: exam obscured by obesity and external genitalia normal Speculum exam: I visualized copious white homogenous vaginal discharge with mild odor. After multiple attempts I was unable to visualize the cervix due to redundant vaginal folds.   Your pregnancy test obtained today is negative. Wet prep done: Positive for BV. Patient consented for IUD placement.    Assessment and Plan:

## 2013-05-25 NOTE — Telephone Encounter (Signed)
Cough patient she was napping I spoke to her mother who accompanied her to her office visit today.  Bacterial vaginosis on wet prep. To treat take Flagyl one pill twice a day for one week. Followup with Flagyl with one pill of Diflucan.

## 2013-05-25 NOTE — Telephone Encounter (Signed)
Call back to the [facility] and gave a verbal Reback for the Copaxone syringes as prescribed. Also provided patient's drug allergies and intolerances.

## 2013-05-25 NOTE — Patient Instructions (Addendum)
Allison Moore,  Thank you for coming in today. I have placed a referral to gyn for IUD placement.   I will call with wet prep  results.  I request your records from [facility]  in order to keep myself and the loop regarding her mental health care.  Dr. Armen Pickup

## 2013-06-08 ENCOUNTER — Telehealth: Payer: Self-pay | Admitting: Family Medicine

## 2013-06-08 NOTE — Telephone Encounter (Signed)
In[facility].  Let her know that they will call her to make an appt.  She is fine with this.  Jazmin Hartsell,CMA

## 2013-06-08 NOTE — Telephone Encounter (Signed)
Pt wants to know where was she referred to have IUD removed. Please advise

## 2013-06-28 ENCOUNTER — Emergency Department (HOSPITAL_COMMUNITY)
Admission: EM | Admit: 2013-06-28 | Discharge: 2013-06-28 | Disposition: A | Discharge status: Home / Self Care | Payer: Medicare Other | Attending: Emergency Medicine | Admitting: Emergency Medicine

## 2013-06-28 ENCOUNTER — Encounter (HOSPITAL_COMMUNITY): Payer: Self-pay | Admitting: Emergency Medicine

## 2013-06-28 DIAGNOSIS — Z87440 Personal history of urinary (tract) infections: Secondary | ICD-10-CM | POA: Insufficient documentation

## 2013-06-28 DIAGNOSIS — R300 Dysuria: Secondary | ICD-10-CM | POA: Insufficient documentation

## 2013-06-28 DIAGNOSIS — Z8781 Personal history of (healed) traumatic fracture: Secondary | ICD-10-CM | POA: Insufficient documentation

## 2013-06-28 DIAGNOSIS — Z792 Long term (current) use of antibiotics: Secondary | ICD-10-CM | POA: Insufficient documentation

## 2013-06-28 DIAGNOSIS — Z8742 Personal history of other diseases of the female genital tract: Secondary | ICD-10-CM | POA: Insufficient documentation

## 2013-06-28 DIAGNOSIS — Z3202 Encounter for pregnancy test, result negative: Secondary | ICD-10-CM | POA: Insufficient documentation

## 2013-06-28 DIAGNOSIS — F411 Generalized anxiety disorder: Secondary | ICD-10-CM | POA: Insufficient documentation

## 2013-06-28 DIAGNOSIS — R110 Nausea: Secondary | ICD-10-CM | POA: Insufficient documentation

## 2013-06-28 DIAGNOSIS — Z87891 Personal history of nicotine dependence: Secondary | ICD-10-CM | POA: Insufficient documentation

## 2013-06-28 DIAGNOSIS — F259 Schizoaffective disorder, unspecified: Secondary | ICD-10-CM | POA: Insufficient documentation

## 2013-06-28 DIAGNOSIS — R109 Unspecified abdominal pain: Secondary | ICD-10-CM | POA: Insufficient documentation

## 2013-06-28 DIAGNOSIS — F329 Major depressive disorder, single episode, unspecified: Secondary | ICD-10-CM | POA: Insufficient documentation

## 2013-06-28 DIAGNOSIS — F3289 Other specified depressive episodes: Secondary | ICD-10-CM | POA: Insufficient documentation

## 2013-06-28 DIAGNOSIS — Z87828 Personal history of other (healed) physical injury and trauma: Secondary | ICD-10-CM | POA: Insufficient documentation

## 2013-06-28 DIAGNOSIS — R350 Frequency of micturition: Secondary | ICD-10-CM | POA: Insufficient documentation

## 2013-06-28 DIAGNOSIS — Z79899 Other long term (current) drug therapy: Secondary | ICD-10-CM | POA: Insufficient documentation

## 2013-06-28 DIAGNOSIS — Z862 Personal history of diseases of the blood and blood-forming organs and certain disorders involving the immune mechanism: Secondary | ICD-10-CM | POA: Insufficient documentation

## 2013-06-28 DIAGNOSIS — G35 Multiple sclerosis: Secondary | ICD-10-CM | POA: Insufficient documentation

## 2013-06-28 LAB — URINALYSIS, ROUTINE W REFLEX MICROSCOPIC
Bilirubin Urine: NEGATIVE
Glucose, UA: NEGATIVE mg/dL
Specific Gravity, Urine: 1.029 (ref 1.005–1.030)
pH: 6.5 (ref 5.0–8.0)

## 2013-06-28 LAB — POCT PREGNANCY, URINE: Preg Test, Ur: NEGATIVE

## 2013-06-28 LAB — URINE MICROSCOPIC-ADD ON

## 2013-06-28 NOTE — ED Provider Notes (Signed)
Medical screening examination/treatment/procedure(s) were performed by non-physician practitioner and as supervising physician I was immediately available for consultation/collaboration.  Raeford Razor, MD 06/28/13 [phone]
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
## 2013-06-28 NOTE — ED Notes (Signed)
Pt BIB EMS. Pt states she has had nausea for the past 2 hrs. Pt also states she has been to go to the BR since this morning. Pt is from [facility].

## 2013-06-28 NOTE — ED Notes (Signed)
Pt states she has not been able to void since going this morning. Pt states she has nausea but has not vomited. Pt also c/o epigastric pain since after eating lunch this afternoon. Pt states she feels bloated. Pt a/o x 4. Skin warm, dry. No acute distress. Pt arrives in wheelchair to exam room.

## 2013-07-30 ENCOUNTER — Encounter: Payer: Self-pay | Admitting: Obstetrics & Gynecology

## 2013-07-30 ENCOUNTER — Ambulatory Visit (INDEPENDENT_AMBULATORY_CARE_PROVIDER_SITE_OTHER): Payer: Medicare Other | Admitting: Obstetrics & Gynecology

## 2013-07-30 VITALS — BP 102/67 | HR 82 | Ht 64.0 in | Wt 223.5 lb

## 2013-07-30 DIAGNOSIS — Z975 Presence of (intrauterine) contraceptive device: Secondary | ICD-10-CM

## 2013-07-30 DIAGNOSIS — Z30430 Encounter for insertion of intrauterine contraceptive device: Secondary | ICD-10-CM

## 2013-07-30 LAB — POCT PREGNANCY, URINE: Preg Test, Ur: NEGATIVE

## 2013-07-30 MED ORDER — LEVONORGESTREL 20 MCG/24HR IU IUD
INTRAUTERINE_SYSTEM | Freq: Once | INTRAUTERINE | Status: AC
  Administered 2013-07-30: 15:00:00 1 via INTRAUTERINE

## 2013-07-30 NOTE — Patient Instructions (Signed)
Intrauterine Device Insertion Care After Refer to this sheet in the next few weeks. These instructions provide you with information on caring for yourself after your procedure. Your caregiver may also give you more specific instructions. Your treatment has been planned according to current medical practices, but problems sometimes occur. Call your caregiver if you have any problems or questions after your procedure. HOME CARE INSTRUCTIONS   Only take over-the-counter or prescription medicines for pain, discomfort, or fever as directed by your caregiver. Do not use aspirin. This may increase bleeding.  Check your IUD to make sure it is in place before you resume sexual activity. You should be able to feel the strings. If you cannot feel the strings, something may be wrong. The IUD may have fallen out of the uterus, or the uterus may have been punctured (perforated) during placement. Also, if the strings are getting longer, it may mean that the IUD is being forced out of the uterus. You no longer have full protection from pregnancy if any of these problems occur.  You may resume sexual intercourse if you are not having problems with the IUD. The IUD is considered immediately effective.  You may resume normal activities.  Keep all follow-up appointments to be sure your IUD has remained in place. After the first exam, yearly exams are advised, unless you cannot feel the strings of your IUD.  Continue to check that the IUD is still in place by feeling for the strings after every menstrual period. SEEK MEDICAL CARE IF:   You have bleeding that is heavier or lasts longer than a normal menstrual cycle.  You have a fever.  You have increasing cramps or abdominal pain not relieved with medicine.  You have abdominal pain that does not seem to be related to the same area of earlier cramping and pain.  You are lightheaded, unusually weak, or faint.  You have abnormal vaginal discharge or  smells.  You have pain during sexual intercourse.  You cannot feel the IUD strings, or the IUD string has gotten longer.  You feel the IUD at the opening of the cervix in the vagina.  You think you are pregnant, or you miss your menstrual period.  The IUD string is hurting your sex partner. Document Released: 06/09/2011 Document Revised: 01/03/2012 Document Reviewed: 06/09/2011 Exit[employer] Patient Information 2014 [employer].  

## 2013-07-30 NOTE — Progress Notes (Signed)
  Subjective:    Patient ID: Allison Moore, female    DOB: [birth year], 32 y.o.   MRN: [id number]  HPI  32 yo S AA P1 (69 yo son) is here today for her second Mirena. She is currently not using contraception and would like to return to [place] which she kept in for 5 years in the past.  Review of Systems     Objective:   Physical Exam   UPT negative, consent signed, Time out procedure done. Cervix prepped with betadine and grasped with a single tooth tenaculum. Mirena was easily placed and the strings were cut to 3-4 cm. Uterus sounded to 9 cm. She tolerated the procedure well.      Assessment & Plan:   Contraception- Mirena She will get her pap smear with Dr. Armen Pickup in December.

## 2013-08-06 ENCOUNTER — Telehealth: Payer: Self-pay | Admitting: Family Medicine

## 2013-08-06 NOTE — Telephone Encounter (Signed)
Patient would like to know when she is due for a pap. Please inform patient. [phone]

## 2013-08-07 NOTE — Telephone Encounter (Signed)
Left message for pt on identified VM to call back and schedule her pap.  She is due in December, last one was 09/2010.  Simrat Kendrick,CMA

## 2013-08-15 ENCOUNTER — Encounter: Payer: Self-pay | Admitting: *Deleted

## 2013-08-29 ENCOUNTER — Telehealth: Payer: Self-pay | Admitting: *Deleted

## 2013-09-03 ENCOUNTER — Ambulatory Visit: Payer: Self-pay | Admitting: Obstetrics & Gynecology

## 2013-09-03 NOTE — Telephone Encounter (Signed)
Appt set up for pt 09-19-13 0900 with CMartinNP/ Dohmeier. This was made 08-30-13.

## 2013-09-12 ENCOUNTER — Other Ambulatory Visit: Payer: Self-pay | Admitting: Family Medicine

## 2013-09-12 DIAGNOSIS — G35 Multiple sclerosis: Secondary | ICD-10-CM

## 2013-09-14 NOTE — Telephone Encounter (Signed)
Called patient at home. Left VM regarding copaxone.  Called patient at cell. Not available   Questions for patient: Where is she with getting back into [facility] neurology. From our last OV regarding MS she had an outstanding balance at [facility] prohibiting her from making a f/u appt with [facility].  I asked her to call the billing dept to address this.  This affects her copaxone in that it is a med for her MS. A med best managed by a neurologist. I refilled it to bridge her to her neurology f/u bc she was developing more symptoms of MS.  I will not continue to refill or manage this medication.  Also med is requiring PA bc it is not   Asked patient to call back with update regarding her ability to f/u with [facility].   Called [employer] to get patients copaxone approved until September 14, 2014.  PA #: [id number] Her [employer] member ID # is [id number]  Sent in refill for copaxone x one more month. Patient will need to contact me regarding neurology f/u for additional refills.

## 2013-09-19 ENCOUNTER — Telehealth: Payer: Self-pay | Admitting: *Deleted

## 2013-09-19 ENCOUNTER — Encounter: Payer: Self-pay | Admitting: Nurse Practitioner

## 2013-09-19 ENCOUNTER — Encounter (INDEPENDENT_AMBULATORY_CARE_PROVIDER_SITE_OTHER): Payer: Self-pay

## 2013-09-19 ENCOUNTER — Ambulatory Visit (INDEPENDENT_AMBULATORY_CARE_PROVIDER_SITE_OTHER): Payer: Medicare Other | Admitting: Nurse Practitioner

## 2013-09-19 VITALS — BP 95/63 | HR 65 | Ht 67.0 in | Wt 231.0 lb

## 2013-09-19 DIAGNOSIS — G35 Multiple sclerosis: Secondary | ICD-10-CM

## 2013-09-19 DIAGNOSIS — R51 Headache: Secondary | ICD-10-CM

## 2013-09-19 DIAGNOSIS — R519 Headache, unspecified: Secondary | ICD-10-CM | POA: Insufficient documentation

## 2013-09-19 DIAGNOSIS — R269 Unspecified abnormalities of gait and mobility: Secondary | ICD-10-CM | POA: Insufficient documentation

## 2013-09-19 MED ORDER — CYCLOBENZAPRINE HCL 10 MG PO TABS: ORAL_TABLET | ORAL | Status: DC

## 2013-09-19 NOTE — Telephone Encounter (Signed)
Shared answer to patient's question per Ms Daphine Deutscher thru VM message.

## 2013-09-19 NOTE — Telephone Encounter (Signed)
YES
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
## 2013-09-19 NOTE — Patient Instructions (Signed)
Will switch to Copaxone 40mg  3 times weekly  Samples given for 1 month while insurance is verified F/U in 6 months

## 2013-09-24 ENCOUNTER — Telehealth: Payer: Self-pay | Admitting: Nurse Practitioner

## 2013-09-25 NOTE — Telephone Encounter (Signed)
Please advise 

## 2013-09-26 NOTE — Telephone Encounter (Signed)
This patient was just seen the last of November and did not convey this information in her ROS. I see that she has had urinary retention in the past due to a medication that psych was using for her bipolar disorder. Can you find out more specifics please. We also have her on disability in the record.

## 2013-09-27 NOTE — Telephone Encounter (Signed)
Left message for patient to return call with further details.

## 2013-09-28 ENCOUNTER — Encounter: Payer: Self-pay | Admitting: Obstetrics & Gynecology

## 2013-09-28 ENCOUNTER — Ambulatory Visit (INDEPENDENT_AMBULATORY_CARE_PROVIDER_SITE_OTHER): Payer: Medicare Other | Admitting: Obstetrics & Gynecology

## 2013-09-28 VITALS — BP 101/65 | HR 71 | Temp 97.6°F | Ht 64.0 in | Wt 230.0 lb

## 2013-09-28 DIAGNOSIS — Z30431 Encounter for routine checking of intrauterine contraceptive device: Secondary | ICD-10-CM

## 2013-09-28 NOTE — Progress Notes (Signed)
Patient ID: Allison Moore, female   DOB: Jan 08, 1981, 32 y.o.   MRN: [id number] History:  32 y.o. G2P1011 here today for today for IUD string check; Mirena IUD was placed  07/2013. No complaints about the Mirena, no concerning side effects.  The following portions of the patient's history were reviewed and updated as appropriate: allergies, current medications, past family history, past medical history, past social history, past surgical history and problem list. Scheduled for f/u PAP 10/04/2013 at the [facility] Medicine clinic.  Review of Systems:  Pertinent items are noted in HPI.  Objective:  Physical Exam Blood pressure 101/65, pulse 71, temperature 97.6 F (36.4 C), temperature source Oral, height 5\' 4"  (1.626 m), weight 230 lb (104.327 kg), last menstrual period 09/05/2013. Gen: NAD Abd: Soft, nontender and nondistended Pelvic: Normal appearing external genitalia; normal appearing vaginal mucosa and cervix.  IUD strings visualized, about 3 cm in length outside cervix.   Assessment & Plan:  Normal IUD check. Patient to keep IUD in place for five years; can come in for removal if she desires pregnancy within the next five years. Routine preventative health maintenance measures emphasized.

## 2013-09-28 NOTE — Patient Instructions (Signed)
Levonorgestrel intrauterine device (IUD) What is this medicine? LEVONORGESTREL IUD (LEE voe nor jes trel) is a contraceptive (birth control) device. The device is placed inside the uterus by a healthcare professional. It is used to prevent pregnancy and can also be used to treat heavy bleeding that occurs during your period. Depending on the device, it can be used for 3 to 5 years. This medicine may be used for other purposes; ask your health care provider or pharmacist if you have questions. COMMON BRAND NAME(S): Mirena, Skyla What should I tell my health care provider before I take this medicine? They need to know if you have any of these conditions: -abnormal Pap smear -cancer of the breast, uterus, or cervix -diabetes -endometritis -genital or pelvic infection now or in the past -have more than one sexual partner or your partner has more than one partner -heart disease -history of an ectopic or tubal pregnancy -immune system problems -IUD in place -liver disease or tumor -problems with blood clots or take blood-thinners -use intravenous drugs -uterus of unusual shape -vaginal bleeding that has not been explained -an unusual or allergic reaction to levonorgestrel, other hormones, silicone, or polyethylene, medicines, foods, dyes, or preservatives -pregnant or trying to get pregnant -breast-feeding How should I use this medicine? This device is placed inside the uterus by a health care professional. Talk to your pediatrician regarding the use of this medicine in children. Special care may be needed. Overdosage: If you think you have taken too much of this medicine contact a poison control center or emergency room at once. NOTE: This medicine is only for you. Do not share this medicine with others. What if I miss a dose? This does not apply. What may interact with this medicine? Do not take this medicine with any of the following  medications: -amprenavir -bosentan -fosamprenavir This medicine may also interact with the following medications: -aprepitant -barbiturate medicines for inducing sleep or treating seizures -bexarotene -griseofulvin -medicines to treat seizures like carbamazepine, ethotoin, felbamate, oxcarbazepine, phenytoin, topiramate -modafinil -pioglitazone -rifabutin -rifampin -rifapentine -some medicines to treat HIV infection like atazanavir, indinavir, lopinavir, nelfinavir, tipranavir, ritonavir -St. John's wort -warfarin This list may not describe all possible interactions. Give your health care provider a list of all the medicines, herbs, non-prescription drugs, or dietary supplements you use. Also tell them if you smoke, drink alcohol, or use illegal drugs. Some items may interact with your medicine. What should I watch for while using this medicine? Visit your doctor or health care professional for regular check ups. See your doctor if you or your partner has sexual contact with others, becomes HIV positive, or gets a sexual transmitted disease. This product does not protect you against HIV infection (AIDS) or other sexually transmitted diseases. You can check the placement of the IUD yourself by reaching up to the top of your vagina with clean fingers to feel the threads. Do not pull on the threads. It is a good habit to check placement after each menstrual period. Call your doctor right away if you feel more of the IUD than just the threads or if you cannot feel the threads at all. The IUD may come out by itself. You may become pregnant if the device comes out. If you notice that the IUD has come out use a backup birth control method like condoms and call your health care provider. Using tampons will not change the position of the IUD and are okay to use during your period. What side effects may I   notice from receiving this medicine? Side effects that you should report to your doctor or  health care professional as soon as possible: -allergic reactions like skin rash, itching or hives, swelling of the face, lips, or tongue -fever, flu-like symptoms -genital sores -high blood pressure -no menstrual period for 6 weeks during use -pain, swelling, warmth in the leg -pelvic pain or tenderness -severe or sudden headache -signs of pregnancy -stomach cramping -sudden shortness of breath -trouble with balance, talking, or walking -unusual vaginal bleeding, discharge -yellowing of the eyes or skin Side effects that usually do not require medical attention (report to your doctor or health care professional if they continue or are bothersome): -acne -breast pain -change in sex drive or performance -changes in weight -cramping, dizziness, or faintness while the device is being inserted -headache -irregular menstrual bleeding within first 3 to 6 months of use -nausea This list may not describe all possible side effects. Call your doctor for medical advice about side effects. You may report side effects to FDA at 1-800-FDA-1088. Where should I keep my medicine? This does not apply. NOTE: This sheet is a summary. It may not cover all possible information. If you have questions about this medicine, talk to your doctor, pharmacist, or health care provider.  2014, Elsevier/Gold Standard. (2011-11-11 13:54:04)  

## 2013-10-01 ENCOUNTER — Telehealth: Payer: Self-pay | Admitting: Nurse Practitioner

## 2013-10-02 ENCOUNTER — Telehealth: Payer: Self-pay | Admitting: Nurse Practitioner

## 2013-10-02 ENCOUNTER — Other Ambulatory Visit: Payer: Self-pay | Admitting: Family Medicine

## 2013-10-02 MED ORDER — GLATIRAMER ACETATE 40 MG/ML ~~LOC~~ SOSY: 40.0000 mg | PREFILLED_SYRINGE | SUBCUTANEOUS | Status: DC

## 2013-10-02 NOTE — Telephone Encounter (Signed)
Rx has been sent.  Please see previous note.

## 2013-10-02 NOTE — Telephone Encounter (Signed)
Rx has been sent  

## 2013-10-04 ENCOUNTER — Ambulatory Visit: Payer: Self-pay | Admitting: Family Medicine

## 2013-10-15 ENCOUNTER — Ambulatory Visit (INDEPENDENT_AMBULATORY_CARE_PROVIDER_SITE_OTHER): Payer: Medicare Other | Admitting: Family Medicine

## 2013-10-15 ENCOUNTER — Other Ambulatory Visit (HOSPITAL_COMMUNITY)
Admission: RE | Admit: 2013-10-15 | Discharge: 2013-10-15 | Disposition: A | Discharge status: Home / Self Care | Payer: Medicare Other | Source: Ambulatory Visit | Attending: Family Medicine | Admitting: Family Medicine

## 2013-10-15 ENCOUNTER — Encounter: Payer: Self-pay | Admitting: Family Medicine

## 2013-10-15 VITALS — BP 130/78 | HR 90 | Ht 64.0 in | Wt 230.0 lb

## 2013-10-15 DIAGNOSIS — Z124 Encounter for screening for malignant neoplasm of cervix: Secondary | ICD-10-CM

## 2013-10-15 DIAGNOSIS — Z1151 Encounter for screening for human papillomavirus (HPV): Secondary | ICD-10-CM | POA: Insufficient documentation

## 2013-10-15 DIAGNOSIS — Z23 Encounter for immunization: Secondary | ICD-10-CM

## 2013-10-15 NOTE — Patient Instructions (Signed)
Allison Moore,  Thank you for coming in today. Your exam is normal. I will be in touch with pap results.  Happy holidays!  Dr. Armen Pickup

## 2013-10-15 NOTE — Assessment & Plan Note (Signed)
Pap done today. Will contact patient with results.

## 2013-10-15 NOTE — Progress Notes (Signed)
   Subjective:    Patient ID: Allison Moore, female    DOB: 1981-01-24, 32 y.o.   MRN: [id number]  HPI 32 yo F presents for pap smear:  1. Pap smear: last one normal 09/2010. Denies vaginal discharge or pain. Sexually active with one partner. LMP 10/05/13.   2. Healthcare maintenance: due for flu shot and pneumovax. Agrees that have both today.   Soc hx: current smoker.    Review of Systems As per HPI     Objective:   Physical Exam BP 130/78  Pulse 90  Ht 5\' 4"  (1.626 m)  Wt 230 lb (104.327 kg)  BMI 39.46 kg/m2  LMP 10/05/2013 General appearance: alert, cooperative and no distress Pelvic: External genitalia: normal Bimanual: no CMT. Cervix is on the anterior. No uterine mass or tenderness. No adnexal mass or tenderness.  Speculum: cervix normal. IUD strings visualized. Scant bloody-mucoid vaginal discharge. Vagina otherwise normal.      Assessment & Plan:

## 2013-10-19 ENCOUNTER — Telehealth: Payer: Self-pay | Admitting: Nurse Practitioner

## 2013-10-19 NOTE — Telephone Encounter (Signed)
We have already contacted her ins, pending response.  I called the patient back, got no answer.  Left message.

## 2013-10-20 ENCOUNTER — Encounter: Payer: Self-pay | Admitting: Family Medicine

## 2013-10-23 ENCOUNTER — Telehealth: Payer: Self-pay

## 2013-10-23 ENCOUNTER — Telehealth: Payer: Self-pay | Admitting: Nurse Practitioner

## 2013-10-23 NOTE — Telephone Encounter (Signed)
The nurses for her copaxsone are waiting on some forms that they faxed over. She is waiting for this in order to have meds.

## 2013-10-23 NOTE — Telephone Encounter (Signed)
I have not gotten a fax.  I called the patient back to see who she needed [place] to contact.  She said the pharmacy already has the Rx.  She will call them back to schedule shipment.  Nothing further is needed at this time.

## 2013-10-23 NOTE — Telephone Encounter (Signed)
[employer] sent [place] a letter saying they have approved ur request for coverage on Copaxone effective 10/19/2013-10/19/2014 Ref # [id number].  Patient is aware.

## 2013-12-25 ENCOUNTER — Telehealth: Payer: Self-pay | Admitting: *Deleted

## 2013-12-25 MED ORDER — CYCLOBENZAPRINE HCL 10 MG PO TABS: 10.0000 mg | ORAL_TABLET | Freq: Two times a day (BID) | ORAL | Status: DC

## 2013-12-25 NOTE — Telephone Encounter (Signed)
I called the patient back to clarify what medication she was referring to.  She said she is not taking Flexeril as prescribed.  Says she has had increased pain, so she is taking one in the morning and one at night instead of one half to one at night only.  I explained the importance of taking meds as prescribed unless directed otherwise by the provider.  She verbalized understanding and would like to know if a new Rx can be written with directions of one twice daily since she only has 3 tablets left.  Please advise.  Thank you. (Last OV with CM in Nov, has F/U with CM in May)

## 2013-12-25 NOTE — Telephone Encounter (Signed)
Ok to take 1 bid. Thanks

## 2013-12-25 NOTE — Telephone Encounter (Signed)
Rx has been updated and sent to the pharmacy.  I called the patient.  Got no answer.  Left message.

## 2014-01-22 ENCOUNTER — Other Ambulatory Visit: Payer: Self-pay | Admitting: Neurology

## 2014-01-25 ENCOUNTER — Telehealth: Payer: Self-pay | Admitting: Neurology

## 2014-01-25 NOTE — Telephone Encounter (Signed)
Barb from [facility] pharmacy calling about patient's Copaxone refill, states that they have an order scheduled to ship out on Monday and she needs to hear back as soon as possible. Please call her back at the number listed.

## 2014-01-25 NOTE — Telephone Encounter (Signed)
We sent [facility] 7 refills on 03/31.   COPAXONE 40 MG/ML SOSY 12 Syringe 6 01/22/2014     Sig: INJECT 40MG  SUBCUTANEOUSLY THREE TIMES PER WEEK    E-Prescribing Status: Receipt confirmed by pharmacy (01/22/2014 9:58 AM EDT)                Pharmacy    [facility] [place] [address]   I called back.  Spoke with Marcelino Duster.  She said she does see that they have the Rx from 03/31, but for some reason they had not verified it yet.  Says they will take care of this today and contact te patient for shipment.

## 2014-02-12 ENCOUNTER — Ambulatory Visit: Payer: Self-pay | Admitting: Family Medicine

## 2014-03-05 ENCOUNTER — Emergency Department (HOSPITAL_COMMUNITY)
Admission: EM | Admit: 2014-03-05 | Discharge: 2014-03-05 | Disposition: A | Discharge status: Other Acute Inpatient Hospital | Payer: Medicare Other | Attending: Emergency Medicine | Admitting: Emergency Medicine

## 2014-03-05 ENCOUNTER — Encounter (HOSPITAL_COMMUNITY): Payer: Self-pay | Admitting: Emergency Medicine

## 2014-03-05 DIAGNOSIS — Z862 Personal history of diseases of the blood and blood-forming organs and certain disorders involving the immune mechanism: Secondary | ICD-10-CM | POA: Insufficient documentation

## 2014-03-05 DIAGNOSIS — F419 Anxiety disorder, unspecified: Secondary | ICD-10-CM

## 2014-03-05 DIAGNOSIS — F329 Major depressive disorder, single episode, unspecified: Secondary | ICD-10-CM | POA: Insufficient documentation

## 2014-03-05 DIAGNOSIS — F1210 Cannabis abuse, uncomplicated: Secondary | ICD-10-CM | POA: Insufficient documentation

## 2014-03-05 DIAGNOSIS — F319 Bipolar disorder, unspecified: Secondary | ICD-10-CM | POA: Insufficient documentation

## 2014-03-05 DIAGNOSIS — R443 Hallucinations, unspecified: Secondary | ICD-10-CM | POA: Insufficient documentation

## 2014-03-05 DIAGNOSIS — F32A Depression, unspecified: Secondary | ICD-10-CM

## 2014-03-05 DIAGNOSIS — F29 Unspecified psychosis not due to a substance or known physiological condition: Secondary | ICD-10-CM

## 2014-03-05 DIAGNOSIS — F064 Anxiety disorder due to known physiological condition: Secondary | ICD-10-CM | POA: Diagnosis present

## 2014-03-05 DIAGNOSIS — R45851 Suicidal ideations: Secondary | ICD-10-CM

## 2014-03-05 DIAGNOSIS — Z79899 Other long term (current) drug therapy: Secondary | ICD-10-CM | POA: Insufficient documentation

## 2014-03-05 DIAGNOSIS — Z8619 Personal history of other infectious and parasitic diseases: Secondary | ICD-10-CM | POA: Insufficient documentation

## 2014-03-05 DIAGNOSIS — F209 Schizophrenia, unspecified: Secondary | ICD-10-CM | POA: Insufficient documentation

## 2014-03-05 DIAGNOSIS — Z8781 Personal history of (healed) traumatic fracture: Secondary | ICD-10-CM | POA: Insufficient documentation

## 2014-03-05 DIAGNOSIS — Z8669 Personal history of other diseases of the nervous system and sense organs: Secondary | ICD-10-CM | POA: Insufficient documentation

## 2014-03-05 DIAGNOSIS — F411 Generalized anxiety disorder: Secondary | ICD-10-CM | POA: Insufficient documentation

## 2014-03-05 DIAGNOSIS — E669 Obesity, unspecified: Secondary | ICD-10-CM | POA: Insufficient documentation

## 2014-03-05 DIAGNOSIS — F3289 Other specified depressive episodes: Secondary | ICD-10-CM | POA: Insufficient documentation

## 2014-03-05 DIAGNOSIS — Z8742 Personal history of other diseases of the female genital tract: Secondary | ICD-10-CM | POA: Insufficient documentation

## 2014-03-05 DIAGNOSIS — Z87891 Personal history of nicotine dependence: Secondary | ICD-10-CM | POA: Insufficient documentation

## 2014-03-05 DIAGNOSIS — Z87440 Personal history of urinary (tract) infections: Secondary | ICD-10-CM | POA: Insufficient documentation

## 2014-03-05 LAB — CBC WITH DIFFERENTIAL/PLATELET
BASOS ABS: 0 10*3/uL (ref 0.0–0.1)
Basophils Relative: 0 % (ref 0–1)
EOS ABS: 0 10*3/uL (ref 0.0–0.7)
EOS PCT: 0 % (ref 0–5)
HEMATOCRIT: 39.7 % (ref 36.0–46.0)
Hemoglobin: 13.4 g/dL (ref 12.0–15.0)
LYMPHS PCT: 43 % (ref 12–46)
Lymphs Abs: 2 10*3/uL (ref 0.7–4.0)
MCH: 29.8 pg (ref 26.0–34.0)
MCHC: 33.8 g/dL (ref 30.0–36.0)
MCV: 88.4 fL (ref 78.0–100.0)
MONO ABS: 0.4 10*3/uL (ref 0.1–1.0)
Monocytes Relative: 8 % (ref 3–12)
Neutro Abs: 2.3 10*3/uL (ref 1.7–7.7)
Neutrophils Relative %: 49 % (ref 43–77)
Platelets: 302 10*3/uL (ref 150–400)
RBC: 4.49 MIL/uL (ref 3.87–5.11)
RDW: 12.6 % (ref 11.5–15.5)
WBC: 4.8 10*3/uL (ref 4.0–10.5)

## 2014-03-05 LAB — RAPID URINE DRUG SCREEN, HOSP PERFORMED
Amphetamines: NOT DETECTED
BARBITURATES: NOT DETECTED
Benzodiazepines: NOT DETECTED
COCAINE: NOT DETECTED
Opiates: NOT DETECTED
Tetrahydrocannabinol: POSITIVE — AB

## 2014-03-05 LAB — COMPREHENSIVE METABOLIC PANEL
ALT: 17 U/L (ref 0–35)
AST: 15 U/L (ref 0–37)
Albumin: 4.3 g/dL (ref 3.5–5.2)
Alkaline Phosphatase: 63 U/L (ref 39–117)
BUN: 11 mg/dL (ref 6–23)
CALCIUM: 9.5 mg/dL (ref 8.4–10.5)
CO2: 26 meq/L (ref 19–32)
CREATININE: 0.68 mg/dL (ref 0.50–1.10)
Chloride: 101 mEq/L (ref 96–112)
Glucose, Bld: 95 mg/dL (ref 70–99)
Potassium: 3.8 mEq/L (ref 3.7–5.3)
Sodium: 138 mEq/L (ref 137–147)
TOTAL PROTEIN: 7.7 g/dL (ref 6.0–8.3)
Total Bilirubin: 0.5 mg/dL (ref 0.3–1.2)

## 2014-03-05 LAB — TROPONIN I: Troponin I: <0.3 ng/mL (ref <0.30)

## 2014-03-05 LAB — ACETAMINOPHEN LEVEL: Acetaminophen (Tylenol), Serum: <15 ug/mL (ref 10–30)

## 2014-03-05 LAB — ETHANOL

## 2014-03-05 LAB — SALICYLATE LEVEL

## 2014-03-05 MED ORDER — LORAZEPAM 1 MG PO TABS: 1.0000 mg | ORAL_TABLET | Freq: Three times a day (TID) | ORAL | Status: DC | PRN

## 2014-03-05 MED ORDER — FLUOXETINE HCL 20 MG PO CAPS: 20.0000 mg | ORAL_CAPSULE | Freq: Every day | ORAL | Status: DC

## 2014-03-05 MED ORDER — IBUPROFEN 200 MG PO TABS: 600.0000 mg | ORAL_TABLET | Freq: Three times a day (TID) | ORAL | Status: DC | PRN

## 2014-03-05 MED ORDER — GLATIRAMER ACETATE 40 MG/ML ~~LOC~~ SOSY: 40.0000 mg | PREFILLED_SYRINGE | SUBCUTANEOUS | Status: DC

## 2014-03-05 MED ORDER — RISPERIDONE MICROSPHERES 25 MG IM SUSR: 25.0000 mg | INTRAMUSCULAR | Status: DC

## 2014-03-05 MED ORDER — PALIPERIDONE ER 6 MG PO TB24: 6.0000 mg | ORAL_TABLET | Freq: Every day | ORAL | Status: DC

## 2014-03-05 MED ORDER — CYCLOBENZAPRINE HCL 10 MG PO TABS
10.0000 mg | ORAL_TABLET | Freq: Two times a day (BID) | ORAL | Status: DC
  Administered 2014-03-05: 10 mg via ORAL
  Filled 2014-03-05: qty 1

## 2014-03-05 MED ORDER — ACETAMINOPHEN 325 MG PO TABS: 650.0000 mg | ORAL_TABLET | ORAL | Status: DC | PRN
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
## 2014-03-05 NOTE — ED Notes (Addendum)
Per EMS: pt c/o anxiety, stating she needs to talk to someone. Pt stated she felt like her heart was racing, denies chest pain SOB. Denies SI/ HI
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
## 2014-03-05 NOTE — BH Assessment (Signed)
Shuvon, NP recommends inpatient. Per Morrie Sheldon, Patient accepted to [facility] by Dr. Hardie Pulley. Nursing report [phone].

## 2014-03-05 NOTE — ED Notes (Signed)
Pt provided urine sample

## 2014-03-05 NOTE — ED Notes (Signed)
[facility] called 
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
## 2014-03-06 NOTE — Consult Note (Signed)
Face to face evaluation and I agree with this note 

## 2014-03-20 ENCOUNTER — Encounter: Payer: Self-pay | Admitting: Nurse Practitioner

## 2014-03-20 ENCOUNTER — Ambulatory Visit (INDEPENDENT_AMBULATORY_CARE_PROVIDER_SITE_OTHER): Payer: Medicare Other | Admitting: Nurse Practitioner

## 2014-03-20 ENCOUNTER — Encounter (INDEPENDENT_AMBULATORY_CARE_PROVIDER_SITE_OTHER): Payer: Self-pay

## 2014-03-20 VITALS — BP 117/71 | HR 98 | Ht 65.5 in | Wt 250.0 lb

## 2014-03-20 DIAGNOSIS — F319 Bipolar disorder, unspecified: Secondary | ICD-10-CM

## 2014-03-20 DIAGNOSIS — R51 Headache: Secondary | ICD-10-CM

## 2014-03-20 DIAGNOSIS — G35D Multiple sclerosis, unspecified: Secondary | ICD-10-CM

## 2014-03-20 DIAGNOSIS — R269 Unspecified abnormalities of gait and mobility: Secondary | ICD-10-CM

## 2014-03-20 DIAGNOSIS — G35 Multiple sclerosis: Secondary | ICD-10-CM

## 2014-03-20 MED ORDER — CYCLOBENZAPRINE HCL 10 MG PO TABS: 10.0000 mg | ORAL_TABLET | Freq: Two times a day (BID) | ORAL | Status: DC
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
## 2014-03-20 NOTE — Patient Instructions (Addendum)
Application for handicapped sticker given Continue Copaxone  3 times a week Continue Flexeril twice daily for spasms and headache, will refill Daily exercise and healthy diet Followup in 6 months

## 2014-03-20 NOTE — Progress Notes (Signed)
I agree with the assessment and plan as directed by NP .The patient is known to me .   Genaro Bekker, MD  

## 2014-04-10 ENCOUNTER — Telehealth: Payer: Self-pay | Admitting: *Deleted

## 2014-04-10 ENCOUNTER — Ambulatory Visit: Payer: Self-pay | Admitting: Obstetrics & Gynecology

## 2014-04-10 NOTE — Telephone Encounter (Signed)
Attempted to call patient, no answer, left message that patient may call and reschedule missed appointment if she feels like she needs to be seen by Allison Moore.

## 2014-04-16 ENCOUNTER — Telehealth: Payer: Self-pay | Admitting: Neurology

## 2014-04-16 NOTE — Telephone Encounter (Signed)
Patient requesting a letter written by Dr. Vickey Hugerohmeier stating her medical condition so she can move into a group home. Please return call to patient and advise.

## 2014-04-19 ENCOUNTER — Telehealth: Payer: Self-pay | Admitting: Family Medicine

## 2014-04-19 NOTE — Telephone Encounter (Signed)
Pt called and would like a letter stating all her illness and issues that she has. Please put up front and call when this is ready. [phone]. jw

## 2014-04-19 NOTE — Telephone Encounter (Signed)
Pt called back wanted to know if the letter was completed I saw where CM is wanting to collect fees first, this was forward to [place] and I will let her know pt called but I didn't mention fees wasn't for sure what the fees were for so I am going to send back to [place]. Thanks

## 2014-04-22 ENCOUNTER — Encounter: Payer: Self-pay | Admitting: Family Medicine

## 2014-04-22 NOTE — Telephone Encounter (Signed)
Patient is aware of this. Allison Moore,CMA  

## 2014-04-22 NOTE — Telephone Encounter (Signed)
Please inform patient.  Letter written and placed up front for pick up.

## 2014-06-21 NOTE — Telephone Encounter (Signed)
Noted  

## 2014-08-26 ENCOUNTER — Encounter: Payer: Self-pay | Admitting: Nurse Practitioner

## 2014-09-24 ENCOUNTER — Ambulatory Visit: Payer: Medicare Other | Admitting: Neurology

## 2014-10-01 ENCOUNTER — Encounter: Payer: Self-pay | Admitting: Neurology

## 2014-10-02 ENCOUNTER — Ambulatory Visit: Payer: Self-pay | Admitting: Obstetrics & Gynecology

## 2014-10-11 ENCOUNTER — Ambulatory Visit (INDEPENDENT_AMBULATORY_CARE_PROVIDER_SITE_OTHER): Payer: Medicare Other | Admitting: *Deleted

## 2014-10-11 ENCOUNTER — Encounter: Payer: Self-pay | Admitting: Family Medicine

## 2014-10-11 ENCOUNTER — Ambulatory Visit (INDEPENDENT_AMBULATORY_CARE_PROVIDER_SITE_OTHER): Payer: Medicare Other | Admitting: Family Medicine

## 2014-10-11 VITALS — BP 135/67 | HR 112 | Temp 99.0°F | Ht 65.5 in | Wt 246.8 lb

## 2014-10-11 DIAGNOSIS — Z23 Encounter for immunization: Secondary | ICD-10-CM

## 2014-10-11 NOTE — Progress Notes (Signed)
Here only for flu shot.  Will no charge pt and nurse to give influenza vaccine.  Twana First Paulina Fusi, DO of [facility] 10/11/2014, 4:55 PM

## 2014-11-13 ENCOUNTER — Ambulatory Visit: Payer: Self-pay | Admitting: Obstetrics & Gynecology

## 2014-11-19 ENCOUNTER — Telehealth: Payer: Self-pay

## 2014-11-19 NOTE — Telephone Encounter (Signed)
Patient calling she has apt. In February and she needs to know what she needs to pay.

## 2014-11-29 ENCOUNTER — Ambulatory Visit: Payer: Medicare Other | Admitting: Neurology

## 2014-11-29 ENCOUNTER — Telehealth: Payer: Self-pay | Admitting: Neurology

## 2014-11-29 NOTE — Telephone Encounter (Signed)
Pt is calling to say she wants Dr. Vickey Huger to help her get placed into a Group Home due to she doesn't think she can function with MS.  She is misplacing things and she doesn't have anybody helping her. She had to rescheduled her appointment for today due to [facility] said they could not have her here until 10:30.  I did reschedule her to come in on Monday. Please call and advise.

## 2014-11-29 NOTE — Telephone Encounter (Signed)
Needs a 30 minute VISIT for face to face cognitive  evaluation with MOCA and Placement. i believe she can do well with some assistance.  We scheduled her for Monday, she will need to schedule [facility] bus.

## 2014-12-02 ENCOUNTER — Ambulatory Visit: Payer: Medicare Other | Admitting: Neurology

## 2014-12-03 ENCOUNTER — Encounter: Payer: Self-pay | Admitting: Neurology

## 2014-12-03 ENCOUNTER — Telehealth: Payer: Self-pay | Admitting: Neurology

## 2014-12-03 ENCOUNTER — Ambulatory Visit (INDEPENDENT_AMBULATORY_CARE_PROVIDER_SITE_OTHER): Payer: 59 | Admitting: Neurology

## 2014-12-03 VITALS — BP 135/94 | HR 95 | Resp 16 | Ht 66.0 in | Wt 245.0 lb

## 2014-12-03 DIAGNOSIS — F3130 Bipolar disorder, current episode depressed, mild or moderate severity, unspecified: Secondary | ICD-10-CM

## 2014-12-03 DIAGNOSIS — G35 Multiple sclerosis: Secondary | ICD-10-CM

## 2014-12-03 NOTE — Telephone Encounter (Signed)
Pt has appt 12/03/14 
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
## 2014-12-03 NOTE — Telephone Encounter (Signed)
Patient wants [place] to know that she uses [facility] on [address]. She is not sure if a prescription is being called in there for her today. Please call and let her know. Best number is [phone]

## 2014-12-04 MED ORDER — CYCLOBENZAPRINE HCL 10 MG PO TABS: 10.0000 mg | ORAL_TABLET | Freq: Two times a day (BID) | ORAL | Status: DC

## 2014-12-04 MED ORDER — GLATIRAMER ACETATE 40 MG/ML ~~LOC~~ SOSY: 40.0000 mg | PREFILLED_SYRINGE | SUBCUTANEOUS | Status: DC

## 2014-12-04 NOTE — Telephone Encounter (Signed)
Unfortunately, Copaxone cannot be sent to retail pharmacy, as this is a specialty med.  I called the patient back.  She would like Copaxone sent to [facility].  Rx has been sent.

## 2014-12-04 NOTE — Telephone Encounter (Signed)
I called pt and she needs refill on copaxone.   Will forward to [place] [facility] / pharm.

## 2014-12-10 ENCOUNTER — Telehealth: Payer: Self-pay | Admitting: *Deleted

## 2014-12-10 NOTE — Telephone Encounter (Signed)
Patient is needing preauth done on Capaxone. Please advise

## 2014-12-10 NOTE — Telephone Encounter (Signed)
All requested info has been sent to ins.  Request is currently pending.  Ref Key # [id number]

## 2014-12-23 ENCOUNTER — Telehealth: Payer: Self-pay | Admitting: Neurology

## 2014-12-23 NOTE — Telephone Encounter (Signed)
Consuella Lose with [facility] is calling.  Patient did not have copaxone injection today as patient stated her mother needs to pay pharmacy bill before she can get medication. No need for call back - just needed to inform you.  Thanks!

## 2014-12-23 NOTE — Telephone Encounter (Signed)
Shanon Brow with [facility] is calling to get verbal approval for PT.  He met with her last Friday and the following is his suggested schedule going forward:  1 time a week for 1 weel, 2 times a week for 4 weeks, then 1 time a week for 1 one week.  Working  on gape, balance, endurance, and breathing techniques.  Please call _[phone]

## 2014-12-23 NOTE — Telephone Encounter (Signed)
I called and LMVM for pt that ok to proceed with PT as below recommendations.

## 2014-12-24 ENCOUNTER — Telehealth: Payer: Self-pay | Admitting: Neurology

## 2014-12-24 NOTE — Telephone Encounter (Signed)
Allison Moore with [facility] @ [phone], stated pt is not having injections of Rx Glatiramer Acetate (COPAXONE) 40 MG/ML SOSY until Thursday.  Patient missed Monday and Wednesday dose.  FYI

## 2014-12-25 NOTE — Telephone Encounter (Signed)
I called and LMVM for pt that would like a call back re: to pt and her getting the copaxone.  She has missed 2 doses this week per Consuella Lose with [facility] and she is not very forthcoming with information about getting the medication or if needs financial assistance.

## 2014-12-25 NOTE — Telephone Encounter (Signed)
I spoke to pt and she will receive the shipment on Friday.   Had last dose was last Friday.   She stated the copay is $7.40.  I explained that important to keep on schedule.  If needs financial assistance to let us know.  I will call Consuella Lose and let her know.  Pt wanted an HH aide vs nurse.  If needs SW, can address future needs.  Pt ok right now, but will leet [place] know.

## 2014-12-26 ENCOUNTER — Telehealth: Payer: Self-pay | Admitting: *Deleted

## 2014-12-26 NOTE — Telephone Encounter (Signed)
I spoke to [place], RN with [facility].    Will go ahead with social work referral to assess needs (possibility of getting copaxone to her home, cost free, as well as HH aide).  Pt made aware as well.

## 2014-12-26 NOTE — Telephone Encounter (Signed)
[facility] is requesting verbal order for medical services for the patient. Her father will be moving out of the home and he does most of her cooking and cleaning and things around the house.  Please call Onalee Hua at [phone] with any other questions.

## 2014-12-31 NOTE — Telephone Encounter (Signed)
I called and LMVM for Onalee Hua with [facility] that ok for medical services [facility] aide). If he has other questions to let us know.

## 2015-01-01 ENCOUNTER — Telehealth: Payer: Self-pay | Admitting: Family Medicine

## 2015-01-01 NOTE — Telephone Encounter (Signed)
Returning Dr. Purvis Sheffield call, please call pt asap / sr

## 2015-01-01 NOTE — Telephone Encounter (Signed)
Thanks for speaking to the patient. Please advise her that she should be seen in clinic for evaluation of this issue. Also sent message to Nelva Bush so she could touch base with patient to offer services if needed.

## 2015-01-01 NOTE — Telephone Encounter (Signed)
Say pt today. Pt was crying saying she wasn't a good person. She is smoking pot and as her father. Pt feels she needs to be admitted somewhere. She says she needs help but doesn't like going to "those places" She missed her meds on Monday. She is in AA but hasnt stopped drinking again Please advise

## 2015-01-01 NOTE — Telephone Encounter (Signed)
Spoke with patient and informed her of message from MD.  She states that adult care services or her nurse at [facility] will be visiting her tomorrow.  She declined going to the ED for eval.  Calissa Swenor,CMA
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
## 2015-01-02 NOTE — Telephone Encounter (Signed)
CSW contacted pt to assess and explore resources needed. Pt informed CSW that was feeling "down and depressed." CSW confirmed pts current location (home address). Pt denied any SI/HI and psychosis. Pt reports that she does have a psychiatrist and therapist and her next appointment is with her therapist at [facility] today at 1600. Pt confirms that she will attend the appointment and is agreeable to contact [facility] or 911 if a she is in crisis. CSW observed that pts responses were very delayed and pressured and therefore it was difficult to converse with pt over the phone. Pt appreciative of call and will contact CSW back if additional needs arise. Pt currently appears to have necessary resources in place and aware of who to call if she experiences a crisis.  Theresia Bough, MSW, LCSW [phone]

## 2015-01-02 NOTE — Telephone Encounter (Signed)
I appreciate CSW assistance with this. Patient needs to follow-up in clinic for monitoring of this issue. Please set this up for the patient to follow-up with PCP.

## 2015-01-03 NOTE — Telephone Encounter (Signed)
Spoke with patient and informed her about following up with PCP. Gave patient options and she did not accept any options i gave her, states that she will call back

## 2015-01-06 ENCOUNTER — Ambulatory Visit (INDEPENDENT_AMBULATORY_CARE_PROVIDER_SITE_OTHER): Payer: Medicare Other | Admitting: Family Medicine

## 2015-01-06 ENCOUNTER — Encounter: Payer: Self-pay | Admitting: Family Medicine

## 2015-01-06 VITALS — BP 107/72 | HR 86 | Temp 99.0°F | Ht 65.0 in | Wt 242.6 lb

## 2015-01-06 DIAGNOSIS — F3170 Bipolar disorder, currently in remission, most recent episode unspecified: Secondary | ICD-10-CM
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
## 2015-01-06 NOTE — Assessment & Plan Note (Signed)
Pt currently stable w/o SI/HI today  - Referral to [facility] for psychiatric care, as she needs specialized care with multiple comorbid psychiatry disorders along with 1st/2nd generation antipsychotic use

## 2015-01-07 ENCOUNTER — Telehealth: Payer: Self-pay | Admitting: *Deleted

## 2015-01-07 NOTE — Telephone Encounter (Signed)
I called pt and she is not wanting to move to group home which may require the FL-2 form .  I told her that I would call [facility] SW and speak to [place] and then call her back.

## 2015-01-07 NOTE — Telephone Encounter (Signed)
I LMVM for pt to return call about FL-2 form needing filled out.

## 2015-01-07 NOTE — Telephone Encounter (Signed)
Patient returning Sandy, RN's call. °

## 2015-01-08 NOTE — Telephone Encounter (Signed)
I spoke to [place], SW with [facility].  She relayed that she did get message from pt and that pt did not want to proceed with [facility].  Would be good to have form  in case she does want to with the next 30 days.  I will try and get filled out.

## 2015-01-08 NOTE — Telephone Encounter (Signed)
LMVM for Allison Moore [phone] to give me a call back re: to form needed.

## 2015-01-09 NOTE — Telephone Encounter (Signed)
I spoke to Sallis in [facility] SW Navigator and relayed that faxed FL-2 and pt ready to proceed.   She would let Cindy know.

## 2015-01-09 NOTE — Telephone Encounter (Signed)
I called and spoke with pt who was crying when I picked up the phone.  She stated she needed to get out of where she was living.   I told her that I was calling in relation of this.  Filling out FL-2 form for family care home.  I would fax right away.  She was ok with this.

## 2015-01-20 ENCOUNTER — Telehealth: Payer: Self-pay | Admitting: Family Medicine

## 2015-01-20 NOTE — Telephone Encounter (Signed)
Wants to know if the papers she brought at her last visit were faxed to [facility]. She also never heard from her behavioral health referrel

## 2015-01-21 NOTE — Telephone Encounter (Signed)
No clue about the papers?  Referral I'm familiar with however.  Thanks. Twana First Paulina Fusi, DO of [facility] 01/21/2015, 8:32 AM

## 2015-01-22 NOTE — Telephone Encounter (Signed)
Spoke with pt and she said that there was no paper work that it was a number and she was needing a referral to go to [facility], will try to figure out exactly what this is.  She also stated that she wanted to drop off paper work for a referral for [facility] to have a nurse come into her home.  I told her she could drop the paper work and that it would go to the doctor and he would review it to see what he could do for her. Lamonte Sakai, Keona Sheffler D

## 2015-01-22 NOTE — Telephone Encounter (Signed)
Tried to contact pt to inquire about paper work mentioned below and to update her on her referral.  Tried twice, first time phone rang twice and then did nothing, second time it rang three times and then went to a busy signal. Lamonte Sakai, Fernanda Twaddell D

## 2015-01-23 ENCOUNTER — Telehealth: Payer: Self-pay | Admitting: Family Medicine

## 2015-01-23 NOTE — Telephone Encounter (Signed)
Patient dropped [place] Form to be completed and sign by PCP. Please, follow up with Patient.

## 2015-01-23 NOTE — Telephone Encounter (Signed)
Both forms given to [facility] for fax/pick up.    Thanks [employer]. Paulina Fusi, DO of [facility] 01/23/2015, 3:01 PM

## 2015-01-23 NOTE — Telephone Encounter (Signed)
Placed in pcp box. Lamonte Sakai, [place] Cu D

## 2015-01-23 NOTE — Telephone Encounter (Signed)
Placed in pcp box. Allison Moore, Allison Moore  

## 2015-01-23 NOTE — Telephone Encounter (Signed)
Patient dropped [place] Form to be completed and signed by PCP. Please, follow up with Patient. °

## 2015-01-23 NOTE — Telephone Encounter (Signed)
We also need to find out what kind of referral she is seeking for [facility].  We don't have any paperwork or anything for this so we need more information in order to do anything with this. So if pt calls back we need to inform her of below but also ask about some paperwork for this referral. Allison Moore, April D

## 2015-01-23 NOTE — Telephone Encounter (Signed)
Left voice message for pt informing her that forms are complete and ready for pick up.  One of the forms were faxed to number provided.  Clovis Pu, RN

## 2015-01-23 NOTE — Telephone Encounter (Signed)
Patient requests referral for [facility] to be faxed to [phone]. Please, follow up with Patient.

## 2015-01-23 NOTE — Telephone Encounter (Signed)
Left voice message for pt informing her that form was not faxed because patient did not complete her part. Clovis Pu, RN

## 2015-02-05 ENCOUNTER — Telehealth: Payer: Self-pay | Admitting: Family Medicine

## 2015-02-05 NOTE — Telephone Encounter (Signed)
Pt called and needs the orders for home health re-faxed to [facility] [phone]. jw

## 2015-02-06 NOTE — Telephone Encounter (Signed)
The [facility] referral from 11/2014 was sent from [facility]. I'm guessing they would need to send a new order?

## 2015-02-06 NOTE — Telephone Encounter (Signed)
CSW attempted to reach pt however call when to an automated message that a message could not be left. CSW will inform pt that the last order from 2/16 from [facility]. If pt needing new orders CSW thinking pt may need to come in for a face to face as the order is over 77 days old.  Theresia Bough, MSW, LCSW [phone]

## 2015-02-14 ENCOUNTER — Encounter: Payer: Self-pay | Admitting: Neurology

## 2015-02-27 ENCOUNTER — Telehealth: Payer: Self-pay | Admitting: Neurology

## 2015-02-27 NOTE — Telephone Encounter (Signed)
I called the patient back.  Got no answer.  Left message advising we can contact insurance to see if they will grant an exception.  Patient should have several refills remaining at the pharmacy.   Ins has been contacted and provided with clinical info.  Request is under review Ref Key: [id number]

## 2015-02-27 NOTE — Telephone Encounter (Signed)
Patient called stating that medicare will not cover cyclobenzaprine (FLEXERIL) 10 MG tablet but will cover LYRICA. Patient needs a refill for the FLEXERIL. Please call and advice # [phone]

## 2015-02-27 NOTE — Telephone Encounter (Signed)
Patient called stating that she gave me the wrong number. The correct number to reach her is # [phone]

## 2015-03-03 ENCOUNTER — Ambulatory Visit (INDEPENDENT_AMBULATORY_CARE_PROVIDER_SITE_OTHER): Payer: Medicare Other | Admitting: Neurology

## 2015-03-03 ENCOUNTER — Encounter: Payer: Self-pay | Admitting: Neurology

## 2015-03-03 VITALS — BP 122/80 | HR 82 | Resp 16 | Ht 66.14 in | Wt 239.0 lb

## 2015-03-03 DIAGNOSIS — R29898 Other symptoms and signs involving the musculoskeletal system: Secondary | ICD-10-CM | POA: Diagnosis not present

## 2015-03-03 DIAGNOSIS — M25669 Stiffness of unspecified knee, not elsewhere classified: Secondary | ICD-10-CM | POA: Insufficient documentation

## 2015-03-03 DIAGNOSIS — G35 Multiple sclerosis: Secondary | ICD-10-CM | POA: Insufficient documentation

## 2015-03-03 HISTORY — DX: Other symptoms and signs involving the musculoskeletal system: R29.898

## 2015-03-03 HISTORY — DX: Stiffness of unspecified knee, not elsewhere classified: M25.669

## 2015-03-03 MED ORDER — GLATIRAMER ACETATE 40 MG/ML ~~LOC~~ SOSY
40.0000 mg | PREFILLED_SYRINGE | SUBCUTANEOUS | Status: DC
Start: 2015-03-03 — End: 2016-03-12

## 2015-03-03 NOTE — Telephone Encounter (Signed)
[facility] [facility]) has approved the request for coverage on Cyclobenzaprine effective until 10/25/2015 Ref # [id number].  They indicate the patient has been notified of this decision.
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
## 2015-03-04 ENCOUNTER — Other Ambulatory Visit: Payer: Self-pay | Admitting: Neurology

## 2015-03-12 ENCOUNTER — Encounter: Payer: Self-pay | Admitting: Family Medicine

## 2015-03-12 NOTE — Progress Notes (Unsigned)
Forms placed in providers box. Lamonte Sakai, April D, [place]

## 2015-03-12 NOTE — Progress Notes (Unsigned)
Patient dropped off papers to be filled out for transportation.  Please fax when completed.

## 2015-03-14 ENCOUNTER — Encounter: Payer: Self-pay | Admitting: Family Medicine

## 2015-03-14 NOTE — Progress Notes (Signed)
I am covering for Dr. Paulina Fusi who is away from the office.  Will defer completion of this form to him when he returns as he is more knowledgeable of patient's condition.  Latrelle Dodrill, MD

## 2015-03-14 NOTE — Progress Notes (Signed)
I am covering for Dr. Paulina Fusi who is away from the office.  I have reviewed the form, it requires attesting the pt's ability to manage her own finances. Will defer completion of this form to him when he returns as he is more knowledgeable of patient's condition.  Latrelle Dodrill, MD

## 2015-03-14 NOTE — Progress Notes (Signed)
Patient is dropping off form to be completed for Social Services, so that her "payee" may be changed to [facility]. Please contact the patient at [phone] when it is ready to be picked up. Thank you, Allison Moore, ASA   The patient has picked up her completed form. I reminded her of her part of the form that still needed to be completed by herself. Allison Moore, ASA

## 2015-03-14 NOTE — Progress Notes (Signed)
Form placed in provider box. Zimmerman Rumple, Jahzaria Vary D, CMA  

## 2015-03-18 ENCOUNTER — Telehealth: Payer: Self-pay | Admitting: *Deleted

## 2015-03-18 NOTE — Telephone Encounter (Signed)
LVM for pt to return call to inform her that her [place] forms were faxed today.  Allison Moore, April D, [place]

## 2015-03-18 NOTE — Progress Notes (Signed)
Completed, ready for pick up.  Thanks [employer]. Paulina Fusi, DO of [facility] 03/18/2015, 11:31 AM

## 2015-03-18 NOTE — Progress Notes (Signed)
LVM for pt at number below to inform her that the forms are complete. Lamonte Sakai, April D, [place]

## 2015-03-18 NOTE — Telephone Encounter (Signed)
Pt came in and picked up some other forms and she was notified of below then. Lamonte Sakai, April D, [place]

## 2015-06-17 ENCOUNTER — Telehealth: Payer: Self-pay | Admitting: *Deleted

## 2015-06-17 NOTE — Telephone Encounter (Signed)
Appt tomorrow at 2:15 PM.  Clovis Pu, RN

## 2015-06-17 NOTE — Telephone Encounter (Signed)
Patient having heavy vaginal bleeding. Would like to speak to a nurse regarding whether or not she should be seen.

## 2015-06-17 NOTE — Telephone Encounter (Signed)
Pt called back and asked for a nurse to return her call at [phone]. Sadie Reynolds, ASA

## 2015-06-18 ENCOUNTER — Ambulatory Visit: Payer: Self-pay | Admitting: Family Medicine

## 2015-06-19 ENCOUNTER — Ambulatory Visit: Payer: Self-pay | Admitting: Family Medicine

## 2015-06-20 ENCOUNTER — Encounter (HOSPITAL_COMMUNITY): Payer: Self-pay | Admitting: Emergency Medicine

## 2015-06-20 ENCOUNTER — Emergency Department (HOSPITAL_COMMUNITY)
Admission: EM | Admit: 2015-06-20 | Discharge: 2015-06-20 | Disposition: A | Discharge status: Home / Self Care | Payer: Medicare Other | Attending: Emergency Medicine | Admitting: Emergency Medicine

## 2015-06-20 DIAGNOSIS — N939 Abnormal uterine and vaginal bleeding, unspecified: Secondary | ICD-10-CM | POA: Diagnosis not present

## 2015-06-20 DIAGNOSIS — Z3202 Encounter for pregnancy test, result negative: Secondary | ICD-10-CM | POA: Insufficient documentation

## 2015-06-20 DIAGNOSIS — Z87828 Personal history of other (healed) physical injury and trauma: Secondary | ICD-10-CM | POA: Insufficient documentation

## 2015-06-20 DIAGNOSIS — Z720 Tobacco use: Secondary | ICD-10-CM | POA: Diagnosis not present

## 2015-06-20 DIAGNOSIS — Z8742 Personal history of other diseases of the female genital tract: Secondary | ICD-10-CM | POA: Diagnosis not present

## 2015-06-20 DIAGNOSIS — R109 Unspecified abdominal pain: Secondary | ICD-10-CM | POA: Diagnosis present

## 2015-06-20 DIAGNOSIS — E669 Obesity, unspecified: Secondary | ICD-10-CM | POA: Diagnosis not present

## 2015-06-20 DIAGNOSIS — F319 Bipolar disorder, unspecified: Secondary | ICD-10-CM | POA: Insufficient documentation

## 2015-06-20 DIAGNOSIS — Z79899 Other long term (current) drug therapy: Secondary | ICD-10-CM | POA: Insufficient documentation

## 2015-06-20 DIAGNOSIS — N390 Urinary tract infection, site not specified: Secondary | ICD-10-CM | POA: Insufficient documentation

## 2015-06-20 DIAGNOSIS — F419 Anxiety disorder, unspecified: Secondary | ICD-10-CM | POA: Diagnosis not present

## 2015-06-20 LAB — WET PREP, GENITAL
TRICH WET PREP: NONE SEEN
WBC WET PREP: NONE SEEN
Yeast Wet Prep HPF POC: NONE SEEN

## 2015-06-20 LAB — URINE MICROSCOPIC-ADD ON

## 2015-06-20 LAB — URINALYSIS, ROUTINE W REFLEX MICROSCOPIC
BILIRUBIN URINE: NEGATIVE
GLUCOSE, UA: NEGATIVE mg/dL
KETONES UR: NEGATIVE mg/dL
NITRITE: NEGATIVE
Protein, ur: 30 mg/dL — AB
Specific Gravity, Urine: 1.021 (ref 1.005–1.030)
Urobilinogen, UA: 0.2 mg/dL (ref 0.0–1.0)
pH: 6.5 (ref 5.0–8.0)

## 2015-06-20 LAB — I-STAT BETA HCG BLOOD, ED (MC, WL, AP ONLY): I-stat hCG, quantitative: <5 m[IU]/mL (ref <5)

## 2015-06-20 MED ORDER — PHENAZOPYRIDINE HCL 200 MG PO TABS: 200.0000 mg | ORAL_TABLET | Freq: Three times a day (TID) | ORAL | Status: DC | PRN

## 2015-06-20 MED ORDER — NITROFURANTOIN MONOHYD MACRO 100 MG PO CAPS: 100.0000 mg | ORAL_CAPSULE | Freq: Two times a day (BID) | ORAL | Status: DC

## 2015-06-20 NOTE — ED Notes (Signed)
COULDN'T FIND A VEIN,PATIENT NEEDS ULTRA SOUND TO COLLECT BLOOD SAMPLES
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
## 2015-06-20 NOTE — Discharge Instructions (Signed)
You have been seen today for your complaint of pain with urination. Your lab work showed urine infection. Your discharge medications include: 1) macrobid Please take all of your antibiotics until finished!   You may develop abdominal discomfort or diarrhea from the antibiotic.  You may help offset this with probiotics which you can buy or get in yogurt. Do not eat  or take the probiotics until 2 hours after your antibiotic.  2) Pyridium This medication will help relieve pain and burning but does not treat the infection.  Make sure that you wear a panty liner as it may stain your underwear. Home care instructions are as follows:  1) please drink plenty of water, avoid tea and beverages with caffeine like coffee or soda 2) if you are sexually active, ,make sure to urinate immediately after intercourse Follow up with: your doctor or the emergency department Please seek immediate medical care if you develop any of the following symptoms: SEEK MEDICAL CARE IF:  You have back pain.  You develop a fever.  Your symptoms do not begin to resolve within 3 days.  SEEK IMMEDIATE MEDICAL CARE IF:  You have severe back pain or lower abdominal pain.  You develop chills.  You have nausea or vomiting.  You have continued burning or discomfort with urination.  Urinary Tract Infection Urinary tract infections (UTIs) can develop anywhere along your urinary tract. Your urinary tract is your body's drainage system for removing wastes and extra water. Your urinary tract includes two kidneys, two ureters, a bladder, and a urethra. Your kidneys are a pair of bean-shaped organs. Each kidney is about the size of your fist. They are located below your ribs, one on each side of your spine. CAUSES Infections are caused by microbes, which are microscopic organisms, including fungi, viruses, and bacteria. These organisms are so small that they can only be seen through a microscope. Bacteria are the microbes that most  commonly cause UTIs. SYMPTOMS  Symptoms of UTIs may vary by age and gender of the patient and by the location of the infection. Symptoms in young women typically include a frequent and intense urge to urinate and a painful, burning feeling in the bladder or urethra during urination. Older women and men are more likely to be tired, shaky, and weak and have muscle aches and abdominal pain. A fever may mean the infection is in your kidneys. Other symptoms of a kidney infection include pain in your back or sides below the ribs, nausea, and vomiting. DIAGNOSIS To diagnose a UTI, your caregiver will ask you about your symptoms. Your caregiver also will ask to provide a urine sample. The urine sample will be tested for bacteria and white blood cells. White blood cells are made by your body to help fight infection. TREATMENT  Typically, UTIs can be treated with medication. Because most UTIs are caused by a bacterial infection, they usually can be treated with the use of antibiotics. The choice of antibiotic and length of treatment depend on your symptoms and the type of bacteria causing your infection. HOME CARE INSTRUCTIONS  If you were prescribed antibiotics, take them exactly as your caregiver instructs you. Finish the medication even if you feel better after you have only taken some of the medication.  Drink enough water and fluids to keep your urine clear or pale yellow.  Avoid caffeine, tea, and carbonated beverages. They tend to irritate your bladder.  Empty your bladder often. Avoid holding urine for long periods of time.  Empty your bladder before and after sexual intercourse.  After a bowel movement, women should cleanse from front to back. Use each tissue only once. SEEK MEDICAL CARE IF:   You have back pain.  You develop a fever.  Your symptoms do not begin to resolve within 3 days. SEEK IMMEDIATE MEDICAL CARE IF:   You have severe back pain or lower abdominal pain.  You develop  chills.  You have nausea or vomiting.  You have continued burning or discomfort with urination. MAKE SURE YOU:   Understand these instructions.  Will watch your condition.  Will get help right away if you are not doing well or get worse. Document Released: 07/21/2005 Document Revised: 04/11/2012 Document Reviewed: 11/19/2011 [facility] Patient Information 2015 [place], [place]. This information is not intended to replace advice given to you by your health care provider. Make sure you discuss any questions you have with your health care provider.

## 2015-06-20 NOTE — ED Notes (Signed)
Bed: ZO10 Expected date:  Expected time:  Means of arrival:  Comments: EMS-abdominal pain/vag bleeding

## 2015-06-20 NOTE — ED Notes (Signed)
Per EMS, pt called for abdominal pain with vaginal bleeding. Could not tell EMS when her last menstrual was.  Pt states vaginal bleeding x 3 days.  No n/v/d. No fever.  Vitals:138 palp, 99% ra, hr 84, resp 20

## 2015-06-20 NOTE — ED Notes (Signed)
Patient states she has had vaginal pain with bleeding for 3 days.  No an/v or diarrhea.  Burning upon urination.  No fever.

## 2015-06-21 LAB — HIV ANTIBODY (ROUTINE TESTING W REFLEX): HIV Screen 4th Generation wRfx: NONREACTIVE

## 2015-06-21 LAB — RPR: RPR: NONREACTIVE

## 2015-06-23 LAB — GC/CHLAMYDIA PROBE AMP (~~LOC~~) NOT AT ARMC
Chlamydia: NEGATIVE
Neisseria Gonorrhea: NEGATIVE

## 2015-08-04 ENCOUNTER — Ambulatory Visit: Payer: Medicare Other | Admitting: Adult Health

## 2015-08-04 NOTE — Telephone Encounter (Signed)
This encounter was created in error - please disregard.

## 2015-08-06 ENCOUNTER — Encounter: Payer: Self-pay | Admitting: Neurology

## 2015-08-22 ENCOUNTER — Ambulatory Visit: Payer: Self-pay | Admitting: Family Medicine

## 2015-09-01 DIAGNOSIS — Z310 Encounter for reversal of previous sterilization: Secondary | ICD-10-CM | POA: Diagnosis not present

## 2015-09-25 ENCOUNTER — Ambulatory Visit (INDEPENDENT_AMBULATORY_CARE_PROVIDER_SITE_OTHER): Payer: Medicare Other | Admitting: Obstetrics and Gynecology

## 2015-09-25 ENCOUNTER — Encounter: Payer: Self-pay | Admitting: Obstetrics and Gynecology

## 2015-09-25 VITALS — BP 115/102 | HR 79 | Temp 97.8°F | Ht 65.0 in | Wt 254.2 lb

## 2015-09-25 DIAGNOSIS — Z30432 Encounter for removal of intrauterine contraceptive device: Secondary | ICD-10-CM | POA: Diagnosis not present
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
## 2015-10-06 ENCOUNTER — Encounter: Payer: Self-pay | Admitting: Family Medicine

## 2015-10-06 ENCOUNTER — Ambulatory Visit (INDEPENDENT_AMBULATORY_CARE_PROVIDER_SITE_OTHER): Payer: Medicare Other | Admitting: Family Medicine

## 2015-10-06 VITALS — BP 145/119 | HR 74 | Temp 98.5°F | Ht 65.0 in | Wt 249.0 lb

## 2015-10-06 DIAGNOSIS — N912 Amenorrhea, unspecified: Secondary | ICD-10-CM

## 2015-10-06 DIAGNOSIS — Z23 Encounter for immunization: Secondary | ICD-10-CM

## 2015-10-06 LAB — POCT URINE PREGNANCY: Preg Test, Ur: NEGATIVE

## 2015-10-06 MED ORDER — PRENATAL VITAMINS PLUS 27-1 MG PO TABS: 1.0000 | ORAL_TABLET | Freq: Every day | ORAL | Status: DC

## 2015-10-06 NOTE — Patient Instructions (Addendum)
Your pregnancy test today was negative.  If you want to, you can use the over the counter pregnancy tests yourself at home, you do not necessarily need to come to the clinic for this. I would recommended the [employer] or [employer]/dollar store tests since they are cheaper and should be just as good as the more expensive brand tests.  Start taking a prenatal vitamin today! Get this at any pharmacy. It is important to make sure it has folic acid (folate), at least 400 ug. Take the prenatal vitamin daily.  Avoid smoking, alcohol, any drugs not prescribed and recommended by your doctors.   Should I see a doctor before I try to get pregnant? - Yes. It's very important that you see your doctor or nurse for a "pre-pregnancy check-up." Your doctor or nurse will ask you about things that could affect your pregnancy. For instance, he or she might ask about your diet, lifestyle, use of birth control, past pregnancies, medicines, and any diseases that you have or that run in your family.  There are several things that you and your doctor or nurse can do to make sure that your pregnancy is as healthy as possible. These things should be done BEFORE you try to get pregnant:  ?Discuss any medicines or herbal drugs you take and find out if you need to make changes ?Discuss whether you are up-to-date on your vaccines ?Start taking a multivitamin that has folic acid (also called folate) ?Know which foods you should avoid and which foods are best ?Stop smoking, drinking alcohol, or taking drugs not prescribed for you by a doctor ?Understand the risks to you and your baby if: .You have any medical conditions .There are diseases that run in your family or your partner's family ?Discuss whether there are any harmful substances in your home or work ?Try to reach a healthy weight Each of these issues is explained in more detail below.  Ask if the medicines you take are safe - If you take any medicines,  supplements, or herbal drugs, ask your doctor if it is safe to keep taking them while you are pregnant or trying to get pregnant. Some medicines take a long time to leave your body completely, so it's important to plan ahead. In some cases, your doctor and nurse will want you to switch to different medicines that are safer for the baby. Your doctor and nurse might need to slowly get you off some medicines because it could harm you to stop them all of a sudden. This is especially important for women who take medicines to treat seizures, high blood pressure, lupus, and rheumatoid arthritis.  Check if you need any vaccines - Women who want to get pregnant should be up-to-date on their vaccines. This includes vaccines against measles, mumps, rubella, tetanus, diphtheria, polio, chickenpox (also called varicella), and possibly hepatitis. Many women got these vaccines as children. Still, it is important to check that you have had all the right vaccines. Otherwise, you could get sick with the diseases the vaccines protect against, and that could cause problems for you or your baby. All women should also get a flu shot every year.  Some vaccines cannot be given during pregnancy or in the month before pregnancy. It's important to get these vaccines more than a month before you start trying to get pregnant.  Start taking a multivitamin - If you want to get pregnant, take a "prenatal" multivitamin every day that has at least 400 micrograms of  folic acid. This helps prevent some birth defects. Start taking the multivitamin at least a month before you start trying to get pregnant. It's not enough to start taking vitamins when you find out you are pregnant. At that point, your baby has already formed many body parts that rely on folic acid and other vitamins to develop normally.  It is important not to take too much of any vitamin during pregnancy, especially vitamin A. Show your doctor or nurse the vitamins you plan to  take to make sure the doses are safe for you and your baby.  Check your diet - Some foods are not safe for a woman who is pregnant or trying to get pregnant. If you are trying to get pregnant, do not eat raw or undercooked meat. Avoid eating shark, swordfish, king mackerel, or tilefish because they can have high levels of mercury. Check with your doctor or nurse about the safety of fish caught in local rivers and lakes. Limit the amount of caffeine you have by not drinking more than 1 or 2 cups of coffee each day. Tea and cola also contain caffeine, but usually not as much as coffee. Try to eat a balanced diet rich in fruits, vegetables, and whole grains. Wash fruits and vegetables before eating them.  Stop smoking, drinking alcohol, or taking illegal drugs - If you smoke, drink alcohol, or take drugs not prescribed for you by a doctor, now more than ever it is important that you stop. Using even small amounts of these substances from time to time during pregnancy could hurt your baby.  It's not enough to stop as soon as you find out you are pregnant. By then the baby has already begun to form and could get damaged by smoking, alcohol, or drugs. If you need help quitting, speak with your doctor or nurse. There are effective treatments that can help.  Your partner should also stop smoking and using illegal drugs. He should not drink too much alcohol.  Ask about risks - Ask your doctor what the risks to you and your baby might be if:  ?You have any medical conditions - If you have a medical problem, it could cause problems for you or your baby during pregnancy. Women who have certain medical conditions should work with their doctor to get their conditions under control before they get pregnant. This includes women with diabetes, high blood pressure, asthma, thyroid conditions, seizure disorders, HIV infection, and other problems. If these conditions are not well controlled, they can cause problems for a  mother and her baby during pregnancy. ?You or your partner has a family history of a medical condition - If you or your partner has a history of a condition that could be passed on to your baby, you might need genetic counseling. Genetic counseling can help you find out what the chances are that your baby will have the condition. It will also help you sort out what your options might be if your baby does have problems. Examples of conditions that might call for genetic counseling include cystic fibrosis, mental retardation, and muscular dystrophy. Check your home and work for harmful substances - People often have chemicals or substances in their home or work that could hurt an unborn baby. Dealing with these substances can sometimes be complicated and time consuming, so it's important to plan ahead. For instance, people who live in homes built before 1978 often have lead paint on their walls or woodwork. Lead in chips or  dust from this paint could harm a baby. Ask your doctor or nurse how to deal with this and other harmful substances you might have around you.  Work on your [employer] - Women who weigh too little or too much can have problems getting pregnant and problems during pregnancy. You should try to reach a healthy weight before you try to get pregnant.

## 2015-10-06 NOTE — Progress Notes (Signed)
   Subjective:    Patient ID: Allison Moore, female    DOB: Feb 04, 1981, 34 y.o.   MRN: [id number]  HPI  CC: pregnancy test  # Pregnancy planning:  Had IUD removed 11 days ago  Planning on getting pregnant, has had sex since then. Wants pregnancy test  Has not started her prenatal vitamin  Does complain of some abdominal pain since the IUD removal, along with diarrhea, no fevers.  Not currently taking any medicines (denies using risperdal)  Social Hx: current some tobacco smoker  Review of Systems   See HPI for ROS.   Past medical history, surgical, family, and social history reviewed and updated in the EMR as appropriate. Objective:  BP 145/119 mmHg  Pulse 74  Temp(Src) 98.5 F (36.9 C) (Oral)  Ht 5\' 5"  (1.651 m)  Wt 249 lb (112.946 kg)  BMI 41.44 kg/m2 Vitals and nursing note reviewed  General: NAD, quiet Abdomen: obese, soft, nontender, nondistended, no rebound or guarding Neuro: alert and oriented Psych: quiet, normal thought content/speech  Assessment & Plan:  1. Amenorrhea Planning on getting pregnant. Negative UPT in clinic today (counseled regarding very short time after removing IUD --- if pregnant urine test may not show yet). Recommended home pregnancy testing if desires. Also counseled regarding starting prenatal vitamin today. She has some vague complaint of abdominal pain -- wonder if this is related to IUD removal since that is when it started -- overall exam reassuring and recommended to RTC if not improving over next week or if worsens. Flu shot given today - POCT urine pregnancy

## 2015-11-26 ENCOUNTER — Telehealth: Payer: Self-pay | Admitting: Family Medicine

## 2015-11-26 ENCOUNTER — Ambulatory Visit: Payer: Medicare Other | Admitting: Family Medicine

## 2015-11-26 NOTE — Telephone Encounter (Signed)
Pt would like for form to be completed for her to have a home nurse. Pt has never seen Dr. Jonathon Jordan. I informed pt that she will likely need an appt before this could be completed. Pt pressed anyway. Sadie Reynolds, ASA

## 2015-11-27 NOTE — Telephone Encounter (Signed)
Form placed in PCP box for completion. Zimmerman Rumple, April D, CMA  

## 2015-12-01 NOTE — Telephone Encounter (Addendum)
Called patient, left a voicemail to call back to discuss the home nurse form. Will need to see patient in clinic, if we could schedule an appointment for her that would be ideal. Thank you.   Anders Simmonds, MD [facility], PGY-1

## 2015-12-09 ENCOUNTER — Ambulatory Visit: Payer: Self-pay | Admitting: Student

## 2015-12-12 ENCOUNTER — Ambulatory Visit: Payer: Commercial Managed Care - HMO | Admitting: Student

## 2015-12-15 ENCOUNTER — Encounter: Payer: Self-pay | Admitting: Family Medicine

## 2015-12-15 ENCOUNTER — Ambulatory Visit (INDEPENDENT_AMBULATORY_CARE_PROVIDER_SITE_OTHER): Payer: Commercial Managed Care - HMO | Admitting: Family Medicine

## 2015-12-15 ENCOUNTER — Ambulatory Visit: Payer: Commercial Managed Care - HMO | Admitting: Student

## 2015-12-15 VITALS — BP 124/58 | HR 92 | Temp 98.9°F | Ht 65.0 in | Wt 245.3 lb

## 2015-12-15 DIAGNOSIS — Z3200 Encounter for pregnancy test, result unknown: Secondary | ICD-10-CM | POA: Diagnosis not present

## 2015-12-15 LAB — POCT URINE PREGNANCY: Preg Test, Ur: NEGATIVE

## 2015-12-15 NOTE — Patient Instructions (Signed)
Thank you for coming in,   Please practice safe sex.   Sign up for My Chart to have easy access to your labs results, and communication with your Primary care physician   Please feel free to call with any questions or concerns at any time, at [phone]. --Dr. Jordan Likes

## 2015-12-15 NOTE — Progress Notes (Signed)
   Subjective:    Patient ID: Allison Moore, female    DOB: 1980-11-28, 35 y.o.   MRN: [id number]  Seen for Same day visit for   CC: possible pregnancy   Has had multiple instances of unprotected intercourse.  She missed her cycle last month.  She had one partner in the six months.  No nausea or vomiting  Has tried home pregnancy test but was not conclusive.  She was trying to get pregnant.  No vaginal discharge and no change of having STD.   PMH: MS SH: tobacco user (1 PPD) reports she will quit if pregnant.    Review of Systems   See HPI for ROS. Objective:  BP 124/58 mmHg  Pulse 92  Temp(Src) 98.9 F (37.2 C) (Oral)  Ht  (1.651 m)  Wt 245 lb 4.8 oz (111.267 kg)  BMI 40.82 kg/m2  LMP 11/26/2015  General: NAD Cardiac: RRR, normal heart sounds Respiratory: CTAB, normal effort Abdomen: soft, nontender, nondistended, no hepatic or splenomegaly. Bowel sounds present    Assessment & Plan:   Possible pregnancy Pregnancy test negative today. - Advised a safe sex practices - Follow-up as needed

## 2015-12-16 DIAGNOSIS — Z3200 Encounter for pregnancy test, result unknown: Secondary | ICD-10-CM | POA: Insufficient documentation

## 2015-12-16 NOTE — Assessment & Plan Note (Signed)
Pregnancy test negative today. - Advised a safe sex practices - Follow-up as needed

## 2015-12-30 NOTE — Telephone Encounter (Signed)
Pt has appointment scheduled for this on 01/19/16 with Dr. Jonathon Jordan. Lamonte Sakai, Gideon Burstein D, [place]

## 2016-01-05 ENCOUNTER — Encounter: Payer: Self-pay | Admitting: Internal Medicine

## 2016-01-19 ENCOUNTER — Ambulatory Visit: Payer: Self-pay | Admitting: Family Medicine

## 2016-02-04 ENCOUNTER — Ambulatory Visit: Payer: Medicare Other | Admitting: Internal Medicine

## 2016-02-09 ENCOUNTER — Ambulatory Visit: Payer: Self-pay | Admitting: Family Medicine

## 2016-02-17 ENCOUNTER — Emergency Department (HOSPITAL_COMMUNITY)
Admission: EM | Admit: 2016-02-17 | Discharge: 2016-02-18 | Disposition: A | Discharge status: Home / Self Care | Payer: Commercial Managed Care - HMO | Attending: Emergency Medicine | Admitting: Emergency Medicine

## 2016-02-17 ENCOUNTER — Encounter (HOSPITAL_COMMUNITY): Payer: Self-pay | Admitting: *Deleted

## 2016-02-17 DIAGNOSIS — Z8781 Personal history of (healed) traumatic fracture: Secondary | ICD-10-CM | POA: Insufficient documentation

## 2016-02-17 DIAGNOSIS — Z8742 Personal history of other diseases of the female genital tract: Secondary | ICD-10-CM | POA: Insufficient documentation

## 2016-02-17 DIAGNOSIS — F259 Schizoaffective disorder, unspecified: Secondary | ICD-10-CM | POA: Diagnosis not present

## 2016-02-17 DIAGNOSIS — F319 Bipolar disorder, unspecified: Secondary | ICD-10-CM | POA: Diagnosis not present

## 2016-02-17 DIAGNOSIS — F17210 Nicotine dependence, cigarettes, uncomplicated: Secondary | ICD-10-CM | POA: Diagnosis not present

## 2016-02-17 DIAGNOSIS — Z3202 Encounter for pregnancy test, result negative: Secondary | ICD-10-CM | POA: Insufficient documentation

## 2016-02-17 DIAGNOSIS — R1032 Left lower quadrant pain: Secondary | ICD-10-CM | POA: Diagnosis not present

## 2016-02-17 DIAGNOSIS — Z87440 Personal history of urinary (tract) infections: Secondary | ICD-10-CM | POA: Diagnosis not present

## 2016-02-17 DIAGNOSIS — D62 Acute posthemorrhagic anemia: Secondary | ICD-10-CM | POA: Diagnosis not present

## 2016-02-17 DIAGNOSIS — F419 Anxiety disorder, unspecified: Secondary | ICD-10-CM | POA: Diagnosis not present

## 2016-02-17 DIAGNOSIS — Z79899 Other long term (current) drug therapy: Secondary | ICD-10-CM | POA: Diagnosis not present

## 2016-02-17 DIAGNOSIS — G35 Multiple sclerosis: Secondary | ICD-10-CM | POA: Insufficient documentation

## 2016-02-17 DIAGNOSIS — E669 Obesity, unspecified: Secondary | ICD-10-CM | POA: Insufficient documentation

## 2016-02-17 DIAGNOSIS — R112 Nausea with vomiting, unspecified: Secondary | ICD-10-CM | POA: Insufficient documentation

## 2016-02-17 DIAGNOSIS — F99 Mental disorder, not otherwise specified: Secondary | ICD-10-CM | POA: Insufficient documentation

## 2016-02-17 DIAGNOSIS — Z8739 Personal history of other diseases of the musculoskeletal system and connective tissue: Secondary | ICD-10-CM | POA: Diagnosis not present

## 2016-02-17 DIAGNOSIS — R1031 Right lower quadrant pain: Secondary | ICD-10-CM | POA: Diagnosis not present

## 2016-02-17 DIAGNOSIS — R102 Pelvic and perineal pain: Secondary | ICD-10-CM | POA: Insufficient documentation

## 2016-02-17 DIAGNOSIS — R109 Unspecified abdominal pain: Secondary | ICD-10-CM | POA: Diagnosis present

## 2016-02-17 LAB — CBC
HCT: 39.1 % (ref 36.0–46.0)
Hemoglobin: 12.7 g/dL (ref 12.0–15.0)
MCH: 28.9 pg (ref 26.0–34.0)
MCHC: 32.5 g/dL (ref 30.0–36.0)
MCV: 88.9 fL (ref 78.0–100.0)
Platelets: 368 10*3/uL (ref 150–400)
RBC: 4.4 MIL/uL (ref 3.87–5.11)
RDW: 12.9 % (ref 11.5–15.5)
WBC: 5.7 10*3/uL (ref 4.0–10.5)

## 2016-02-17 LAB — COMPREHENSIVE METABOLIC PANEL
ALT: 18 U/L (ref 14–54)
AST: 19 U/L (ref 15–41)
Albumin: 3.6 g/dL (ref 3.5–5.0)
Alkaline Phosphatase: 57 U/L (ref 38–126)
Anion gap: 9 (ref 5–15)
BUN: 11 mg/dL (ref 6–20)
CHLORIDE: 105 mmol/L (ref 101–111)
CO2: 25 mmol/L (ref 22–32)
Calcium: 9.3 mg/dL (ref 8.9–10.3)
Creatinine, Ser: 0.72 mg/dL (ref 0.44–1.00)
GFR calc Af Amer: >60 mL/min (ref >60)
GFR calc non Af Amer: >60 mL/min (ref >60)
Glucose, Bld: 105 mg/dL — ABNORMAL HIGH (ref 65–99)
POTASSIUM: 3.8 mmol/L (ref 3.5–5.1)
Sodium: 139 mmol/L (ref 135–145)
Total Bilirubin: 0.4 mg/dL (ref 0.3–1.2)
Total Protein: 6.5 g/dL (ref 6.5–8.1)

## 2016-02-17 LAB — URINALYSIS, ROUTINE W REFLEX MICROSCOPIC
Bilirubin Urine: NEGATIVE
GLUCOSE, UA: NEGATIVE mg/dL
Ketones, ur: NEGATIVE mg/dL
LEUKOCYTES UA: NEGATIVE
Nitrite: NEGATIVE
PH: 6 (ref 5.0–8.0)
PROTEIN: NEGATIVE mg/dL
Specific Gravity, Urine: 1.003 — ABNORMAL LOW (ref 1.005–1.030)

## 2016-02-17 LAB — WET PREP, GENITAL
Sperm: NONE SEEN
TRICH WET PREP: NONE SEEN
Yeast Wet Prep HPF POC: NONE SEEN

## 2016-02-17 LAB — URINE MICROSCOPIC-ADD ON

## 2016-02-17 LAB — POC URINE PREG, ED: Preg Test, Ur: NEGATIVE

## 2016-02-17 LAB — LIPASE, BLOOD: LIPASE: 31 U/L (ref 11–51)

## 2016-02-17 MED ORDER — METRONIDAZOLE 500 MG PO TABS: 500.0000 mg | ORAL_TABLET | Freq: Two times a day (BID) | ORAL | Status: DC
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
## 2016-02-17 NOTE — Discharge Instructions (Signed)

## 2016-02-17 NOTE — ED Notes (Signed)
The pt is c/o abd pain vomitng she thinks she is preg   lmp one month ago

## 2016-02-18 LAB — GC/CHLAMYDIA PROBE AMP (~~LOC~~) NOT AT ARMC
Chlamydia: NEGATIVE
NEISSERIA GONORRHEA: NEGATIVE

## 2016-02-18 LAB — HIV ANTIBODY (ROUTINE TESTING W REFLEX): HIV Screen 4th Generation wRfx: NONREACTIVE

## 2016-02-18 LAB — RPR: RPR: NONREACTIVE

## 2016-03-10 ENCOUNTER — Ambulatory Visit: Payer: Self-pay | Admitting: Family Medicine

## 2016-03-10 ENCOUNTER — Encounter (HOSPITAL_COMMUNITY): Payer: Self-pay | Admitting: Emergency Medicine

## 2016-03-10 ENCOUNTER — Ambulatory Visit (HOSPITAL_COMMUNITY)
Admission: EM | Admit: 2016-03-10 | Discharge: 2016-03-10 | Discharge status: Against Medical Advice | Payer: Commercial Managed Care - HMO | Attending: Emergency Medicine | Admitting: Emergency Medicine

## 2016-03-10 DIAGNOSIS — R112 Nausea with vomiting, unspecified: Secondary | ICD-10-CM | POA: Diagnosis not present

## 2016-03-10 DIAGNOSIS — R1013 Epigastric pain: Secondary | ICD-10-CM

## 2016-03-10 DIAGNOSIS — R350 Frequency of micturition: Secondary | ICD-10-CM

## 2016-03-10 NOTE — ED Notes (Signed)
Patient stated that she could not stay any longer and could not urinate at this time. She stated that she has MS and it is not unusual for her to take some length of time in order to urinate. The patient was given a sterile labeled specimen cup and given instructions that she could return tomorrow if she desired. AMA paperwork completed and signed.

## 2016-03-10 NOTE — ED Notes (Signed)
Patient still can not void at this time 

## 2016-03-10 NOTE — ED Notes (Signed)
The patient presented to the [facility] with a complaint of upper abdominal pain that she described as sharp that started about a week ago.
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
## 2016-03-10 NOTE — ED Notes (Signed)
Sent patient to obtain urine specimen, patient returned and stated that she couldn't pee, requested to be cathed for the pregnancy test

## 2016-03-10 NOTE — ED Notes (Signed)
Patient still unable to void.

## 2016-03-11 DIAGNOSIS — M25561 Pain in right knee: Secondary | ICD-10-CM | POA: Diagnosis not present

## 2016-03-11 DIAGNOSIS — T148 Other injury of unspecified body region: Secondary | ICD-10-CM | POA: Diagnosis not present

## 2016-03-12 ENCOUNTER — Emergency Department (HOSPITAL_COMMUNITY): Payer: Commercial Managed Care - HMO

## 2016-03-12 ENCOUNTER — Emergency Department (HOSPITAL_COMMUNITY)
Admission: EM | Admit: 2016-03-12 | Discharge: 2016-03-12 | Disposition: A | Discharge status: Home / Self Care | Payer: Commercial Managed Care - HMO | Attending: Emergency Medicine | Admitting: Emergency Medicine

## 2016-03-12 ENCOUNTER — Encounter (HOSPITAL_COMMUNITY): Payer: Self-pay | Admitting: *Deleted

## 2016-03-12 DIAGNOSIS — M79661 Pain in right lower leg: Secondary | ICD-10-CM | POA: Diagnosis not present

## 2016-03-12 DIAGNOSIS — F17210 Nicotine dependence, cigarettes, uncomplicated: Secondary | ICD-10-CM | POA: Diagnosis not present

## 2016-03-12 DIAGNOSIS — T148XXA Other injury of unspecified body region, initial encounter: Secondary | ICD-10-CM

## 2016-03-12 DIAGNOSIS — S8011XA Contusion of right lower leg, initial encounter: Secondary | ICD-10-CM | POA: Diagnosis not present

## 2016-03-12 DIAGNOSIS — S8001XA Contusion of right knee, initial encounter: Secondary | ICD-10-CM | POA: Diagnosis not present

## 2016-03-12 DIAGNOSIS — Y929 Unspecified place or not applicable: Secondary | ICD-10-CM | POA: Diagnosis not present

## 2016-03-12 DIAGNOSIS — S60551A Superficial foreign body of right hand, initial encounter: Secondary | ICD-10-CM | POA: Insufficient documentation

## 2016-03-12 DIAGNOSIS — Y939 Activity, unspecified: Secondary | ICD-10-CM | POA: Diagnosis not present

## 2016-03-12 DIAGNOSIS — S60921A Unspecified superficial injury of right hand, initial encounter: Secondary | ICD-10-CM | POA: Diagnosis present

## 2016-03-12 DIAGNOSIS — S60511A Abrasion of right hand, initial encounter: Secondary | ICD-10-CM | POA: Insufficient documentation

## 2016-03-12 DIAGNOSIS — F319 Bipolar disorder, unspecified: Secondary | ICD-10-CM | POA: Insufficient documentation

## 2016-03-12 DIAGNOSIS — F259 Schizoaffective disorder, unspecified: Secondary | ICD-10-CM | POA: Insufficient documentation

## 2016-03-12 DIAGNOSIS — S8991XA Unspecified injury of right lower leg, initial encounter: Secondary | ICD-10-CM | POA: Diagnosis not present

## 2016-03-12 DIAGNOSIS — E669 Obesity, unspecified: Secondary | ICD-10-CM | POA: Diagnosis not present

## 2016-03-12 DIAGNOSIS — Z79899 Other long term (current) drug therapy: Secondary | ICD-10-CM | POA: Diagnosis not present

## 2016-03-12 DIAGNOSIS — S61442A Puncture wound with foreign body of left hand, initial encounter: Secondary | ICD-10-CM | POA: Diagnosis not present

## 2016-03-12 DIAGNOSIS — Y999 Unspecified external cause status: Secondary | ICD-10-CM | POA: Diagnosis not present

## 2016-03-12 DIAGNOSIS — E785 Hyperlipidemia, unspecified: Secondary | ICD-10-CM | POA: Diagnosis not present

## 2016-03-12 DIAGNOSIS — S60512A Abrasion of left hand, initial encounter: Secondary | ICD-10-CM | POA: Diagnosis not present

## 2016-03-12 LAB — POC URINE PREG, ED: PREG TEST UR: NEGATIVE

## 2016-03-12 MED ORDER — TETANUS-DIPHTH-ACELL PERTUSSIS 5-2.5-18.5 LF-MCG/0.5 IM SUSP
0.5000 mL | Freq: Once | INTRAMUSCULAR | Status: AC
  Administered 2016-03-12: 0.5 mL via INTRAMUSCULAR
  Filled 2016-03-12: qty 0.5

## 2016-03-12 MED ORDER — IBUPROFEN 200 MG PO TABS
600.0000 mg | ORAL_TABLET | Freq: Once | ORAL | Status: AC
  Administered 2016-03-12: 600 mg via ORAL
  Filled 2016-03-12: qty 3

## 2016-03-12 MED ORDER — BACITRACIN ZINC 500 UNIT/GM EX OINT
TOPICAL_OINTMENT | Freq: Two times a day (BID) | CUTANEOUS | Status: DC
  Administered 2016-03-12: 1 via TOPICAL
  Filled 2016-03-12: qty 0.9

## 2016-03-12 MED ORDER — IBUPROFEN 600 MG PO TABS: 600.0000 mg | ORAL_TABLET | Freq: Three times a day (TID) | ORAL | Status: DC
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
## 2016-03-12 NOTE — Discharge Instructions (Signed)
X-ray reveals that you do not have any broken bones your superficial abrasion/laceration on your great toe has been cleaned and dressed.  Keep the dressing in place for 24 hours, then you may remove for Rocephin water and apply a small Band-Aid as needed.  The splinters or foreign bodies have been removed from the palm of the left hand , this will not need any further treatment.  Please make an appointment with your primary care physician for follow-up as needed

## 2016-03-12 NOTE — ED Notes (Signed)
Pt transported to XRay 

## 2016-03-15 ENCOUNTER — Telehealth: Payer: Self-pay | Admitting: Family Medicine

## 2016-03-15 NOTE — Telephone Encounter (Signed)
Called patient back to clarify her requests. Patient states her social security is in her father's name and she would like it in her name so she can claim the benefits and that is why she wants a letter to say she is mentally and physically capable of handling her own financial affairs. Informed patient that is something she will need to talk to the social security department about. Additionally, I have never met the patient so I advised her to schedule an appointment with me in the near future.

## 2016-03-15 NOTE — Telephone Encounter (Signed)
Need statement from provider that patient is capable mentally and physically to handle her own financial affairs and that she need her social security benefits to be placed in her name only.

## 2016-03-24 ENCOUNTER — Telehealth: Payer: Self-pay | Admitting: Neurology

## 2016-03-24 NOTE — Telephone Encounter (Signed)
Message was sent to Dewitt Hoes, practice administrator to address.

## 2016-03-24 NOTE — Telephone Encounter (Signed)
Message For: [facility]                  Taken 31-MAY-17 at  1:44PM by LSJ ------------------------------------------------------------  Allison Moore [facility]          CID  [id number]   Patient  SAME                  Pt's Dr  Marylou Flesher       Area Code  336  Phone#  [phone] *  DOB  [birth year]       RE  FEELING WEAK/SHE NEEDS HER MEDS/PLS CALL                                                                Disp:Y/N  N  If Y = C/B If No Response In  ============================================================

## 2016-03-25 ENCOUNTER — Ambulatory Visit (INDEPENDENT_AMBULATORY_CARE_PROVIDER_SITE_OTHER): Payer: Commercial Managed Care - HMO | Admitting: Family Medicine

## 2016-03-25 ENCOUNTER — Encounter: Payer: Self-pay | Admitting: Family Medicine

## 2016-03-25 VITALS — BP 122/108 | HR 93 | Temp 98.3°F | Ht 65.0 in | Wt 242.0 lb

## 2016-03-25 DIAGNOSIS — Z3200 Encounter for pregnancy test, result unknown: Secondary | ICD-10-CM | POA: Diagnosis not present

## 2016-03-25 DIAGNOSIS — G35 Multiple sclerosis: Secondary | ICD-10-CM | POA: Diagnosis not present

## 2016-03-25 LAB — POCT URINE PREGNANCY: Preg Test, Ur: NEGATIVE
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
## 2016-03-25 NOTE — Assessment & Plan Note (Signed)
Reports she is out of Copaxone due to not being refilled by neurologist due to outstanding debt.  She reports she will be able to pay this balance tomorrow.  - Advised her to have neurologist refill Copaxone after she has reestablish care with them; she will call let us know.  She is unable to do this and we may be able to refer her to a new neurologist

## 2016-03-25 NOTE — Patient Instructions (Addendum)
Please call and discuss payment options with your neurologist to reestablish care.  Please let us know if you're unable to reestablish care and we will refer you to another neurologist.  - I strongly urge you to consider birth control until you're able to establish with neurology and have your multiple sclerosis under control.  - I recommend you take prenatal vitamins daily  Follow up with your primary care doctor in the next 1-2 weeks as needed to discuss birth control

## 2016-03-25 NOTE — Telephone Encounter (Signed)
Pt called in requesting refill on COPAXONE 40 MG/ML SOSY. I notified pt that she has been dismissed from our practice and gave the date which it happened. Phone call was dropped.

## 2016-03-25 NOTE — Assessment & Plan Note (Signed)
Missed period x 1 week.  Urine pregnancy test negative.  - Recommended birth control while reestablish with neurology for multiple sclerosis.  Patient declined - Recommended testing for STDs given sexually active without condom use.  Patient declined

## 2016-04-05 ENCOUNTER — Telehealth: Payer: Self-pay

## 2016-04-05 DIAGNOSIS — G35 Multiple sclerosis: Secondary | ICD-10-CM

## 2016-04-05 NOTE — Telephone Encounter (Signed)
Patient requesting referral back to [facility].

## 2016-04-06 ENCOUNTER — Emergency Department (HOSPITAL_COMMUNITY)
Admission: EM | Admit: 2016-04-06 | Discharge: 2016-04-06 | Disposition: A | Discharge status: Home / Self Care | Payer: Commercial Managed Care - HMO | Attending: Emergency Medicine | Admitting: Emergency Medicine

## 2016-04-06 ENCOUNTER — Encounter (HOSPITAL_COMMUNITY): Payer: Self-pay

## 2016-04-06 DIAGNOSIS — N764 Abscess of vulva: Secondary | ICD-10-CM

## 2016-04-06 DIAGNOSIS — F319 Bipolar disorder, unspecified: Secondary | ICD-10-CM | POA: Insufficient documentation

## 2016-04-06 DIAGNOSIS — F17210 Nicotine dependence, cigarettes, uncomplicated: Secondary | ICD-10-CM | POA: Insufficient documentation

## 2016-04-06 DIAGNOSIS — E785 Hyperlipidemia, unspecified: Secondary | ICD-10-CM | POA: Insufficient documentation

## 2016-04-06 LAB — URINALYSIS, ROUTINE W REFLEX MICROSCOPIC
Bilirubin Urine: NEGATIVE
Glucose, UA: NEGATIVE mg/dL
Ketones, ur: NEGATIVE mg/dL
Leukocytes, UA: NEGATIVE
Nitrite: NEGATIVE
Protein, ur: NEGATIVE mg/dL
Specific Gravity, Urine: 1.023 (ref 1.005–1.030)
pH: 6 (ref 5.0–8.0)

## 2016-04-06 LAB — URINE MICROSCOPIC-ADD ON

## 2016-04-06 LAB — PREGNANCY, URINE: Preg Test, Ur: NEGATIVE

## 2016-04-06 MED ORDER — LIDOCAINE-PRILOCAINE 2.5-2.5 % EX CREA
TOPICAL_CREAM | Freq: Once | CUTANEOUS | Status: DC
  Filled 2016-04-06 (×2): qty 5

## 2016-04-06 MED ORDER — LORAZEPAM 1 MG PO TABS
1.0000 mg | ORAL_TABLET | Freq: Once | ORAL | Status: AC
  Administered 2016-04-06: 1 mg via ORAL
  Filled 2016-04-06: qty 1

## 2016-04-06 MED ORDER — LIDOCAINE HCL (PF) 1 % IJ SOLN
30.0000 mL | Freq: Once | INTRAMUSCULAR | Status: AC
  Administered 2016-04-06: 30 mL
  Filled 2016-04-06: qty 30
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
## 2016-04-06 NOTE — Telephone Encounter (Signed)
Referral to neurology placed for hx of MS

## 2016-04-06 NOTE — ED Notes (Signed)
Patient complains of vaginal abscess x 2 days, also thinks she is pregnant

## 2016-04-06 NOTE — ED Notes (Signed)
Lab called sts urine was sent down. RN requested to hold urine in case provider orders further work up when seen.

## 2016-04-06 NOTE — Discharge Instructions (Signed)
Incision and Drainage Incision and drainage is a procedure in which a sac-like structure (cystic structure) is opened and drained. The area to be drained usually contains material such as pus, fluid, or blood.  LET YOUR CAREGIVER KNOW ABOUT:   Allergies to medicine.  Medicines taken, including vitamins, herbs, eyedrops, over-the-counter medicines, and creams.  Use of steroids (by mouth or creams).  Previous problems with anesthetics or numbing medicines.  History of bleeding problems or blood clots.  Previous surgery.  Other health problems, including diabetes and kidney problems.  Possibility of pregnancy, if this applies. RISKS AND COMPLICATIONS  Pain.  Bleeding.  Scarring.  Infection. BEFORE THE PROCEDURE  You may need to have an ultrasound or other imaging tests to see how large or deep your cystic structure is. Blood tests may also be used to determine if you have an infection or how severe the infection is. You may need to have a tetanus shot. PROCEDURE  The affected area is cleaned with a cleaning fluid. The cyst area will then be numbed with a medicine (local anesthetic). A small incision will be made in the cystic structure. A syringe or catheter may be used to drain the contents of the cystic structure, or the contents may be squeezed out. The area will then be flushed with a cleansing solution. After cleansing the area, it is often gently packed with a gauze or another wound dressing. Once it is packed, it will be covered with gauze and tape or some other type of wound dressing. AFTER THE PROCEDURE   Often, you will be allowed to go home right after the procedure.  You may be given antibiotic medicine to prevent or heal an infection.  If the area was packed with gauze or some other wound dressing, you will likely need to come back in 1 to 2 days to get it removed.  The area should heal in about 14 days.   This information is not intended to replace advice given  to you by your health care provider. Make sure you discuss any questions you have with your health care provider.  You will need to follow up with a neurologist as soon as possible for re-evaluation and for medication management. Please also follow up with your primary care doctor in 3-4 days for a wound re-check. Your wound will continue to drain. Avoid shaving that area until wound has healed. Take ibuprofen as needed for pain. Return to the ED if you experience severe worsening of your symptoms, fevers, chills, increased redness or swelling around your wound.

## 2016-04-08 ENCOUNTER — Ambulatory Visit: Payer: Self-pay | Admitting: Family Medicine

## 2016-04-21 ENCOUNTER — Encounter (HOSPITAL_COMMUNITY): Payer: Self-pay | Admitting: Emergency Medicine

## 2016-04-21 ENCOUNTER — Ambulatory Visit (HOSPITAL_COMMUNITY)
Admission: EM | Admit: 2016-04-21 | Discharge: 2016-04-21 | Disposition: A | Discharge status: Home / Self Care | Payer: Commercial Managed Care - HMO | Attending: Family Medicine | Admitting: Family Medicine

## 2016-04-21 DIAGNOSIS — L0231 Cutaneous abscess of buttock: Secondary | ICD-10-CM

## 2016-04-21 MED ORDER — LIDOCAINE-EPINEPHRINE (PF) 2 %-1:200000 IJ SOLN
INTRAMUSCULAR | Status: AC
  Filled 2016-04-21: qty 20

## 2016-04-21 MED ORDER — CLINDAMYCIN HCL 150 MG PO CAPS: 150.0000 mg | ORAL_CAPSULE | Freq: Four times a day (QID) | ORAL | Status: DC
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
## 2016-04-21 NOTE — ED Notes (Signed)
The patient presented to the [facility] with a complaint of a possible boil on her buttocks for 2 days.

## 2016-04-21 NOTE — Discharge Instructions (Signed)

## 2016-05-28 ENCOUNTER — Ambulatory Visit: Payer: Self-pay | Admitting: Neurology

## 2016-05-28 DIAGNOSIS — Z029 Encounter for administrative examinations, unspecified: Secondary | ICD-10-CM

## 2016-06-02 ENCOUNTER — Ambulatory Visit: Payer: Medicare Other | Admitting: Internal Medicine

## 2016-06-02 ENCOUNTER — Ambulatory Visit: Payer: Self-pay | Admitting: Student

## 2016-06-03 ENCOUNTER — Encounter: Payer: Self-pay | Admitting: Internal Medicine

## 2016-06-03 ENCOUNTER — Ambulatory Visit (INDEPENDENT_AMBULATORY_CARE_PROVIDER_SITE_OTHER): Payer: Commercial Managed Care - HMO | Admitting: Internal Medicine

## 2016-06-03 VITALS — BP 114/93 | HR 92 | Temp 98.8°F | Ht 65.0 in | Wt 239.8 lb

## 2016-06-03 DIAGNOSIS — J309 Allergic rhinitis, unspecified: Secondary | ICD-10-CM | POA: Diagnosis not present

## 2016-06-03 DIAGNOSIS — N912 Amenorrhea, unspecified: Secondary | ICD-10-CM | POA: Diagnosis not present

## 2016-06-03 DIAGNOSIS — G35 Multiple sclerosis: Secondary | ICD-10-CM

## 2016-06-03 LAB — POCT URINE PREGNANCY: Preg Test, Ur: NEGATIVE

## 2016-06-03 MED ORDER — FLUTICASONE PROPIONATE 50 MCG/ACT NA SUSP: 2.0000 | Freq: Every day | NASAL | 6 refills | Status: DC

## 2016-06-03 MED ORDER — LORATADINE 10 MG PO TABS: 10.0000 mg | ORAL_TABLET | Freq: Every day | ORAL | 11 refills | Status: DC

## 2016-06-03 NOTE — Assessment & Plan Note (Signed)
Patient over a week late for menstrual period. Upreg negative today. Given unprotected sexual intercourse within the last two weeks, will need second Upreg. Declines contraception.  -Upreg on 8/15, patient to make lab appt

## 2016-06-03 NOTE — Assessment & Plan Note (Signed)
Patient with history of seasonal allergic rhinitis. Did not want to take any medications due to concern that she might be pregnant. Exam and history today consistent with allergies.  -Rx for Claritin and Flonase given, discussed that these mediations are safe in pregnancy if second pregnancy test is +

## 2016-06-03 NOTE — Patient Instructions (Addendum)
I have put in a referral for neurology. Call [facility] about your medications and being accepted back into the practice. If they can't see you again, please ask them where else you should seek care.   Please make a lab appointment for 8/15 for another pregnancy test.   I have sent Claritin, a pill you take the everyday, and Flonase (a nasal spray) to help with your allergies.

## 2016-06-03 NOTE — Progress Notes (Signed)
   Subjective:    Allison Moore - 35 y.o. female MRN [id number]  Date of birth: Jan 10, 1981  HPI  Allison Moore is here for SDA for nasal congestion and amenorrhea.  ALLERGIES Has had symptom for 2 weeks.  Seasonal symptoms: Typically bothers her this time of year.  Nasal discharge: Yes Medications tried: None   Symptoms Sneezing: yes Scratchy throat: no Fever: no Headache or face pain: no Tooth pain: no Sore throat or swollen glands: no Severe fatigue: no Stiff neck: no Shortness of breath: no Rash: no  Amenorrhea: G2P1011. LMP June 30. Has recently had unprotected sex. Last encounter of unprotected sexual intercourse. was two weeks ago.  Not on any contraception and not interested in discussing today.    -  reports that she has been smoking Cigarettes.  She has a 2.75 pack-year smoking history. She quit smokeless tobacco use about 3 years ago. - Review of Systems: Per HPI. - Past Medical History: Patient Active Problem List   Diagnosis Date Noted  . Allergic rhinitis 06/03/2016  . Possible pregnancy 12/16/2015  . Stiffness of joint, lower leg 03/03/2015  . Muscle rigidity 03/03/2015  . MS (multiple sclerosis) (HCC) 03/03/2015  . Anxiety disorder in conditions classified elsewhere 03/05/2014  . Psychotic disorder 03/05/2014  . Pap smear for cervical cancer screening 10/15/2013  . Abnormality of gait 09/19/2013  . Headache(784.0) 09/19/2013  . Contraception management 05/11/2013  . Cannabis abuse 04/27/2013  . Amenorrhea 04/28/2010  . SCHIZOAFFECTIVE DISORDER 04/07/2010  . TOBACCO USER 10/27/2009  . Bipolar disorder (HCC) 11/16/2007  . HYPERLIPIDEMIA 12/22/2006  . OBESITY, NOS 12/22/2006  . Multiple sclerosis (HCC) 12/22/2006   - Medications: reviewed and updated    Objective:   Physical Exam BP (!) 114/93 (BP Location: Right Wrist, Patient Position: Sitting, Cuff Size: Normal)   Pulse 92   Temp 98.8 F (37.1 C) (Oral)   Ht 5\' 5"  (1.651 m)   Wt  239 lb 12.8 oz (108.8 kg)   LMP 04/23/2016 (Exact Date)   BMI 39.90 kg/m  Gen: NAD, alert, cooperative with exam, well-appearing HEENT: NCAT, PERRL, clear conjunctiva, oropharynx clear, nasal turbinates swollen bilaterally, allergic salute several times while in the room  CV: RRR, good S1/S2, no murmur Resp: CTABL, no wheezes, non-labored     Assessment & Plan:   Amenorrhea Patient over a week late for menstrual period. Upreg negative today. Given unprotected sexual intercourse within the last two weeks, will need second Upreg. Declines contraception.  -Upreg on 8/15, patient to make lab appt    Allergic rhinitis Patient with history of seasonal allergic rhinitis. Did not want to take any medications due to concern that she might be pregnant. Exam and history today consistent with allergies.  -Rx for Claritin and Flonase given, discussed that these mediations are safe in pregnancy if second pregnancy test is +     Marcy Siren, D.O. 06/03/2016, 12:22 PM PGY-2, [facility]

## 2016-06-08 ENCOUNTER — Other Ambulatory Visit: Payer: Self-pay

## 2016-06-11 ENCOUNTER — Telehealth: Payer: Self-pay | Admitting: Family Medicine

## 2016-06-11 NOTE — Telephone Encounter (Signed)
Pt needs dr approval to be able to move from her house. She says there are steps to the second floor and it is too difficult to go up the stairs.  She has MS.  She would like to come by and pick it up.  It needs to be addressed to [employer]

## 2016-06-14 NOTE — Telephone Encounter (Signed)
Unsure exactly what patient is needing/requesting. Called patient to discuss. No answer. Left voicemail for patient to please schedule an appointment to discuss her needs and be seen as I have not met her yet.   Smitty Cords, MD [place], PGY-2

## 2016-06-25 ENCOUNTER — Ambulatory Visit: Payer: Self-pay | Admitting: Family Medicine

## 2016-06-25 NOTE — Progress Notes (Deleted)
   Subjective:    Patient ID: Allison Moore , female   DOB: 1981-04-07 , 35 y.o..   MRN: [id number]  HPI  Allison Moore is here for ***  Multiple Sclerosis.  Reports she is out of Copaxone due to not being refilled by neurologist due to outstanding debt.  She reports she will be able to pay this balance tomorrow.  - Advised her to have neurologist refill Copaxone after she has reestablish care with them; she will call let us know.  She is unable to do this and we may be able to refer her to a new neurologist  Last seen by [facility] on 03/03/15. Was supposed to follow up in 5 months  Review of Systems: Per HPI. All other systems reviewed and are negative.  Health Maintenance Due  Topic Date Due  . INFLUENZA VACCINE  05/25/2016    Past Medical History: Patient Active Problem List   Diagnosis Date Noted  . Allergic rhinitis 06/03/2016  . Possible pregnancy 12/16/2015  . Stiffness of joint, lower leg 03/03/2015  . Muscle rigidity 03/03/2015  . MS (multiple sclerosis) (HCC) 03/03/2015  . Anxiety disorder in conditions classified elsewhere 03/05/2014  . Psychotic disorder 03/05/2014  . Pap smear for cervical cancer screening 10/15/2013  . Abnormality of gait 09/19/2013  . Headache(784.0) 09/19/2013  . Contraception management 05/11/2013  . Cannabis abuse 04/27/2013  . Amenorrhea 04/28/2010  . SCHIZOAFFECTIVE DISORDER 04/07/2010  . TOBACCO USER 10/27/2009  . Bipolar disorder (HCC) 11/16/2007  . HYPERLIPIDEMIA 12/22/2006  . OBESITY, NOS 12/22/2006  . Multiple sclerosis (HCC) 12/22/2006    Medications: reviewed and updated Current Outpatient Prescriptions  Medication Sig Dispense Refill  . clindamycin (CLEOCIN) 150 MG capsule Take 1 capsule (150 mg total) by mouth every 6 (six) hours. 28 capsule 0  . COPAXONE 40 MG/ML SOSY INJECT 40MG  SUBCUTANEOUSLY 3 TIMES PER WEEK 36 Syringe 1  . fluticasone (FLONASE) 50 MCG/ACT nasal spray Place 2 sprays into  both nostrils daily. 16 g 6  . ibuprofen (ADVIL,MOTRIN) 600 MG tablet Take 1 tablet (600 mg total) by mouth 3 (three) times daily. 30 tablet 0  . loratadine (CLARITIN) 10 MG tablet Take 1 tablet (10 mg total) by mouth daily. 30 tablet 11  . metroNIDAZOLE (FLAGYL) 500 MG tablet Take 1 tablet (500 mg total) by mouth 2 (two) times daily. One po bid x 7 days 14 tablet 0  . Prenatal Vit-Fe Fumarate-FA (PRENATAL VITAMINS PLUS) 27-1 MG TABS Take 1 tablet by mouth daily. 30 tablet 11   No current facility-administered medications for this visit.     Social Hx:  reports that she has been smoking Cigarettes.  She has a 2.75 pack-year smoking history. She quit smokeless tobacco use about 3 years ago.    Objective:   LMP 04/23/2016 (Exact Date)  Physical Exam  Gen: NAD, alert, cooperative with exam, well-appearing HEENT: NCAT, PERRL, clear conjunctiva, oropharynx clear, supple neck Cardiac: Regular rate and rhythm, normal S1/S2, no murmur, no edema, capillary refill brisk  Respiratory: Clear to auscultation bilaterally, no wheezes, non-labored Gastrointestinal: soft, non tender, non distended, bowel sounds present Skin: no rashes, normal turgor  Neurological: no gross deficits.  Psych: good insight, normal mood and affect   Assessment & Plan:  No problem-specific Assessment & Plan notes found for this encounter.   Follow up: ***

## 2016-07-21 ENCOUNTER — Telehealth: Payer: Self-pay | Admitting: Neurology

## 2016-07-21 NOTE — Telephone Encounter (Signed)
[facility] [phone] called regarding Lidocaine Cream Rx that patient has requested. Sharalyn Ink to have patient call our office. (Patient dismissed from [facility])

## 2016-07-21 NOTE — Telephone Encounter (Signed)
Pt has been dismissed from [facility]. We can no longer provide any medication refills.

## 2016-07-30 ENCOUNTER — Emergency Department (HOSPITAL_COMMUNITY)
Admission: EM | Admit: 2016-07-30 | Discharge: 2016-07-30 | Disposition: A | Discharge status: Home / Self Care | Payer: Commercial Managed Care - HMO | Attending: Emergency Medicine | Admitting: Emergency Medicine

## 2016-07-30 ENCOUNTER — Encounter (HOSPITAL_COMMUNITY): Payer: Self-pay | Admitting: Emergency Medicine

## 2016-07-30 DIAGNOSIS — F17210 Nicotine dependence, cigarettes, uncomplicated: Secondary | ICD-10-CM | POA: Diagnosis not present

## 2016-07-30 DIAGNOSIS — N499 Inflammatory disorder of unspecified male genital organ: Secondary | ICD-10-CM | POA: Diagnosis not present

## 2016-07-30 DIAGNOSIS — L0231 Cutaneous abscess of buttock: Secondary | ICD-10-CM | POA: Insufficient documentation

## 2016-07-30 DIAGNOSIS — G35 Multiple sclerosis: Secondary | ICD-10-CM | POA: Diagnosis not present

## 2016-07-30 LAB — I-STAT BETA HCG BLOOD, ED (MC, WL, AP ONLY): I-stat hCG, quantitative: <5 m[IU]/mL (ref <5)

## 2016-07-30 MED ORDER — SULFAMETHOXAZOLE-TRIMETHOPRIM 800-160 MG PO TABS: 2.0000 | ORAL_TABLET | Freq: Two times a day (BID) | ORAL | 0 refills | Status: DC

## 2016-07-30 MED ORDER — LIDOCAINE-EPINEPHRINE 1 %-1:100000 IJ SOLN
10.0000 mL | Freq: Once | INTRAMUSCULAR | Status: DC
  Filled 2016-07-30: qty 1

## 2016-07-30 MED ORDER — LIDOCAINE-EPINEPHRINE (PF) 2 %-1:200000 IJ SOLN
10.0000 mL | Freq: Once | INTRAMUSCULAR | Status: DC
  Filled 2016-07-30: qty 10

## 2016-07-30 NOTE — ED Notes (Signed)
Pt witnessed eating a meal from [employer].

## 2016-07-30 NOTE — ED Notes (Signed)
Spoke with pharmacy who will send medication via tube station.
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
## 2016-07-30 NOTE — ED Triage Notes (Signed)
Per [facility]:  Pt presents to ED for assessment of "boils" to "groin and buttocks".  Pt sts they have been presents 3-4 days.  Pt also requesting a pregnancy test due to  Not having menstruation x 1 month.  NAD, pt ambulatory with [facility].

## 2016-08-13 ENCOUNTER — Ambulatory Visit: Payer: Self-pay | Admitting: Internal Medicine

## 2016-08-16 ENCOUNTER — Ambulatory Visit: Payer: Self-pay | Admitting: Internal Medicine

## 2016-08-18 ENCOUNTER — Ambulatory Visit (INDEPENDENT_AMBULATORY_CARE_PROVIDER_SITE_OTHER): Payer: Commercial Managed Care - HMO | Admitting: Family Medicine

## 2016-08-18 ENCOUNTER — Encounter: Payer: Self-pay | Admitting: Family Medicine

## 2016-08-18 ENCOUNTER — Other Ambulatory Visit: Payer: Self-pay | Admitting: Family Medicine

## 2016-08-18 ENCOUNTER — Encounter (INDEPENDENT_AMBULATORY_CARE_PROVIDER_SITE_OTHER): Payer: Commercial Managed Care - HMO | Admitting: Licensed Clinical Social Worker

## 2016-08-18 VITALS — BP 114/61 | HR 86 | Temp 98.9°F | Ht 67.0 in | Wt 236.8 lb

## 2016-08-18 DIAGNOSIS — F32A Depression, unspecified: Secondary | ICD-10-CM

## 2016-08-18 DIAGNOSIS — L0232 Furuncle of buttock: Secondary | ICD-10-CM | POA: Diagnosis not present

## 2016-08-18 DIAGNOSIS — G35 Multiple sclerosis: Secondary | ICD-10-CM

## 2016-08-18 DIAGNOSIS — F329 Major depressive disorder, single episode, unspecified: Secondary | ICD-10-CM

## 2016-08-18 NOTE — Progress Notes (Signed)
Subjective: CC: boils HPI: Patient is a 35 y.o. female with a past medical history of MS, bipolar, schizoaffective disorder presenting to clinic today for a same day appt for boils, a head a cold, cramping in her hands and feet, and depression.  She arrived 16 minutes late for her appointment.    She indicated to the nurse as this was a same day appt and she could only talk about one, we'd discuss her boils (she was already prepared about this when she scheduled the appt).   I confirmed her desire to discuss this and attempted to agenda set on my entrance and she confirmed her desire to discuss her "boil on her butt."   She noted the boil on her buttocks for the last 3 days. She notes this one is small compared to previous. She feels that "every time she pees on herself she gets boils." No pain. It has been stable in size. No warmth to the area. No fevers or chills. She is unsure if it has drained. She notes she put a warm compress on the area once.   On EMR review, the patient was seen for an abscess on her left buttock on 07/30/2016. I and D was performed at that time and she was prescribed Bactrim. She notes that she finished this antibiotic.   Part way through the encounter she asks if she can "ask a question." She brings up that over the last few weeks her hands and feet have been cramping. I note that we had already agreed to discuss her boils but state it could be from low potassium or secondary to her MS. Note that she could attempt to eat some foods high potassium but I recommend her touching base with her neurologist. She stated she did not have a neurologist so I stated she should touch base with her PCP as this was simply a same day appt and I told her who her PCP was.   On exiting the exam room to get a nurse chaperone the physical exam, the patient states she would like to discuss her depression. I noted that we had already discussed that her main priority today was boils. She  agrees to this, but then states she changed her mind and would like to transition to her depression. I note that as this is a same day appointment, I do not have time to cover this and its totality. I stated that I can contact integrated care to discuss things with her after our exam. The patient was agreeable.  Social History: Current smoker   Past Medical History Patient Active Problem List   Diagnosis Date Noted  . Allergic rhinitis 06/03/2016  . Possible pregnancy 12/16/2015  . Stiffness of joint, lower leg 03/03/2015  . Muscle rigidity 03/03/2015  . MS (multiple sclerosis) ([place]) 03/03/2015  . Anxiety disorder in conditions classified elsewhere 03/05/2014  . Psychotic disorder 03/05/2014  . Pap smear for cervical cancer screening 10/15/2013  . Abnormality of gait 09/19/2013  . Headache(784.0) 09/19/2013  . Contraception management 05/11/2013  . Cannabis abuse 04/27/2013  . Amenorrhea 04/28/2010  . SCHIZOAFFECTIVE DISORDER 04/07/2010  . TOBACCO USER 10/27/2009  . Bipolar disorder ([place]) 11/16/2007  . HYPERLIPIDEMIA 12/22/2006  . OBESITY, NOS 12/22/2006  . Multiple sclerosis ([place]) 12/22/2006    Medications- reviewed and updated  Objective: Office vital signs reviewed. BP 114/61 (BP Location: Left Arm, Patient Position: Sitting, Cuff Size: Large)   Pulse 86   Temp 98.9 F (37.2 C) (  Oral)   Ht 5' 7" (1.702 m)   Wt 236 lb 12.8 oz (107.4 kg)   LMP 07/14/2016 (Approximate)   BMI 37.09 kg/m     Physical Exam  Genitourinary:      General: Awake, alert, well- nourished, NAD. Tangential in some thoughts.   PHQ screening test- 2  Assessment/Plan: Abscess status post incision and drainage on 10/6: Patient is status post I&D of this area. There is some induration, however I am not able to palpate any fluctuance that would be amenable to incision and drainage.. I do not see any evidence of overlying infection requiring antibiotics at this time. Discussed the importance of  keeping the area clean and dry. Noted that using a warm compresses may decrease the induration. Discussed that at some point, there may be fluctuance to that area that would be amenable to incision and drainage. Discussed reasons to return such as fevers, chills, pain, drainage from the site, warmth or erythema.  Depression: the patient has schizoaffective disorder, psychotic disorder, and bipolar disorder on her problem list. We have not managed her mood in several years. It appears that she is not going to monitor and several years.Her screening test was 2, therefore a PHQ9 was not initially done. After the examination and our discussion, I advised the patient that she could get dressed and I would touch base with Deborah Moore, who is working with integrated care, so that she can speak with her about her mood. Ms. Moore suggested providing a PHQ9 and I was to show the patient to be integrated care office.  As I was typing of her AVS, I noted the patient leaving the room. I asked her if she would like to stay and speak with integrated care. She began to scream at me as she continued to walk away. Reportedly, the patient was very frustrated by the care she received and began crying at the front desk. At that time, the patient met with Dr. Eniola and later agreed to meet with Ms. Deborah Moore with integrated care. They have each provided separate notes. I had a written information concerning the mobile crisis line, but the patient never received her paperwork for me as she walked out.    S.  PGY-3, [facility]  

## 2016-08-18 NOTE — Progress Notes (Signed)
Patient came for same day appointment with Dr. Leonides Schanz today, and they established agenda setting. Here main concern was vulva boil/lesion which Dr. Leonides Schanz took care of. After the visit, she mentioned she would like to discuss her Depression and MS. Appropriate recommendation was offered by Dr. Leonides Schanz. However, she was not satisfied.   I went in to talk to her.  Issues addressed.  GU boil: Not assessed by me but based on Dr. Derek Mound discussion appropriate recommendation was made. I also recommended Ibuprofen OTC prn as needed for pain and schedule follow up with her PCP for reassessment in the next few days.  Depression: PHQ9 of 17. She mentioned she had multiple mental health issues and had been on meds in the past. The last med she was on was Prozac more than one yr ago. She was taken off med by her psychiatrist because she was doing so well. I asked her what changed in the last few weeks and she stated her vulva boil and MS flare is bothering her making her feel depressed. She denies suicidal or homicidal ideation in the past week or currently. I consulted [facility] Sammuel Hines ) who counseled her briefly and gave her outpatient referral resources. Initially, I was going to restart her Prozac but noticed she has PMX of Bipolar and schizoaffective disorder. Hence I held off on it. Abilify was an option but given her hx of severe mental health issue, I will rather this be done by a qualified specialist.  This discussion was made with her. I advised she schedule appointment with psych ASAP. Advised ED visit if symptoms worsen or she becomes suicidal. She agreed with plan and verbalized understanding.  Note: per Gavin Pound, she has an appointment made with [facility] tomorrow at 8:30 am, she agreed to attend this visit. Gavin Pound also mentioned she would give her a follow-up call tomorrow.   Multiple sclerosis: She stated she was diagnosed years ago and was placed on Copaxone which she stopped years ago  because she was discharged from her neurologist's office. Since then she had been unable to get medication refilled. I advised her it will be better for this medication to be initiated by a Neurologist. I agreed to make referral for her today. May use NSAID as needed for finger pain. Return precaution discussed.

## 2016-08-18 NOTE — Patient Instructions (Signed)
It was nice to meet you.  You should follow-up with your PCP on your other concerns such his your hand and feet cramping and your depression. There is no evidence of infection on exam today. You do have a boil, but do not get needs draining or antibiotics at this time. Continue to use a warm compress on the area at least 3 times per day. If you note drainage, warmth, fevers, chills please  If you have thoughts of harming yourself or others, you have options: contact family/friends, our clinic, go to the emergency room, call our clinic, or call the MOBILE CRISIS LINE (Toll Free): [phone] that is available day or night.

## 2016-08-18 NOTE — Progress Notes (Signed)
Patient ID: Allison Moore, female   DOB: 07-06-1981, 35 y.o.   MRN: [id number]  Warm handoff   SUBJECTIVE: Allison Moore is a 35 y.o. female  referred by Dr. Leonides Schanz for:  depression. Discussed services offered by [facility], patient appreciative of support offered, verbal consent received.  Pt. reports the following symptoms/concerns : decreased appetite, sleep disturbance and feeling down       Patient's general appearance: Casual  Duration of problem:  For about one month Severity: moderate Previous treatment: [facility] for psych and therapy in 2014.  Patient has a history of Bipolar disorder and schizoaffective disorder. Has managed well until this past month with no medication or therapy.   OBJECTIVE: Mood: Irritable & Affect: Appropriate Risk of harm to self or others: denies Assessments administered: PHQ-9  with a score of 16 Moderately severe depression Depression screen PHQ 2/9 08/18/2016  Decreased Interest 2  Down, Depressed, Hopeless 3  PHQ - 2 Score 5  Altered sleeping 2  Tired, decreased energy 2  Change in appetite 2  Feeling bad or failure about yourself  2  Trouble concentrating 2  Moving slowly or fidgety/restless 1  Suicidal thoughts 0  PHQ-9 Score 16   LIFE CONTEXT:  Living situation: with father    Occupation: on permanent disability  Self-Care: limited activity,  mostly watch TV Life changes: reports no recent changes with family only changes in her health with the boil.   What is important to pt: She wants to work.  THE FOLLOW WAS DISCUSSED:Current stressors, current and new coping skills, treatment plan and goals.    GOALS ADDRESSED:  Managing depression.  INTERVENTIONS:  Reflective listening, Visualization, Relaxation and Meditation.    ASSESSMENT:  Pt currently experiencing depression. Symptoms exacerbated by stressors associated with "a boil on her butt", patient denies any other stressors.  Pt may benefit from and is in agreement to go to [facility]  tomorrow for  further assessment and therapeutic interventions.  LCSW also assessed for barriers to [facility] t and provided patient with snacks to take with her to the appointment in anticipation for a long wait.  The following was provided: Referral to Psychiatrist and Referral to [facility].    PLAN: . Patient will go to [facility] tomorrow morning at 8:30   LCSW will F/U with patient via phone call on Friday.   [phone] . Patient is in agreement to implement interventions provided:relaxation Techniques    Sammuel Hines, LCSW Licensed Clinical Social Worker [facility]   [phone] 4:26 PM

## 2016-08-20 ENCOUNTER — Telehealth: Payer: Self-pay | Admitting: Licensed Clinical Social Worker

## 2016-08-20 DIAGNOSIS — F251 Schizoaffective disorder, depressive type: Secondary | ICD-10-CM | POA: Diagnosis not present

## 2016-08-20 NOTE — Progress Notes (Signed)
Follow up call to patient refer [facility] appointment on 08/18/16 Patient informed LCSW she went to [facility] today.  Patient completed her assessment and was assigned a therapist for ongoing assessment and therapy, next theraypy appointment is Nov. 6th .  Patient will meet with the psychiatry on Monday 08/23/16.  Patient appreciative of the follow up call from LCSW and for the snacks she was given to take with her to the [facility] appointment.  PCP notified.   Sammuel Hines, LCSW Licensed Clinical Social Worker [facility]   [phone] 3:55 PM

## 2016-08-31 DIAGNOSIS — F251 Schizoaffective disorder, depressive type: Secondary | ICD-10-CM | POA: Diagnosis not present

## 2016-09-09 ENCOUNTER — Ambulatory Visit: Payer: Self-pay | Admitting: Family Medicine

## 2016-09-10 ENCOUNTER — Ambulatory Visit: Payer: Self-pay | Admitting: Student

## 2016-09-20 ENCOUNTER — Ambulatory Visit: Payer: Self-pay | Admitting: Family Medicine

## 2016-09-20 NOTE — Progress Notes (Unsigned)
   Subjective:    Patient ID: Allison Moore , female   DOB: 09/23/1981 , 35 y.o..   MRN: [id number]  HPI  Allison MontaneLaquisha Searing is here for ***.     Review of Systems: Per HPI. All other systems reviewed and are negative.  Health Maintenance Due  Topic Date Due  . INFLUENZA VACCINE  05/25/2016    Past Medical History: Patient Active Problem List   Diagnosis Date Noted  . Allergic rhinitis 06/03/2016  . Possible pregnancy 12/16/2015  . Stiffness of joint, lower leg 03/03/2015  . Muscle rigidity 03/03/2015  . MS (multiple sclerosis) (HCC) 03/03/2015  . Anxiety disorder in conditions classified elsewhere 03/05/2014  . Psychotic disorder 03/05/2014  . Pap smear for cervical cancer screening 10/15/2013  . Abnormality of gait 09/19/2013  . Headache(784.0) 09/19/2013  . Contraception management 05/11/2013  . Cannabis abuse 04/27/2013  . Amenorrhea 04/28/2010  . SCHIZOAFFECTIVE DISORDER 04/07/2010  . TOBACCO USER 10/27/2009  . Bipolar disorder (HCC) 11/16/2007  . HYPERLIPIDEMIA 12/22/2006  . OBESITY, NOS 12/22/2006  . Multiple sclerosis (HCC) 12/22/2006    Medications: reviewed and updated Current Outpatient Prescriptions  Medication Sig Dispense Refill  . COPAXONE 40 MG/ML SOSY INJECT 40MG  SUBCUTANEOUSLY 3 TIMES PER WEEK 36 Syringe 1  . fluticasone (FLONASE) 50 MCG/ACT nasal spray Place 2 sprays into both nostrils daily. 16 g 6  . ibuprofen (ADVIL,MOTRIN) 600 MG tablet Take 1 tablet (600 mg total) by mouth 3 (three) times daily. 30 tablet 0  . loratadine (CLARITIN) 10 MG tablet Take 1 tablet (10 mg total) by mouth daily. 30 tablet 11  . Prenatal Vit-Fe Fumarate-FA (PRENATAL VITAMINS PLUS) 27-1 MG TABS Take 1 tablet by mouth daily. 30 tablet 11   No current facility-administered medications for this visit.     Social Hx:  reports that she has been smoking Cigarettes.  She has a 11.00 pack-year smoking history. She quit smokeless tobacco use about 3 years ago.     Objective:   There were no vitals taken for this visit. Physical Exam  Gen: NAD, alert, cooperative with exam, well-appearing HEENT: NCAT, PERRL, clear conjunctiva, oropharynx clear, supple neck Cardiac: Regular rate and rhythm, normal S1/S2, no murmur, no edema, capillary refill brisk  Respiratory: Clear to auscultation bilaterally, no wheezes, non-labored breathing Gastrointestinal: soft, non tender, non distended, bowel sounds present Skin: no rashes, normal turgor  Neurological: no gross deficits.  Psych: good insight, normal mood and affect   Assessment & Plan:  No problem-specific Assessment & Plan notes found for this encounter.   Anders Simmondshristina Dareth Andrew, MD [facility], PGY-2

## 2016-09-27 ENCOUNTER — Encounter: Payer: Self-pay | Admitting: Family Medicine

## 2016-09-27 ENCOUNTER — Ambulatory Visit (INDEPENDENT_AMBULATORY_CARE_PROVIDER_SITE_OTHER): Payer: Commercial Managed Care - HMO | Admitting: Family Medicine

## 2016-09-27 VITALS — BP 140/74 | HR 95 | Temp 98.6°F | Wt 244.2 lb

## 2016-09-27 DIAGNOSIS — Z3200 Encounter for pregnancy test, result unknown: Secondary | ICD-10-CM

## 2016-09-27 LAB — POCT URINE PREGNANCY: Preg Test, Ur: NEGATIVE

## 2016-09-27 NOTE — Patient Instructions (Signed)
Thank you for coming in today, it was so nice to see you! Today we talked about:    Your pregnancy test is negative. You are not pregnant. If you bleed for more than 1 week, please follow up in the clinic. I highly recommend you use condoms to prevent getting any sexually transmitted diseases.  If you have any questions or concerns, please do not hesitate to call the office at [phone]. You can also message me directly via MyChart.   Sincerely,  Anders Simmonds, MD

## 2016-09-27 NOTE — Progress Notes (Signed)
   Subjective:    Patient ID: Allison Moore , female   DOB: Sep 23, 1981 , 35 y.o..   MRN: [id number]  HPI  Allison Moore is here for  Chief Complaint  Patient presents with  . positive pregnancy test   Patient comes in today wanting a pregnancy test. She took a pregnancy test last week and it was positive.She notes that 2-3 weeks ago she had bleeding for one day and then stop.  She has been having unprotected sex with her longtime female partner. She notes that last time she was sexually active was about 1 week ago. Patient notes that she is currently on her menstrual period which started on December 2. She is having some abdominal cramping mostly in the middle abdomen. She also admits to nausea and vomiting for 1 week. Her periods are regular and occur every 30 days. Denies any illicit drug use. Admits to smoking a half pack of cigarettes per day.   Review of Systems: Per HPI. All other systems reviewed and are negative. Social Hx:  reports that she has been smoking Cigarettes.  She has a 11.00 pack-year smoking history. She quit smokeless tobacco use about 3 years ago.   Objective:   BP 140/74   Pulse 95   Temp 98.6 F (37 C) (Oral)   Wt 244 lb 3.2 oz (110.8 kg)   LMP 09/25/2016   BMI 38.25 kg/m  Physical Exam  Gen: NAD, alert, cooperative with exam, well-appearing HEENT: NCAT, PERRL, clear conjunctiva Cardiac: Regular rate and rhythm Respiratory: non-labored breathing Gastrointestinal: soft, non tender, non distended, bowel sounds present Skin: no rashes, normal turgor  Neurological: no gross deficits.  Psych:  Blunt affect, normal mood   Assessment & Plan:  Possible pregnancy Patient is currently on her menstrual period. Her urine pregnancy test here is negative. Patient continues to decline any birth control as she wants to get pregnant. Recommended condom use to prevent STD, patient declines.   Anders Simmonds, MD [facility], PGY-2

## 2016-09-28 NOTE — Assessment & Plan Note (Signed)
Patient is currently on her menstrual period. Her urine pregnancy test here is negative. Patient continues to decline any birth control as she wants to get pregnant. Recommended condom use to prevent STD, patient declines.

## 2016-10-12 ENCOUNTER — Ambulatory Visit: Payer: Self-pay | Admitting: Neurology

## 2016-10-15 ENCOUNTER — Encounter: Payer: Self-pay | Admitting: Neurology

## 2016-12-02 ENCOUNTER — Emergency Department (HOSPITAL_COMMUNITY): Payer: Commercial Managed Care - HMO

## 2016-12-02 ENCOUNTER — Encounter (HOSPITAL_COMMUNITY): Payer: Self-pay | Admitting: Emergency Medicine

## 2016-12-02 ENCOUNTER — Emergency Department (HOSPITAL_COMMUNITY)
Admission: EM | Admit: 2016-12-02 | Discharge: 2016-12-02 | Disposition: A | Discharge status: Home / Self Care | Payer: Commercial Managed Care - HMO | Attending: Emergency Medicine | Admitting: Emergency Medicine

## 2016-12-02 DIAGNOSIS — F17210 Nicotine dependence, cigarettes, uncomplicated: Secondary | ICD-10-CM | POA: Insufficient documentation

## 2016-12-02 DIAGNOSIS — Z760 Encounter for issue of repeat prescription: Secondary | ICD-10-CM

## 2016-12-02 DIAGNOSIS — R0602 Shortness of breath: Secondary | ICD-10-CM | POA: Insufficient documentation

## 2016-12-02 DIAGNOSIS — Z79899 Other long term (current) drug therapy: Secondary | ICD-10-CM | POA: Insufficient documentation

## 2016-12-02 MED ORDER — FLUOXETINE HCL 20 MG PO TABS: 20.0000 mg | ORAL_TABLET | Freq: Every day | ORAL | 0 refills | Status: DC

## 2016-12-02 MED ORDER — ALBUTEROL SULFATE HFA 108 (90 BASE) MCG/ACT IN AERS
2.0000 | INHALATION_SPRAY | Freq: Once | RESPIRATORY_TRACT | Status: AC
  Administered 2016-12-02: 2 via RESPIRATORY_TRACT
  Filled 2016-12-02: qty 6.7

## 2016-12-02 MED ORDER — RISPERIDONE 1 MG PO TABS: 1.0000 mg | ORAL_TABLET | Freq: Every day | ORAL | 0 refills | Status: DC

## 2016-12-02 NOTE — Discharge Planning (Signed)
Allison Moore J. Lucretia Roers, RN, BSN, [place] [phone] [facility] set up appointment with Julianne Handler on 3/20@1030 .  Spoke with pt at bedside and advised to please arrive 15 min early and take a picture ID and your current medications.  Pt verbalizes understanding of keeping appointment.

## 2016-12-02 NOTE — ED Notes (Signed)
Spoke with Mardella Layman our pharmacy Tech.  She will verify pt.s medications from [facility]
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
## 2016-12-02 NOTE — ED Triage Notes (Signed)
Pt here for medication for MS.; pt sts generalized body aches

## 2016-12-02 NOTE — ED Notes (Signed)
Pt. oob in the hallway walking.  Pt. Stable, denies any pain or discomfort
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
## 2016-12-09 ENCOUNTER — Encounter (HOSPITAL_COMMUNITY): Payer: Self-pay | Admitting: Emergency Medicine

## 2016-12-09 ENCOUNTER — Emergency Department (HOSPITAL_COMMUNITY)
Admission: EM | Admit: 2016-12-09 | Discharge: 2016-12-10 | Disposition: A | Discharge status: Home / Self Care | Payer: Commercial Managed Care - HMO | Attending: Emergency Medicine | Admitting: Emergency Medicine

## 2016-12-09 DIAGNOSIS — Z79899 Other long term (current) drug therapy: Secondary | ICD-10-CM | POA: Insufficient documentation

## 2016-12-09 DIAGNOSIS — F17210 Nicotine dependence, cigarettes, uncomplicated: Secondary | ICD-10-CM | POA: Insufficient documentation

## 2016-12-09 DIAGNOSIS — Z046 Encounter for general psychiatric examination, requested by authority: Secondary | ICD-10-CM | POA: Insufficient documentation

## 2016-12-09 DIAGNOSIS — Z008 Encounter for other general examination: Secondary | ICD-10-CM

## 2016-12-09 DIAGNOSIS — R39198 Other difficulties with micturition: Secondary | ICD-10-CM | POA: Insufficient documentation

## 2016-12-09 LAB — URINALYSIS, ROUTINE W REFLEX MICROSCOPIC
BACTERIA UA: NONE SEEN
BILIRUBIN URINE: NEGATIVE
Glucose, UA: NEGATIVE mg/dL
KETONES UR: NEGATIVE mg/dL
LEUKOCYTES UA: NEGATIVE
NITRITE: NEGATIVE
PH: 6 (ref 5.0–8.0)
Protein, ur: NEGATIVE mg/dL
SPECIFIC GRAVITY, URINE: 1.015 (ref 1.005–1.030)

## 2016-12-09 LAB — RAPID URINE DRUG SCREEN, HOSP PERFORMED
Amphetamines: NOT DETECTED
BARBITURATES: NOT DETECTED
Benzodiazepines: NOT DETECTED
Cocaine: NOT DETECTED
Opiates: NOT DETECTED
TETRAHYDROCANNABINOL: POSITIVE — AB

## 2016-12-09 NOTE — ED Notes (Signed)
Pt provided hat for urine collection.  Pt now stating "it was just gas".

## 2016-12-09 NOTE — ED Notes (Signed)
Dr. Clarene Duke informed of pt's inability to provide urine specimen.  See orders.

## 2016-12-09 NOTE — ED Notes (Signed)
Bladder Scan: 66mL

## 2016-12-09 NOTE — ED Notes (Signed)
Dr. Clarene Duke informed of bladder scan results.

## 2016-12-09 NOTE — ED Triage Notes (Addendum)
Pt BIB [facility] from [facility] for medical clearance due to concern for a UTI; pt denies dysuria, hematuria, and foul-smelling urine; per [facility], [facility] states pt has not urinated all day; pt's only complaint is that she is under a lot of stress and needs to get back on her Multiple Sclerosis medications; denies SI; IVC paperwork present

## 2016-12-09 NOTE — ED Notes (Addendum)
Bladder scan volume = 229 mL.

## 2016-12-09 NOTE — ED Notes (Signed)
Bed: WA26 Expected date:  Expected time:  Means of arrival:  Comments: [facility]-voluntary 

## 2016-12-09 NOTE — ED Notes (Signed)
Pt states she cannot give urine sample at this time.
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
## 2016-12-10 ENCOUNTER — Encounter (HOSPITAL_COMMUNITY): Payer: Self-pay

## 2016-12-10 ENCOUNTER — Emergency Department (HOSPITAL_COMMUNITY)
Admission: EM | Admit: 2016-12-10 | Discharge: 2016-12-10 | Disposition: A | Discharge status: Home / Self Care | Payer: Commercial Managed Care - HMO | Attending: Emergency Medicine | Admitting: Emergency Medicine

## 2016-12-10 DIAGNOSIS — F17210 Nicotine dependence, cigarettes, uncomplicated: Secondary | ICD-10-CM | POA: Insufficient documentation

## 2016-12-10 DIAGNOSIS — Z139 Encounter for screening, unspecified: Secondary | ICD-10-CM

## 2016-12-10 DIAGNOSIS — Z0000 Encounter for general adult medical examination without abnormal findings: Secondary | ICD-10-CM | POA: Insufficient documentation

## 2016-12-10 DIAGNOSIS — Z79899 Other long term (current) drug therapy: Secondary | ICD-10-CM | POA: Insufficient documentation

## 2016-12-10 LAB — CBC WITH DIFFERENTIAL/PLATELET
BASOS ABS: 0 10*3/uL (ref 0.0–0.1)
Basophils Relative: 0 %
EOS ABS: 0 10*3/uL (ref 0.0–0.7)
Eosinophils Relative: 1 %
HCT: 37.7 % (ref 36.0–46.0)
HEMOGLOBIN: 12.6 g/dL (ref 12.0–15.0)
Lymphocytes Relative: 53 %
Lymphs Abs: 2.9 10*3/uL (ref 0.7–4.0)
MCH: 29.2 pg (ref 26.0–34.0)
MCHC: 33.4 g/dL (ref 30.0–36.0)
MCV: 87.5 fL (ref 78.0–100.0)
Monocytes Absolute: 0.4 10*3/uL (ref 0.1–1.0)
Monocytes Relative: 7 %
NEUTROS PCT: 39 %
Neutro Abs: 2.2 10*3/uL (ref 1.7–7.7)
PLATELETS: 382 10*3/uL (ref 150–400)
RBC: 4.31 MIL/uL (ref 3.87–5.11)
RDW: 13 % (ref 11.5–15.5)
WBC: 5.5 10*3/uL (ref 4.0–10.5)

## 2016-12-10 LAB — COMPREHENSIVE METABOLIC PANEL
ALBUMIN: 4.1 g/dL (ref 3.5–5.0)
ALK PHOS: 56 U/L (ref 38–126)
ALT: 25 U/L (ref 14–54)
AST: 19 U/L (ref 15–41)
Anion gap: 7 (ref 5–15)
BUN: 11 mg/dL (ref 6–20)
CALCIUM: 9.8 mg/dL (ref 8.9–10.3)
CHLORIDE: 106 mmol/L (ref 101–111)
CO2: 27 mmol/L (ref 22–32)
CREATININE: 0.76 mg/dL (ref 0.44–1.00)
GFR calc non Af Amer: >60 mL/min (ref >60)
GLUCOSE: 97 mg/dL (ref 65–99)
Potassium: 3.8 mmol/L (ref 3.5–5.1)
SODIUM: 140 mmol/L (ref 135–145)
Total Bilirubin: 0.3 mg/dL (ref 0.3–1.2)
Total Protein: 7.4 g/dL (ref 6.5–8.1)

## 2016-12-10 LAB — URINALYSIS, ROUTINE W REFLEX MICROSCOPIC
BACTERIA UA: NONE SEEN
BILIRUBIN URINE: NEGATIVE
Glucose, UA: NEGATIVE mg/dL
Ketones, ur: NEGATIVE mg/dL
LEUKOCYTES UA: NEGATIVE
Nitrite: NEGATIVE
PROTEIN: NEGATIVE mg/dL
SPECIFIC GRAVITY, URINE: 1.01 (ref 1.005–1.030)
pH: 7 (ref 5.0–8.0)

## 2016-12-10 LAB — PREGNANCY, URINE: Preg Test, Ur: NEGATIVE

## 2016-12-10 NOTE — Discharge Instructions (Signed)
RETURN TO ER IF YOU HAVE SEVERE PAIN WHEN YOU URINATE, BLOOD IN YOUR URINE, OR INABILITY TO URINATE.

## 2016-12-10 NOTE — ED Notes (Signed)
Received return call from [place], RN, with [facility], stating is in receipt of fax & to send pt on.

## 2016-12-10 NOTE — ED Notes (Signed)
Pt with urinary incontinence, pt cleaned & provided paper scrubs.  Bed linens changed.

## 2016-12-10 NOTE — ED Notes (Signed)
Attempted x 2 to fax lab results as requested to [phone] with success.  Faxed to [phone] & report indicates successful transmission.

## 2016-12-10 NOTE — ED Notes (Signed)
Report called to [facility].

## 2016-12-10 NOTE — ED Notes (Signed)
Pt with urinary incontinent episode.  New paper scrubs provided.

## 2016-12-10 NOTE — ED Notes (Signed)
Pt ambulated to bathroom 

## 2016-12-10 NOTE — ED Triage Notes (Signed)
Patient was brought in by [facility] for c/o dysuria. Patient is a patient at [facility] and is to return when medically cleared. Patient has been IVC'd. Patient states she is here to see if she is pregnant and has any STD's.

## 2016-12-10 NOTE — ED Notes (Signed)
Dialed [phone], spoke with Nettie Elm, RN.  Informed have faxed x 2 without successful transmission to [phone], x 1 with successful transmission to [phone].  Nettie Elm stated "well, I haven't received anything, would you fax it again?"  Informed yes, and will also fax the transmission reports.  Also questioned the return call back as noted on the [facility] sheet of [phone].  She stated "I don't know where that # came from".
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
## 2016-12-10 NOTE — Discharge Instructions (Signed)
Go to [facility] for further Psychiatric care.

## 2016-12-10 NOTE — ED Notes (Signed)
Attempted to call [facility] to inform of pt's d/c.  Dialed # listed on [place] paper of [phone], [phone], annd [phone] no answer @ those #'s.

## 2016-12-11 LAB — HIV ANTIBODY (ROUTINE TESTING W REFLEX): HIV Screen 4th Generation wRfx: NONREACTIVE

## 2016-12-11 LAB — RPR: RPR: NONREACTIVE

## 2017-01-11 ENCOUNTER — Ambulatory Visit: Payer: Self-pay | Admitting: Family Medicine

## 2017-03-30 ENCOUNTER — Encounter: Payer: Self-pay | Admitting: Internal Medicine

## 2017-03-30 ENCOUNTER — Ambulatory Visit (INDEPENDENT_AMBULATORY_CARE_PROVIDER_SITE_OTHER): Payer: Medicare HMO | Admitting: Internal Medicine

## 2017-03-30 ENCOUNTER — Inpatient Hospital Stay: Payer: Self-pay | Admitting: Family Medicine

## 2017-03-30 VITALS — BP 100/66 | HR 96 | Temp 98.9°F | Ht 67.0 in | Wt 237.2 lb

## 2017-03-30 DIAGNOSIS — G35 Multiple sclerosis: Secondary | ICD-10-CM

## 2017-03-30 DIAGNOSIS — F3170 Bipolar disorder, currently in remission, most recent episode unspecified: Secondary | ICD-10-CM | POA: Diagnosis not present

## 2017-03-30 MED ORDER — MELOXICAM 15 MG PO TABS: 15.0000 mg | ORAL_TABLET | Freq: Every day | ORAL | 3 refills | Status: DC

## 2017-03-30 NOTE — Assessment & Plan Note (Signed)
Has resumed subq Copaxone 40mg  Mon, Wed, Fri. Now administered by her father at home.  - Referral placed for neuro. Unsure if patient will be able to reestablish care with [facility] as she was previously dismissed by their practice. Explained this to patient who voiced understanding.

## 2017-03-30 NOTE — Assessment & Plan Note (Signed)
Denying symptoms today, including denial of SI/HI. Taking Prozac, Risperdal, and Cogentin as prescribed. Did not f/u with [facility] after hospital discharge as scheduled. Discussed the importance of being seen at [facility] and of taking medications daily as prescribed.  - Continue current med regimen - Provided phone number for [facility] and encouraged patient to call today and schedule appt ASAP - F/u with PCP Dr. Jonathon Jordan in about one month to ensure patient has followed up with [facility] and established care with neurology

## 2017-03-30 NOTE — Progress Notes (Signed)
   Subjective:   Patient: Allison Moore       Birthdate: 1981/07/11       MRN: [id number]      HPI  Allison Moore is a 36 y.o. female presenting for hospital follow up.   Patient recently admitted at [facility] from 03/01-05/31. She has history of MS, and had not taken her medication (Copaxone) in almost a year. She subsequently developed neuropsychiatric symptoms, primarily depression and paranoia, requiring hospital admission. Patient denies SI or HI and is unsure exactly why she required admission vs outpatient treatment. While admitted Copaxone was resumed, as well as Cogentin and risperidone which she also had not been taking. Patient was told to f/u with PCP primarily for neurology referral. She was previously seen by [facility], last in 2016, but was dismissed by the practice due to multiple missed appointments. She was also scheduled to see [facility] two days ago but says she was unaware of the appointment so did not go.  Today she says she is feeling better and has been taking all of her medications. Says she does not need any medication refills or prescriptions today. Denies symptoms of depression, including SI/HI. Denies feelings of paranoia.  Patient lives with her father who administers her medications and helps her with daily activities, as well as driving her to appointments.   Smoking status reviewed. Patient is current every day smoker.   Review of Systems See HPI.     Objective:  Physical Exam  Constitutional: She is oriented to person, place, and time and well-developed, well-nourished, and in no distress.  HENT:  Head: Normocephalic and atraumatic.  Cardiovascular: Normal rate, regular rhythm and normal heart sounds.   Pulmonary/Chest: Effort normal and breath sounds normal. No respiratory distress. She has no wheezes.  Neurological: She is alert and oriented to person, place, and time.  Skin: Skin is warm and dry.    Psychiatric: Affect and judgment normal.      Assessment & Plan:  Multiple sclerosis Has resumed subq Copaxone 40mg  Mon, Wed, Fri. Now administered by her father at home.  - Referral placed for neuro. Unsure if patient will be able to reestablish care with [facility] as she was previously dismissed by their practice. Explained this to patient who voiced understanding.   Bipolar disorder Denying symptoms today, including denial of SI/HI. Taking Prozac, Risperdal, and Cogentin as prescribed. Did not f/u with [facility] after hospital discharge as scheduled. Discussed the importance of being seen at [facility] and of taking medications daily as prescribed.  - Continue current med regimen - Provided phone number for [facility] and encouraged patient to call today and schedule appt ASAP - F/u with PCP Allison Moore in about one month to ensure patient has followed up with [facility] and established care with neurology   Allison Abernethy, MD, MPH PGY-2 [facility] Pager [phone]

## 2017-03-30 NOTE — Patient Instructions (Addendum)
It was nice meeting you today Hilmes!  I have prescribed Mobic (meloxicam) for your knee pain. Only take one pill day. DO NOT take ibuprofen, Aleve, Advil, or naproxen/naprosyn while you are taking Mobic. You can take Tylenol while you are taking Mobic.   I have placed a referral to a neurologist for you. They will call you with the date and time of this appointment.   Please schedule an appointment at [facility] as soon as possible. Their phone number is [phone].  We will see you back in a month to make sure you are still doing well.   If you have any questions or concerns, please feel free to call the clinic.   Be well,  Dr. Natale Milch

## 2017-05-04 ENCOUNTER — Ambulatory Visit (INDEPENDENT_AMBULATORY_CARE_PROVIDER_SITE_OTHER): Payer: Medicare HMO | Admitting: Family Medicine

## 2017-05-04 ENCOUNTER — Encounter: Payer: Self-pay | Admitting: Family Medicine

## 2017-05-04 DIAGNOSIS — G35 Multiple sclerosis: Secondary | ICD-10-CM

## 2017-05-04 DIAGNOSIS — F3170 Bipolar disorder, currently in remission, most recent episode unspecified: Secondary | ICD-10-CM | POA: Diagnosis not present

## 2017-05-04 NOTE — Progress Notes (Signed)
   Subjective:    Patient ID: Allison Moore , female   DOB: Feb 19, 1981 , 36 y.o..   MRN: [id number]  HPI  Jazman Reuter is here for   1. Multiple sclerosis: Patient has been dismissed from both [facility] and  due to excessive no-shows. Those are the two major neuro offices in [place]. Referral was faxed to [facility].Patient states that she Is yet to follow up with a neurologist. Is taking Copaxone 40mg  Mon, Wed, Fri which she gives to herself.   2. Bipolar: Patient states that she has been following up with [facility]. does not remember her last visit for her next visit. Taking Prozac 20 mg and risperidone 1 mg daily at bedtime.  Review of Systems: Per HPI.    Past Medical History: Patient Active Problem List   Diagnosis Date Noted  . Allergic rhinitis 06/03/2016  . Possible pregnancy 12/16/2015  . Stiffness of joint, lower leg 03/03/2015  . Muscle rigidity 03/03/2015  . MS (multiple sclerosis) (HCC) 03/03/2015  . Anxiety disorder in conditions classified elsewhere 03/05/2014  . Psychotic disorder 03/05/2014  . Pap smear for cervical cancer screening 10/15/2013  . Abnormality of gait 09/19/2013  . Headache(784.0) 09/19/2013  . Contraception management 05/11/2013  . Cannabis abuse 04/27/2013  . Amenorrhea 04/28/2010  . SCHIZOAFFECTIVE DISORDER 04/07/2010  . TOBACCO USER 10/27/2009  . Bipolar disorder (HCC) 11/16/2007  . HYPERLIPIDEMIA 12/22/2006  . OBESITY, NOS 12/22/2006  . Multiple sclerosis (HCC) 12/22/2006    Medications: reviewed   Social Hx:  reports that she has been smoking Cigarettes.  She has a 11.00 pack-year smoking history. She quit smokeless tobacco use about 4 years ago.   Objective:   Ht 5\' 5"  (1.651 m)   Wt 221 lb 9.6 oz (100.5 kg)   BMI 36.88 kg/m  Physical Exam  Gen: NAD, alert, cooperative with exam, well-appearing Psych:  blunt affect, quiet speech, poor insight   Assessment & Plan:  Multiple sclerosis Patient  continuing subcutaneous Copaxone 40 mg on Monday, Wednesday, and Friday. Her father administers this for her at home. She has yet to follow-up with neurology, she wasn't sure if they called or not. Was able to get in touch with patient's father Tamee Battin) who is in the process of becoming her legal guardian. He stated they haven't heard from the neurologist's office yet. Provided Mr. Lopata the phone number to [facility] office which per Epic review is where she was last refered to since she was dismissed from 2 other neurology offices in the area due to no shows. -Father will call the neurology office and try to schedule appointment  -continue Copaxone as prescribed  Bipolar disorder Patient stable on her medications. States she has been following up with [facility]. -Continue current medication regimen   Anders Simmonds, MD [facility], PGY-3

## 2017-05-12 NOTE — Assessment & Plan Note (Signed)
Patient stable on her medications. States she has been following up with [facility]. -Continue current medication regimen

## 2017-05-12 NOTE — Assessment & Plan Note (Signed)
Patient continuing subcutaneous Copaxone 40 mg on Monday, Wednesday, and Friday. Her father administers this for her at home. She has yet to follow-up with neurology, she wasn't sure if they called or not. Was able to get in touch with patient's father Opal Alvillar) who is in the process of becoming her legal guardian. He stated they haven't heard from the neurologist's office yet. Provided Mr. Freid the phone number to [facility] office which per Epic review is where she was last refered to since she was dismissed from 2 other neurology offices in the area due to no shows. -Father will call the neurology office and try to schedule appointment  -continue Copaxone as prescribed

## 2017-05-17 ENCOUNTER — Telehealth: Payer: Self-pay

## 2017-05-17 NOTE — Telephone Encounter (Signed)
Pt calling to request refill of: Prozac, flonase and Risperdal  Name of Medication(s):  Prozac, flonase and Risperdal Last date of OV:  05/04/2017 Pharmacy:  [facility] [address]  Will route refill request to Clinic RN.  Discussed with patient policy to call pharmacy for future refills.  Also, discussed refills may take up to 48 hours to approve or deny.  Sunday Spillers

## 2017-05-19 ENCOUNTER — Ambulatory Visit: Payer: Self-pay | Admitting: Neurology

## 2017-05-19 MED ORDER — FLUOXETINE HCL 20 MG PO TABS: 20.0000 mg | ORAL_TABLET | Freq: Every day | ORAL | 0 refills | Status: DC

## 2017-05-19 MED ORDER — RISPERIDONE 1 MG PO TABS: 1.0000 mg | ORAL_TABLET | Freq: Every day | ORAL | 0 refills | Status: DC

## 2017-05-19 MED ORDER — FLUTICASONE PROPIONATE 50 MCG/ACT NA SUSP: 2.0000 | Freq: Every day | NASAL | 6 refills | Status: DC

## 2017-05-19 NOTE — Telephone Encounter (Signed)
Refills sent. Thanks! 

## 2017-05-19 NOTE — Addendum Note (Signed)
Addended by: Beaulah Dinning on: 05/19/2017 08:18 AM   Modules accepted: Orders

## 2017-05-19 NOTE — Addendum Note (Signed)
Addended by: Beaulah Dinning on: 05/19/2017 08:19 AM   Modules accepted: Orders

## 2017-06-23 ENCOUNTER — Other Ambulatory Visit: Payer: Self-pay | Admitting: Family Medicine

## 2017-08-04 ENCOUNTER — Other Ambulatory Visit: Payer: Self-pay | Admitting: Family Medicine

## 2017-08-04 MED ORDER — FLUOXETINE HCL 20 MG PO TABS: 20.0000 mg | ORAL_TABLET | Freq: Every day | ORAL | 0 refills | Status: DC

## 2017-08-04 MED ORDER — RISPERIDONE 1 MG PO TABS: 1.0000 mg | ORAL_TABLET | Freq: Every day | ORAL | 0 refills | Status: DC

## 2017-11-18 ENCOUNTER — Other Ambulatory Visit: Payer: Self-pay

## 2017-11-22 MED ORDER — MELOXICAM 15 MG PO TABS: 15.0000 mg | ORAL_TABLET | Freq: Every day | ORAL | 3 refills | Status: DC

## 2017-11-23 ENCOUNTER — Other Ambulatory Visit: Payer: Self-pay | Admitting: *Deleted

## 2017-11-23 MED ORDER — MELOXICAM 15 MG PO TABS: 15.0000 mg | ORAL_TABLET | Freq: Every day | ORAL | 3 refills | Status: DC

## 2017-11-23 NOTE — Telephone Encounter (Signed)
Resent Rx to [facility]. Fax came in from [facility] where original was sent to but pt doesn't use this pharmacy. Lamonte Sakai, April D, [place]

## 2017-12-21 ENCOUNTER — Ambulatory Visit: Payer: Self-pay | Admitting: Family Medicine

## 2017-12-21 NOTE — Progress Notes (Deleted)
   Subjective:    Patient ID: Allison Moore , female   DOB: 12-01-1980 , 37 y.o..   MRN: [id number]  HPI  Janesse Start is here for No chief complaint on file.   1. EAR PAIN  Location: *** Ear pain started: *** Pain is: *** Severity: *** Medications tried: *** Recent ear trauma: *** Prior ear surgeries: *** Antibiotics in the last 30 days: *** History of diabetes: ***  Symptoms Ear discharge: *** Fever: *** Pain with chewing: *** Ringing in ears: *** Dizziness: *** Hearing loss: *** Rashes or blisters around ear: *** Weight loss: ***  Review of Symptoms - see HPI PMH - Smoking status noted.      Review of Systems: Per HPI. All other systems reviewed and are negative.  Health Maintenance Due  Topic Date Due  . PAP SMEAR  10/15/2016  . INFLUENZA VACCINE  05/25/2017    Past Medical History: Patient Active Problem List   Diagnosis Date Noted  . Allergic rhinitis 06/03/2016  . Possible pregnancy 12/16/2015  . Stiffness of joint, lower leg 03/03/2015  . Muscle rigidity 03/03/2015  . MS (multiple sclerosis) (HCC) 03/03/2015  . Anxiety disorder in conditions classified elsewhere 03/05/2014  . Psychotic disorder (HCC) 03/05/2014  . Pap smear for cervical cancer screening 10/15/2013  . Abnormality of gait 09/19/2013  . Headache(784.0) 09/19/2013  . Contraception management 05/11/2013  . Cannabis abuse 04/27/2013  . Amenorrhea 04/28/2010  . SCHIZOAFFECTIVE DISORDER 04/07/2010  . TOBACCO USER 10/27/2009  . Bipolar disorder (HCC) 11/16/2007  . HYPERLIPIDEMIA 12/22/2006  . OBESITY, NOS 12/22/2006  . Multiple sclerosis (HCC) 12/22/2006    Medications: reviewed and updated Current Outpatient Medications  Medication Sig Dispense Refill  . COPAXONE 40 MG/ML SOSY INJECT 40MG  SUBCUTANEOUSLY 3 TIMES PER WEEK (Patient not taking: Reported on 12/02/2016) 36 Syringe 1  . FLUoxetine (PROZAC) 20 MG tablet Take 1 tablet (20 mg total) by mouth daily. 90 tablet 0  .  fluticasone (FLONASE) 50 MCG/ACT nasal spray Place 2 sprays into both nostrils daily. 16 g 6  . ibuprofen (ADVIL,MOTRIN) 600 MG tablet Take 1 tablet (600 mg total) by mouth 3 (three) times daily. (Patient not taking: Reported on 12/09/2016) 30 tablet 0  . meloxicam (MOBIC) 15 MG tablet Take 1 tablet (15 mg total) by mouth daily. 30 tablet 3  . risperiDONE (RISPERDAL) 1 MG tablet Take 1 tablet (1 mg total) by mouth at bedtime. 90 tablet 0   No current facility-administered medications for this visit.     Social Hx:  reports that she has been smoking cigarettes.  She has a 11.00 pack-year smoking history. She quit smokeless tobacco use about 4 years ago.   Objective:   There were no vitals taken for this visit. Physical Exam  Gen: NAD, alert, cooperative with exam, well-appearing HEENT: NCAT, PERRL, clear conjunctiva, oropharynx clear, supple neck Cardiac: Regular rate and rhythm, normal S1/S2, no murmur, no edema, capillary refill brisk  Respiratory: Clear to auscultation bilaterally, no wheezes, non-labored breathing Gastrointestinal: soft, non tender, non distended, bowel sounds present Skin: no rashes, normal turgor  Neurological: no gross deficits.  Psych: good insight, normal mood and affect  Assessment & Plan:  No problem-specific Assessment & Plan notes found for this encounter.  No orders of the defined types were placed in this encounter.  No orders of the defined types were placed in this encounter.   Anders Simmonds, MD [facility], PGY-3

## 2018-02-08 ENCOUNTER — Encounter: Payer: Medicare HMO | Admitting: Family Medicine

## 2018-02-20 ENCOUNTER — Other Ambulatory Visit (HOSPITAL_COMMUNITY)
Admission: RE | Admit: 2018-02-20 | Discharge: 2018-02-20 | Disposition: A | Discharge status: Home / Self Care | Payer: Medicare HMO | Source: Ambulatory Visit | Attending: Family Medicine | Admitting: Family Medicine

## 2018-02-20 ENCOUNTER — Ambulatory Visit (INDEPENDENT_AMBULATORY_CARE_PROVIDER_SITE_OTHER): Payer: Medicare HMO | Admitting: Family Medicine

## 2018-02-20 ENCOUNTER — Encounter: Payer: Self-pay | Admitting: Family Medicine

## 2018-02-20 ENCOUNTER — Other Ambulatory Visit: Payer: Self-pay

## 2018-02-20 VITALS — BP 100/76 | HR 79 | Temp 98.5°F | Ht 64.0 in | Wt 250.0 lb

## 2018-02-20 DIAGNOSIS — N898 Other specified noninflammatory disorders of vagina: Secondary | ICD-10-CM | POA: Diagnosis not present

## 2018-02-20 DIAGNOSIS — Z0000 Encounter for general adult medical examination without abnormal findings: Secondary | ICD-10-CM

## 2018-02-20 DIAGNOSIS — Z124 Encounter for screening for malignant neoplasm of cervix: Secondary | ICD-10-CM

## 2018-02-20 DIAGNOSIS — Z3200 Encounter for pregnancy test, result unknown: Secondary | ICD-10-CM | POA: Diagnosis not present

## 2018-02-20 DIAGNOSIS — E669 Obesity, unspecified: Secondary | ICD-10-CM | POA: Diagnosis not present

## 2018-02-20 DIAGNOSIS — F17200 Nicotine dependence, unspecified, uncomplicated: Secondary | ICD-10-CM | POA: Diagnosis not present

## 2018-02-20 LAB — POCT WET PREP (WET MOUNT)
CLUE CELLS WET PREP WHIFF POC: NEGATIVE
Trichomonas Wet Prep HPF POC: ABSENT

## 2018-02-20 LAB — POCT URINE PREGNANCY: Preg Test, Ur: NEGATIVE

## 2018-02-20 NOTE — Patient Instructions (Signed)
Thank you for coming in today, it was so nice to see you! Today we talked about:    Today we took care of your physical  Continue to follow up with [facility] and your neurologist   We are getting blood work for you today  Please follow up in 3 months. You can schedule this appointment at the front desk before you leave or call the clinic.  Bring in all your medications or supplements to each appointment for review.   If we ordered any tests today, you will be notified via telephone of any abnormalities. If everything is normal you will get a letter in the mail.   If you have any questions or concerns, please do not hesitate to call the office at [phone]. You can also message me directly via MyChart.   Sincerely,  Anders Simmonds, MD
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
## 2018-02-21 ENCOUNTER — Telehealth: Payer: Self-pay | Admitting: Family Medicine

## 2018-02-21 ENCOUNTER — Telehealth: Payer: Self-pay

## 2018-02-21 LAB — CBC
HEMATOCRIT: 35.7 % (ref 34.0–46.6)
HEMOGLOBIN: 11.7 g/dL (ref 11.1–15.9)
MCH: 28.8 pg (ref 26.6–33.0)
MCHC: 32.8 g/dL (ref 31.5–35.7)
MCV: 88 fL (ref 79–97)
Platelets: 322 10*3/uL (ref 150–379)
RBC: 4.06 x10E6/uL (ref 3.77–5.28)
RDW: 13.6 % (ref 12.3–15.4)
WBC: 5.6 10*3/uL (ref 3.4–10.8)

## 2018-02-21 LAB — CYTOLOGY - PAP
DIAGNOSIS: NEGATIVE
HPV: NOT DETECTED

## 2018-02-21 LAB — BASIC METABOLIC PANEL
BUN/Creatinine Ratio: 17 (ref 9–23)
BUN: 16 mg/dL (ref 6–20)
CO2: 22 mmol/L (ref 20–29)
CREATININE: 0.94 mg/dL (ref 0.57–1.00)
Calcium: 9 mg/dL (ref 8.7–10.2)
Chloride: 105 mmol/L (ref 96–106)
GFR, EST AFRICAN AMERICAN: 90 mL/min/{1.73_m2} (ref >59)
GFR, EST NON AFRICAN AMERICAN: 78 mL/min/{1.73_m2} (ref >59)
Glucose: 80 mg/dL (ref 65–99)
POTASSIUM: 4.4 mmol/L (ref 3.5–5.2)
SODIUM: 140 mmol/L (ref 134–144)

## 2018-02-21 LAB — CERVICOVAGINAL ANCILLARY ONLY
Chlamydia: NEGATIVE
NEISSERIA GONORRHEA: NEGATIVE

## 2018-02-21 NOTE — Telephone Encounter (Signed)
Pt called requesting a call back about her results from Monday. Please advise

## 2018-02-21 NOTE — Telephone Encounter (Signed)
Attempted to call patient back. Tried both numbers on file. No answer. Voicemail box had a female voice message, did not leave voicemail.   Patient's lab results were all normal. Will send her a letter in the mail.

## 2018-02-21 NOTE — Telephone Encounter (Signed)
Pt left message on nurse line that she needed to come get another copy of her AVS from yesterday, that she left it on the bus. Did not leave return phone number. AVS printed and left up front in case she comes to pick up. Shawna Orleans, RN

## 2018-03-02 ENCOUNTER — Other Ambulatory Visit: Payer: Self-pay | Admitting: Family Medicine

## 2018-03-09 ENCOUNTER — Telehealth: Payer: Self-pay | Admitting: Family Medicine

## 2018-04-05 ENCOUNTER — Telehealth: Payer: Self-pay | Admitting: Family Medicine

## 2018-04-05 NOTE — Telephone Encounter (Signed)
Pt has started a new job and would like to know if Dr Jonathon Jordan could write a note stating all of her medical conditions and her medications and that some medications make her need to use the restroom frequently. She said she would pick it up at the front and for someone to call her when it is ready to be picked up.

## 2018-04-05 NOTE — Telephone Encounter (Signed)
Wrote letter and placed at front desk. Attempted to call patient but there was no answer and no option to leave voicemail  Anders Simmonds, MD [facility], PGY-3

## 2018-04-22 ENCOUNTER — Other Ambulatory Visit: Payer: Self-pay | Admitting: Family Medicine

## 2018-05-22 ENCOUNTER — Telehealth: Payer: Self-pay | Admitting: Family Medicine

## 2018-05-22 NOTE — Telephone Encounter (Signed)
[employer] application form dropped off for at front desk for completion.  Verified that patient section of form has been completed.  Last DOS/WCC with PCP was 02/20/18.  Placed form in  [facility] team folder to be completed by clinical staff.  Lina Sar

## 2018-05-23 NOTE — Telephone Encounter (Signed)
Clinical info completed on SCAT form.  Place form in Dr. Starr Sinclair box for completion.  Allison Moore, Allison Moore, [place]

## 2018-05-24 NOTE — Telephone Encounter (Signed)
Pt was calling to check on the status of her forms being signed. I told her they were placed in the doctors waiting to be completed. She would like to be contacted when they are ready to be picked up.

## 2018-05-25 NOTE — Telephone Encounter (Signed)
Patient's form is ready to pick up at the front of the office.  Thanks!

## 2018-05-25 NOTE — Telephone Encounter (Signed)
Tried to contact pt to inform her of below and message states"your call can not be completed at this time, please try again later." If pt calls back please inform her of below. Allison Moore, Allison Moore D, [place]

## 2018-06-14 NOTE — Telephone Encounter (Signed)
Finished

## 2018-06-16 ENCOUNTER — Ambulatory Visit (INDEPENDENT_AMBULATORY_CARE_PROVIDER_SITE_OTHER): Payer: Medicare HMO | Admitting: Family Medicine

## 2018-06-16 ENCOUNTER — Other Ambulatory Visit: Payer: Self-pay

## 2018-06-16 ENCOUNTER — Encounter: Payer: Self-pay | Admitting: Family Medicine

## 2018-06-16 VITALS — BP 124/76 | HR 79 | Temp 98.2°F | Ht 64.0 in | Wt 237.8 lb

## 2018-06-16 DIAGNOSIS — L739 Follicular disorder, unspecified: Secondary | ICD-10-CM | POA: Insufficient documentation

## 2018-06-16 DIAGNOSIS — L608 Other nail disorders: Secondary | ICD-10-CM | POA: Diagnosis not present

## 2018-06-16 DIAGNOSIS — L84 Corns and callosities: Secondary | ICD-10-CM

## 2018-06-16 MED ORDER — MUPIROCIN 2 % EX OINT: 1.0000 "application " | TOPICAL_OINTMENT | Freq: Two times a day (BID) | CUTANEOUS | 2 refills | Status: DC

## 2018-06-16 MED ORDER — MELOXICAM 15 MG PO TABS: 15.0000 mg | ORAL_TABLET | Freq: Every day | ORAL | 3 refills | Status: DC

## 2018-06-16 NOTE — Assessment & Plan Note (Signed)
Will refer to podiatry

## 2018-06-16 NOTE — Assessment & Plan Note (Signed)
Could also be hydradenitis suppurativa, but location solely in the groin argues against this.  Prescribed Bactroban.

## 2018-06-16 NOTE — Progress Notes (Signed)
   Subjective:    Allison Moore - 37 y.o. female MRN [id number]  Date of birth: [birth year]  HPI  [name] is here for bilateral foot pain. - started about three months ago - soles of feet are painful where her calluses are - gets pedicures, but these are minimally helpful  - works on her feet all day at [employer] - no alleviating factors - standing makes the pain worse - when she was in the hospital last year, someone shaved the calluses of her feet and trimmed her toe nail, which was helpful  Boils around groin - located in bilaterally sides of groin - have been occurring for months - uses hydrocortisone cream, which does seem to help - does not shave that area  Health Maintenance:  There are no preventive care reminders to display for this patient.  -  reports that she has been smoking cigarettes. She has a 11.00 pack-year smoking history. She quit smokeless tobacco use about 5 years ago. - Review of Systems: Per HPI. - Past Medical History: Patient Active Problem List   Diagnosis Date Noted  . Acquired dysmorphic toenail 06/16/2018  . Callus of foot 06/16/2018  . Folliculitis 06/16/2018  . Allergic rhinitis 06/03/2016  . Anxiety disorder in conditions classified elsewhere 03/05/2014  . Contraception management 05/11/2013  . Cannabis abuse 04/27/2013  . SCHIZOAFFECTIVE DISORDER 04/07/2010  . TOBACCO USER 10/27/2009  . Bipolar disorder (HCC) 11/16/2007  . HYPERLIPIDEMIA 12/22/2006  . OBESITY, NOS 12/22/2006  . Multiple sclerosis (HCC) 12/22/2006   - Medications: reviewed and updated   Objective:   Physical Exam BP 124/76   Pulse 79   Temp 98.2 F (36.8 C) (Oral)   Ht 5\' 4"  (1.626 m)   Wt 237 lb 12.8 oz (107.9 kg)   SpO2 99%   BMI 40.82 kg/m  Gen: NAD, alert, cooperative with exam, well-appearing CV: RRR, good S1/S2, no murmur Resp: CTABL, no wheezes, non-labored Extremities: thick calluses on metatarsal heads; possibly plantar wart at that  location bilaterally, but difficult to tell.  Also dysmorphic toe nails on bilateral feet. Neuro: slow to respond with possibly some mild developmental delay    Assessment & Plan:   Callus of foot Will refer to podiatry.  Acquired dysmorphic toenail Will refer to podiatry.  Folliculitis Could also be hydradenitis suppurativa, but location solely in the groin argues against this.  Prescribed Bactroban.    Lezlie Octave, M.D. 06/16/2018, 12:13 PM PGY-2, [facility]

## 2018-06-16 NOTE — Patient Instructions (Signed)
It was nice meeting you today Ms. Kingma!  For your boils, try Bactroban cream.  Apply this cream twice per day when needed.  For your feet, I have referred you to a podiatrist (foot doctor).  Please let us know if they haven't called you for an appointment in the next two weeks.  Please pick up your SCAT form at the front office.    If you have any questions or concerns, please feel free to call the clinic.   Be well,  Dr. Frances Furbish

## 2018-07-18 ENCOUNTER — Ambulatory Visit: Payer: Medicare HMO | Admitting: Podiatry

## 2018-07-18 ENCOUNTER — Encounter: Payer: Self-pay | Admitting: Podiatry

## 2018-07-18 DIAGNOSIS — M79675 Pain in left toe(s): Secondary | ICD-10-CM

## 2018-07-18 DIAGNOSIS — Q828 Other specified congenital malformations of skin: Secondary | ICD-10-CM | POA: Diagnosis not present

## 2018-07-18 DIAGNOSIS — R2681 Unsteadiness on feet: Secondary | ICD-10-CM | POA: Diagnosis not present

## 2018-07-18 DIAGNOSIS — B351 Tinea unguium: Secondary | ICD-10-CM

## 2018-07-18 DIAGNOSIS — M79674 Pain in right toe(s): Secondary | ICD-10-CM | POA: Diagnosis not present
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
## 2018-07-19 ENCOUNTER — Encounter: Payer: Self-pay | Admitting: Podiatry

## 2018-08-03 ENCOUNTER — Telehealth: Payer: Self-pay | Admitting: Podiatry

## 2018-08-03 NOTE — Telephone Encounter (Signed)
Per vm from [employer] Civil engineer, contracting from [place]) [phone] was denied not deemed medically necessary. Covered if documented diabetic foot disease. Can do peer to peer but expires 10.11.19 @ 1pm.(phone number for peer to peer is [phone]) If we want an expedited appeal the number is [phone] and ref # [id number]

## 2018-08-04 NOTE — Telephone Encounter (Signed)
Per vm from [place] again the [id number] x2 has been denied by insurance.ref # [id number]. They will mail out denial letters today.

## 2018-08-07 NOTE — Telephone Encounter (Signed)
I had told her that it likely would not be covered and this is why I did the medical letter of necessity.

## 2018-10-16 ENCOUNTER — Ambulatory Visit: Payer: Medicare HMO | Admitting: Podiatry

## 2018-10-31 ENCOUNTER — Ambulatory Visit: Payer: Medicare HMO | Admitting: Podiatry

## 2018-10-31 ENCOUNTER — Encounter: Payer: Self-pay | Admitting: Podiatry

## 2018-10-31 DIAGNOSIS — L84 Corns and callosities: Secondary | ICD-10-CM | POA: Diagnosis not present

## 2018-10-31 DIAGNOSIS — G35 Multiple sclerosis: Secondary | ICD-10-CM | POA: Diagnosis not present

## 2018-10-31 DIAGNOSIS — M79674 Pain in right toe(s): Secondary | ICD-10-CM

## 2018-10-31 DIAGNOSIS — M79675 Pain in left toe(s): Secondary | ICD-10-CM

## 2018-10-31 DIAGNOSIS — B351 Tinea unguium: Secondary | ICD-10-CM

## 2018-10-31 MED ORDER — CICLOPIROX 8 % EX SOLN: Freq: Every day | CUTANEOUS | 6 refills | Status: DC

## 2018-10-31 NOTE — Patient Instructions (Addendum)
Onychomycosis/Fungal Toenails  WHAT IS IT? An infection that lies within the keratin of your nail plate that is caused by a fungus.  WHY ME? Fungal infections affect all ages, sexes, races, and creeds.  There may be many factors that predispose you to a fungal infection such as age, coexisting medical conditions such as diabetes, or an autoimmune disease; stress, medications, fatigue, genetics, etc.  Bottom line: fungus thrives in a warm, moist environment and your shoes offer such a location.  IS IT CONTAGIOUS? Theoretically, yes.  You do not want to share shoes, nail clippers or files with someone who has fungal toenails.  Walking around barefoot in the same room or sleeping in the same bed is unlikely to transfer the organism.  It is important to realize, however, that fungus can spread easily from one nail to the next on the same foot.  HOW DO WE TREAT THIS?  There are several ways to treat this condition.  Treatment may depend on many factors such as age, medications, pregnancy, liver and kidney conditions, etc.  It is best to ask your doctor which options are available to you.  1. No treatment.   Unlike many other medical concerns, you can live with this condition.  However for many people this can be a painful condition and may lead to ingrown toenails or a bacterial infection.  It is recommended that you keep the nails cut short to help reduce the amount of fungal nail. 2. Topical treatment.  These range from herbal remedies to prescription strength nail lacquers.  About 40-50% effective, topicals require twice daily application for approximately 9 to 12 months or until an entirely new nail has grown out.  The most effective topicals are medical grade medications available through physicians offices. 3. Oral antifungal medications.  With an 80-90% cure rate, the most common oral medication requires 3 to 4 months of therapy and stays in your system for a year as the new nail grows out.  Oral  antifungal medications do require blood work to make sure it is a safe drug for you.  A liver function panel will be performed prior to starting the medication and after the first month of treatment.  It is important to have the blood work performed to avoid any harmful side effects.  In general, this medication safe but blood work is required. 4. Laser Therapy.  This treatment is performed by applying a specialized laser to the affected nail plate.  This therapy is noninvasive, fast, and non-painful.  It is not covered by insurance and is therefore, out of pocket.  The results have been very good with a 80-95% cure rate.  The [facility] is the only practice in the area to offer this therapy. 5. Permanent Nail Avulsion.  Removing the entire nail so that a new nail will not grow back.  Corns and Calluses Corns are small areas of thickened skin that occur on the top, sides, or tip of a toe. They contain a cone-shaped core with a point that can press on a nerve below. This causes pain.  Calluses are areas of thickened skin that can occur anywhere on the body, including the hands, fingers, palms, soles of the feet, and heels. Calluses are usually larger than corns. What are the causes? Corns and calluses are caused by rubbing (friction) or pressure, such as from shoes that are too tight or do not fit properly. What increases the risk? Corns are more likely to develop in people   who have misshapen toes (toe deformities), such as hammer toes. Calluses can occur with friction to any area of the skin. They are more likely to develop in people who:  Work with their hands.  Wear shoes that fit poorly, are too tight, or are high-heeled.  Have toe deformities. What are the signs or symptoms? Symptoms of a corn or callus include:  A hard growth on the skin.  Pain or tenderness under the skin.  Redness and swelling.  Increased discomfort while wearing tight-fitting shoes, if your feet are  affected. If a corn or callus becomes infected, symptoms may include:  Redness and swelling that gets worse.  Pain.  Fluid, blood, or pus draining from the corn or callus. How is this diagnosed? Corns and calluses may be diagnosed based on your symptoms, your medical history, and a physical exam. How is this treated? Treatment for corns and calluses may include:  Removing the cause of the friction or pressure. This may involve: ? Changing your shoes. ? Wearing shoe inserts (orthotics) or other protective layers in your shoes, such as a corn pad. ? Wearing gloves.  Applying medicine to the skin (topical medicine) to help soften skin in the hardened, thickened areas.  Removing layers of dead skin with a file to reduce the size of the corn or callus.  Removing the corn or callus with a scalpel or laser.  Taking antibiotic medicines, if your corn or callus is infected.  Having surgery, if a toe deformity is the cause. Follow these instructions at home:   Take over-the-counter and prescription medicines only as told by your health care provider.  If you were prescribed an antibiotic, take it as told by your health care provider. Do not stop taking it even if your condition starts to improve.  Wear shoes that fit well. Avoid wearing high-heeled shoes and shoes that are too tight or too loose.  Wear any padding, protective layers, gloves, or orthotics as told by your health care provider.  Soak your hands or feet and then use a file or pumice stone to soften your corn or callus. Do this as told by your health care provider.  Check your corn or callus every day for symptoms of infection. Contact a health care provider if you:  Notice that your symptoms do not improve with treatment.  Have redness or swelling that gets worse.  Notice that your corn or callus becomes painful.  Have fluid, blood, or pus coming from your corn or callus.  Have new symptoms. Summary  Corns are  small areas of thickened skin that occur on the top, sides, or tip of a toe.  Calluses are areas of thickened skin that can occur anywhere on the body, including the hands, fingers, palms, and soles of the feet. Calluses are usually larger than corns.  Corns and calluses are caused by rubbing (friction) or pressure, such as from shoes that are too tight or do not fit properly.  Treatment may include wearing any padding, protective layers, gloves, or orthotics as told by your health care provider. This information is not intended to replace advice given to you by your health care provider. Make sure you discuss any questions you have with your health care provider. Document Released: 07/17/2004 Document Revised: 08/24/2017 Document Reviewed: 08/24/2017 Elsevier Interactive Patient Education  2019 [employer]  

## 2018-11-01 ENCOUNTER — Telehealth: Payer: Self-pay | Admitting: Podiatry

## 2018-11-01 NOTE — Telephone Encounter (Signed)
"  My insurance company wont pay for this medication, is there another kind? I use the [facility] on [address]

## 2018-11-02 ENCOUNTER — Telehealth: Payer: Self-pay | Admitting: *Deleted

## 2018-11-02 NOTE — Telephone Encounter (Signed)
Unable to leave a message on home/mobile phone recording states it is a non-working number.

## 2018-11-02 NOTE — Telephone Encounter (Signed)
-----   Message from Freddie Breech, DPM sent at 11/01/2018  5:17 PM EST ----- Regarding: Antifungal for nails If she wants to try Formula 3, that's fine. I think cost is a factor, though.  Not much else will work for her toenails.

## 2018-11-02 NOTE — Telephone Encounter (Signed)
Unable to leave message on [phone], female voice states I was a wrong number.

## 2018-11-02 NOTE — Telephone Encounter (Signed)
Communication closed on [employer].

## 2018-11-21 NOTE — Progress Notes (Signed)
Subjective: Allison Moore is a 38 year old African-American female with multiple sclerosis who presents today for follow-up of painful mycotic toenails bilaterally 1 through 5.  She also relates painful calluses bilaterally.    Today she is requesting topical antifungal for her toenails.   Lennox Solders, MD is her PCP.    Allergies  Allergen Reactions  . Norco [Hydrocodone-Acetaminophen] Other (See Comments)    Confusion  . Cephalexin     REACTION: Rash  . Hydrocodone-Acetaminophen   . Pork-Derived Products     Pt states d/t MS    Objective:  Vascular Examination: Capillary refill time immediate x 10 digits  Dorsalis pedis and Posterior tibial pulses are 2/4 b/l  Digital hair present x 10 digits  Skin temperature gradient WNL b/l  Dermatological Examination: Skin with normal turgor, texture and tone b/l  Toenails 1-5 b/l discolored, thick, dystrophic with subungual debris and pain with palpation to nailbeds due to thickness of nails.  Hyperkeratotic lesions noted submetatarsal head 1 bilaterally, dorsal PIPJ of the left fifth digit.  There is no erythema, no edema, no drainage, no flocculence noted.  No impending wounds are noted.  Musculoskeletal: Muscle strength 5/5 to all LE muscle groups  No gross bony deformities b/l.  No pain, crepitus or joint limitation noted with ROM.   Neurological: Sensation intact with 10 gram monofilament.   Assessment: 1. Painful onychomycosis toenails 1-5 b/l  2. Calluses submetatarsal head 1 bilaterally 3. Corn left fifth digit 4.  Multiple sclerosis  Plan: 1. Toenails 1-5 b/l were debrided in length and girth without iatrogenic bleeding..  Prescription was sent to her pharmacy for ciclopirox 8% solution.  She has apply 1 coat to each toenail daily and remove once weekly with nail polish remover.  She is to perform this for approximately 48 weeks. 2. Calluses submetatarsal head 1 bilaterally, and corn left fifth digit  were pared without complication. 3. Patient to continue soft, supportive shoe gear 4. Patient to report any pedal injuries to medical professional immediately. 5. Follow up 3 months.  6. Patient/POA to call should there be a concern in the interim.

## 2018-11-28 ENCOUNTER — Other Ambulatory Visit: Payer: Self-pay

## 2018-11-28 ENCOUNTER — Encounter: Payer: Self-pay | Admitting: Family Medicine

## 2018-11-28 ENCOUNTER — Ambulatory Visit (INDEPENDENT_AMBULATORY_CARE_PROVIDER_SITE_OTHER): Payer: Medicare HMO | Admitting: Family Medicine

## 2018-11-28 VITALS — BP 120/68 | HR 92 | Temp 97.3°F | Ht 64.0 in | Wt 251.2 lb

## 2018-11-28 DIAGNOSIS — R300 Dysuria: Secondary | ICD-10-CM

## 2018-11-28 LAB — POCT URINALYSIS DIP (MANUAL ENTRY)
Bilirubin, UA: NEGATIVE
Glucose, UA: NEGATIVE mg/dL
Ketones, POC UA: NEGATIVE mg/dL
LEUKOCYTES UA: NEGATIVE
Nitrite, UA: NEGATIVE
PH UA: 7 (ref 5.0–8.0)
PROTEIN UA: NEGATIVE mg/dL
SPEC GRAV UA: 1.025 (ref 1.010–1.025)
UROBILINOGEN UA: 0.2 U/dL

## 2018-11-28 LAB — POCT UA - MICROSCOPIC ONLY: RBC, urine, microscopic: >20

## 2018-11-28 MED ORDER — IBUPROFEN 800 MG PO TABS: 800.0000 mg | ORAL_TABLET | Freq: Three times a day (TID) | ORAL | 11 refills | Status: DC

## 2018-11-28 MED ORDER — NITROFURANTOIN MONOHYD MACRO 100 MG PO CAPS: 100.0000 mg | ORAL_CAPSULE | Freq: Two times a day (BID) | ORAL | 0 refills | Status: DC

## 2018-11-28 NOTE — Patient Instructions (Signed)
It was nice seeing you today Ms. Robling!  For your urinary symptoms, I am prescribing Macrobid, which you should take twice per day for the next 5 days.  We will get some urine today and see if a bacteria grows on that culture.  We will let you know if your results are abnormal.  Try hydrocortisone cream for your finger, which can be found at your pharmacy.  I will also refill your ibuprofen, but I do not want you to continue taking meloxicam long-term for headaches, since this can cause kidney and stomach damage.  If you have any questions or concerns, please feel free to call the clinic.   Be well,  Dr. Frances Furbish

## 2018-11-28 NOTE — Progress Notes (Signed)
urin  ur

## 2018-11-28 NOTE — Assessment & Plan Note (Addendum)
The symptoms are the same symptoms patient has had with a previous UTI, will empirically treat with Macrobid 100 mg twice daily for 5 days.  Will obtain a UA and urine culture.  Patient was given return precautions, including fevers and chills or increased back pain.  I told patient that I will let her know if her urine shows abnormal results.

## 2018-11-28 NOTE — Progress Notes (Signed)
   Subjective:    Allison Moore - 38 y.o. female MRN [id number]  Date of birth: 04-Nov-1980  CC:  Allison Moore is here for urinary symptoms.  HPI: -started Friday, January 31 -feels like she has had to urinate more frequently at night -also feels like her bladder is very full and often cannot make it to the bathroom before -this is similar to a previous UTI -denies fevers and chills -mostly drinks water and soda, although she drank lemonade last night  Health Maintenance:  There are no preventive care reminders to display for this patient.  -  reports that she has been smoking cigarettes. She has a 11.00 pack-year smoking history. She quit smokeless tobacco use about 5 years ago. - Review of Systems: Per HPI. - Past Medical History: Patient Active Problem List   Diagnosis Date Noted  . Acquired dysmorphic toenail 06/16/2018  . Callus of foot 06/16/2018  . Folliculitis 06/16/2018  . Allergic rhinitis 06/03/2016  . Anxiety disorder in conditions classified elsewhere 03/05/2014  . Contraception management 05/11/2013  . Cannabis abuse 04/27/2013  . Dysuria 07/30/2010  . SCHIZOAFFECTIVE DISORDER 04/07/2010  . TOBACCO USER 10/27/2009  . Bipolar disorder (HCC) 11/16/2007  . HYPERLIPIDEMIA 12/22/2006  . OBESITY, NOS 12/22/2006  . Multiple sclerosis (HCC) 12/22/2006   - Medications: reviewed and updated   Objective:   Physical Exam BP 120/68   Pulse 92   Temp (!) 97.3 F (36.3 C) (Oral)   Ht 5\' 4"  (1.626 m)   Wt 251 lb 4 oz (114 kg)   SpO2 99%   BMI 43.13 kg/m  Gen: NAD, alert, cooperative with exam HEENT: Sounds congested and coughs occasionally CV: RRR, good S1/S2, no murmur Resp: CTABL, no wheezes, non-labored Abd: No suprapubic tenderness, although patient does report some CVA tenderness on the left.      Assessment & Plan:   Dysuria The symptoms are the same symptoms patient has had with a previous UTI, will empirically treat with Macrobid 100 mg  twice daily for 5 days.  Will obtain a UA and urine culture.  Patient was given return precautions, including fevers and chills or increased back pain.  I told patient that I will let her know if her urine shows abnormal results.    Lezlie Octave, M.D. 11/28/2018, 11:56 AM PGY-2, [facility]

## 2018-11-30 LAB — URINE CULTURE

## 2018-12-25 ENCOUNTER — Telehealth: Payer: Self-pay | Admitting: Podiatry

## 2018-12-25 NOTE — Telephone Encounter (Signed)
Pt was seen for toenail fungus back in January and was prescribed Penlac. When the patient went to pick up the medication she was told it was not covered by her insurance and would like to know if there is something else that can be called in.

## 2018-12-25 NOTE — Telephone Encounter (Signed)
Unable to leave a message mobile/home phone is not a working number.

## 2018-12-25 NOTE — Telephone Encounter (Signed)
Unable to leave a message on mobile phone the female voice answering the phone states I have contacted the wrong number.

## 2019-01-16 ENCOUNTER — Ambulatory Visit: Payer: Medicare HMO | Admitting: Podiatry

## 2019-01-30 ENCOUNTER — Ambulatory Visit: Payer: Medicare HMO | Admitting: Podiatry

## 2019-02-13 ENCOUNTER — Ambulatory Visit: Payer: Medicare HMO

## 2019-02-26 ENCOUNTER — Ambulatory Visit: Payer: Medicare HMO | Admitting: Podiatry

## 2019-03-07 ENCOUNTER — Ambulatory Visit: Payer: Medicare HMO

## 2019-03-08 ENCOUNTER — Ambulatory Visit: Payer: Medicare HMO | Admitting: Family Medicine

## 2019-03-08 NOTE — Progress Notes (Deleted)
   Subjective:    Patient ID: Allison Moore, female    DOB: 03/02/1981, 38 y.o.   MRN: [id number]   CC:  HPI:  Progestin Implant Removal Note (PATIENT NAME) is here for removal of her etonogestrel rod implant (Implanon/Nexplanon). She would like it removed because of: ***  An informed consent was taken prior to removal and is to be scanned into the Electronic Health Record.  Risks of the procedure include: bleeding, infection, difficulty with removal, scarring and nerve damage. There may be bruising at the site of incision and down the arm.  Procedure Note: Time out taken: ***  Team: (NAMES)  (PATIENT NAME) (PATIENT DOB) confirmed (YES/NO)  Procedure: Progestin Implant Removal  Procedure confirmed by patient and team (YES/NO)  Side: (RIGHT/LEFT)  Position correct for procedure (YES/NO)  Equipment for procedure available (YES/NO)  The patient is place in the supine position. Aseptic conditions are maintained. The rod is located by palpation. The area is cleaned with antiseptic. *** cc of 1% lidocaine with epinephrine is injected just underneath the end of the implant closest to the elbow. After firmly pressing down on the end of the implant closer to the axilla a 2-3 mm incision is made with a scalpel. The rod is pushed to the incision site and grasped with a mosquito forceps and gently removed. Blunt dissection (WAS/WAS NOT) needed. The patient (DID/DID NOT) tolerate the procedure well. The rod was removed in its entirety. The incision was dressed with a small adhesive bandage closure and a pressure dressing was applied. An alternate plan for contraception was discussed. The patient would like to use *** for her contraception.    (PROVIDER NAME AND TITLE)   Smoking status reviewed  Review of Systems Per HPI, also denies recent illness, fever, headache, changes in vision, chest pain, shortness of breath, abdominal pain, N/V/D, weakness   Patient Active Problem List   Diagnosis Date Noted  . Acquired dysmorphic toenail 06/16/2018  . Callus of foot 06/16/2018  . Folliculitis 06/16/2018  . Allergic rhinitis 06/03/2016  . Anxiety disorder in conditions classified elsewhere 03/05/2014  . Contraception management 05/11/2013  . Cannabis abuse 04/27/2013  . Dysuria 07/30/2010  . SCHIZOAFFECTIVE DISORDER 04/07/2010  . TOBACCO USER 10/27/2009  . Bipolar disorder (HCC) 11/16/2007  . HYPERLIPIDEMIA 12/22/2006  . OBESITY, NOS 12/22/2006  . Multiple sclerosis (HCC) 12/22/2006     Objective:  There were no vitals taken for this visit. Vitals and nursing note reviewed  General: NAD, pleasant Cardiac: RRR, normal heart sounds, no murmurs Respiratory: CTAB, normal effort Abdomen: soft, nontender, nondistended Extremities: no edema or cyanosis. WWP. Skin: warm and dry, no rashes noted Neuro: alert and oriented, no focal deficits Psych: normal affect  Assessment & Plan:    No problem-specific Assessment & Plan notes found for this encounter.    Leticia Penna, DO Family Medicine Resident PGY-1

## 2019-03-21 ENCOUNTER — Other Ambulatory Visit: Payer: Self-pay

## 2019-03-21 ENCOUNTER — Emergency Department (HOSPITAL_COMMUNITY)
Admission: EM | Admit: 2019-03-21 | Discharge: 2019-03-21 | Disposition: A | Discharge status: Home / Self Care | Payer: Medicare HMO | Attending: Emergency Medicine | Admitting: Emergency Medicine

## 2019-03-21 ENCOUNTER — Encounter (HOSPITAL_COMMUNITY): Payer: Self-pay | Admitting: Emergency Medicine

## 2019-03-21 ENCOUNTER — Emergency Department (HOSPITAL_COMMUNITY): Payer: Medicare HMO

## 2019-03-21 DIAGNOSIS — G35 Multiple sclerosis: Secondary | ICD-10-CM | POA: Insufficient documentation

## 2019-03-21 DIAGNOSIS — R112 Nausea with vomiting, unspecified: Secondary | ICD-10-CM | POA: Diagnosis not present

## 2019-03-21 DIAGNOSIS — R0789 Other chest pain: Secondary | ICD-10-CM | POA: Diagnosis not present

## 2019-03-21 DIAGNOSIS — Z20828 Contact with and (suspected) exposure to other viral communicable diseases: Secondary | ICD-10-CM | POA: Insufficient documentation

## 2019-03-21 DIAGNOSIS — J029 Acute pharyngitis, unspecified: Secondary | ICD-10-CM | POA: Diagnosis present

## 2019-03-21 DIAGNOSIS — Z79899 Other long term (current) drug therapy: Secondary | ICD-10-CM | POA: Insufficient documentation

## 2019-03-21 DIAGNOSIS — F250 Schizoaffective disorder, bipolar type: Secondary | ICD-10-CM | POA: Insufficient documentation

## 2019-03-21 DIAGNOSIS — R10817 Generalized abdominal tenderness: Secondary | ICD-10-CM | POA: Insufficient documentation

## 2019-03-21 DIAGNOSIS — F17210 Nicotine dependence, cigarettes, uncomplicated: Secondary | ICD-10-CM | POA: Insufficient documentation

## 2019-03-21 LAB — COMPREHENSIVE METABOLIC PANEL
ALT: 32 U/L (ref 0–44)
AST: 30 U/L (ref 15–41)
Albumin: 3.7 g/dL (ref 3.5–5.0)
Alkaline Phosphatase: 61 U/L (ref 38–126)
Anion gap: 13 (ref 5–15)
BUN: 10 mg/dL (ref 6–20)
CO2: 20 mmol/L — ABNORMAL LOW (ref 22–32)
Calcium: 9.1 mg/dL (ref 8.9–10.3)
Chloride: 102 mmol/L (ref 98–111)
Creatinine, Ser: 1.05 mg/dL — ABNORMAL HIGH (ref 0.44–1.00)
GFR calc Af Amer: >60 mL/min (ref >60)
GFR calc non Af Amer: >60 mL/min (ref >60)
Glucose, Bld: 124 mg/dL — ABNORMAL HIGH (ref 70–99)
Potassium: 3.2 mmol/L — ABNORMAL LOW (ref 3.5–5.1)
Sodium: 135 mmol/L (ref 135–145)
Total Bilirubin: 0.6 mg/dL (ref 0.3–1.2)
Total Protein: 7.7 g/dL (ref 6.5–8.1)

## 2019-03-21 LAB — CBC WITH DIFFERENTIAL/PLATELET
Abs Immature Granulocytes: 0.05 10*3/uL (ref 0.00–0.07)
Basophils Absolute: 0 10*3/uL (ref 0.0–0.1)
Basophils Relative: 0 %
Eosinophils Absolute: 0.1 10*3/uL (ref 0.0–0.5)
Eosinophils Relative: 1 %
HCT: 43.2 % (ref 36.0–46.0)
Hemoglobin: 14.1 g/dL (ref 12.0–15.0)
Immature Granulocytes: 1 %
Lymphocytes Relative: 11 %
Lymphs Abs: 1.1 10*3/uL (ref 0.7–4.0)
MCH: 29.3 pg (ref 26.0–34.0)
MCHC: 32.6 g/dL (ref 30.0–36.0)
MCV: 89.6 fL (ref 80.0–100.0)
Monocytes Absolute: 1.1 10*3/uL — ABNORMAL HIGH (ref 0.1–1.0)
Monocytes Relative: 11 %
Neutro Abs: 8 10*3/uL — ABNORMAL HIGH (ref 1.7–7.7)
Neutrophils Relative %: 76 %
Platelets: 295 10*3/uL (ref 150–400)
RBC: 4.82 MIL/uL (ref 3.87–5.11)
RDW: 13 % (ref 11.5–15.5)
WBC: 10.4 10*3/uL (ref 4.0–10.5)
nRBC: 0 % (ref 0.0–0.2)

## 2019-03-21 LAB — URINALYSIS, ROUTINE W REFLEX MICROSCOPIC
Bilirubin Urine: NEGATIVE
Glucose, UA: NEGATIVE mg/dL
Ketones, ur: 20 mg/dL — AB
Leukocytes,Ua: NEGATIVE
Nitrite: NEGATIVE
Protein, ur: 100 mg/dL — AB
Specific Gravity, Urine: 1.024 (ref 1.005–1.030)
pH: 5 (ref 5.0–8.0)

## 2019-03-21 LAB — SARS CORONAVIRUS 2 BY RT PCR (HOSPITAL ORDER, PERFORMED IN ~~LOC~~ HOSPITAL LAB): SARS Coronavirus 2: NEGATIVE

## 2019-03-21 LAB — GROUP A STREP BY PCR: Group A Strep by PCR: NOT DETECTED

## 2019-03-21 MED ORDER — SODIUM CHLORIDE 0.9 % IV BOLUS
1000.0000 mL | Freq: Once | INTRAVENOUS | Status: AC
  Administered 2019-03-21: 19:00:00 1000 mL via INTRAVENOUS

## 2019-03-21 MED ORDER — ONDANSETRON HCL 4 MG/2ML IJ SOLN
4.0000 mg | Freq: Once | INTRAMUSCULAR | Status: AC
  Administered 2019-03-21: 19:00:00 4 mg via INTRAVENOUS
  Filled 2019-03-21: qty 2

## 2019-03-21 MED ORDER — ACETAMINOPHEN 500 MG PO TABS
1000.0000 mg | ORAL_TABLET | Freq: Once | ORAL | Status: AC
  Administered 2019-03-21: 18:00:00 1000 mg via ORAL
  Filled 2019-03-21: qty 2

## 2019-03-21 MED ORDER — ONDANSETRON 4 MG PO TBDP: 4.0000 mg | ORAL_TABLET | Freq: Three times a day (TID) | ORAL | 0 refills | Status: DC | PRN

## 2019-03-21 MED ORDER — CLINDAMYCIN PHOSPHATE 600 MG/50ML IV SOLN
600.0000 mg | Freq: Once | INTRAVENOUS | Status: AC
  Administered 2019-03-21: 21:00:00 600 mg via INTRAVENOUS
  Filled 2019-03-21: qty 50

## 2019-03-21 MED ORDER — CLINDAMYCIN HCL 150 MG PO CAPS: 300.0000 mg | ORAL_CAPSULE | Freq: Four times a day (QID) | ORAL | 0 refills | Status: AC

## 2019-03-21 MED ORDER — DEXAMETHASONE SODIUM PHOSPHATE 10 MG/ML IJ SOLN
10.0000 mg | Freq: Once | INTRAMUSCULAR | Status: AC
  Administered 2019-03-21: 10 mg via INTRAMUSCULAR
  Filled 2019-03-21: qty 1
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
## 2019-03-21 NOTE — ED Notes (Signed)
Pt aware a urine sample is needed, will provide when able.

## 2019-03-21 NOTE — ED Notes (Signed)
Discharge instructions and prescriptions discussed with Pt. Pt verbalized understanding. Pt stable and ambulatory.  This RN contacted father who will pick pt up.

## 2019-03-21 NOTE — ED Provider Notes (Signed)
21:00: Assumed care of patient from [facility] PA-C at change of shift pending UA. If no obvious UTI discharge home.   UA w/ rare bacteria- sent for culture, do not feel that tx for UTI is necessary at this time. Hematuria, ketonuria, and proteinuria- received fluids in the ED, will need PCP recheck  Discharge instructions per prior team.     Cherly Anderson, PA-C 03/21/19 2308    Wynetta Fines, MD 03/27/19 [phone]

## 2019-03-21 NOTE — ED Triage Notes (Signed)
Pt has been unable to eat for 3 days due to inability to swallow and a sore throat.  Nasal congestion, emesis after eating.

## 2019-03-21 NOTE — ED Notes (Signed)
Patient had an emesis provider notified.

## 2019-03-21 NOTE — Discharge Instructions (Addendum)
Please take all of your antibiotics until finished!  Stay hydrated.   Alternate between Tylenol and ibuprofen as needed for fever/pain.   Zofran as needed for nausea.   Call your doctor tomorrow morning to schedule a follow up appointment.   Return to ER if you are unable to keep any fluids down despite nausea medication, trouble swallowing your own spit, new or worsening symptoms develop, any additional concerns.

## 2019-03-22 ENCOUNTER — Telehealth (INDEPENDENT_AMBULATORY_CARE_PROVIDER_SITE_OTHER): Payer: Medicare HMO | Admitting: Family Medicine

## 2019-03-22 DIAGNOSIS — J029 Acute pharyngitis, unspecified: Secondary | ICD-10-CM

## 2019-03-22 NOTE — Assessment & Plan Note (Signed)
Based on ED clinical evaluation.  Difficult to evaluate due to poor reception with telehealth visit.  Vomiting seems to have resolved and patient is tolerating oral intake.  No red flags. - Advised patient to pick up clindamycin prescription and complete course of treatment - Discussed conservative management and pain control with Tylenol or NSAIDs - Reviewed return precautions, RTC as needed

## 2019-03-22 NOTE — Progress Notes (Signed)
[facility] Telemedicine Visit  Patient consented to have virtual visit. Method of visit: Video was attempted, but technology challenges prevented patient from using video, so visit was conducted via telephone.  Encounter participants: Patient: Allison Moore - located at home Provider: Wendee Beavers - located at office  Chief Complaint: "I have strep throat"  HPI:  [name] is following up for her scheduled appointment for a sore throat.  She did go to the emergency room due to not being able to be seen yesterday.  Patient went to ED on 5/27 and was given clindamycin for exudative pharyngitis.  Pertinent labs include reassuring CBC, negative strep PCR, negative COVID, stable CMET with slight hypokalemia 3.2, and UA with rare bacteria and large hemoglobin.  Patient reports history of fevers (102F), sore throat, and occasional vomiting with decreased appetite for 4 days. Patient reports vomiting has resolved. She was able to eat chicken soup since leaving the ED. Her father has been unable to pick up the medication until he leaves for work and will bring it home tonight. She cannot drive and lives with only her father. She denies chest pain or SOB. She denies COVID contact. She has not tried Tylenol.  She denies urinary symptoms including dysuria, flank pain.  ROS: per HPI  Pertinent PMHx: Reviewed.  Exam:  Respiratory: no increased work of breathing.  Assessment/Plan:  Exudative pharyngitis Based on ED clinical evaluation.  Difficult to evaluate due to poor reception with telehealth visit.  Vomiting seems to have resolved and patient is tolerating oral intake.  No red flags. - Advised patient to pick up clindamycin prescription and complete course of treatment - Discussed conservative management and pain control with Tylenol or NSAIDs - Reviewed return precautions, RTC as needed    Time spent during visit with patient: 10 minutes

## 2019-05-29 ENCOUNTER — Ambulatory Visit: Payer: Medicare HMO | Admitting: Family Medicine

## 2019-05-31 ENCOUNTER — Ambulatory Visit: Payer: Medicare HMO | Admitting: Podiatry

## 2019-06-12 ENCOUNTER — Encounter: Payer: Self-pay | Admitting: Family Medicine

## 2019-06-15 ENCOUNTER — Ambulatory Visit (INDEPENDENT_AMBULATORY_CARE_PROVIDER_SITE_OTHER): Payer: Medicare HMO | Admitting: Family Medicine

## 2019-06-15 ENCOUNTER — Other Ambulatory Visit: Payer: Self-pay

## 2019-06-15 ENCOUNTER — Encounter: Payer: Self-pay | Admitting: Family Medicine

## 2019-06-15 DIAGNOSIS — R21 Rash and other nonspecific skin eruption: Secondary | ICD-10-CM | POA: Diagnosis not present

## 2019-06-15 MED ORDER — TRIAMCINOLONE ACETONIDE 0.5 % EX OINT: 1.0000 "application " | TOPICAL_OINTMENT | Freq: Two times a day (BID) | CUTANEOUS | 3 refills | Status: DC

## 2019-06-15 NOTE — Assessment & Plan Note (Signed)
Ringworm vs eczema. Presentation of symptoms more consistent with eczema and given it is scaly and a plaque I will treat with a topical steroid cream. If no improvement I would stop steroid and treat for fungal infection. - Triamcinolone cream 0.5% - Good skin hygiene

## 2019-06-15 NOTE — Patient Instructions (Addendum)

## 2019-06-15 NOTE — Progress Notes (Signed)
     Subjective: Chief Complaint  Patient presents with  . Rash    HPI: Allison Moore is a 38 y.o. presenting to clinic today to discuss the following:  Rash Patient endorses itchy, plaque, rash for the past 2 months. She hasn't tried anything to get rid of it. She has just dealt with it but now seems to be spreading and itching more. It is located on the tops of her feet, her right thigh, and her right arm.  No fever, chills, cough, abdominal pain, nausea, or vomiting.     ROS noted in HPI.   Past Medical, Surgical, Social, and Family History Reviewed & Updated per EMR.   Pertinent Historical Findings include:   Social History   Tobacco Use  Smoking Status Heavy Tobacco Smoker  . Packs/day: 1.00  . Years: 11.00  . Pack years: 11.00  . Types: Cigarettes  Smokeless Tobacco Former Systems developer  . Quit date: 04/10/2013   Objective: BP 121/80   Pulse 83   Wt 233 lb 9.6 oz (106 kg)   SpO2 100%   BMI 40.10 kg/m  Vitals and nursing notes reviewed  Physical Exam Gen: Alert and Oriented x 3, NAD HEENT: Normocephalic, atraumatic Ext: no clubbing, cyanosis, or edema Skin: warm, dry, intact, no rashes       Assessment/Plan:  Rash Ringworm vs eczema. Presentation of symptoms more consistent with eczema and given it is scaly and a plaque I will treat with a topical steroid cream. If no improvement I would stop steroid and treat for fungal infection. - Triamcinolone cream 0.5% - Good skin hygiene   PATIENT EDUCATION PROVIDED: See AVS    Diagnosis and plan along with any newly prescribed medication(s) were discussed in detail with this patient today. The patient verbalized understanding and agreed with the plan. Patient advised if symptoms worsen return to clinic or ER.   Meds ordered this encounter  Medications  . triamcinolone ointment (KENALOG) 0.5 %    Sig: Apply 1 application topically 2 (two) times daily. For moderate to severe eczema.  Do not use for more than  1 week at a time.    Dispense:  60 g    Refill:  [place], DO 06/15/2019, 2:09 PM PGY-3 [place]

## 2019-07-10 ENCOUNTER — Encounter: Payer: Self-pay | Admitting: Family Medicine

## 2019-07-10 ENCOUNTER — Ambulatory Visit (INDEPENDENT_AMBULATORY_CARE_PROVIDER_SITE_OTHER): Payer: Medicare HMO | Admitting: Family Medicine

## 2019-07-10 ENCOUNTER — Other Ambulatory Visit: Payer: Self-pay

## 2019-07-10 VITALS — BP 122/88 | HR 78 | Ht 64.0 in | Wt 229.0 lb

## 2019-07-10 DIAGNOSIS — Z308 Encounter for other contraceptive management: Secondary | ICD-10-CM | POA: Diagnosis not present

## 2019-07-10 NOTE — Assessment & Plan Note (Signed)
Counseled patient extensively that her pregnancy would be considered high risk due to her age and comorbidities.  Told the patient that I will ultimately do what she requests, but I would like for her to think very seriously about the consequences of pregnancy and raising a child.  Unfortunately, we were unable to remove her Nexplanon today because we could not palpate it, so she will be scheduled for ultrasound visualization and removal at our GYN clinic.  I encouraged patient to let us know if she changes her mind about discontinuing birth control.  I also encouraged her to quit smoking before she attempts to get pregnant.

## 2019-07-10 NOTE — Progress Notes (Signed)
   Subjective:    Allison Moore - 38 y.o. female MRN [id number]  Date of birth: [birth year]  CC:  Allison Moore is here for a Nexplanon removal.  HPI: Patient reports that she would like her Nexplanon removed today because she and her boyfriend have decided to try to get pregnant.  She says that the Nexplanon has worked well for her.  She is on the disability but lives alone.  She says that she has adequate support for pregnancy and for raising a child.  She has been wanting to have her Nexplanon removed in order to get pregnant for about a year.  She continues to smoke.  She no longer takes her Risperdal for her previously diagnosed schizoaffective disorder and does not see a psychiatrist.  She also does not take any medications for her MS.  She denies current hallucinations or neurologic symptoms.  She denies irregular menstrual bleeding.  Health Maintenance:  There are no preventive care reminders to display for this patient.  -  reports that she has been smoking cigarettes. She has a 11.00 pack-year smoking history. She quit smokeless tobacco use about 6 years ago. - Review of Systems: Per HPI. - Past Medical History: Patient Active Problem List   Diagnosis Date Noted  . Rash 06/15/2019  . Acquired dysmorphic toenail 06/16/2018  . Allergic rhinitis 06/03/2016  . Anxiety disorder in conditions classified elsewhere 03/05/2014  . Contraception management 05/11/2013  . Cannabis abuse 04/27/2013  . Dysuria 07/30/2010  . SCHIZOAFFECTIVE DISORDER 04/07/2010  . History of tobacco abuse 10/27/2009  . Bipolar disorder ([place]) 11/16/2007  . HYPERLIPIDEMIA 12/22/2006  . OBESITY, NOS 12/22/2006  . Multiple sclerosis ([place]) 12/22/2006   - Medications: reviewed and updated   Objective:   Physical Exam BP 122/88   Pulse 78   Ht 5\' 4"  (1.626 m)   Wt 229 lb (103.9 kg)   SpO2 99%   BMI 39.31 kg/m  Gen: NAD, alert, cooperative with exam, obese Skin: Large left arm, unable to palpate  Nexplanon Neuro: no gross deficits.  Psych: Flat affect        Assessment & Plan:   Contraception management Counseled patient extensively that her pregnancy would be considered high risk due to her age and comorbidities.  Told the patient that I will ultimately do what she requests, but I would like for her to think very seriously about the consequences of pregnancy and raising a child.  Unfortunately, we were unable to remove her Nexplanon today because we could not palpate it, so she will be scheduled for ultrasound visualization and removal at our GYN clinic.  I encouraged patient to let us know if she changes her mind about discontinuing birth control.  I also encouraged her to quit smoking before she attempts to get pregnant.    Maia Breslow, M.D. 07/10/2019, 2:40 PM PGY-3, [place]

## 2019-07-10 NOTE — Patient Instructions (Signed)
It was nice seeing you today Allison Moore!  Unfortunately, we could not find your Nexplanon in your arm today.  We need to bring you in to our GYN clinic where they can use an ultrasound to locate the Nexplanon and remove it.  We will schedule you at our earliest GYN clinic to have this done.  In the meantime, please continue to think about what pregnancy would mean for your health.  Unfortunately, pregnancy in women ages greater than 60 are at high risk for complications, including premature birth, Down syndrome, diabetes, and high blood pressure that can be very dangerous and even cause death in pregnancy.  I am also worried that your other medical conditions could be worsened by pregnancy.  If you change your mind I do not want your Nexplanon out or want a new one placed, please let us know and we will be happy to do that.  Ultimately however we will take it out if you would like for [place] to and if you understand these potential risks.  If you have any questions or concerns, please feel free to call the clinic.   Be well,  Dr. Shan Levans

## 2019-07-12 ENCOUNTER — Ambulatory Visit: Payer: Medicare HMO

## 2019-08-03 ENCOUNTER — Other Ambulatory Visit: Payer: Self-pay

## 2019-08-03 ENCOUNTER — Ambulatory Visit (INDEPENDENT_AMBULATORY_CARE_PROVIDER_SITE_OTHER): Payer: Medicare HMO | Admitting: Family Medicine

## 2019-08-03 VITALS — BP 110/78 | HR 87 | Ht 64.0 in | Wt 230.5 lb

## 2019-08-03 DIAGNOSIS — N912 Amenorrhea, unspecified: Secondary | ICD-10-CM | POA: Diagnosis not present

## 2019-08-03 DIAGNOSIS — R1084 Generalized abdominal pain: Secondary | ICD-10-CM | POA: Insufficient documentation

## 2019-08-03 LAB — POCT URINE PREGNANCY: Preg Test, Ur: NEGATIVE

## 2019-08-03 NOTE — Assessment & Plan Note (Signed)
U preg negative and patient with nexplanon still in place and not expired.  Advised of this.  Stomach pain could be 2/2 dyspepsia or viral gastroenteritis.  Abdominal exam benign and no fevers, therefore doubt cause that would require surgery or immediate intervention.  Advised can try OTC tums or Pepcid and if pain does not improve in 3 days, to call to make another appointment.

## 2019-08-03 NOTE — Progress Notes (Signed)
     Subjective: Chief Complaint  Patient presents with  . Possible Pregnancy     HPI: Allison Moore is a 38 y.o. presenting to clinic today to discuss the following:  1 Positive Home Pregnancy test Notes positive test 2 days ago LMP about 1 month ago, unsure when  Birth control: nexplanon placed 01/2017, has not been removed, is scheduled for appointment in few weeks for removal under [place] Desires to keep pregnancy if pregnant Has been having abdominal pain in the center of her abdomen for a few weeks.  Had 1 episode of vomiting yesterday.  Had only episode, non-bloody.  No sick contacts.  No fevers. Endorses some breast tenderness.  No diarrhea. No nausea in AMs.   No vaginal bleeding noted No history of ectopic pregnancy      ROS noted in HPI. Chief complaint noted.  Other Pertinent PMH: MS, Schizoaffective Disorder, Bipolar Disorder, Obesity Past Medical, Surgical, Social, and Family History Reviewed & Updated per EMR.      Social History   Tobacco Use  Smoking Status Heavy Tobacco Smoker  . Packs/day: 1.00  . Years: 11.00  . Pack years: 11.00  . Types: Cigarettes  Smokeless Tobacco Former Systems developer  . Quit date: 04/10/2013   Smoking status noted.    Objective: BP 110/78   Pulse 87   Ht 5\' 4"  (1.626 m)   Wt 230 lb 8 oz (104.6 kg)   LMP 07/04/2019   SpO2 98%   BMI 39.57 kg/m  Vitals and nursing notes reviewed  Physical Exam:  General: 38 y.o. female in NAD Cardio: RRR no m/r/g Lungs: CTAB, no wheezing, no rhonchi, no crackles, no IWOB on RA Abdomen: Soft, non-tender to palpation, non-distended, positive bowel sounds Skin: warm and dry Extremities: No edema   Results for orders placed or performed in visit on 08/03/19 (from the past 72 hour(s))  POCT urine pregnancy     Status: None   Collection Time: 08/03/19 11:22 AM  Result Value Ref Range   Preg Test, Ur Negative Negative    Assessment/Plan:  Generalized abdominal pain U preg negative and  patient with nexplanon still in place and not expired.  Advised of this.  Stomach pain could be 2/2 dyspepsia or viral gastroenteritis.  Abdominal exam benign and no fevers, therefore doubt cause that would require surgery or immediate intervention.  Advised can try OTC tums or Pepcid and if pain does not improve in 3 days, to call to make another appointment.     PATIENT EDUCATION PROVIDED: See AVS    Diagnosis and plan along with any newly prescribed medication(s) were discussed in detail with this patient today. The patient verbalized understanding and agreed with the plan. Patient advised if symptoms worsen return to clinic or ER.    Orders Placed This Encounter  Procedures  . POCT urine pregnancy    No orders of the defined types were placed in this encounter.    [place] Constable, DO 08/03/2019, 1:35 PM PGY-2 [place]

## 2019-08-13 NOTE — Progress Notes (Deleted)
   Subjective:    Patient ID: Allison Moore, female    DOB: [birth year], 38 y.o.   MRN: [id number]   CC:  HPI:  Recently seen on 10/9 for possible pregnancy test.  Had a positive home pregnancy test.  At that time U pregnant in office was negative and patient had Nexplanon in place.  Abdominal pain was thought to be secondary to gastroenteritis or dyspepsia.  Smoking status reviewed  Review of Systems   Objective:  There were no vitals taken for this visit. Vitals and nursing note reviewed  General: well nourished, in no acute distress HEENT: normocephalic, TM's visualized bilaterally, no scleral icterus or conjunctival pallor, no nasal discharge, moist mucous membranes, good dentition without erythema or discharge noted in posterior oropharynx Neck: supple, non-tender, without lymphadenopathy Cardiac: RRR, clear S1 and S2, no murmurs, rubs, or gallops Respiratory: clear to auscultation bilaterally, no increased work of breathing Abdomen: soft, nontender, nondistended, no masses or organomegaly. Bowel sounds present Extremities: no edema or cyanosis. Warm, well perfused. 2+ radial and PT pulses bilaterally Skin: warm and dry, no rashes noted Neuro: alert and oriented, no focal deficits   Assessment & Plan:    No problem-specific Assessment & Plan notes found for this encounter.    No follow-ups on file.   Caroline More, DO, PGY-3

## 2019-08-14 ENCOUNTER — Ambulatory Visit: Payer: Medicare HMO | Admitting: Family Medicine

## 2019-08-15 NOTE — Progress Notes (Deleted)
Subjective:    Patient ID: Allison Moore, female    DOB: [birth year], 38 y.o.   MRN: [id number]   CC:  HPI: GYNECOLOGY CLINIC PROCEDURE NOTE  Allison Moore is a 38 y.o. G2P1011 presenting to colposcopy clinic for Nexplanon removal*** Nexplanon insertion. Nexplanon placed on 02/03/17 by her gynecologist. No GYN concerns.  Last pap smear was on 02/20/18 and was normal.  No other gynecologic concerns.  Patient seen by PCP on 07/10/19 to discuss removal. At that time patient was counseled that pregnancy would be high risk given age and comorbidities. Patient currently on disability and lives alone. Current smoker of ***PPD. H/o schizoaffective disorder and does not see psychiatry. H/o MS without medications.   Nexplanon Removal Patient was given informed consent for removal of her Nexplanon.  Appropriate time out taken. Nexplanon site identified.  Area prepped in usual sterile fashon. One ml of 1% lidocaine was used to anesthetize the area at the distal end of the implant. A small stab incision was made right beside the implant on the distal portion.  The Nexplanon rod was grasped using hemostats and removed without difficulty.  There was minimal blood loss. There were no complications.  A small amount of antibiotic ointment and steri-strips were applied over the small incision.  A pressure bandage was applied to reduce any bruising.  The patient tolerated the procedure well and was given post procedure instructions.  Patient is planning to use *** for contraception/attempt conception.  Nexplanon Removal and Insertion  Patient was given informed consent for removal of her Implanon and insertion of Nexplanon.  Patient does understand that irregular bleeding is a very common side effect of this medication. She was advised to have backup contraception for one week after replacement of the implant. Pregnancy test in clinic today was negative.  Appropriate time out taken. Implanon site identified.  Area prepped in usual sterile fashon. One ml of 1% lidocaine was used to anesthetize the area at the distal end of the implant. A small stab incision was made right beside the implant on the distal portion. The Nexplanon rod was grasped using hemostats and removed without difficulty. There was minimal blood loss. There were no complications. Area was then injected with 3 ml of 1 % lidocaine. She was re-prepped with betadine, Nexplanon removed from packaging, Device confirmed in needle, then inserted full length of needle and withdrawn per handbook instructions. Nexplanon was able to palpated in the patient's arm; patient palpated the insert herself.  There was minimal blood loss. Patient insertion site covered with guaze and a pressure bandage to reduce any bruising. The patient tolerated the procedure well and was given post procedure instructions.    Smoking status reviewed  Review of Systems   Objective:  There were no vitals taken for this visit. Vitals and nursing note reviewed  General: well nourished, in no acute distress HEENT: normocephalic, TM's visualized bilaterally, no scleral icterus or conjunctival pallor, no nasal discharge, moist mucous membranes, good dentition without erythema or discharge noted in posterior oropharynx Neck: supple, non-tender, without lymphadenopathy Cardiac: RRR, clear S1 and S2, no murmurs, rubs, or gallops Respiratory: clear to auscultation bilaterally, no increased work of breathing Abdomen: soft, nontender, nondistended, no masses or organomegaly. Bowel sounds present Extremities: no edema or cyanosis. Warm, well perfused. 2+ radial and PT pulses bilaterally Skin: warm and dry, no rashes noted Neuro: alert and oriented, no focal deficits   Assessment & Plan:    No problem-specific Assessment & Plan notes found  for this encounter.    No follow-ups on file.   Oralia Manis, DO, PGY-3

## 2019-08-16 ENCOUNTER — Emergency Department (HOSPITAL_COMMUNITY)
Admission: EM | Admit: 2019-08-16 | Discharge: 2019-08-16 | Disposition: A | Discharge status: Home / Self Care | Payer: Medicare HMO | Attending: Emergency Medicine | Admitting: Emergency Medicine

## 2019-08-16 ENCOUNTER — Ambulatory Visit: Payer: Medicare HMO

## 2019-08-16 ENCOUNTER — Other Ambulatory Visit: Payer: Self-pay

## 2019-08-16 DIAGNOSIS — F17210 Nicotine dependence, cigarettes, uncomplicated: Secondary | ICD-10-CM | POA: Diagnosis not present

## 2019-08-16 DIAGNOSIS — F1210 Cannabis abuse, uncomplicated: Secondary | ICD-10-CM | POA: Insufficient documentation

## 2019-08-16 DIAGNOSIS — R1084 Generalized abdominal pain: Secondary | ICD-10-CM | POA: Diagnosis not present

## 2019-08-16 DIAGNOSIS — Z3202 Encounter for pregnancy test, result negative: Secondary | ICD-10-CM | POA: Insufficient documentation

## 2019-08-16 DIAGNOSIS — Z79899 Other long term (current) drug therapy: Secondary | ICD-10-CM | POA: Diagnosis not present

## 2019-08-16 DIAGNOSIS — Z3200 Encounter for pregnancy test, result unknown: Secondary | ICD-10-CM | POA: Diagnosis present

## 2019-08-16 LAB — URINALYSIS, ROUTINE W REFLEX MICROSCOPIC
Bacteria, UA: NONE SEEN
Bilirubin Urine: NEGATIVE
Glucose, UA: NEGATIVE mg/dL
Ketones, ur: NEGATIVE mg/dL
Leukocytes,Ua: NEGATIVE
Nitrite: NEGATIVE
Protein, ur: 30 mg/dL — AB
Specific Gravity, Urine: 1.025 (ref 1.005–1.030)
pH: 6 (ref 5.0–8.0)

## 2019-08-16 LAB — POC URINE PREG, ED: Preg Test, Ur: NEGATIVE

## 2019-08-16 NOTE — ED Notes (Signed)
Pt states that she would like to be catheterized for urine test.  Advised her that this is not possible

## 2019-08-16 NOTE — ED Notes (Signed)
Patient verbalizes understanding of discharge instructions. Opportunity for questioning and answers were provided. Armband removed by staff, pt discharged from ED ambulatory.   

## 2019-08-16 NOTE — Discharge Instructions (Signed)
Your urine is not showing signs of infection. Your pregnancy test is negative. Please follow up with your primary care provider regarding your visit today.

## 2019-08-16 NOTE — ED Triage Notes (Signed)
Pt here for pregnancy test, positive result at home.
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
## 2019-09-10 ENCOUNTER — Other Ambulatory Visit: Payer: Self-pay

## 2019-09-10 ENCOUNTER — Encounter (HOSPITAL_COMMUNITY): Payer: Self-pay | Admitting: Emergency Medicine

## 2019-09-10 ENCOUNTER — Emergency Department (HOSPITAL_COMMUNITY)
Admission: EM | Admit: 2019-09-10 | Discharge: 2019-09-11 | Disposition: A | Discharge status: Home / Self Care | Payer: Medicare HMO | Attending: Emergency Medicine | Admitting: Emergency Medicine

## 2019-09-10 DIAGNOSIS — Z3200 Encounter for pregnancy test, result unknown: Secondary | ICD-10-CM | POA: Diagnosis present

## 2019-09-10 DIAGNOSIS — R109 Unspecified abdominal pain: Secondary | ICD-10-CM | POA: Insufficient documentation

## 2019-09-10 DIAGNOSIS — F17210 Nicotine dependence, cigarettes, uncomplicated: Secondary | ICD-10-CM | POA: Insufficient documentation

## 2019-09-10 DIAGNOSIS — Z79899 Other long term (current) drug therapy: Secondary | ICD-10-CM | POA: Insufficient documentation

## 2019-09-10 DIAGNOSIS — N926 Irregular menstruation, unspecified: Secondary | ICD-10-CM | POA: Insufficient documentation

## 2019-09-10 DIAGNOSIS — Z3202 Encounter for pregnancy test, result negative: Secondary | ICD-10-CM

## 2019-09-10 DIAGNOSIS — R197 Diarrhea, unspecified: Secondary | ICD-10-CM | POA: Insufficient documentation

## 2019-09-10 DIAGNOSIS — G35 Multiple sclerosis: Secondary | ICD-10-CM | POA: Diagnosis not present

## 2019-09-10 NOTE — ED Triage Notes (Signed)
Patient states she wants a pregnancy test. Patient has no other complaints or request.
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
## 2019-09-11 ENCOUNTER — Telehealth: Payer: Self-pay | Admitting: Family Medicine

## 2019-09-11 LAB — PREGNANCY, URINE: Preg Test, Ur: NEGATIVE

## 2019-09-11 NOTE — Telephone Encounter (Signed)
Pt is requesting to get a blood test.  [phone]

## 2019-09-11 NOTE — Discharge Instructions (Addendum)
Your pregnancy test today was negative. In the future, you may purchase urine pregnancy test over-the-counter at all drug stores.   Steps to find a Primary Care Provider (PCP):  Call [phone] or [phone] to access "[place] a Doctor Service."  2.  You may also go on the [facility] website at [email or web address]  3.  [facility] also frequently accepts new patients.  [place] [phone]  4.  There are also multiple [facility], [facility] and [facility] practices throughout the [place] that are frequently accepting new patients. You may find a clinic that is close to your home and contact them.  [facility] [email or web address] [phone]  [facility] [email or web address]  [facility] [email or web address] [place] [email or web address] [phone]  5.  Local Health Departments also can provide primary care services.  [facility]  [place] [address] [phone]  [facility] [place] [address] [place] [phone]

## 2019-09-13 NOTE — Telephone Encounter (Signed)
Spoke to pt. She would like to get a pregnancy blood test. Pt said she was tested at the hospital but she feels like the result was wrong. Pt has tender breast and abdomin. Ottis Stain, CMA

## 2019-09-14 ENCOUNTER — Other Ambulatory Visit: Payer: Self-pay | Admitting: Family Medicine

## 2019-09-14 DIAGNOSIS — R1084 Generalized abdominal pain: Secondary | ICD-10-CM

## 2019-09-14 NOTE — Telephone Encounter (Signed)
Pt informed of below.Allison Moore, CMA ? ?

## 2019-09-14 NOTE — Telephone Encounter (Signed)
Please let Ms. Runco know that she is welcome to get a blood test for pregnancy at her appointment with Dr. Criss Rosales on Monday.  I will place the order.

## 2019-09-17 ENCOUNTER — Ambulatory Visit: Payer: Medicare HMO | Admitting: Family Medicine

## 2019-09-18 ENCOUNTER — Ambulatory Visit (INDEPENDENT_AMBULATORY_CARE_PROVIDER_SITE_OTHER): Payer: Medicare HMO | Admitting: Family Medicine

## 2019-09-18 ENCOUNTER — Other Ambulatory Visit: Payer: Self-pay

## 2019-09-18 ENCOUNTER — Encounter: Payer: Self-pay | Admitting: Family Medicine

## 2019-09-18 VITALS — BP 128/92 | HR 88 | Wt 224.8 lb

## 2019-09-18 DIAGNOSIS — Z3202 Encounter for pregnancy test, result negative: Secondary | ICD-10-CM | POA: Diagnosis not present

## 2019-09-18 DIAGNOSIS — Z23 Encounter for immunization: Secondary | ICD-10-CM | POA: Diagnosis not present

## 2019-09-18 LAB — POCT URINE PREGNANCY: Preg Test, Ur: NEGATIVE

## 2019-09-18 NOTE — Progress Notes (Signed)
    Subjective:  Allison Moore is a 38 y.o. female who presents to the [facility] today with a chief complaint of encounter for pregnancy test.   HPI: Encounter for pregnancy test with result negative Patient had a recent urine pregnancy test in the hospital, she has been trying for 2 months to get pregnant.  She says she is safe and no one is hurting her.  She said that she does not think that the urine pregnancy test was correct and she wants a blood pregnancy test.  Told her we were willing to order that but I could not guarantee that it would be covered by insurance as the urine pregnancy test is generally regarded to be very reliable.  When she was told of the potential cost of $30-$40 she decided she would go ahead and take a urine pregnancy test Objective:  Physical Exam: BP (!) 128/92   Pulse 88   Wt 224 lb 12.8 oz (102 kg)   SpO2 100%   BMI 38.59 kg/m   Gen: NAD, conversing comfortably pleasant but not processing the similarity between serum and urine suggest CV: RRR with no murmurs appreciated Pulm: NWOB, CTAB with no crackles, wheezes, or rhonchi GI: Normal bowel sounds present. Soft, Nontender, Nondistended. MSK: no edema, cyanosis, or clubbing noted Skin: warm, dry Neuro: grossly normal, moves all extremities Psych: Normal affect and thought content  Results for orders placed or performed in visit on 09/18/19 (from the past 72 hour(s))  POCT urine pregnancy     Status: None   Collection Time: 09/18/19 10:00 AM  Result Value Ref Range   Preg Test, Ur Negative Negative     Assessment/Plan:  Encounter for pregnancy test with result negative Patient had a recent urine pregnancy test in the hospital, she has been trying for 2 months to get pregnant.  She says she is safe and no one is hurting her.  She said that she does not think that the urine pregnancy test was correct and she wants a blood pregnancy test.  Told her we were willing to order that but I could not guarantee  that it would be covered by insurance as the urine pregnancy test is generally regarded to be very reliable.  When she was told of the potential cost of $30-$40 she decided she would go ahead and take a urine pregnancy test which was negative.  We did advise her that if she was going to try and pregnant that she should start taking a prenatal vitamin and avoid all smoking.  She was also instructed that should she decide she wants to try and not become pregnant that we can help her by prescribing birth control which she refused at this time.   Sherene Sires, DO FAMILY MEDICINE RESIDENT - PGY3 09/20/2019 3:28 PM

## 2019-09-18 NOTE — Assessment & Plan Note (Addendum)
Patient had a recent urine pregnancy test in the hospital, she has been trying for 2 months to get pregnant.  She says she is safe and no one is hurting her.  She said that she does not think that the urine pregnancy test was correct and she wants a blood pregnancy test.  Told her we were willing to order that but I could not guarantee that it would be covered by insurance as the urine pregnancy test is generally regarded to be very reliable.  When she was told of the potential cost of $30-$40 she decided she would go ahead and take a urine pregnancy test which was negative.  We did advise her that if she was going to try and pregnant that she should start taking a prenatal vitamin and avoid all smoking.  She was also instructed that should she decide she wants to try and not become pregnant that we can help her by prescribing birth control which she refused at this time.

## 2019-09-20 DIAGNOSIS — Z23 Encounter for immunization: Secondary | ICD-10-CM | POA: Insufficient documentation

## 2019-10-11 ENCOUNTER — Ambulatory Visit: Payer: Medicare HMO

## 2019-10-24 ENCOUNTER — Ambulatory Visit: Payer: Medicare HMO

## 2019-10-29 ENCOUNTER — Ambulatory Visit: Payer: Medicare HMO | Admitting: Family Medicine

## 2019-10-31 ENCOUNTER — Encounter: Payer: Self-pay | Admitting: Family Medicine

## 2019-10-31 ENCOUNTER — Ambulatory Visit (INDEPENDENT_AMBULATORY_CARE_PROVIDER_SITE_OTHER): Payer: Medicare HMO | Admitting: Family Medicine

## 2019-10-31 ENCOUNTER — Other Ambulatory Visit: Payer: Self-pay

## 2019-10-31 VITALS — BP 118/64 | HR 90 | Wt 231.6 lb

## 2019-10-31 DIAGNOSIS — G35 Multiple sclerosis: Secondary | ICD-10-CM

## 2019-10-31 DIAGNOSIS — Z308 Encounter for other contraceptive management: Secondary | ICD-10-CM | POA: Diagnosis not present

## 2019-10-31 DIAGNOSIS — Z3200 Encounter for pregnancy test, result unknown: Secondary | ICD-10-CM | POA: Diagnosis not present

## 2019-10-31 DIAGNOSIS — R7309 Other abnormal glucose: Secondary | ICD-10-CM

## 2019-10-31 LAB — POCT URINE PREGNANCY: Preg Test, Ur: NEGATIVE

## 2019-10-31 LAB — POCT GLYCOSYLATED HEMOGLOBIN (HGB A1C): Hemoglobin A1C: 5 % (ref 4.0–5.6)

## 2019-10-31 MED ORDER — NICOTINE 7 MG/24HR TD PT24: 7.0000 mg | MEDICATED_PATCH | Freq: Every day | TRANSDERMAL | 0 refills | Status: DC

## 2019-10-31 MED ORDER — PRENATAL VITAMINS 28-0.8 MG PO TABS: 1.0000 | ORAL_TABLET | Freq: Every day | ORAL | 0 refills | Status: DC

## 2019-10-31 NOTE — Patient Instructions (Signed)
Today we talked about your desire to become pregnant.  You have had multiple pregnancy test which have all been negative, this is very understandable as you still have a Nexplanon in your arm.  It does look like it has been attempted to be removed in the past and is difficult to find so we are referring you to the OB/GYN so that they can attempt to remove it.  We do not believe you become pregnant until this is out of your arm.  We sent a prescription to the [facility] outpatient pharmacy for nicotine patches to help you stop smoking which is important in pregnancy, we also think you should start taking prenatal vitamins.  We can prescribe these but you will also be able to pick them over-the-counter.  We are also referring you to neurology so that you can work on your MS management

## 2019-11-02 NOTE — Assessment & Plan Note (Signed)
Has been lost to f/u for MS for years now, refer to neuro to re-establish care as she is trying to get pregnant despite discussing risks

## 2019-11-02 NOTE — Assessment & Plan Note (Signed)
Referred to OB for nexplanon removal, instructed there was no need to verify negative home pregnancy tests at clinic while she has a nexplanon in

## 2019-11-02 NOTE — Progress Notes (Signed)
    Subjective:  Allison Moore is a 38 y.o. female who presents to the [facility] today with a chief complaint of wanting pregnancy test.   HPI: Patient says she has been trying to get pregnant for the last 3 to 4 months.  She has had multiple visits to healthcare providers to verify tests that have been negative at home.  After getting another test today if any further it was discovered that she has a Nexplanon in her arm.  She is over 3 years but due to body habitus but hard to get out and apparently she was referred to OB/GYN for this but never went.  Is fixated on pregnancy and didn't seem to understand that nexplanon was hindrance or that our tests were not different than tests at home  Objective:  Physical Exam: BP 118/64   Pulse 90   Wt 231 lb 9.6 oz (105.1 kg)   SpO2 100%   BMI 39.75 kg/m   Gen: NAD, conversing comfortably CV: Regular rate Pulmonology: No work of breathing *Nexplanon palpated in left arm after significant manipulation of fatty tissue Skin: warm, dry Neuro: grossly normal, moves all extremities Psych: fixated on pregnancy  Results for orders placed or performed in visit on 10/31/19 (from the past 72 hour(s))  POCT urine pregnancy     Status: None   Collection Time: 10/31/19 10:50 AM  Result Value Ref Range   Preg Test, Ur Negative Negative  HgB A1c     Status: None   Collection Time: 10/31/19 11:00 AM  Result Value Ref Range   Hemoglobin A1C 5.0 4.0 - 5.6 %   HbA1c POC (<> result, manual entry)     HbA1c, POC (prediabetic range)     HbA1c, POC (controlled diabetic range)       Assessment/Plan:  Contraception management Referred to OB for nexplanon removal, instructed there was no need to verify negative home pregnancy tests at clinic while she has a nexplanon in  Multiple sclerosis Has been lost to f/u for MS for years now, refer to neuro to re-establish care as she is trying to get pregnant despite discussing risks   Marthenia Rolling, DO FAMILY  MEDICINE RESIDENT - PGY3 11/02/2019 10:18 AM

## 2019-11-08 ENCOUNTER — Ambulatory Visit (INDEPENDENT_AMBULATORY_CARE_PROVIDER_SITE_OTHER): Payer: Medicare HMO | Admitting: Family Medicine

## 2019-11-08 DIAGNOSIS — Z716 Tobacco abuse counseling: Secondary | ICD-10-CM

## 2019-11-08 NOTE — Progress Notes (Signed)
   Subjective:    Patient ID: [name], female    DOB: 09-Jan-1981, 39 y.o.   MRN: [id number]   CC: Abnormal Uterine Bleeding for 2 weeks, Patient desires pregnancy  HPI:  PATIENT NO-SHOWED  No Shows: patient has had 7 no-shows within the last 6 months.    Dr. Peggyann Shoals [facility], PGY-2

## 2019-12-11 ENCOUNTER — Ambulatory Visit: Payer: Medicare HMO | Admitting: Family Medicine

## 2019-12-13 ENCOUNTER — Ambulatory Visit: Payer: Medicare HMO

## 2019-12-20 ENCOUNTER — Ambulatory Visit: Payer: Medicare HMO | Admitting: Family Medicine

## 2019-12-27 ENCOUNTER — Ambulatory Visit: Payer: Medicare HMO | Admitting: Family Medicine

## 2020-01-17 ENCOUNTER — Ambulatory Visit: Payer: Medicare HMO

## 2020-01-17 NOTE — Progress Notes (Deleted)
NEXPLANON REMOVAL Patient name: Allison Moore MRN [id number]  Date of birth: Jan 25, 1981 Subjective Findings:   Allison Moore is a 39 y.o. G2P1027female being seen today for removal of a Nexplanon. Her Nexplanon was placed 02/03/2017. She desires removal because ***. Signed copy of informed consent in chart.   No LMP recorded. Patient has had an implant. Last pap 01/2018. Results were:  normal The planned method of family planning is {contraception:315051} Pertinent History Reviewed:   Reviewed past medical,surgical, social, obstetrical and family history.  Reviewed problem list, medications and allergies. Objective Findings & Procedure:   There were no vitals filed for this visit.There is no height or weight on file to calculate BMI.  No results found for this or any previous visit (from the past 24 hour(s)).   Time out was performed.  Nexplanon site identified.  Area prepped in usual sterile fashon. One cc of 2% lidocaine was used to anesthetize the area at the distal end of the implant. A small stab incision was made right beside the implant on the distal portion.  The Nexplanon rod was grasped using hemostats and removed without difficulty.  There was less than 3 cc blood loss. There were no complications.  Steri-strips were applied over the small incision and a pressure bandage was applied.  The patient tolerated the procedure well. Assessment & Plan:   1) Nexplanon removal She was instructed to keep the area clean and dry, remove pressure bandage in 24 hours, and keep insertion site covered with the steri-strip for 3-5 days.   Follow-up PRN problems.  No orders of the defined types were placed in this encounter.   Follow-up: No follow-ups on file.  Allayne Stack DO 01/17/2020 10:46 AM

## 2020-01-22 ENCOUNTER — Encounter (HOSPITAL_COMMUNITY): Payer: Self-pay | Admitting: Obstetrics & Gynecology

## 2020-01-22 ENCOUNTER — Inpatient Hospital Stay (HOSPITAL_COMMUNITY): Payer: Medicare HMO

## 2020-01-22 ENCOUNTER — Inpatient Hospital Stay (HOSPITAL_COMMUNITY)
Admission: AD | Admit: 2020-01-22 | Discharge: 2020-01-22 | Disposition: A | Discharge status: Home / Self Care | Payer: Medicare HMO | Attending: Emergency Medicine | Admitting: Emergency Medicine

## 2020-01-22 ENCOUNTER — Other Ambulatory Visit: Payer: Self-pay

## 2020-01-22 DIAGNOSIS — F250 Schizoaffective disorder, bipolar type: Secondary | ICD-10-CM | POA: Insufficient documentation

## 2020-01-22 DIAGNOSIS — Z3202 Encounter for pregnancy test, result negative: Secondary | ICD-10-CM | POA: Diagnosis not present

## 2020-01-22 DIAGNOSIS — A599 Trichomoniasis, unspecified: Secondary | ICD-10-CM | POA: Insufficient documentation

## 2020-01-22 DIAGNOSIS — N939 Abnormal uterine and vaginal bleeding, unspecified: Secondary | ICD-10-CM | POA: Diagnosis not present

## 2020-01-22 DIAGNOSIS — R110 Nausea: Secondary | ICD-10-CM | POA: Diagnosis not present

## 2020-01-22 DIAGNOSIS — R05 Cough: Secondary | ICD-10-CM | POA: Insufficient documentation

## 2020-01-22 DIAGNOSIS — R059 Cough, unspecified: Secondary | ICD-10-CM

## 2020-01-22 DIAGNOSIS — F17210 Nicotine dependence, cigarettes, uncomplicated: Secondary | ICD-10-CM | POA: Insufficient documentation

## 2020-01-22 DIAGNOSIS — R9431 Abnormal electrocardiogram [ECG] [EKG]: Secondary | ICD-10-CM | POA: Diagnosis not present

## 2020-01-22 DIAGNOSIS — Z79899 Other long term (current) drug therapy: Secondary | ICD-10-CM | POA: Insufficient documentation

## 2020-01-22 DIAGNOSIS — R42 Dizziness and giddiness: Secondary | ICD-10-CM | POA: Insufficient documentation

## 2020-01-22 LAB — COMPREHENSIVE METABOLIC PANEL
ALT: 27 U/L (ref 0–44)
AST: 24 U/L (ref 15–41)
Albumin: 3.8 g/dL (ref 3.5–5.0)
Alkaline Phosphatase: 64 U/L (ref 38–126)
Anion gap: 9 (ref 5–15)
BUN: 15 mg/dL (ref 6–20)
CO2: 25 mmol/L (ref 22–32)
Calcium: 9.2 mg/dL (ref 8.9–10.3)
Chloride: 106 mmol/L (ref 98–111)
Creatinine, Ser: 0.73 mg/dL (ref 0.44–1.00)
GFR calc Af Amer: >60 mL/min (ref >60)
GFR calc non Af Amer: >60 mL/min (ref >60)
Glucose, Bld: 105 mg/dL — ABNORMAL HIGH (ref 70–99)
Potassium: 4 mmol/L (ref 3.5–5.1)
Sodium: 140 mmol/L (ref 135–145)
Total Bilirubin: 0.4 mg/dL (ref 0.3–1.2)
Total Protein: 7.3 g/dL (ref 6.5–8.1)

## 2020-01-22 LAB — URINALYSIS, ROUTINE W REFLEX MICROSCOPIC
Bacteria, UA: NONE SEEN
Bilirubin Urine: NEGATIVE
Glucose, UA: NEGATIVE mg/dL
Ketones, ur: NEGATIVE mg/dL
Leukocytes,Ua: NEGATIVE
Nitrite: NEGATIVE
Protein, ur: 30 mg/dL — AB
RBC / HPF: >50 RBC/hpf — ABNORMAL HIGH (ref 0–5)
Specific Gravity, Urine: 1.025 (ref 1.005–1.030)
pH: 5 (ref 5.0–8.0)

## 2020-01-22 LAB — CBC
HCT: 43.8 % (ref 36.0–46.0)
Hemoglobin: 13.7 g/dL (ref 12.0–15.0)
MCH: 28.8 pg (ref 26.0–34.0)
MCHC: 31.3 g/dL (ref 30.0–36.0)
MCV: 92 fL (ref 80.0–100.0)
Platelets: 377 10*3/uL (ref 150–400)
RBC: 4.76 MIL/uL (ref 3.87–5.11)
RDW: 13.1 % (ref 11.5–15.5)
WBC: 4.4 10*3/uL (ref 4.0–10.5)
nRBC: 0 % (ref 0.0–0.2)

## 2020-01-22 LAB — WET PREP, GENITAL
Clue Cells Wet Prep HPF POC: NONE SEEN
Sperm: NONE SEEN
Yeast Wet Prep HPF POC: NONE SEEN

## 2020-01-22 LAB — POCT PREGNANCY, URINE: Preg Test, Ur: NEGATIVE

## 2020-01-22 LAB — D-DIMER, QUANTITATIVE: D-Dimer, Quant: 0.44 ug/mL-FEU (ref 0.00–0.50)

## 2020-01-22 LAB — LIPASE, BLOOD: Lipase: 26 U/L (ref 11–51)

## 2020-01-22 LAB — HIV ANTIBODY (ROUTINE TESTING W REFLEX): HIV Screen 4th Generation wRfx: NONREACTIVE

## 2020-01-22 LAB — I-STAT BETA HCG BLOOD, ED (MC, WL, AP ONLY): I-stat hCG, quantitative: <5 m[IU]/mL (ref <5)

## 2020-01-22 MED ORDER — METRONIDAZOLE 500 MG PO TABS
2000.0000 mg | ORAL_TABLET | Freq: Once | ORAL | Status: AC
  Administered 2020-01-22: 2000 mg via ORAL
  Filled 2020-01-22: qty 4

## 2020-01-22 NOTE — MAU Provider Note (Signed)
First Provider Initiated Contact with Patient 01/22/20 1111      S [name] is a 39 y.o. G69P1011 non-pregnant female who presents to [facility]U today with complaint of vaginal bleeding, abdominal pain, and cough. Reports bleeding x 3 weeks, had 1 week off, then bleeding again for the last 3 weeks. Denies history of abnormal bleeding. Has nexplanon in place. Did not take a pregnancy test at home. Also reports generalized abdominal pain. Has had a cough since the weekend. States she is coughing up "just blood". States she has been vomiting as well. Denies fever/chills, SOB, or chest pain.    O BP (!) 149/92   Pulse 92   Temp 99.1 F (37.3 C) (Oral)   Resp 18   Ht 5\' 4"  (1.626 m)   Wt 100.3 kg   SpO2 100%   BMI 37.95 kg/m  Physical Exam  Nursing note and vitals reviewed. Constitutional: She appears well-developed and well-nourished. No distress.  Cardiovascular: Normal rate and regular rhythm.  Respiratory: Effort normal and breath sounds normal. No respiratory distress. She has no wheezes.  Skin: She is not diaphoretic.  Psychiatric: She has a normal mood and affect. Her behavior is normal. Thought content normal.    A Non pregnant female Medical screening exam complete  P [facility] Dr. [facility] (EDP) made aware of patient  Anitra Lauth, NP 01/22/2020 11:24 AM

## 2020-01-22 NOTE — ED Notes (Signed)
Patient transported to x-ray. ?

## 2020-01-22 NOTE — MAU Note (Signed)
Patient presents to MAU for vaginal bleeding and r/o pregnancy. Patient has not had a positive pregnancy test at home. Patient also reports having a cough and coughing up bloody sputum.

## 2020-01-22 NOTE — ED Notes (Signed)
Pt verbalizes understanding of d/c instructions. Pt ambulatory at d/c with all belongings.   

## 2020-01-22 NOTE — Discharge Instructions (Signed)
Your pregnancy test was negative today. As discussed you have a Nexplanon in your arm. Please follow up with [facility] to discuss removal if you so wish.   You test positive for trichomonas today which is a sexually transmitted disease. We have treated you in the ED. Please let all partners know you have been treated. They will need to be treated as well. If they are not treated appropriately you can continue to pass the infection back and forth. Please refrain from intercourse for the next 10 days awaiting your other STD test results. We will call you if you test positive. You may return to the ED for treatment if you test positive for gonorrhea, chlamydia, or syphilis.   Return to the ED for any worsening symptoms including worsening bleeding, dizziness/lightheadedness, chest pain, shortness of breath, or if you pass out.
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
## 2020-01-22 NOTE — ED Triage Notes (Signed)
Pt sent from [facility] with reports of vaginal bleeding and throwing up blood since this weekend. Endorses mid abd pain.

## 2020-01-23 LAB — GC/CHLAMYDIA PROBE AMP (~~LOC~~) NOT AT ARMC
Chlamydia: NEGATIVE
Neisseria Gonorrhea: NEGATIVE

## 2020-01-23 LAB — RPR: RPR Ser Ql: NONREACTIVE

## 2020-02-07 ENCOUNTER — Ambulatory Visit: Payer: Medicare HMO

## 2020-02-20 ENCOUNTER — Ambulatory Visit: Payer: Medicare HMO | Admitting: Family Medicine

## 2020-03-03 NOTE — Progress Notes (Deleted)
   Subjective:   Patient ID: Allison Moore    DOB: 17-Dec-1980, 39 y.o. female   MRN: [id number]  Allison Moore is a 39 y.o. female with a history of allergic rhinitis, multiple sclerosis, bipolar disorder/anxiety/schizoaffectie disorder, cannabis abuse, HLD, tobacco abuse, obesity, stasis dermatitis of lower extremities here for leg pain and contraception management.  Pain in legs:    Contraception Management: Patient here today to discuss nexplanon removal. Appears patient has requested to have this removed before but due to difficulties palpating the nexplanon this has been unable to be achieved. She was referred for ultrasound however this has also not been completed. She has been counseled extensively about risks of getting pregnant at her age and comorbidities. She continues to insist on getting nexplanon removed. She has been referred to [facility] several times as well which she has failed to follow up.   Review of Systems:  Per HPI.   Objective:   There were no vitals taken for this visit. Vitals and nursing note reviewed.  General: well nourished, well developed, in no acute distress with non-toxic appearance HEENT: normocephalic, atraumatic, moist mucous membranes Neck: supple, non-tender without lymphadenopathy CV: regular rate and rhythm without murmurs, rubs, or gallops, no lower extremity edema Lungs: clear to auscultation bilaterally with normal work of breathing Abdomen: soft, non-tender, non-distended, no masses or organomegaly palpable, normoactive bowel sounds Skin: warm, dry, no rashes or lesions Extremities: warm and well perfused, normal tone MSK: ROM grossly intact, strength intact, gait normal Neuro: Alert and oriented, speech normal  Assessment & Plan:   No problem-specific Assessment & Plan notes found for this encounter.  No orders of the defined types were placed in this encounter.  No orders of the defined types were placed in this  encounter.   Orpah Cobb, DO PGY-2, [facility] 03/03/2020 1:55 PM

## 2020-03-04 ENCOUNTER — Other Ambulatory Visit: Payer: Self-pay

## 2020-03-04 ENCOUNTER — Ambulatory Visit (INDEPENDENT_AMBULATORY_CARE_PROVIDER_SITE_OTHER): Payer: Medicare HMO | Admitting: Family Medicine

## 2020-03-04 VITALS — BP 140/80 | HR 88 | Ht 64.0 in | Wt 219.2 lb

## 2020-03-04 DIAGNOSIS — R1032 Left lower quadrant pain: Secondary | ICD-10-CM | POA: Diagnosis not present

## 2020-03-04 MED ORDER — MELOXICAM 15 MG PO TABS: 15.0000 mg | ORAL_TABLET | Freq: Every day | ORAL | 0 refills | Status: AC

## 2020-03-04 NOTE — Assessment & Plan Note (Signed)
Referring for the lumbar and pelvic X-ray. Starting Meloxicam for pain. Providing with hip flexion strength exercises. If no improvement will refer to sports medicine.

## 2020-03-04 NOTE — Progress Notes (Addendum)
SUBJECTIVE:   CHIEF COMPLAINT / HPI:   Patient ID: Allison Moore    DOB: 07-24-1981, 39 y.o. female   MRN: [id number]  Allison Moore is a 39 y.o. female with a history of allergic rhinitis, multiple sclerosis, bipolar disorder/anxiety/schizoaffective disorder, cannabis abuse, HLD, tobacco abuse, obesity, stasis dermatitis of lower extremities here for leg pain and contraception management.   Pain in legs: She endorses a 3-4 day history of intermittent shocking pain in her left groin about half way between ASIS and pubic bone. The pain feels like it is shooting from her upper thigh into her hip. The pain does not radiate, though she does endorse some constant tingling in her legs bilaterally that has been present for several years. She notices the pain more when she is walking. Has not noticed anything making it better and has not tried anything for her pain. She has no skin paresthesias or skin changes, spacticity of her legs, numbness or tingling around the painful area, fevers, recent illness, dysuria or changes in bowel movement. No trauma to the area, though she does endorse having a slipped capital femoral epiphysis treated with surgery when she was young.  Review of Systems:  Per HPI.   PERTINENT  PMH / PSH: MS, Schizoaffective disorder, Bipolar, contraception management, cannabis use, HLD, stasis dermatitis of lower extremities.  OBJECTIVE:   BP 140/80   Pulse 88   Ht 5\' 4"  (1.626 m)   Wt 219 lb 3.2 oz (99.4 kg)   SpO2 100%   BMI 37.63 kg/m   Physical Exam  Constitutional: She is well-developed, well-nourished, and in no distress.  Eyes: Conjunctivae and EOM are normal.  Pinpoint pupils  Cardiovascular: Regular rhythm and normal heart sounds.  Pulmonary/Chest: Effort normal. She has wheezes.  Musculoskeletal:        General: Tenderness present. Normal range of motion.     Comments: Tenderness to deep palpation halfway between left sided ASIS and pubic bone. She  additionally had increased pain with internal rotation of the L hip .  Neurological: She is alert. She has normal strength and intact cranial nerves. She is not agitated. No cranial nerve deficit. She exhibits normal muscle tone. Gait normal.  Patellar reflex normal    ASSESSMENT/PLAN:   Groin pain, left Referring for the lumbar and pelvic X-ray. Starting Meloxicam for pain. Providing with hip flexion strength exercises. If no improvement will refer to sports medicine.     , Medical Student [facility]   Attestation: I was personally present and performed or re-performed the history, physical exam, and medical decision making activities of this service and have verified that the service and findings are accurately documented in the student's note.   The following is my additional documentation.  Physical Exam: Gen: Pleasant middle-aged woman, sitting comfortably in exam chair, well developed, no acute distress Resp: Breathing comfortably on room air, speaking in full sentences MSK:   Left Hip:    - Inspection: No gross deformity, no swelling, erythema, or ecchymosis   - Palpation: Tender to palpation midway between ASIS and groin along the inguinal ligament on the left, no tenderness over greater trochanter    - ROM: Normal range of motion on Flexion, extension, abduction, internal and external rotation, pain with internal rotation   - Strength: Normal strength.   - Neuro/vasc: NV intact distally   - Special Tests: Negative FABER, Positive FADIR.  Negative Scour. Negative Ober's. Some tenderness noted when left leg  extended while in lateral recumbent position.  Right Hip:    - Inspection: No gross deformity, no swelling, erythema, or ecchymosis   - Palpation: No TTP, specifically none over greater trochanter   - ROM: Normal range of motion on Flexion, extension, abduction, internal and external rotation   - Strength: Normal strength.   -  Neuro/vasc: NV intact distally   - Special Tests: Negative FABER and FADIR.    Bilateral Knee: exam unremarkable bilaterally, normal strength and sensation, nontender to palpation     Lumbar spine: nontender to palpation, normal range o.f motion Skin: warm and dry, no rashes appreciated    Assessment/Plan Allison Moore is a 40 y.o. female presenting with left groin pain x 3-4 days that occurs with walking. No history of trauma.  Exam notable for pain to palpation in left groin and reproducible pain with internal rotation.  Knee exam and back exam unremarkable. Differential includes flexor muscle strain vs possible arthritis. Patient does have a history of multiple sclerosis that has been lost to follow-up.  However symptoms are inconsistent for multiple sclerosis flare at this time. No red flag symptoms which is reassuring.  No neuropathic symptoms.  Patient is obese which is likely contributing to her symptoms. Will obtain imaging to further evaluate for bony pathology and provide short course of Meloxicam 15mg  QD x 14 days. Reviewed PMH and no red flags for taking NSAIDs. Flexor muscle exercises provided. Recommend she RTC if no improvement in 4-6 weeks. If persistent at that time will consider referral to sports medicine clinic for further evaluation.  - obtain lumbar and left hip x-ray, will follow up results - trial Meloxicam 15mg  QD x 14 d - home flexor muscle exercises  - recommended patient try OTC topical analgesics PRN, heat/ice PRN - RTC if worsening or no improvement in 4 to 6 weeks  [employer], DO 03/06/2020 9:04 AM

## 2020-03-04 NOTE — Patient Instructions (Signed)
Thank you for coming to see me today. It was a pleasure to see you.   For your pain in your left groin we will obtain some imaging to further evaluate.  You will need to go over to the [facility] to get these x-rays.  I will call you with the results.  In the meantime you can treat the discomfort with meloxicam once a day.  If you develop abdominal discomfort, please discontinue the medication and call me to further discuss.  I have also given you a handout for some stretches you can do to help with this pain.  Please be sure to follow-up if no improvement in this pain in 2 to 3 weeks, or if worsening.  If you have any questions or concerns, please do not hesitate to call the office at [phone].  Take Care,   Dr. Orpah Cobb, DO Resident Physician [facility] [phone]

## 2020-03-19 ENCOUNTER — Ambulatory Visit: Payer: Medicare HMO | Admitting: Family Medicine

## 2020-05-05 ENCOUNTER — Encounter: Payer: Self-pay | Admitting: Obstetrics

## 2020-05-05 ENCOUNTER — Other Ambulatory Visit (HOSPITAL_COMMUNITY)
Admission: RE | Admit: 2020-05-05 | Discharge: 2020-05-05 | Disposition: A | Discharge status: Home / Self Care | Payer: Medicare HMO | Source: Ambulatory Visit | Attending: Obstetrics | Admitting: Obstetrics

## 2020-05-05 ENCOUNTER — Ambulatory Visit (INDEPENDENT_AMBULATORY_CARE_PROVIDER_SITE_OTHER): Payer: Medicare HMO | Admitting: Obstetrics

## 2020-05-05 ENCOUNTER — Other Ambulatory Visit: Payer: Self-pay

## 2020-05-05 VITALS — BP 131/80 | HR 77 | Ht 64.0 in | Wt 222.6 lb

## 2020-05-05 DIAGNOSIS — N939 Abnormal uterine and vaginal bleeding, unspecified: Secondary | ICD-10-CM | POA: Diagnosis not present

## 2020-05-05 DIAGNOSIS — Z01419 Encounter for gynecological examination (general) (routine) without abnormal findings: Secondary | ICD-10-CM

## 2020-05-05 DIAGNOSIS — N898 Other specified noninflammatory disorders of vagina: Secondary | ICD-10-CM

## 2020-05-05 DIAGNOSIS — Z1239 Encounter for other screening for malignant neoplasm of breast: Secondary | ICD-10-CM

## 2020-05-05 DIAGNOSIS — Z87891 Personal history of nicotine dependence: Secondary | ICD-10-CM

## 2020-05-05 DIAGNOSIS — Z1151 Encounter for screening for human papillomavirus (HPV): Secondary | ICD-10-CM | POA: Diagnosis not present

## 2020-05-05 DIAGNOSIS — Z3046 Encounter for surveillance of implantable subdermal contraceptive: Secondary | ICD-10-CM

## 2020-05-05 NOTE — Progress Notes (Signed)
New Patient in the office to establish care. Last pap 02-20-18.  GAD-7=0  Pt has nexplanon states it was [facility] about 3 years ago in a [facility] in [place].  Pt states she has had moderate bleeding for the last 4 weeks.
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
## 2020-05-06 ENCOUNTER — Telehealth: Payer: Self-pay

## 2020-05-06 ENCOUNTER — Other Ambulatory Visit: Payer: Self-pay | Admitting: Obstetrics

## 2020-05-06 DIAGNOSIS — N760 Acute vaginitis: Secondary | ICD-10-CM

## 2020-05-06 LAB — CERVICOVAGINAL ANCILLARY ONLY
Bacterial Vaginitis (gardnerella): POSITIVE — AB
Candida Glabrata: NEGATIVE
Candida Vaginitis: NEGATIVE
Chlamydia: NEGATIVE
Comment: NEGATIVE
Comment: NEGATIVE
Comment: NEGATIVE
Comment: NEGATIVE
Comment: NEGATIVE
Comment: NORMAL
Neisseria Gonorrhea: NEGATIVE
Trichomonas: NEGATIVE

## 2020-05-06 MED ORDER — METRONIDAZOLE 500 MG PO TABS: 500.0000 mg | ORAL_TABLET | Freq: Two times a day (BID) | ORAL | 2 refills | Status: DC

## 2020-05-06 NOTE — Telephone Encounter (Signed)
TC to pt to make aware of results no answer getting Busy signal.

## 2020-05-07 LAB — CYTOLOGY - PAP
Adequacy: ABSENT
Comment: NEGATIVE
Diagnosis: NEGATIVE
High risk HPV: NEGATIVE

## 2020-05-09 ENCOUNTER — Other Ambulatory Visit: Payer: Self-pay

## 2020-05-09 ENCOUNTER — Ambulatory Visit (INDEPENDENT_AMBULATORY_CARE_PROVIDER_SITE_OTHER): Payer: Medicare HMO | Admitting: Family Medicine

## 2020-05-09 ENCOUNTER — Encounter: Payer: Self-pay | Admitting: Family Medicine

## 2020-05-09 VITALS — BP 104/60 | HR 88 | Wt 229.0 lb

## 2020-05-09 DIAGNOSIS — Z3200 Encounter for pregnancy test, result unknown: Secondary | ICD-10-CM

## 2020-05-09 DIAGNOSIS — Z3202 Encounter for pregnancy test, result negative: Secondary | ICD-10-CM

## 2020-05-09 DIAGNOSIS — Z3046 Encounter for surveillance of implantable subdermal contraceptive: Secondary | ICD-10-CM

## 2020-05-09 LAB — POCT URINE PREGNANCY: Preg Test, Ur: NEGATIVE

## 2020-05-09 NOTE — Progress Notes (Signed)
° ° °  SUBJECTIVE:   CHIEF COMPLAINT / HPI:   Possible Pregnancy Patient presents requesting pregnancy test. Patient states she is sexually active with her boyfriend and believes she is pregnant. Feels as if there is "something in my belly". Denies abdominal pain, nausea, vomiting, diarrhea, or constipation. LMP 1 week ago. Of note, patient reports she has a Nexplanon in her L arm, placed 3 years ago at [facility].  Patient has been seen in our clinic as well as the ED multiple times with this complaint. She desires pregnancy at this time and would like the Nexplanon removed, however it has not been palpable on prior exams. OBGYN saw her 4 days ago and suggested she see general surgery for removal.   PERTINENT  PMH / PSH: Multiple sclerosis, schizoaffective disorder, bipolar disorder  Patient states she does not take any medications.  OBJECTIVE:   BP 104/60    Pulse 88    Wt 229 lb (103.9 kg)    SpO2 99%    BMI 39.31 kg/m   General: Alert, no acute distress Head: Atraumatic Cardiac: RRR, normal S1/S2, no murmurs, rubs or gallops Lungs: Normal work of breathing, breath sounds equal bilaterally, lungs CTA without wheezes or rales Abdomen: Normoactive bowel sounds. Soft, nontender, no masses or hepatosplenomegaly Psych: Normal speech, appropriate affect, no hallucinations, possible delusions Extremities: Unable to palpate Nexplanon rod in L or R upper extremity  ASSESSMENT/PLAN:   Encounter for pregnancy test, result negative Pregnancy test negative today. Discussed with patient that she is unlikely to get pregnant while she has a Nexplanon in place. Do not suspect other abdominal pathology given lack of abdominal pain, nausea, vomiting, diarrhea, constipation, or other symptoms.  Plan: -Will obtain x-ray to confirm Nexplanon position -Pending x-ray results, may refer to general surgery for removal  Follow up in 4-6 weeks to address other medical and psychiatric  concerns.  Discussed case with Dr. Jennette Kettle.    Maury Dus, MD [facility]

## 2020-05-09 NOTE — Assessment & Plan Note (Addendum)
Pregnancy test negative today. Discussed with patient that she is unlikely to get pregnant while she has a Nexplanon in place. Do not suspect other abdominal pathology given lack of abdominal pain, nausea, vomiting, diarrhea, constipation, or other symptoms.  Plan: -Will obtain x-ray to confirm Nexplanon position -Pending x-ray results, may refer to general surgery for removal

## 2020-05-09 NOTE — Patient Instructions (Signed)
It was great to meet you!  Things we discussed: - Your pregnancy test was negative today.  - I have ordered an x-ray of your left arm to locate your Nexplanon. Please go to [facility] : [facility] ([address], [address]) at your earliest convenience.  - I will call you with the results, and can refer you to general surgery for removal if appropriate.   Take care,  Dr. [facility]

## 2020-06-18 ENCOUNTER — Ambulatory Visit: Payer: Medicare HMO | Admitting: Family Medicine

## 2020-06-20 ENCOUNTER — Other Ambulatory Visit: Payer: Self-pay

## 2020-06-20 ENCOUNTER — Emergency Department (HOSPITAL_COMMUNITY): Payer: Medicare HMO

## 2020-06-20 ENCOUNTER — Encounter (HOSPITAL_COMMUNITY): Payer: Self-pay | Admitting: *Deleted

## 2020-06-20 ENCOUNTER — Emergency Department (HOSPITAL_COMMUNITY)
Admission: EM | Admit: 2020-06-20 | Discharge: 2020-06-20 | Disposition: A | Discharge status: Home / Self Care | Payer: Medicare HMO | Attending: Emergency Medicine | Admitting: Emergency Medicine

## 2020-06-20 DIAGNOSIS — R52 Pain, unspecified: Secondary | ICD-10-CM | POA: Diagnosis not present

## 2020-06-20 DIAGNOSIS — R079 Chest pain, unspecified: Secondary | ICD-10-CM | POA: Diagnosis not present

## 2020-06-20 DIAGNOSIS — O039 Complete or unspecified spontaneous abortion without complication: Secondary | ICD-10-CM | POA: Diagnosis not present

## 2020-06-20 DIAGNOSIS — S3991XA Unspecified injury of abdomen, initial encounter: Secondary | ICD-10-CM | POA: Diagnosis not present

## 2020-06-20 DIAGNOSIS — S3993XA Unspecified injury of pelvis, initial encounter: Secondary | ICD-10-CM | POA: Diagnosis not present

## 2020-06-20 DIAGNOSIS — R0789 Other chest pain: Secondary | ICD-10-CM | POA: Diagnosis not present

## 2020-06-20 DIAGNOSIS — Y939 Activity, unspecified: Secondary | ICD-10-CM | POA: Insufficient documentation

## 2020-06-20 DIAGNOSIS — S99922A Unspecified injury of left foot, initial encounter: Secondary | ICD-10-CM | POA: Diagnosis not present

## 2020-06-20 DIAGNOSIS — R109 Unspecified abdominal pain: Secondary | ICD-10-CM | POA: Insufficient documentation

## 2020-06-20 DIAGNOSIS — Y999 Unspecified external cause status: Secondary | ICD-10-CM | POA: Insufficient documentation

## 2020-06-20 DIAGNOSIS — Z79899 Other long term (current) drug therapy: Secondary | ICD-10-CM | POA: Diagnosis not present

## 2020-06-20 DIAGNOSIS — N939 Abnormal uterine and vaginal bleeding, unspecified: Secondary | ICD-10-CM | POA: Diagnosis not present

## 2020-06-20 DIAGNOSIS — M549 Dorsalgia, unspecified: Secondary | ICD-10-CM | POA: Diagnosis not present

## 2020-06-20 DIAGNOSIS — Y92481 Parking lot as the place of occurrence of the external cause: Secondary | ICD-10-CM | POA: Diagnosis not present

## 2020-06-20 DIAGNOSIS — S59912A Unspecified injury of left forearm, initial encounter: Secondary | ICD-10-CM | POA: Diagnosis not present

## 2020-06-20 DIAGNOSIS — S99912A Unspecified injury of left ankle, initial encounter: Secondary | ICD-10-CM | POA: Diagnosis not present

## 2020-06-20 DIAGNOSIS — S4992XA Unspecified injury of left shoulder and upper arm, initial encounter: Secondary | ICD-10-CM | POA: Diagnosis not present

## 2020-06-20 DIAGNOSIS — F17210 Nicotine dependence, cigarettes, uncomplicated: Secondary | ICD-10-CM | POA: Diagnosis not present

## 2020-06-20 DIAGNOSIS — R58 Hemorrhage, not elsewhere classified: Secondary | ICD-10-CM | POA: Diagnosis not present

## 2020-06-20 DIAGNOSIS — F259 Schizoaffective disorder, unspecified: Secondary | ICD-10-CM | POA: Diagnosis not present

## 2020-06-20 DIAGNOSIS — M25572 Pain in left ankle and joints of left foot: Secondary | ICD-10-CM | POA: Diagnosis not present

## 2020-06-20 DIAGNOSIS — S299XXA Unspecified injury of thorax, initial encounter: Secondary | ICD-10-CM | POA: Diagnosis not present

## 2020-06-20 LAB — COMPREHENSIVE METABOLIC PANEL
ALT: 19 U/L (ref 0–44)
AST: 16 U/L (ref 15–41)
Albumin: 4.1 g/dL (ref 3.5–5.0)
Alkaline Phosphatase: 51 U/L (ref 38–126)
Anion gap: 10 (ref 5–15)
BUN: 12 mg/dL (ref 6–20)
CO2: 25 mmol/L (ref 22–32)
Calcium: 8.8 mg/dL — ABNORMAL LOW (ref 8.9–10.3)
Chloride: 104 mmol/L (ref 98–111)
Creatinine, Ser: 0.83 mg/dL (ref 0.44–1.00)
GFR calc Af Amer: >60 mL/min (ref >60)
GFR calc non Af Amer: >60 mL/min (ref >60)
Glucose, Bld: 90 mg/dL (ref 70–99)
Potassium: 3.9 mmol/L (ref 3.5–5.1)
Sodium: 139 mmol/L (ref 135–145)
Total Bilirubin: 0.5 mg/dL (ref 0.3–1.2)
Total Protein: 7.3 g/dL (ref 6.5–8.1)

## 2020-06-20 LAB — CBC
HCT: 36 % (ref 36.0–46.0)
Hemoglobin: 12 g/dL (ref 12.0–15.0)
MCH: 30.3 pg (ref 26.0–34.0)
MCHC: 33.3 g/dL (ref 30.0–36.0)
MCV: 90.9 fL (ref 80.0–100.0)
Platelets: 337 10*3/uL (ref 150–400)
RBC: 3.96 MIL/uL (ref 3.87–5.11)
RDW: 13.2 % (ref 11.5–15.5)
WBC: 6.1 10*3/uL (ref 4.0–10.5)
nRBC: 0 % (ref 0.0–0.2)

## 2020-06-20 LAB — WET PREP, GENITAL
Clue Cells Wet Prep HPF POC: NONE SEEN
Sperm: NONE SEEN
Trich, Wet Prep: NONE SEEN
WBC, Wet Prep HPF POC: NONE SEEN
Yeast Wet Prep HPF POC: NONE SEEN

## 2020-06-20 LAB — I-STAT BETA HCG BLOOD, ED (MC, WL, AP ONLY): I-stat hCG, quantitative: <5 m[IU]/mL (ref <5)

## 2020-06-20 MED ORDER — FENTANYL CITRATE (PF) 100 MCG/2ML IJ SOLN
50.0000 ug | Freq: Once | INTRAMUSCULAR | Status: AC
  Administered 2020-06-20: 50 ug via INTRAVENOUS
  Filled 2020-06-20: qty 2

## 2020-06-20 MED ORDER — IOHEXOL 300 MG/ML  SOLN
100.0000 mL | Freq: Once | INTRAMUSCULAR | Status: AC | PRN
  Administered 2020-06-20: 100 mL via INTRAVENOUS

## 2020-06-20 MED ORDER — METHOCARBAMOL 500 MG PO TABS: 500.0000 mg | ORAL_TABLET | Freq: Three times a day (TID) | ORAL | 0 refills | Status: DC | PRN

## 2020-06-20 MED ORDER — SODIUM CHLORIDE (PF) 0.9 % IJ SOLN
INTRAMUSCULAR | Status: AC
  Filled 2020-06-20: qty 50

## 2020-06-20 MED ORDER — NAPROXEN 500 MG PO TABS: 500.0000 mg | ORAL_TABLET | Freq: Two times a day (BID) | ORAL | 0 refills | Status: DC | PRN
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
## 2020-06-23 LAB — GC/CHLAMYDIA PROBE AMP (~~LOC~~) NOT AT ARMC
Chlamydia: NEGATIVE
Comment: NEGATIVE
Comment: NORMAL
Neisseria Gonorrhea: NEGATIVE

## 2020-08-09 ENCOUNTER — Encounter (HOSPITAL_COMMUNITY): Payer: Self-pay | Admitting: *Deleted

## 2020-08-09 ENCOUNTER — Ambulatory Visit (HOSPITAL_COMMUNITY)
Admission: EM | Admit: 2020-08-09 | Discharge: 2020-08-09 | Disposition: A | Discharge status: Home / Self Care | Payer: Medicare HMO

## 2020-08-09 ENCOUNTER — Other Ambulatory Visit: Payer: Self-pay

## 2020-08-09 DIAGNOSIS — M25511 Pain in right shoulder: Secondary | ICD-10-CM

## 2020-08-09 DIAGNOSIS — Z3200 Encounter for pregnancy test, result unknown: Secondary | ICD-10-CM
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
## 2020-08-09 NOTE — ED Notes (Signed)
Patient left from treatment room without giving a reason .

## 2020-08-09 NOTE — ED Notes (Signed)
Pt called in lobby xs 1, no answer
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
## 2020-08-13 ENCOUNTER — Ambulatory Visit: Payer: Medicare HMO

## 2020-08-20 NOTE — Progress Notes (Deleted)
    SUBJECTIVE:   CHIEF COMPLAINT / HPI:   Right Shoulder Pain ***  Pregnancy***  PERTINENT  PMH / PSH: ***  OBJECTIVE:   There were no vitals taken for this visit.  ***  ASSESSMENT/PLAN:   No problem-specific Assessment & Plan notes found for this encounter.     Unknown Jim, DO [facility]

## 2020-08-21 ENCOUNTER — Ambulatory Visit: Payer: Medicare HMO | Admitting: Family Medicine
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
## 2020-09-05 ENCOUNTER — Other Ambulatory Visit: Payer: Self-pay

## 2020-09-05 ENCOUNTER — Ambulatory Visit (INDEPENDENT_AMBULATORY_CARE_PROVIDER_SITE_OTHER): Payer: Medicare HMO | Admitting: Family Medicine

## 2020-09-05 VITALS — BP 122/78 | HR 80 | Ht 64.0 in | Wt 210.4 lb

## 2020-09-05 DIAGNOSIS — N912 Amenorrhea, unspecified: Secondary | ICD-10-CM | POA: Diagnosis not present

## 2020-09-05 DIAGNOSIS — M79601 Pain in right arm: Secondary | ICD-10-CM | POA: Insufficient documentation

## 2020-09-05 DIAGNOSIS — N926 Irregular menstruation, unspecified: Secondary | ICD-10-CM
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
## 2020-09-06 LAB — BETA HCG QUANT (REF LAB): hCG Quant: <1 m[IU]/mL

## 2020-09-07 ENCOUNTER — Other Ambulatory Visit: Payer: Self-pay | Admitting: Family Medicine

## 2020-09-07 DIAGNOSIS — M79601 Pain in right arm: Secondary | ICD-10-CM

## 2020-09-08 ENCOUNTER — Telehealth: Payer: Self-pay

## 2020-09-08 NOTE — Telephone Encounter (Signed)
Patient calls nurse line requesting results from OV on Friday, 11/12. Informed patient of negative pregnancy test. Patient has additional concerns before proceeding with X-ray. Patient states that she has been vomiting 4-5 times/ week in the morning and has been feeling "stomach vibrations".   Patient is requesting returned call from provider to discuss in more detail.   Veronda Prude, RN

## 2020-09-09 NOTE — Telephone Encounter (Signed)
The focus of our exam was to discuss her possible pregnancy and her arm pain.  We only briefly discussed abdominal pain/vomiting in the context of ruling out a pregnancy related cause such as ectopic pregnancy.  If she would like her vomiting addressed more formally she will need to schedule an appointment to be seen in our clinic.  I can attempt to call her today if I have time, but I am not going to be able to offer her anything other than advice to come be seen in the clinic.

## 2020-09-09 NOTE — Telephone Encounter (Signed)
Returned call to patient, went straight to VM. Left HIPAA compliant VM to return call to office to discuss scheduling appointment.   Veronda Prude, RN

## 2020-09-11 ENCOUNTER — Encounter: Payer: Self-pay | Admitting: Family Medicine

## 2020-09-11 ENCOUNTER — Ambulatory Visit (INDEPENDENT_AMBULATORY_CARE_PROVIDER_SITE_OTHER): Payer: Medicare HMO | Admitting: Family Medicine

## 2020-09-11 ENCOUNTER — Other Ambulatory Visit: Payer: Self-pay

## 2020-09-11 ENCOUNTER — Encounter (HOSPITAL_COMMUNITY): Payer: Self-pay | Admitting: Emergency Medicine

## 2020-09-11 ENCOUNTER — Emergency Department (HOSPITAL_COMMUNITY)
Admission: EM | Admit: 2020-09-11 | Discharge: 2020-09-12 | Disposition: A | Discharge status: Home / Self Care | Payer: Medicare HMO | Attending: Emergency Medicine | Admitting: Emergency Medicine

## 2020-09-11 VITALS — BP 128/82 | HR 83 | Ht 64.0 in | Wt 219.0 lb

## 2020-09-11 DIAGNOSIS — R1084 Generalized abdominal pain: Secondary | ICD-10-CM

## 2020-09-11 DIAGNOSIS — Z791 Long term (current) use of non-steroidal anti-inflammatories (NSAID): Secondary | ICD-10-CM | POA: Diagnosis not present

## 2020-09-11 DIAGNOSIS — R109 Unspecified abdominal pain: Secondary | ICD-10-CM | POA: Diagnosis not present

## 2020-09-11 DIAGNOSIS — R1013 Epigastric pain: Secondary | ICD-10-CM | POA: Diagnosis not present

## 2020-09-11 DIAGNOSIS — Z87891 Personal history of nicotine dependence: Secondary | ICD-10-CM | POA: Insufficient documentation

## 2020-09-11 LAB — URINALYSIS, ROUTINE W REFLEX MICROSCOPIC
Bilirubin Urine: NEGATIVE
Glucose, UA: NEGATIVE mg/dL
Ketones, ur: NEGATIVE mg/dL
Leukocytes,Ua: NEGATIVE
Nitrite: NEGATIVE
Protein, ur: NEGATIVE mg/dL
RBC / HPF: >50 RBC/hpf — ABNORMAL HIGH (ref 0–5)
Specific Gravity, Urine: 1.025 (ref 1.005–1.030)
pH: 5 (ref 5.0–8.0)

## 2020-09-11 LAB — COMPREHENSIVE METABOLIC PANEL
ALT: 16 U/L (ref 0–44)
AST: 16 U/L (ref 15–41)
Albumin: 3.7 g/dL (ref 3.5–5.0)
Alkaline Phosphatase: 56 U/L (ref 38–126)
Anion gap: 6 (ref 5–15)
BUN: 14 mg/dL (ref 6–20)
CO2: 26 mmol/L (ref 22–32)
Calcium: 9.3 mg/dL (ref 8.9–10.3)
Chloride: 105 mmol/L (ref 98–111)
Creatinine, Ser: 0.72 mg/dL (ref 0.44–1.00)
GFR, Estimated: >60 mL/min (ref >60)
Glucose, Bld: 88 mg/dL (ref 70–99)
Potassium: 3.9 mmol/L (ref 3.5–5.1)
Sodium: 137 mmol/L (ref 135–145)
Total Bilirubin: 0.2 mg/dL — ABNORMAL LOW (ref 0.3–1.2)
Total Protein: 6.7 g/dL (ref 6.5–8.1)

## 2020-09-11 LAB — CBC
HCT: 39.3 % (ref 36.0–46.0)
Hemoglobin: 12.3 g/dL (ref 12.0–15.0)
MCH: 29.4 pg (ref 26.0–34.0)
MCHC: 31.3 g/dL (ref 30.0–36.0)
MCV: 93.8 fL (ref 80.0–100.0)
Platelets: 402 10*3/uL — ABNORMAL HIGH (ref 150–400)
RBC: 4.19 MIL/uL (ref 3.87–5.11)
RDW: 13 % (ref 11.5–15.5)
WBC: 5.8 10*3/uL (ref 4.0–10.5)
nRBC: 0 % (ref 0.0–0.2)

## 2020-09-11 LAB — I-STAT BETA HCG BLOOD, ED (MC, WL, AP ONLY): I-stat hCG, quantitative: <5 m[IU]/mL (ref <5)

## 2020-09-11 LAB — POCT URINE PREGNANCY: Preg Test, Ur: NEGATIVE

## 2020-09-11 LAB — LIPASE, BLOOD: Lipase: 36 U/L (ref 11–51)

## 2020-09-11 MED ORDER — LIDOCAINE VISCOUS HCL 2 % MT SOLN
15.0000 mL | Freq: Once | OROMUCOSAL | Status: DC
  Filled 2020-09-11: qty 15

## 2020-09-11 MED ORDER — ALUM & MAG HYDROXIDE-SIMETH 200-200-20 MG/5ML PO SUSP
30.0000 mL | Freq: Once | ORAL | Status: DC
  Filled 2020-09-11: qty 30

## 2020-09-11 NOTE — Patient Instructions (Signed)
It was nice to see you again today,  I will get a test to help look for the cause of your abdominal pain.  If this is negative we can discuss a referral to a gastroenterologist for further work-up.  Once again, your pregnancy test was negative.  For your complaint of warts, I would make an appointment with your PCP, Dr. Anner Crete, to have this examined.  Have a great day,  Frederic Jericho, MD

## 2020-09-11 NOTE — Progress Notes (Signed)
    SUBJECTIVE:   CHIEF COMPLAINT / HPI:   abd pain: Patient doesn't believe her pregnancy tests are negative.  pt states feels like a constant vibration in her stomach for the past month.  Not better/worse with food or position.  sensation is Constant all day. No vomiting but states she was  "spitting up mucous".   Doesn't remember the doctors telling her to take tums on a previous visit. .  No blood with bowel movements.  no diarrhea.    As I was finishing the encounter, pt states she has vaginal warts.  I informed her we do not have time to examine that at this time and pt became upset.  I informed her she should schedule an appointment with her PCP for this issue.   PERTINENT  PMH / PSH: schizzoaffective disorder.   OBJECTIVE:   BP 128/82   Pulse 83   Ht 5\' 4"  (1.626 m)   Wt 219 lb (99.3 kg)   SpO2 100%   BMI 37.59 kg/m   Gen: alert.  Oriented.  No acute distress.   CV: RRR Pulm: LCTAB GI: soft, mildly tender in the LUQ.  No vibrations.  Normal bowel sounds.   Psych: insight is poor. Appears agitated when she is told something disagrees with what she believes is true, such as with her pregnancy tests.   ASSESSMENT/PLAN:   Generalized abdominal pain Pt has chronic complaints of abdominal pain.  She is tender in the LUQ.  Her 'vibrations' that she feels may be gas.  Pt was told to take pepcid and tums previously but does not remember this.  Will get H. Pylori test and treat if necessary. If negative consider refferal to GI for endoscopy if needed.  Advised pt to use antacids such as pepcid or tums.       , MD [facility]

## 2020-09-11 NOTE — Progress Notes (Deleted)
Feels like a constant vibration in her stomach for the past month.  Not better/worse with food or position.  Constant all day.  Spitting up mucous. Not vomiting.  Doesn't remember somone telling her to take tums.  No blood with bowel movements.    Pt states she also has warts on her vagina.

## 2020-09-11 NOTE — ED Triage Notes (Signed)
Pt endorses generalized abd pain for a month with a negative pregnancy test. Denies diarrhea. States she has vaginal warts.

## 2020-09-12 MED ORDER — ONDANSETRON 4 MG PO TBDP
4.0000 mg | ORAL_TABLET | Freq: Three times a day (TID) | ORAL | 0 refills | Status: DC | PRN
Start: 2020-09-12 — End: 2021-06-22

## 2020-09-12 NOTE — Discharge Instructions (Signed)
Thank you for allowing me to care for you today in the Emergency Department.   Your primary care team is getting you a referral to a gastroenterologist.  Please follow closely with their office as they will be helping you get set up for this appointment.  Let 1 tablet of Zofran dissolve in your tongue every 8 hours as needed for nausea or vomiting.  Return to the emergency department if you stop making urine, have high fevers with worsening abdominal pain, if you have black or bloody stool, uncontrollable vomiting despite taking Zofran, or other new, concerning symptoms.
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
## 2020-09-12 NOTE — ED Notes (Signed)
Pt would not allow this writer to get set of VS. Pt upset and yelling b/c the department doesn't provide transportation home. Pt slamming the phone and throwing the blankets. Pt was d/c by this Clinical research associate and taken to the phone in the waiting room to call for her ride.

## 2020-09-14 NOTE — Assessment & Plan Note (Addendum)
Pt has chronic complaints of abdominal pain.  She is tender in the LUQ.  Her 'vibrations' that she feels may be gas.  Pt was told to take pepcid and tums previously but does not remember this.  Will get H. Pylori test and treat if necessary. If negative consider refferal to GI for endoscopy if needed.  Advised pt to use antacids such as pepcid or tums.

## 2020-11-04 ENCOUNTER — Ambulatory Visit: Payer: Medicare HMO | Admitting: Family Medicine

## 2020-11-04 NOTE — Progress Notes (Deleted)
   Subjective:   Patient ID: Allison Moore    DOB: [birth year], 40 y.o. female   MRN: [id number]  Anjoli Diemer is a 40 y.o. female with a history of allergic rhinitis, MS, anxiety/bipolar/depression, cannabis abuse, h/o tobacco abuse, HLD, obesity, schizoaffective disordder here for abnormal uterine bleeding.  Abnormal Uterine Bleeding; Patient endorses menstrual bleeding x 3 weeks LMP ** Contraception: Sexually Active:  Associated Symptoms: Lower abdominal pain: *** Fever: *** Vaginal discharge: *** Dysmenorrhea: ***  Dyspareunia: ***   Infertility: *** Changes in bladder or bowel function: *** Galactorrhea: ***,  Hirsutism: *** Hot flashes: ***   Lactation: ***  Stress: ***  Eating D/O: ***  Intense Exercise: ***  Review of Systems:  Per HPI.   Objective:   There were no vitals taken for this visit. Vitals and nursing note reviewed.  General: pleasant ***, sitting comfortably in exam chair, well nourished, well developed, in no acute distress with non-toxic appearance HEENT: normocephalic, atraumatic, moist mucous membranes, oropharynx clear without erythema or exudate, TM normal bilaterally  Neck: supple, non-tender without lymphadenopathy CV: regular rate and rhythm without murmurs, rubs, or gallops, no lower extremity edema, 2+ radial and pedal pulses bilaterally Lungs: clear to auscultation bilaterally with normal work of breathing on room air Resp: breathing comfortably on room air, speaking in full sentences Abdomen: soft, non-tender, non-distended, no masses or organomegaly palpable, normoactive bowel sounds Skin: warm, dry, no rashes or lesions Extremities: warm and well perfused, normal tone MSK: ROM grossly intact, strength intact, gait normal Neuro: Alert and oriented, speech normal  Assessment & Plan:   No problem-specific Assessment & Plan notes found for this encounter.  No orders of the defined types were placed in this encounter.  No orders  of the defined types were placed in this encounter.   Orpah Cobb, DO PGY-3, [facility] 11/04/2020 10:26 AM

## 2020-11-11 ENCOUNTER — Ambulatory Visit: Payer: Medicare HMO | Admitting: Obstetrics

## 2020-11-25 DIAGNOSIS — Z20822 Contact with and (suspected) exposure to covid-19: Secondary | ICD-10-CM | POA: Diagnosis not present

## 2020-12-04 ENCOUNTER — Ambulatory Visit: Payer: Medicare HMO | Admitting: Family Medicine

## 2020-12-16 ENCOUNTER — Ambulatory Visit: Payer: Medicare HMO | Admitting: Family Medicine

## 2020-12-19 ENCOUNTER — Ambulatory Visit: Payer: Medicare HMO | Admitting: Family Medicine

## 2020-12-19 NOTE — Progress Notes (Deleted)
     SUBJECTIVE:   CHIEF COMPLAINT / HPI:   Allison Moore is a 40 y.o. female presents for pregnancy test   Pregnancy test Pt would like pregnancy test. LMP ***. Sexually active with ***. Pregnancy is *** wanted.  ***  Flowsheet Row Office Visit from 09/11/2020 in [facility]  PHQ-9 Total Score 0         PERTINENT  PMH / PSH:   OBJECTIVE:   There were no vitals taken for this visit.   General: Alert, no acute distress Cardio: Normal S1 and S2, RRR, no r/m/g Pulm: CTAB, normal work of breathing Abdomen: Bowel sounds normal. Abdomen soft and non-tender.  Extremities: No peripheral edema.  Neuro: Cranial nerves grossly intact   ASSESSMENT/PLAN:   No problem-specific Assessment & Plan notes found for this encounter.     Towanda Octave, MD PGY-2 [facility]

## 2021-01-06 NOTE — Progress Notes (Incomplete)
   Subjective:   Patient ID: Allison Moore    DOB: 07/30/1981, 40 y.o. female   MRN: [id number]  Allison Moore is a 40 y.o. female with a history of *** here for ***  Missed Period: Patient presents to confirm prengancy.  Endorses LMP: *** Regular: *** Birth control in last 3 months: *** Home pregnancy test x*** that were positive. This was unplanned but wanted*** Prenatal Vitamin: *** OB history: ***  H/o c-section: *** Pregnancy complications: *** Delivery complications: *** Tobacco: *** Alcohol: *** Illicit drugs: ***  Positive pregnancy test Urine pregnancy positive. LMP ~10/20/20 although this was an irregular period with two menstrual cycles in December. This was unplanned but wanted.  History of 7 pregnancies, 5 live births, 2 miscarriages. All vaginal. History of gestational diabetes in first pregnancy.  Scheduled for dating ultrasound  Initial OB labs obtained today  Start prenatal vitamin  Provided list of medications safe to take during pregnancy  Encouraged to avoid alcohol, tobacco/nicotine, and illicit drugs  Avoid cat litter, seafood, unpasteurized soft cheeses, and deli meats  Patient to make initial OB appointment for~4 weeks   Review of Systems:  Per HPI.   Objective:   There were no vitals taken for this visit. Vitals and nursing note reviewed.  General: pleasant ***, sitting comfortably in exam chair, well nourished, well developed, in no acute distress with non-toxic appearance HEENT: normocephalic, atraumatic, moist mucous membranes, oropharynx clear without erythema or exudate, TM normal bilaterally  Neck: supple, non-tender without lymphadenopathy CV: regular rate and rhythm without murmurs, rubs, or gallops, no lower extremity edema, 2+ radial and pedal pulses bilaterally Lungs: clear to auscultation bilaterally with normal work of breathing on room air Resp: breathing comfortably on room air, speaking in full sentences Abdomen:  soft, non-tender, non-distended, no masses or organomegaly palpable, normoactive bowel sounds Skin: warm, dry, no rashes or lesions Extremities: warm and well perfused, normal tone MSK: ROM grossly intact, strength intact, gait normal Neuro: Alert and oriented, speech normal  Assessment & Plan:   No problem-specific Assessment & Plan notes found for this encounter.  No orders of the defined types were placed in this encounter.  No orders of the defined types were placed in this encounter.   {    This will disappear when note is signed, click to select method of visit    :1}  Orpah Cobb, DO PGY-3, [facility] 01/06/2021 3:32 PM

## 2021-01-07 ENCOUNTER — Ambulatory Visit: Payer: Medicare HMO | Admitting: Family Medicine

## 2021-01-07 ENCOUNTER — Telehealth (INDEPENDENT_AMBULATORY_CARE_PROVIDER_SITE_OTHER): Payer: Medicare HMO | Admitting: Family Medicine

## 2021-01-07 DIAGNOSIS — N926 Irregular menstruation, unspecified: Secondary | ICD-10-CM

## 2021-01-07 LAB — POCT URINE PREGNANCY: Preg Test, Ur: NEGATIVE

## 2021-01-07 NOTE — Telephone Encounter (Signed)
Attempted to contact patient multiple times after missed appointment today. LMoVM for call back. Wanted to inform her that her pregnancy test was negative in our office today. If she feels this is inaccurate, I would recommend follow up with PCP for blood work to confirm this.

## 2021-01-15 ENCOUNTER — Other Ambulatory Visit: Payer: Self-pay

## 2021-01-15 ENCOUNTER — Ambulatory Visit: Payer: Medicare HMO | Admitting: Family Medicine

## 2021-01-15 ENCOUNTER — Ambulatory Visit (INDEPENDENT_AMBULATORY_CARE_PROVIDER_SITE_OTHER): Payer: Medicare HMO | Admitting: Family Medicine

## 2021-01-15 ENCOUNTER — Encounter: Payer: Self-pay | Admitting: Family Medicine

## 2021-01-15 VITALS — BP 106/62 | HR 73 | Ht 64.0 in | Wt 224.2 lb

## 2021-01-15 DIAGNOSIS — R1012 Left upper quadrant pain: Secondary | ICD-10-CM

## 2021-01-15 DIAGNOSIS — R1084 Generalized abdominal pain: Secondary | ICD-10-CM | POA: Diagnosis not present

## 2021-01-15 DIAGNOSIS — Z3200 Encounter for pregnancy test, result unknown: Secondary | ICD-10-CM | POA: Diagnosis not present

## 2021-01-15 LAB — POCT URINE PREGNANCY: Preg Test, Ur: NEGATIVE

## 2021-01-15 NOTE — Progress Notes (Signed)
    SUBJECTIVE:   CHIEF COMPLAINT / HPI:  Patient states she is here to discuss the results of her recent pregnancy test.  She states she was not told the results of that test.  When I told her the test was negative she sugar had and said "no something is wrong".  Patient complains of abdominal pain.  She states it feels like "something is moving" inside her.  States her period ended yesterday.  She would still like a urine pregnancy test.   PERTINENT  PMH / PSH: Schizoaffective disorder  OBJECTIVE:   BP 106/62   Pulse 73   Ht 5\' 4"  (1.626 m)   Wt 224 lb 3.2 oz (101.7 kg)   SpO2 94%   BMI 38.48 kg/m   General: Alert and oriented.  No acute distress. CV: Regular rate and rhythm, no murmurs Pulmonary: Lungs clear to auscultation bilaterally GI: Tender to palpation in the left upper quadrant.  Normal bowel sounds. Psych: Speech and thought processes slowed.  ASSESSMENT/PLAN:   Generalized abdominal pain Patient complaining of the same abdominal pain she has had in the past.  She links it to being pregnant, which she has not.  She has schizoaffective disorder and is part of this believe she is pregnant despite negative pregnancy test results.  States she feels like "something is moving inside her belly".  Patient endorsed tenderness to palpation in the left upper quadrant.  Work-up from the previous time I saw her for this in December was negative.  H. pylori test was never done at that time.  We will do it today.  If negative, we could consider referral to GI for endoscopy.  Patient would benefit from regular visits with her PCP.     January, MD [facility]

## 2021-01-15 NOTE — Progress Notes (Deleted)
    SUBJECTIVE:   CHIEF COMPLAINT / HPI:   Pregnancy test: negative.  Always comes in for this.   PERTINENT  PMH / PSH: ***  OBJECTIVE:   There were no vitals taken for this visit.  ***  ASSESSMENT/PLAN:   No problem-specific Assessment & Plan notes found for this encounter.     Sandre Kitty, MD [facility]   {    This will disappear when note is signed, click to select method of visit    :1}

## 2021-01-15 NOTE — Patient Instructions (Signed)
It was nice to see you again today,  Your urine pregnancy test was negative.  I will call you with the results of the other test that we performed today when I get them.  Please schedule an appointment with your primary care provider, Dr. Maury Dus.  Have a great day,  Frederic Jericho, MD

## 2021-01-16 DIAGNOSIS — Z20822 Contact with and (suspected) exposure to covid-19: Secondary | ICD-10-CM | POA: Diagnosis not present

## 2021-01-17 LAB — H. PYLORI BREATH TEST: H pylori Breath Test: NEGATIVE

## 2021-01-17 NOTE — Assessment & Plan Note (Addendum)
Patient complaining of the same abdominal pain she has had in the past.  She links it to being pregnant, which she has not.  She has schizoaffective disorder and is part of this believe she is pregnant despite negative pregnancy test results.  States she feels like "something is moving inside her belly".  Patient endorsed tenderness to palpation in the left upper quadrant.  Work-up from the previous time I saw her for this in December was negative.  H. pylori test was never done at that time.  We will do it today.  If negative, we could consider referral to GI for endoscopy.  Patient would benefit from regular visits with her PCP.

## 2021-01-31 ENCOUNTER — Other Ambulatory Visit: Payer: Self-pay

## 2021-01-31 ENCOUNTER — Encounter (HOSPITAL_COMMUNITY): Payer: Self-pay | Admitting: Emergency Medicine

## 2021-01-31 ENCOUNTER — Emergency Department (HOSPITAL_COMMUNITY)
Admission: EM | Admit: 2021-01-31 | Discharge: 2021-01-31 | Disposition: A | Discharge status: Home / Self Care | Payer: Medicare HMO | Attending: Emergency Medicine | Admitting: Emergency Medicine

## 2021-01-31 ENCOUNTER — Encounter (HOSPITAL_COMMUNITY): Payer: Self-pay

## 2021-01-31 ENCOUNTER — Ambulatory Visit (HOSPITAL_COMMUNITY)
Admission: EM | Admit: 2021-01-31 | Discharge: 2021-01-31 | Disposition: A | Discharge status: Home / Self Care | Payer: Medicare HMO | Attending: Emergency Medicine | Admitting: Emergency Medicine

## 2021-01-31 DIAGNOSIS — Z3202 Encounter for pregnancy test, result negative: Secondary | ICD-10-CM

## 2021-01-31 DIAGNOSIS — R1084 Generalized abdominal pain: Secondary | ICD-10-CM | POA: Insufficient documentation

## 2021-01-31 DIAGNOSIS — F17210 Nicotine dependence, cigarettes, uncomplicated: Secondary | ICD-10-CM | POA: Insufficient documentation

## 2021-01-31 DIAGNOSIS — R110 Nausea: Secondary | ICD-10-CM | POA: Insufficient documentation

## 2021-01-31 DIAGNOSIS — R1030 Lower abdominal pain, unspecified: Secondary | ICD-10-CM | POA: Diagnosis not present

## 2021-01-31 DIAGNOSIS — R197 Diarrhea, unspecified: Secondary | ICD-10-CM | POA: Insufficient documentation

## 2021-01-31 DIAGNOSIS — R109 Unspecified abdominal pain: Secondary | ICD-10-CM

## 2021-01-31 LAB — POCT URINALYSIS DIPSTICK, ED / UC
Bilirubin Urine: NEGATIVE
Glucose, UA: NEGATIVE mg/dL
Ketones, ur: NEGATIVE mg/dL
Leukocytes,Ua: NEGATIVE
Nitrite: NEGATIVE
Protein, ur: NEGATIVE mg/dL
Specific Gravity, Urine: 1.015 (ref 1.005–1.030)
Urobilinogen, UA: 0.2 mg/dL (ref 0.0–1.0)
pH: 5.5 (ref 5.0–8.0)

## 2021-01-31 LAB — COMPREHENSIVE METABOLIC PANEL
ALT: 16 U/L (ref 0–44)
AST: 17 U/L (ref 15–41)
Albumin: 3.9 g/dL (ref 3.5–5.0)
Alkaline Phosphatase: 50 U/L (ref 38–126)
Anion gap: 6 (ref 5–15)
BUN: 12 mg/dL (ref 6–20)
CO2: 25 mmol/L (ref 22–32)
Calcium: 9.3 mg/dL (ref 8.9–10.3)
Chloride: 104 mmol/L (ref 98–111)
Creatinine, Ser: 0.74 mg/dL (ref 0.44–1.00)
GFR, Estimated: >60 mL/min (ref >60)
Glucose, Bld: 81 mg/dL (ref 70–99)
Potassium: 4 mmol/L (ref 3.5–5.1)
Sodium: 135 mmol/L (ref 135–145)
Total Bilirubin: 0.8 mg/dL (ref 0.3–1.2)
Total Protein: 7.3 g/dL (ref 6.5–8.1)

## 2021-01-31 LAB — CBC
HCT: 41.8 % (ref 36.0–46.0)
Hemoglobin: 13.6 g/dL (ref 12.0–15.0)
MCH: 29.6 pg (ref 26.0–34.0)
MCHC: 32.5 g/dL (ref 30.0–36.0)
MCV: 91.1 fL (ref 80.0–100.0)
Platelets: 370 10*3/uL (ref 150–400)
RBC: 4.59 MIL/uL (ref 3.87–5.11)
RDW: 12.8 % (ref 11.5–15.5)
WBC: 5.5 10*3/uL (ref 4.0–10.5)
nRBC: 0 % (ref 0.0–0.2)

## 2021-01-31 LAB — LIPASE, BLOOD: Lipase: 30 U/L (ref 11–51)

## 2021-01-31 LAB — POC URINE PREG, ED: Preg Test, Ur: NEGATIVE

## 2021-01-31 LAB — I-STAT BETA HCG BLOOD, ED (MC, WL, AP ONLY): I-stat hCG, quantitative: <5 m[IU]/mL (ref <5)

## 2021-01-31 MED ORDER — LIDOCAINE VISCOUS HCL 2 % MT SOLN
15.0000 mL | Freq: Once | OROMUCOSAL | Status: DC
  Filled 2021-01-31: qty 15

## 2021-01-31 MED ORDER — ALUM & MAG HYDROXIDE-SIMETH 200-200-20 MG/5ML PO SUSP
30.0000 mL | Freq: Once | ORAL | Status: DC
  Filled 2021-01-31: qty 30

## 2021-01-31 NOTE — ED Notes (Signed)
Patient is being discharged from the Urgent Care and sent to the Emergency Department via POV . Per Margit Hanks, patient is in need of higher level of care due to abdominal pain. Patient is aware and verbalizes understanding of plan of care.  Vitals:   01/31/21 1012  BP: (!) 112/45  Pulse: 94  Resp: 18  Temp: 97.8 F (36.6 C)  SpO2: 100%

## 2021-01-31 NOTE — ED Triage Notes (Signed)
Pt reports generalized abd pain x 3 days with nausea and diarrhea.  Denies vomiting and urinary complaints.  Pt states she believes she is 2 months pregnant and feels vibration in her stomach but she has had 2 negative pregnancy test.

## 2021-01-31 NOTE — ED Notes (Signed)
Provider notified of patient's refusal of meds.

## 2021-01-31 NOTE — ED Notes (Signed)
ED Provider at bedside. 

## 2021-01-31 NOTE — ED Notes (Signed)
Still states is unable to get urine for sample.
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
## 2021-01-31 NOTE — Discharge Instructions (Addendum)
You can take 600 mg of ibuprofen every 6 hours, you can take 1000 mg of Tylenol every 6 hours, you can alternate these every 3 or you can take them together.  

## 2021-01-31 NOTE — ED Triage Notes (Signed)
Pt presents with lower abdominal pain x 3 days. Reports she is 2 moths pregnant. Denies fever, vaginal discharge, dysuria.

## 2021-01-31 NOTE — ED Notes (Signed)
Got patient into a gown on the monitor patient is resting with call bell in reach 

## 2021-02-23 ENCOUNTER — Ambulatory Visit: Payer: Medicare HMO | Admitting: Family Medicine

## 2021-02-23 NOTE — Progress Notes (Deleted)
    SUBJECTIVE:   CHIEF COMPLAINT / HPI:   Abdominal Pain Patient presents w/abdominal pain. Has been seen many times in the past for the same complaint. Most recently seen in the ED on 01/31/21 Described as vibrating Thinks she is pregnant. Upreg negative on 01/31/21 LMP*** Sexually active Nexplanon  Med Rec ***  PERTINENT  PMH / PSH: schizoaffective disorder, bipolar disorder***  OBJECTIVE:   There were no vitals taken for this visit.  Gen: *** CV: *** GI: ***  ASSESSMENT/PLAN:   No problem-specific Assessment & Plan notes found for this encounter.     Maury Dus, MD [facility]   {    This will disappear when note is signed, click to select method of visit    :1}

## 2021-03-10 ENCOUNTER — Ambulatory Visit: Payer: Medicare HMO

## 2021-03-12 DIAGNOSIS — Z20822 Contact with and (suspected) exposure to covid-19: Secondary | ICD-10-CM | POA: Diagnosis not present

## 2021-03-19 ENCOUNTER — Ambulatory Visit (INDEPENDENT_AMBULATORY_CARE_PROVIDER_SITE_OTHER): Payer: Medicare HMO | Admitting: Family Medicine

## 2021-03-19 ENCOUNTER — Encounter: Payer: Self-pay | Admitting: Family Medicine

## 2021-03-19 ENCOUNTER — Other Ambulatory Visit: Payer: Self-pay

## 2021-03-19 VITALS — BP 120/78 | HR 81 | Ht 64.0 in | Wt 224.0 lb

## 2021-03-19 DIAGNOSIS — N926 Irregular menstruation, unspecified: Secondary | ICD-10-CM | POA: Diagnosis not present

## 2021-03-19 DIAGNOSIS — M25511 Pain in right shoulder: Secondary | ICD-10-CM | POA: Diagnosis not present

## 2021-03-19 LAB — POCT URINE PREGNANCY: Preg Test, Ur: NEGATIVE

## 2021-03-19 MED ORDER — KETOROLAC TROMETHAMINE 30 MG/ML IJ SOLN
30.0000 mg | Freq: Once | INTRAMUSCULAR | Status: AC
  Administered 2021-03-19: 30 mg via INTRAMUSCULAR

## 2021-03-19 MED ORDER — METHOCARBAMOL 500 MG PO TABS: 500.0000 mg | ORAL_TABLET | Freq: Three times a day (TID) | ORAL | 0 refills | Status: DC | PRN

## 2021-03-19 NOTE — Progress Notes (Signed)
    SUBJECTIVE:   CHIEF COMPLAINT / HPI:   Shoulder pain, right: Patient presents to clinic reporting that since the last month she started having shoulder pain.  She denies any traumas or accidents.  She reports that lifting up her arm hurts and she has not found any regimen that makes the pain better.  She has tried taking ibuprofen to decrease her pain but this was ineffective.  She denies the pain being worse at any particular time of day, she reports that the pain is constant.  Patient reports that the pain is located to the posterior aspect of her shoulder and occasionally to her right bicep.   PERTINENT  PMH / PSH:  Patient Active Problem List   Diagnosis Date Noted  . Acute pain of right shoulder 03/19/2021  . Missed period 09/05/2020  . Right arm pain 09/05/2020  . Groin pain, left 03/04/2020  . Encounter for pregnancy test, result negative 09/18/2019  . Generalized abdominal pain 08/03/2019  . Allergic rhinitis 06/03/2016  . Anxiety disorder in conditions classified elsewhere 03/05/2014  . Contraception management 05/11/2013  . Cannabis abuse 04/27/2013  . SCHIZOAFFECTIVE DISORDER 04/07/2010  . History of tobacco abuse 10/27/2009  . Bipolar disorder (HCC) 11/16/2007  . HYPERLIPIDEMIA 12/22/2006  . OBESITY, NOS 12/22/2006  . Multiple sclerosis (HCC) 12/22/2006    OBJECTIVE:   BP 120/78   Pulse 81   Ht 5\' 4"  (1.626 m)   Wt 224 lb (101.6 kg)   LMP 01/22/2021   SpO2 100%   BMI 38.45 kg/m    Physical exam: General: Nontoxic-appearing Respiratory: Comfortable work of breathing on room air, speaking complete sentences  Right Shoulder: exam is somewhat inconsistent Inspection reveals no obvious deformity, atrophy, or asymmetry. No bruising. No swelling Palpation no TTP over [place] joint or bicipital groove; TTP to posterior aspect of deltoid Decreased ROM in flexion, abduction, full ROM in internal/external rotation NV intact distally Normal scapular function  observed. Special Tests:  - Impingement: Neg Hawkins, cannot complete neers, pain with empty can sign. - Supraspinatous: Negative empty can.  5/5 strength with resisted flexion at 20 degrees - Infraspinatous/Teres Minor: 5/5 strength with ER - Subscapularis: negative belly press, negative bear hug. 5/5 strength with IR - Biceps tendon:  unable to complete Yerrgason's due to pain in forearm - Labrum: Negative Obriens, negative clunk, good stability - Glenohumeral joint Laxity: negative sulcus test - AC Joint: Negative cross arm - Positve painful arc, no drop arm sign    ASSESSMENT/PLAN:   Acute pain of right shoulder No accidents or traumas concerning for acute fracture.  Exam findings possibly positive for impingement or supraspinatus injury, however exam findings are also inconsistent.  Patient experiencing pain in her bicep however and unable to complete Yergason, patient without pain to palpation of bicipital groove.  Patient could be developing frozen shoulder.  Patient with tenderness to palpation of musculature especially trapezius. -Toradol shot given at today's visit -Patient given refill of Robaxin to reduce muscle aches -Patient also given shoulder exercises and asked to please do these twice daily -Can follow-up 2-4 weeks as needed     [facility], DO [facility]

## 2021-03-19 NOTE — Assessment & Plan Note (Signed)
No accidents or traumas concerning for acute fracture.  Exam findings possibly positive for impingement or supraspinatus injury, however exam findings are also inconsistent.  Patient experiencing pain in her bicep however and unable to complete Yergason, patient without pain to palpation of bicipital groove.  Patient could be developing frozen shoulder.  Patient with tenderness to palpation of musculature especially trapezius. -Toradol shot given at today's visit -Patient given refill of Robaxin to reduce muscle aches -Patient also given shoulder exercises and asked to please do these twice daily -Can follow-up 2-4 weeks as needed

## 2021-03-19 NOTE — Patient Instructions (Signed)
Thank you for coming in to see [place] today! Please see below to review our plan for today's visit:  1. Take Robaxin (great muscle relaxer) at night before bed time. You can take it upwards of 3 times daily as needed for severe muscle aches. You can also take Ibuprofen/Advil 200mg  4 times daily.  2. We gave you a Toradol shot today to reduce your pain.  3. Do your shoulder exercises twice daily.   Please call the clinic at [phone] if your symptoms worsen or you have any concerns. It was our pleasure to serve you!   Dr. [phone] [facility]   Shoulder Exercises Ask your health care provider which exercises are safe for you. Do exercises exactly as told by your health care provider and adjust them as directed. It is normal to feel mild stretching, pulling, tightness, or discomfort as you do these exercises. Stop right away if you feel sudden pain or your pain gets worse. Do not begin these exercises until told by your health care provider. Stretching exercises External rotation and abduction This exercise is sometimes called corner stretch. This exercise rotates your arm outward (external rotation) and moves your arm out from your body (abduction). 1. Stand in a doorway with one of your feet slightly in front of the other. This is called a staggered stance. If you cannot reach your forearms to the door frame, stand facing a corner of a room. 2. Choose one of the following positions as told by your health care provider: ? Place your hands and forearms on the door frame above your head. ? Place your hands and forearms on the door frame at the height of your head. ? Place your hands on the door frame at the height of your elbows. 3. Slowly move your weight onto your front foot until you feel a stretch across your chest and in the front of your shoulders. Keep your head and chest upright and keep your abdominal muscles tight. 4. Hold for _____10_____ seconds. 5. To  release the stretch, shift your weight to your back foot. Repeat ______3____ times. Complete this exercise ___2_______ times a day.   Extension, standing 1. Stand and hold a broomstick, a cane, or a similar object behind your back. ? Your hands should be a little wider than shoulder width apart. ? Your palms should face away from your back. 2. Keeping your elbows straight and your shoulder muscles relaxed, move the stick away from your body until you feel a stretch in your shoulders (extension). ? Avoid shrugging your shoulders while you move the stick. Keep your shoulder blades tucked down toward the middle of your back. 3. Hold for ____10______ seconds. 4. Slowly return to the starting position. Repeat _____3_____ times. Complete this exercise ____2______ times a day. Range-of-motion exercises Pendulum 1. Stand near a wall or a surface that you can hold onto for balance. 2. Bend at the waist and let your left / right arm hang straight down. Use your other arm to support you. Keep your back straight and do not lock your knees. 3. Relax your left / right arm and shoulder muscles, and move your hips and your trunk so your left / right arm swings freely. Your arm should swing because of the motion of your body, not because you are using your arm or shoulder muscles. 4. Keep moving your hips and trunk so your arm swings in the following directions, as told by your health care provider: ?  Side to side. ? Forward and backward. ? In clockwise and counterclockwise circles. 5. Continue each motion for _____10_____ seconds, or for as long as told by your health care provider. 6. Slowly return to the starting position. Repeat _____3_____ times. Complete this exercise ____2______ times a day.   Shoulder flexion, standing 1. Stand and hold a broomstick, a cane, or a similar object. Place your hands a little more than shoulder width apart on the object. Your left / right hand should be palm up, and your  other hand should be palm down. 2. Keep your elbow straight and your shoulder muscles relaxed. Push the stick up with your healthy arm to raise your left / right arm in front of your body, and then over your head until you feel a stretch in your shoulder (flexion). ? Avoid shrugging your shoulder while you raise your arm. Keep your shoulder blade tucked down toward the middle of your back. 3. Hold for _____10_____ seconds. 4. Slowly return to the starting position. Repeat ____3______ times. Complete this exercise ______2____ times a day.   Shoulder abduction, standing 1. Stand and hold a broomstick, a cane, or a similar object. Place your hands a little more than shoulder width apart on the object. Your left / right hand should be palm up, and your other hand should be palm down. 2. Keep your elbow straight and your shoulder muscles relaxed. Push the object across your body toward your left / right side. Raise your left / right arm to the side of your body (abduction) until you feel a stretch in your shoulder. ? Do not raise your arm above shoulder height unless your health care provider tells you to do that. ? If directed, raise your arm over your head. ? Avoid shrugging your shoulder while you raise your arm. Keep your shoulder blade tucked down toward the middle of your back. 3. Hold for ____10______ seconds. 4. Slowly return to the starting position. Repeat ____3______ times. Complete this exercise ____2______ times a day. Internal rotation 1. Place your left / right hand behind your back, palm up. 2. Use your other hand to dangle an exercise band, a towel, or a similar object over your shoulder. Grasp the band with your left / right hand so you are holding on to both ends. 3. Gently pull up on the band until you feel a stretch in the front of your left / right shoulder. The movement of your arm toward the center of your body is called internal rotation. ? Avoid shrugging your shoulder while  you raise your arm. Keep your shoulder blade tucked down toward the middle of your back. 4. Hold for ____10______ seconds. 5. Release the stretch by letting go of the band and lowering your hands. Repeat _____3_____ times. Complete this exercise ____2______ times a day.   Strengthening exercises External rotation 1. Sit in a stable chair without armrests. 2. Secure an exercise band to a stable object at elbow height on your left / right side. 3. Place a soft object, such as a folded towel or a small pillow, between your left / right upper arm and your body to move your elbow about 4 inches (10 cm) away from your side. 4. Hold the end of the exercise band so it is tight and there is no slack. 5. Keeping your elbow pressed against the soft object, slowly move your forearm out, away from your abdomen (external rotation). Keep your body steady so only your forearm moves. 6.  Hold for _____10_____ seconds. 7. Slowly return to the starting position. Repeat _____3_____ times. Complete this exercise ______2____ times a day.   Shoulder abduction 1. Sit in a stable chair without armrests, or stand up. 2. Hold a ____2 lb______ weight in your left / right hand, or hold an exercise band with both hands. 3. Start with your arms straight down and your left / right palm facing in, toward your body. 4. Slowly lift your left / right hand out to your side (abduction). Do not lift your hand above shoulder height unless your health care provider tells you that this is safe. ? Keep your arms straight. ? Avoid shrugging your shoulder while you do this movement. Keep your shoulder blade tucked down toward the middle of your back. 5. Hold for _____5_____ seconds. 6. Slowly lower your arm, and return to the starting position. Repeat _____10_____ times. Complete this exercise ____2______ times a day.   Shoulder extension 1. Sit in a stable chair without armrests, or stand up. 2. Secure an exercise band to a stable  object in front of you so it is at shoulder height. 3. Hold one end of the exercise band in each hand. Your palms should face each other. 4. Straighten your elbows and lift your hands up to shoulder height. 5. Step back, away from the secured end of the exercise band, until the band is tight and there is no slack. 6. Squeeze your shoulder blades together as you pull your hands down to the sides of your thighs (extension). Stop when your hands are straight down by your sides. Do not let your hands go behind your body. 7. Hold for ____10______ seconds. 8. Slowly return to the starting position. Repeat ______3____ times. Complete this exercise ______2____ times a day. Shoulder row 1. Sit in a stable chair without armrests, or stand up. 2. Secure an exercise band to a stable object in front of you so it is at waist height. 3. Hold one end of the exercise band in each hand. Position your palms so that your thumbs are facing the ceiling (neutral position). 4. Bend each of your elbows to a 90-degree angle (right angle) and keep your upper arms at your sides. 5. Step back until the band is tight and there is no slack. 6. Slowly pull your elbows back behind you. 7. Hold for _____10_____ seconds. 8. Slowly return to the starting position. Repeat _____3_____ times. Complete this exercise ______2____ times a day. Shoulder press-ups 1. Sit in a stable chair that has armrests. Sit upright, with your feet flat on the floor. 2. Put your hands on the armrests so your elbows are bent and your fingers are pointing forward. Your hands should be about even with the sides of your body. 3. Push down on the armrests and use your arms to lift yourself off the chair. Straighten your elbows and lift yourself up as much as you comfortably can. ? Move your shoulder blades down, and avoid letting your shoulders move up toward your ears. ? Keep your feet on the ground. As you get stronger, your feet should support less of  your body weight as you lift yourself up. 4. Hold for _____10_____ seconds. 5. Slowly lower yourself back into the chair. Repeat ____3______ times. Complete this exercise ____2______ times a day.   Wall push-ups 1. Stand so you are facing a stable wall. Your feet should be about one arm-length away from the wall. 2. Lean forward and place your palms on  the wall at shoulder height. 3. Keep your feet flat on the floor as you bend your elbows and lean forward toward the wall. 4. Hold for ____10______ seconds. 5. Straighten your elbows to push yourself back to the starting position. Repeat ____3______ times. Complete this exercise _____2_____ times a day.   This information is not intended to replace advice given to you by your health care provider. Make sure you discuss any questions you have with your health care provider. Document Revised: 02/02/2019 Document Reviewed: 11/10/2018 Elsevier Patient Education  2021 [employer].

## 2021-05-27 DIAGNOSIS — M6281 Muscle weakness (generalized): Secondary | ICD-10-CM | POA: Diagnosis not present

## 2021-05-27 DIAGNOSIS — Z6836 Body mass index (BMI) 36.0-36.9, adult: Secondary | ICD-10-CM | POA: Diagnosis not present

## 2021-05-27 DIAGNOSIS — R030 Elevated blood-pressure reading, without diagnosis of hypertension: Secondary | ICD-10-CM | POA: Diagnosis not present

## 2021-05-27 DIAGNOSIS — G35 Multiple sclerosis: Secondary | ICD-10-CM | POA: Diagnosis not present

## 2021-05-28 ENCOUNTER — Ambulatory Visit: Payer: Medicare HMO | Admitting: Family Medicine

## 2021-06-22 ENCOUNTER — Encounter: Payer: Self-pay | Admitting: Family Medicine

## 2021-06-22 ENCOUNTER — Ambulatory Visit (INDEPENDENT_AMBULATORY_CARE_PROVIDER_SITE_OTHER): Payer: Medicare HMO | Admitting: Family Medicine

## 2021-06-22 ENCOUNTER — Other Ambulatory Visit: Payer: Self-pay

## 2021-06-22 VITALS — BP 122/64 | HR 78 | Ht 64.0 in | Wt 227.0 lb

## 2021-06-22 DIAGNOSIS — Z3046 Encounter for surveillance of implantable subdermal contraceptive: Secondary | ICD-10-CM

## 2021-06-22 DIAGNOSIS — Z3200 Encounter for pregnancy test, result unknown: Secondary | ICD-10-CM | POA: Diagnosis not present

## 2021-06-22 DIAGNOSIS — Z3202 Encounter for pregnancy test, result negative: Secondary | ICD-10-CM

## 2021-06-22 LAB — POCT URINE PREGNANCY: Preg Test, Ur: NEGATIVE

## 2021-06-22 NOTE — Assessment & Plan Note (Signed)
Urine pregnancy test negative today. Of note, patient has had many encounters requesting pregnancy tests over the past few years, all of which have been negative. Suspect there is a component of pseudocyesis secondary to her psychiatric illness. Will check x-ray of left humerus to evaluate for presence of Nexplanon. Not palpable on exam today, but was placed in 2018 per chart review (records in Care Everywhere). Previously saw OBGYN who felt she would need referral to gen surg for removal once located.

## 2021-06-22 NOTE — Progress Notes (Signed)
    SUBJECTIVE:   CHIEF COMPLAINT / HPI:   Possible Pregnancy Patient presents with concern for possible pregnancy.  LMP June 24th, usually gets periods every month and they last almost 3 weeks, which she states has always been the case for her. Took home pregnancy test in July and it was negative. She endorses breast tenderness, hot flashes, and pain in her right lower abdomen twice per week. The pain is sharp, lasts a few seconds, and resolves with movement (if she gets up and walks). She is not sure if it's pregnancy or menopause. Sexually active with 1 female partner She had a Nexplanon placed in her left arm 4 years ago, has never been removed. Saw OBGYN in 2021 who recommended referral to gen surg because it could not be palpated on exam.  PERTINENT  PMH / PSH: bipolar disorder, schizoaffective disorder, obesity  OBJECTIVE:   BP 122/64   Pulse 78   Ht 5\' 4"  (1.626 m)   Wt 227 lb (103 kg)   SpO2 98%   BMI 38.96 kg/m   General: NAD, pleasant, able to participate in exam Respiratory: No respiratory distress Skin: warm and dry, no rashes noted Psych: Normal affect and mood Neuro: grossly intact MSK: no obvious palpable rod in left upper extremity   ASSESSMENT/PLAN:   Encounter for pregnancy test, result negative Urine pregnancy test negative today. Of note, patient has had many encounters requesting pregnancy tests over the past few years, all of which have been negative. Suspect there is a component of pseudocyesis secondary to her psychiatric illness. Will check x-ray of left humerus to evaluate for presence of Nexplanon. Not palpable on exam today, but was placed in 2018 per chart review (records in Care Everywhere). Previously saw OBGYN who felt she would need referral to gen surg for removal once located.    2019, MD [facility]

## 2021-06-22 NOTE — Patient Instructions (Signed)
It was great to see you!  Your pregnancy test was negative today.  It is important to figure out if the Nexplanon is still in your arm. If so, it needs to be removed because it was placed more than 4 years ago now.  You should go to [facility] to have an x-ray done to help locate the Nexplanon.  Take care and seek immediate care sooner if you develop any concerns.  Dr. [facility]

## 2021-07-03 ENCOUNTER — Telehealth: Payer: Self-pay | Admitting: *Deleted

## 2021-07-03 NOTE — Telephone Encounter (Signed)
Patient called asking for a letter stating that she is unable to perform community service.  She said that she mentioned this at her last appointment but forgot to get the note. Will forward to MD. Burnard Hawthorne

## 2021-07-06 NOTE — Telephone Encounter (Signed)
Called pt. No VM set up. If pt calls, please schedule her an appt to be seen. Sunday Spillers, CMA

## 2021-07-06 NOTE — Telephone Encounter (Signed)
This was not discussed at her last appointment. There is no reason in her medical history that she cannot perform community service. If she has an acute problem preventing her from performing community service, patient should make an appointment to discuss.

## 2021-07-10 ENCOUNTER — Telehealth: Payer: Medicare HMO | Admitting: Family Medicine

## 2021-07-10 ENCOUNTER — Encounter: Payer: Self-pay | Admitting: Family Medicine

## 2021-07-10 NOTE — Progress Notes (Deleted)
[facility] Telemedicine Visit  Patient consented to have virtual visit and was identified by name and date of birth. Method of visit: {TELEPHONE VS OOILN:79728}  Encounter participants: Patient: Allison Moore - located at *** Provider: Maury Dus - located at [facility] Others (if applicable): ***  Chief Complaint: ***  HPI:  ***  ROS: per HPI  Pertinent PMHx: schizoaffective disorder, bipolar disorder, MS? (Dismissed from neuro in 2016)  Exam:  There were no vitals taken for this visit.  Respiratory: ***  Assessment/Plan:  No problem-specific Assessment & Plan notes found for this encounter.    Time spent during visit with patient: *** minutes

## 2021-07-29 ENCOUNTER — Telehealth (INDEPENDENT_AMBULATORY_CARE_PROVIDER_SITE_OTHER): Payer: Medicare HMO | Admitting: Family Medicine

## 2021-07-29 ENCOUNTER — Other Ambulatory Visit: Payer: Self-pay

## 2021-07-29 DIAGNOSIS — F1410 Cocaine abuse, uncomplicated: Secondary | ICD-10-CM

## 2021-07-29 DIAGNOSIS — G35 Multiple sclerosis: Secondary | ICD-10-CM

## 2021-07-29 NOTE — Progress Notes (Signed)
[facility] Telemedicine Visit  Patient consented to have virtual visit and was identified by name and date of birth. Method of visit: Telephone  Encounter participants: Patient: Allison Moore - located at home Provider: Towanda Octave - located at home Others (if applicable):   Chief Complaint: Community service  HPI:  Pt is required to do community service due to getting caught with illicit substances earlier this year. She has been asked to do community service with [employer].Pt reports she has significant bilateral leg weakness due to multiple sclerosis which means she is unable to stand for long periods of time and therefore cannot do community service. She is requesting a letter excusing her from it. Pt lives in a hotel and mostly stays in her room. She ambulates with a walking aid. Letter should be titled to Gillermo Murdoch and faxed to [phone]   ROS: per HPI  Pertinent PMHx: MS, schizoaffective   Exam:  There were no vitals taken for this visit.  Respiratory: speaking in full sentences   Assessment/Plan:  Multiple sclerosis (HCC) Provided letter to send to drug deferral program to be excused from community service due to her multiple sclerosis.     Time spent during visit with patient: 7 minutes

## 2021-07-29 NOTE — Assessment & Plan Note (Signed)
Provided letter to send to drug deferral program to be excused from community service due to her multiple sclerosis.

## 2021-09-22 ENCOUNTER — Ambulatory Visit (INDEPENDENT_AMBULATORY_CARE_PROVIDER_SITE_OTHER): Payer: Medicare HMO | Admitting: Family Medicine

## 2021-09-22 ENCOUNTER — Other Ambulatory Visit: Payer: Self-pay

## 2021-09-22 VITALS — BP 127/59 | HR 73 | Wt 227.8 lb

## 2021-09-22 DIAGNOSIS — Z3046 Encounter for surveillance of implantable subdermal contraceptive: Secondary | ICD-10-CM | POA: Diagnosis not present

## 2021-09-22 DIAGNOSIS — Z3200 Encounter for pregnancy test, result unknown: Secondary | ICD-10-CM

## 2021-09-22 DIAGNOSIS — F3170 Bipolar disorder, currently in remission, most recent episode unspecified: Secondary | ICD-10-CM

## 2021-09-22 DIAGNOSIS — Z3202 Encounter for pregnancy test, result negative: Secondary | ICD-10-CM | POA: Diagnosis not present

## 2021-09-22 DIAGNOSIS — R1084 Generalized abdominal pain: Secondary | ICD-10-CM

## 2021-09-22 LAB — POCT URINE PREGNANCY: Preg Test, Ur: NEGATIVE

## 2021-09-22 NOTE — Patient Instructions (Addendum)
Your pregnancy test today was negative. For your stomach pains, I recommend trying Tylenol as well as considering over the counter omeprazole to see if that helps the pain. Your irregular cycles could also be related to your Nexplanon, which needs removal. It will be important for you to get your x-ray, you can go to the hospital for the imaging or [facility].

## 2021-09-22 NOTE — Progress Notes (Addendum)
    SUBJECTIVE:   CHIEF COMPLAINT / HPI:   Possible pregnancy Patient reports that she is having stomach pains and has not had her period for 3 months. She does not feel like her stools have changed, is not taking any medications for the pain, no new partners, and reports she has never had this pain before.   When discussing the negative pregnancy test, patient became upset and loudly requested a blood test because she reports that she had a negative urine test and a positive blood test 15 years ago when she was pregnant.   PERTINENT  PMH / PSH: Reviewed   OBJECTIVE:   BP (!) 127/59   Pulse 73   Wt 227 lb 12.8 oz (103.3 kg)   SpO2 100%   BMI 39.10 kg/m   General: NAD, well-appearing, well-nourished Respiratory: No respiratory distress, breathing comfortably, able to speak in full sentences Skin: warm and dry, no rashes noted on exposed skin Abd: soft, mild diffuse lower quadrant tenderness, no rebound tenderness Psych: Flat affect, intermittently irritated when discussing the negative pregnancy test  ASSESSMENT/PLAN:   Possible pregnancy Patient has been seen multiple times earlier in the year for similar presentation. Pregnancy test in the office today was negative. Educated patient on home pregnancy tests, patient was still insistent upon getting a blood hCG checked, which was ordered.  Generalized abdominal pain Patient with abdominal pain x3 months, reports that it has not been present before but several notes previously had noted similar symptoms.  Pregnancy test today was negative.  Physical exam with mild diffuse tenderness but no concerning masses and no rebound tenderness or concerning peritonitis.  Counseled patient on trialing Tylenol as well as monitoring her bowel movements and symptom onset or triggering factors.  Contraception Patient has Nexplanon previously placed, was seen by Ob/Gyn who said patient may need referral to general surgery for removal as it could not  be palpated. Patient did not yet get the X-ray to check for location, counseled patient to proceed with the imaging.     Evelena Leyden, DO [facility]

## 2021-09-23 LAB — BETA HCG QUANT (REF LAB): hCG Quant: <1 m[IU]/mL

## 2021-11-03 ENCOUNTER — Telehealth: Payer: Self-pay

## 2021-11-03 NOTE — Telephone Encounter (Signed)
Patient calls nurse line requesting (10/5) letter be mailed to her. Confirmed address with patient and letter placed in outgoing mail.

## 2021-12-31 ENCOUNTER — Encounter (HOSPITAL_COMMUNITY): Payer: Self-pay | Admitting: Emergency Medicine

## 2021-12-31 ENCOUNTER — Emergency Department (HOSPITAL_COMMUNITY)
Admission: EM | Admit: 2021-12-31 | Discharge: 2021-12-31 | Disposition: A | Discharge status: Home / Self Care | Payer: Medicare HMO | Attending: Emergency Medicine | Admitting: Emergency Medicine

## 2021-12-31 DIAGNOSIS — R1030 Lower abdominal pain, unspecified: Secondary | ICD-10-CM | POA: Diagnosis present

## 2021-12-31 DIAGNOSIS — R110 Nausea: Secondary | ICD-10-CM | POA: Diagnosis not present

## 2021-12-31 LAB — I-STAT BETA HCG BLOOD, ED (MC, WL, AP ONLY): I-stat hCG, quantitative: <5 m[IU]/mL (ref <5)

## 2021-12-31 LAB — CBC
HCT: 41.3 % (ref 36.0–46.0)
Hemoglobin: 13.2 g/dL (ref 12.0–15.0)
MCH: 29.6 pg (ref 26.0–34.0)
MCHC: 32 g/dL (ref 30.0–36.0)
MCV: 92.6 fL (ref 80.0–100.0)
Platelets: 399 10*3/uL (ref 150–400)
RBC: 4.46 MIL/uL (ref 3.87–5.11)
RDW: 13.2 % (ref 11.5–15.5)
WBC: 5.8 10*3/uL (ref 4.0–10.5)
nRBC: 0 % (ref 0.0–0.2)

## 2021-12-31 LAB — URINALYSIS, ROUTINE W REFLEX MICROSCOPIC
Bilirubin Urine: NEGATIVE
Glucose, UA: NEGATIVE mg/dL
Ketones, ur: NEGATIVE mg/dL
Nitrite: NEGATIVE
Protein, ur: NEGATIVE mg/dL
RBC / HPF: >50 RBC/hpf — ABNORMAL HIGH (ref 0–5)
Specific Gravity, Urine: 1.019 (ref 1.005–1.030)
pH: 6 (ref 5.0–8.0)

## 2021-12-31 LAB — COMPREHENSIVE METABOLIC PANEL
ALT: 12 U/L (ref 0–44)
AST: 15 U/L (ref 15–41)
Albumin: 3.9 g/dL (ref 3.5–5.0)
Alkaline Phosphatase: 49 U/L (ref 38–126)
Anion gap: 7 (ref 5–15)
BUN: 10 mg/dL (ref 6–20)
CO2: 25 mmol/L (ref 22–32)
Calcium: 9.3 mg/dL (ref 8.9–10.3)
Chloride: 103 mmol/L (ref 98–111)
Creatinine, Ser: 0.8 mg/dL (ref 0.44–1.00)
GFR, Estimated: >60 mL/min (ref >60)
Glucose, Bld: 82 mg/dL (ref 70–99)
Potassium: 3.9 mmol/L (ref 3.5–5.1)
Sodium: 135 mmol/L (ref 135–145)
Total Bilirubin: 0.3 mg/dL (ref 0.3–1.2)
Total Protein: 7 g/dL (ref 6.5–8.1)

## 2021-12-31 LAB — LIPASE, BLOOD: Lipase: 37 U/L (ref 11–51)

## 2021-12-31 MED ORDER — METRONIDAZOLE 500 MG PO TABS: 500.0000 mg | ORAL_TABLET | Freq: Two times a day (BID) | ORAL | 0 refills | Status: DC

## 2021-12-31 MED ORDER — METRONIDAZOLE 500 MG PO TABS
500.0000 mg | ORAL_TABLET | Freq: Once | ORAL | Status: AC
  Administered 2021-12-31: 20:00:00 500 mg via ORAL
  Filled 2021-12-31: qty 1

## 2021-12-31 NOTE — ED Provider Notes (Signed)
?[facility] EMERGENCY DEPARTMENT ?Provider Note ? ? ?CSN: [id number] ?Arrival date & time: 12/31/21  1321 ? ?  ? ?History ? ?Chief Complaint  ?Patient presents with  ? Abdominal Pain  ? ? ?Allison Moore is a 41 y.o. female. ? ?HPI ? ? 41 year old female with no admitted pmhx presents with intermittent abdominal pain and nausea for three months. States the pain is crampy, lower abdomen. Denies diarrhea, vaginal bleeding, vaginal discharge. Worried she may be pregnant. Has associated nausea but denies fever.  Patient is sexually active.  Denies any dysuria/hematuria, flank pain.  Does not have a primary doctor. ? ?Home Medications ?Prior to Admission medications   ?Medication Sig Start Date End Date Taking? Authorizing Provider  ?metroNIDAZOLE (FLAGYL) 500 MG tablet Take 1 tablet (500 mg total) by mouth 2 (two) times daily. 12/31/21  Yes Delquan Poucher, Clabe Seal, DO  ?fluticasone (FLOVENT DISKUS) 50 MCG/BLIST diskus inhaler Inhale into the lungs. ?Patient not taking: Reported on 05/05/2020  06/20/20  [provider]  ?trihexyphenidyl (ARTANE) 2 MG tablet Take 2 mg by mouth daily. 1/2 tablet at bedtime ?Patient not taking: Reported on 05/05/2020  06/20/20  [provider]  ?   ? ?Allergies    ?Norco [hydrocodone-acetaminophen], Cephalexin, Hydrocodone-acetaminophen, and Pork-derived products   ? ?Review of Systems   ?Review of Systems  ?Constitutional:  Negative for fever.  ?Respiratory:  Negative for shortness of breath.   ?Cardiovascular:  Negative for chest pain.  ?Gastrointestinal:  Positive for abdominal pain and nausea. Negative for blood in stool, diarrhea and vomiting.  ?Genitourinary:  Positive for pelvic pain. Negative for dysuria, frequency, vaginal bleeding and vaginal discharge.  ?Skin:  Negative for rash.  ?Neurological:  Negative for headaches.  ? ?Physical Exam ?Updated Vital Signs ?BP 100/60   Pulse 70   Temp 98.5 ?F (36.9 ?C)   Resp 18   SpO2 100%  ?Physical Exam ?Vitals  and nursing note reviewed.  ?Constitutional:   ?   General: She is not in acute distress. ?   Appearance: Normal appearance. She is not diaphoretic.  ?HENT:  ?   Head: Normocephalic.  ?   Mouth/Throat:  ?   Mouth: Mucous membranes are moist.  ?Cardiovascular:  ?   Rate and Rhythm: Normal rate.  ?Pulmonary:  ?   Effort: Pulmonary effort is normal. No respiratory distress.  ?Abdominal:  ?   General: Bowel sounds are normal. There is no distension.  ?   Palpations: Abdomen is soft.  ?   Tenderness: There is no abdominal tenderness. There is no guarding or rebound.  ?Skin: ?   General: Skin is warm.  ?Neurological:  ?   Mental Status: She is alert and oriented to person, place, and time. Mental status is at baseline.  ?Psychiatric:     ?   Mood and Affect: Mood normal.  ? ? ?ED Results / Procedures / Treatments   ?Labs ?(all labs ordered are listed, but only abnormal results are displayed) ?Labs Reviewed  ?URINALYSIS, ROUTINE W REFLEX MICROSCOPIC - Abnormal; Notable for the following components:  ?    Result Value  ? APPearance HAZY (*)   ? Hgb urine dipstick MODERATE (*)   ? Leukocytes,Ua TRACE (*)   ? RBC / HPF >50 (*)   ? Bacteria, UA MANY (*)   ? Trichomonas, UA PRESENT (*)   ? All other components within normal limits  ?LIPASE, BLOOD  ?COMPREHENSIVE METABOLIC PANEL  ?CBC  ?I-STAT BETA HCG BLOOD,  ED ([facility], [facility], [facility] ONLY)  ? ? ?EKG ?None ? ?Radiology ?No results found. ? ?Procedures ?Procedures  ? ? ?Medications Ordered in ED ?Medications  ?metroNIDAZOLE (FLAGYL) tablet 500 mg (500 mg Oral Given 12/31/21 1948)  ? ? ?ED Course/ Medical Decision Making/ A&P ?  ?                        ?Medical Decision Making ?Amount and/or Complexity of Data Reviewed ?Labs: ordered. ? ?Risk ?Prescription drug management. ? ? ?41 year old female presents emergency department with chronic abdominal pain, nausea.  Patient states she had had vomiting a couple months ago but nothing recent.  Denies any fever or diarrhea.  Denies any  genitourinary symptoms.  Admits to unprotected sex and believes she may be pregnant. ? ?Pregnancy test is negative.  Blood work is reassuring.  No leukocytosis, abdominal labs are normal.  Urinalysis shows bacteria/white blood cells, trichomonas.  I discussed with the patient what these results mean.  I recommended pelvic for full swab and STD testing.  However the patient has no concern for STD infection including GC/chlamydia.  Her lower abdominal pain does not seem acute, unilateral.  Low suspicion for TOA with no fever, leukocytosis or acute pelvic symptoms.  However I recommended full STD testing which the patient has declined.  Will treat the trichomonas found in the urine with Flagyl and recommend establishing care with OB/GYN for pelvic and full evaluation.  Otherwise lower suspicion of an acute abdominal process given the duration of symptoms and normal labs.  Patient able to p.o.,  No indication for emergent CT at this time. ? ?Patient at this time appears safe and stable for discharge and close outpatient follow up. Discharge plan and strict return to ED precautions discussed, patient verbalizes understanding and agreement. ? ? ? ? ? ? ? ?Final Clinical Impression(s) / ED Diagnoses ?Final diagnoses:  ?Lower abdominal pain  ? ? ?Rx / DC Orders ?ED Discharge Orders   ? ?      Ordered  ?  metroNIDAZOLE (FLAGYL) 500 MG tablet  2 times daily       ? 12/31/21 1940  ? ?  ?  ? ?  ? ? ?  ?Rozelle Logan, DO ?12/31/21 2031 ? ?

## 2021-12-31 NOTE — ED Triage Notes (Signed)
Patient here with complaint of abdominal pain and emesis for the last three months, patient states she thinks she might be pregnant. Reports two negative home pregnancy tests and having unprotected sex. Patient alert, oriented,and in no apparent distress at this time. ?

## 2021-12-31 NOTE — Discharge Instructions (Addendum)
You have been seen and discharged from the emergency department.  Your blood work was normal, pregnancy test was negative.  Your urinalysis showed a group of bacteria called trichomonas that is consistent with a bacterial vaginosis infection.  Take antibiotic as directed.  Do not drink alcohol while on this antibiotic as it can cause a violent reaction.  It is extremely important that you establish care and follow-up with your primary provider/OB/GYN for reevaluation, pelvic, further evaluation and further care. Take home medications as prescribed. If you have any worsening symptoms, worsening abdominal pain, high fevers or further concerns for your health please return to an emergency department for further evaluation. ?

## 2021-12-31 NOTE — ED Notes (Signed)
States just went to bathroom, tried to get pt to try to urinate again, pt refused, pt has urine cup ?

## 2022-03-30 ENCOUNTER — Encounter: Payer: Self-pay | Admitting: *Deleted

## 2022-03-31 ENCOUNTER — Telehealth: Payer: Self-pay | Admitting: Family Medicine

## 2022-03-31 NOTE — Telephone Encounter (Signed)
Called patient to schedule a New GYN appt

## 2022-04-03 ENCOUNTER — Emergency Department (HOSPITAL_COMMUNITY)
Admission: EM | Admit: 2022-04-03 | Discharge: 2022-04-03 | Disposition: A | Discharge status: Home / Self Care | Payer: Medicare HMO | Attending: Emergency Medicine | Admitting: Emergency Medicine

## 2022-04-03 ENCOUNTER — Other Ambulatory Visit: Payer: Self-pay

## 2022-04-03 ENCOUNTER — Emergency Department (HOSPITAL_COMMUNITY): Payer: Medicare HMO

## 2022-04-03 DIAGNOSIS — S4991XA Unspecified injury of right shoulder and upper arm, initial encounter: Secondary | ICD-10-CM

## 2022-04-03 DIAGNOSIS — S40811A Abrasion of right upper arm, initial encounter: Secondary | ICD-10-CM | POA: Diagnosis not present

## 2022-04-03 DIAGNOSIS — S40211A Abrasion of right shoulder, initial encounter: Secondary | ICD-10-CM | POA: Diagnosis not present

## 2022-04-03 MED ORDER — ACETAMINOPHEN 325 MG PO TABS: 650.0000 mg | ORAL_TABLET | Freq: Once | ORAL | Status: DC
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
## 2022-04-03 NOTE — ED Notes (Signed)
Pt left without tylenol, VS, and paperwork. RN was in another pts room and pt eloped.
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
## 2022-04-03 NOTE — Discharge Instructions (Signed)
Use the sling as needed for comfort.  Take Tylenol or Motrin as needed for pain.  See your doctor as needed for problems.

## 2022-05-31 DIAGNOSIS — G35 Multiple sclerosis: Secondary | ICD-10-CM | POA: Diagnosis not present

## 2022-05-31 DIAGNOSIS — M25511 Pain in right shoulder: Secondary | ICD-10-CM | POA: Diagnosis not present

## 2022-05-31 DIAGNOSIS — Z131 Encounter for screening for diabetes mellitus: Secondary | ICD-10-CM | POA: Diagnosis not present

## 2022-05-31 DIAGNOSIS — E782 Mixed hyperlipidemia: Secondary | ICD-10-CM | POA: Diagnosis not present

## 2022-08-31 DIAGNOSIS — E782 Mixed hyperlipidemia: Secondary | ICD-10-CM | POA: Diagnosis not present

## 2022-08-31 DIAGNOSIS — Z0001 Encounter for general adult medical examination with abnormal findings: Secondary | ICD-10-CM | POA: Diagnosis not present

## 2022-08-31 DIAGNOSIS — G35 Multiple sclerosis: Secondary | ICD-10-CM | POA: Diagnosis not present

## 2022-11-11 ENCOUNTER — Ambulatory Visit: Payer: 59 | Admitting: Podiatry

## 2022-11-18 ENCOUNTER — Ambulatory Visit (INDEPENDENT_AMBULATORY_CARE_PROVIDER_SITE_OTHER): Payer: 59 | Admitting: Podiatry

## 2022-11-18 DIAGNOSIS — Q828 Other specified congenital malformations of skin: Secondary | ICD-10-CM | POA: Diagnosis not present

## 2022-11-18 DIAGNOSIS — M792 Neuralgia and neuritis, unspecified: Secondary | ICD-10-CM | POA: Diagnosis not present

## 2022-11-18 DIAGNOSIS — G35 Multiple sclerosis: Secondary | ICD-10-CM | POA: Diagnosis not present

## 2022-11-18 MED ORDER — GABAPENTIN 300 MG PO CAPS: 300.0000 mg | ORAL_CAPSULE | Freq: Three times a day (TID) | ORAL | 3 refills | Status: DC
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
## 2022-11-21 ENCOUNTER — Encounter (HOSPITAL_COMMUNITY): Payer: Self-pay

## 2022-11-21 ENCOUNTER — Emergency Department (HOSPITAL_COMMUNITY): Payer: 59

## 2022-11-21 ENCOUNTER — Emergency Department (HOSPITAL_COMMUNITY)
Admission: EM | Admit: 2022-11-21 | Discharge: 2022-11-21 | Disposition: A | Discharge status: Home / Self Care | Payer: 59 | Attending: Emergency Medicine | Admitting: Emergency Medicine

## 2022-11-21 ENCOUNTER — Other Ambulatory Visit: Payer: Self-pay

## 2022-11-21 DIAGNOSIS — I10 Essential (primary) hypertension: Secondary | ICD-10-CM | POA: Diagnosis not present

## 2022-11-21 DIAGNOSIS — R0789 Other chest pain: Secondary | ICD-10-CM | POA: Diagnosis not present

## 2022-11-21 DIAGNOSIS — R079 Chest pain, unspecified: Secondary | ICD-10-CM | POA: Diagnosis not present

## 2022-11-21 LAB — TROPONIN I (HIGH SENSITIVITY): Troponin I (High Sensitivity): <2 ng/L (ref <18)

## 2022-11-21 LAB — BASIC METABOLIC PANEL
Anion gap: 8 (ref 5–15)
BUN: 10 mg/dL (ref 6–20)
CO2: 24 mmol/L (ref 22–32)
Calcium: 8.8 mg/dL — ABNORMAL LOW (ref 8.9–10.3)
Chloride: 105 mmol/L (ref 98–111)
Creatinine, Ser: 0.76 mg/dL (ref 0.44–1.00)
GFR, Estimated: >60 mL/min (ref >60)
Glucose, Bld: 84 mg/dL (ref 70–99)
Potassium: 3.7 mmol/L (ref 3.5–5.1)
Sodium: 137 mmol/L (ref 135–145)

## 2022-11-21 LAB — CBC
HCT: 40.4 % (ref 36.0–46.0)
Hemoglobin: 13.2 g/dL (ref 12.0–15.0)
MCH: 29.8 pg (ref 26.0–34.0)
MCHC: 32.7 g/dL (ref 30.0–36.0)
MCV: 91.2 fL (ref 80.0–100.0)
Platelets: 340 10*3/uL (ref 150–400)
RBC: 4.43 MIL/uL (ref 3.87–5.11)
RDW: 13.2 % (ref 11.5–15.5)
WBC: 5.7 10*3/uL (ref 4.0–10.5)
nRBC: 0 % (ref 0.0–0.2)

## 2022-11-21 LAB — I-STAT BETA HCG BLOOD, ED (MC, WL, AP ONLY): I-stat hCG, quantitative: <5 m[IU]/mL (ref <5)

## 2022-11-21 NOTE — ED Provider Notes (Signed)
[facility] EMERGENCY DEPARTMENT AT [facility] Provider Note   CSN: [id number] Arrival date & time: 11/21/22  1409     History  Chief Complaint  Patient presents with   Chest Pain    Allison Moore is a 42 y.o. female.  42 year old female with prior medical history as detailed below presents for evaluation.  Patient complains of left-sided anterior chest wall pain.  Patient's pain is worse with movement or with lifting.  Patient reports that the pain has been present for more than 12 hours.  Patient denies any specific inciting event.  Patient denies known history of cardiac disease.  Patient denies prior cardiac workup.  Patient denies associated nausea, vomiting, shortness of breath, back pain, other complaint.  She reports that she was given aspirin with EMS and this improved her discomfort.  The history is provided by the patient and medical records.       Home Medications Prior to Admission medications   Medication Sig Start Date End Date Taking? Authorizing Provider  gabapentin (NEURONTIN) 300 MG capsule Take 1 capsule (300 mg total) by mouth 3 (three) times daily. 11/18/22   Standiford, Jenelle Mages, DPM  metroNIDAZOLE (FLAGYL) 500 MG tablet Take 1 tablet (500 mg total) by mouth 2 (two) times daily. 12/31/21   Horton, Kristie M, DO  fluticasone (FLOVENT DISKUS) 50 MCG/BLIST diskus inhaler Inhale into the lungs. Patient not taking: Reported on 05/05/2020  06/20/20  [provider]  trihexyphenidyl (ARTANE) 2 MG tablet Take 2 mg by mouth daily. 1/2 tablet at bedtime Patient not taking: Reported on 05/05/2020  06/20/20  [provider]      Allergies    Norco [hydrocodone-acetaminophen], Cephalexin, Hydrocodone-acetaminophen, and Pork-derived products    Review of Systems   Review of Systems  All other systems reviewed and are negative.   Physical Exam Updated Vital Signs BP 137/88 (BP Location: Right Arm)   Pulse 70   Temp 99.2 F (37.3  C) (Oral)   Resp 18   Wt 103 kg   LMP 09/26/2022   SpO2 100%   BMI 38.98 kg/m  Physical Exam Vitals and nursing note reviewed.  Constitutional:      General: She is not in acute distress.    Appearance: Normal appearance. She is well-developed.  HENT:     Head: Normocephalic and atraumatic.  Eyes:     Conjunctiva/sclera: Conjunctivae normal.     Pupils: Pupils are equal, round, and reactive to light.  Cardiovascular:     Rate and Rhythm: Normal rate and regular rhythm.     Heart sounds: Normal heart sounds.  Pulmonary:     Effort: Pulmonary effort is normal. No respiratory distress.     Breath sounds: Normal breath sounds.  Chest:     Chest wall: Tenderness present.     Comments: Patient with reproducible tenderness overlying the left upper chest wall. Abdominal:     General: There is no distension.     Palpations: Abdomen is soft.     Tenderness: There is no abdominal tenderness.  Musculoskeletal:        General: No deformity. Normal range of motion.     Cervical back: Normal range of motion and neck supple.  Skin:    General: Skin is warm and dry.  Neurological:     General: No focal deficit present.     Mental Status: She is alert and oriented to person, place, and time.     ED Results / Procedures / Treatments  Labs (all labs ordered are listed, but only abnormal results are displayed) Labs Reviewed  BASIC METABOLIC PANEL - Abnormal; Notable for the following components:      Result Value   Calcium 8.8 (*)    All other components within normal limits  CBC  I-STAT BETA HCG BLOOD, ED ([facility], [facility], [facility] ONLY)  TROPONIN I (HIGH SENSITIVITY)    EKG EKG Interpretation  Date/Time:  Sunday November 21 2022 14:21:53 EST Ventricular Rate:  68 PR Interval:  219 QRS Duration: 110 QT Interval:  381 QTC Calculation: 406 R Axis:   19 Text Interpretation: Sinus rhythm Prolonged PR interval Low voltage, precordial leads Confirmed by Dene Gentry [phone]) on 11/21/2022  4:11:30 PM  Radiology DG Chest 1 View  Result Date: 11/21/2022 CLINICAL DATA:  Chest pain. EXAM: CHEST  1 VIEW COMPARISON:  June 20, 2020 FINDINGS: The heart size and mediastinal contours are within normal limits. Both lungs are clear. The visualized skeletal structures are unremarkable. IMPRESSION: No active disease. Electronically Signed   By: Dorise Bullion III M.D.   On: 11/21/2022 15:48    Procedures Procedures    Medications Ordered in ED Medications - No data to display  ED Course/ Medical Decision Making/ A&P                             Medical Decision Making Amount and/or Complexity of Data Reviewed Labs: ordered.    Medical Screen Complete  This patient presented to the ED with complaint of left sided chest pain.  This complaint involves an extensive number of treatment options. The initial differential diagnosis includes, but is not limited to, ACS, chest wall strain, metabolic abnormality, etc.  This presentation is: Acute, Self-Limited, Previously Undiagnosed, Uncertain Prognosis, Complicated, Systemic Symptoms, and Threat to Life/Bodily Function  Patient is presenting with complaint of left-sided chest wall pain.  Describe symptoms are not consistent with ACS.  EKG is without evidence of acute ischemia.  Troponin is undetectable.  Patient with greater than 12 hours of discomfort.  Exam is suggestive of muscular chest wall tenderness.  Patient's pain is reproduced with palpation and pressure on the left anterior chest wall.  Other screening labs obtained are without significant abnormality.  Patient is reassured by ED workup.  She understands need for close outpatient follow-up with her PCP.  She is agreeable with planned referral to cardiology for outpatient evaluation.  Importance of close follow-up was stressed.  Strict return precautions given and understood. Additional history obtained: External records from outside sources obtained and reviewed  including prior ED visits and prior Inpatient records.    Lab Tests:  I ordered and personally interpreted labs.  The pertinent results include: CBC, BMP, troponin, ECG   Imaging Studies ordered:  I ordered imaging studies including CXR  I independently visualized and interpreted obtained imaging which showed NAD I agree with the radiologist interpretation.   Cardiac Monitoring:  The patient was maintained on a cardiac monitor.  I personally viewed and interpreted the cardiac monitor which showed an underlying rhythm of: NSR  Problem List / ED Course:  Atypical chest pain   Reevaluation:  After the interventions noted above, I reevaluated the patient and found that they have: improved   Disposition:  After consideration of the diagnostic results and the patients response to treatment, I feel that the patent would benefit from close outpatient follow-up.          Final Clinical Impression(s) /  ED Diagnoses Final diagnoses:  Atypical chest pain    Rx / DC Orders ED Discharge Orders          Ordered    Ambulatory referral to Cardiology       Comments: If you have not heard from the Cardiology office within the next 72 hours please call [phone].   11/21/22 1721              Valarie Merino, MD 11/21/22 1726

## 2022-11-21 NOTE — ED Triage Notes (Signed)
C/o centralized CP upon waking this am.  Denies radiation and sob.  Ambulatory in triage.  Cbg with ems 98. Ems admin 324mg  asa.

## 2022-11-21 NOTE — Discharge Instructions (Addendum)
Return for any problem.  ?

## 2022-11-21 NOTE — ED Provider Triage Note (Signed)
Emergency Medicine Provider Triage Evaluation Note  Allison Moore , a 42 y.o. female  was evaluated in triage.  Pt complains of chest pain.  Patient woke up this morning due to chest pain which did not radiate into either arm or extends any shortness of breath.  Patient is concerned about his chest pain has not previously had pain like this.  Patient does not any prior cardiac history or any pulmonary conditions.  Patient not currently on any blood thinners.  Review of Systems  Positive: As above Negative: As above  Physical Exam  BP 137/88 (BP Location: Right Arm)   Pulse 70   Temp 99.2 F (37.3 C) (Oral)   Resp 18   Wt 103 kg   SpO2 100%   BMI 38.98 kg/m  Gen:   Awake, no distress  Resp:  Normal effort  MSK:   Moves extremities without difficulty  Other:    Medical Decision Making  Medically screening exam initiated at 2:28 PM.  Appropriate orders placed.  Allison Moore was informed that the remainder of the evaluation will be completed by another provider, this initial triage assessment does not replace that evaluation, and the importance of remaining in the ED until their evaluation is complete.     Luvenia Heller, PA-C 11/21/22 1504

## 2022-11-24 ENCOUNTER — Telehealth: Payer: Self-pay

## 2022-11-24 NOTE — Patient Outreach (Signed)
  Care Coordination TOC Note Transition Care Management Unsuccessful Follow-up Telephone Call  Date of discharge and from where:  [place] [facility] 11/23/22  Attempts:  1st Attempt- EMMI  Reason for unsuccessful TCM follow-up call:  Unable to leave message  [name], RN, BSN, CCM Care Management Coordinator [facility] Phone: [phone]: [phone]

## 2022-11-25 ENCOUNTER — Telehealth: Payer: Self-pay

## 2022-11-25 NOTE — Patient Outreach (Signed)
  Care Coordination TOC Note Transition Care Management Unsuccessful Follow-up Telephone Call  Date of discharge and from where:  [place] [facility] 11/21/22  Attempts:  2nd Attempt-EMMI  Reason for unsuccessful TCM follow-up call:  No answer/busy Patient referred to cardiology for follow up.  This Probation officer called [facility] and spoke with Abbe Amsterdam, they have made several attempts to reach patient as well.  Plan to try on 11/26/22.  [name], RN, BSN, CCM Care Management Coordinator [place] Phone: [phone]: [phone]

## 2022-11-26 ENCOUNTER — Telehealth: Payer: Self-pay

## 2022-11-26 NOTE — Patient Outreach (Signed)
  Care Coordination TOC Note Transition Care Management Unsuccessful Follow-up Telephone Call  Date of discharge and from where:  [place] [facility] 11/21/22  Attempts:  3rd Attempt-EMMI  Reason for unsuccessful TCM follow-up call:  No answer/busy [name], RN, BSN, CCM Care Management Coordinator [facility] Phone: [phone]: [phone]

## 2022-12-03 IMAGING — DX DG SHOULDER 2+V*R*
1 series · 3 of 3 positions shown · non-contrast
Comparison: None Available.

CLINICAL DATA: Trauma, pain

EXAM:
RIGHT SHOULDER - 2+ VIEW

[Series 1: shoulder · 0.14mm/px · 3 of 3 slices shown]
[im 1/3]
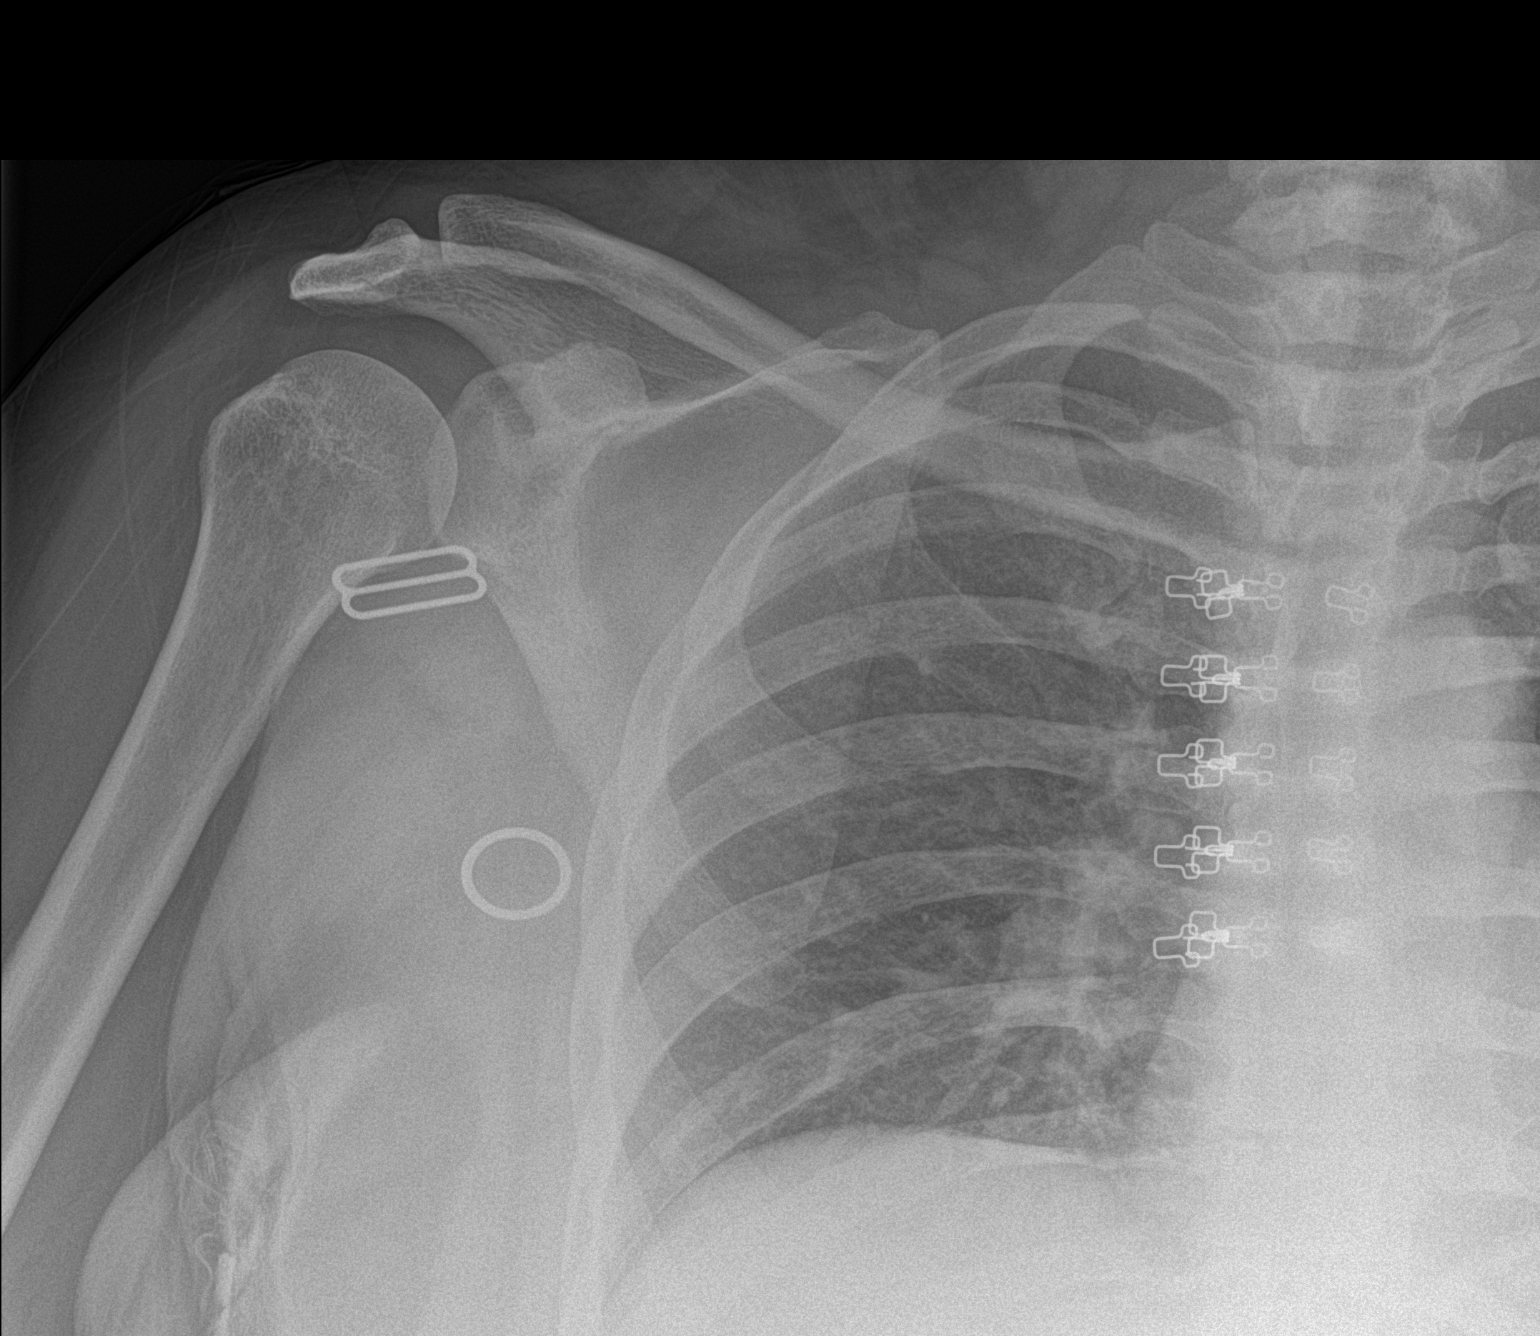
[im 2/3]
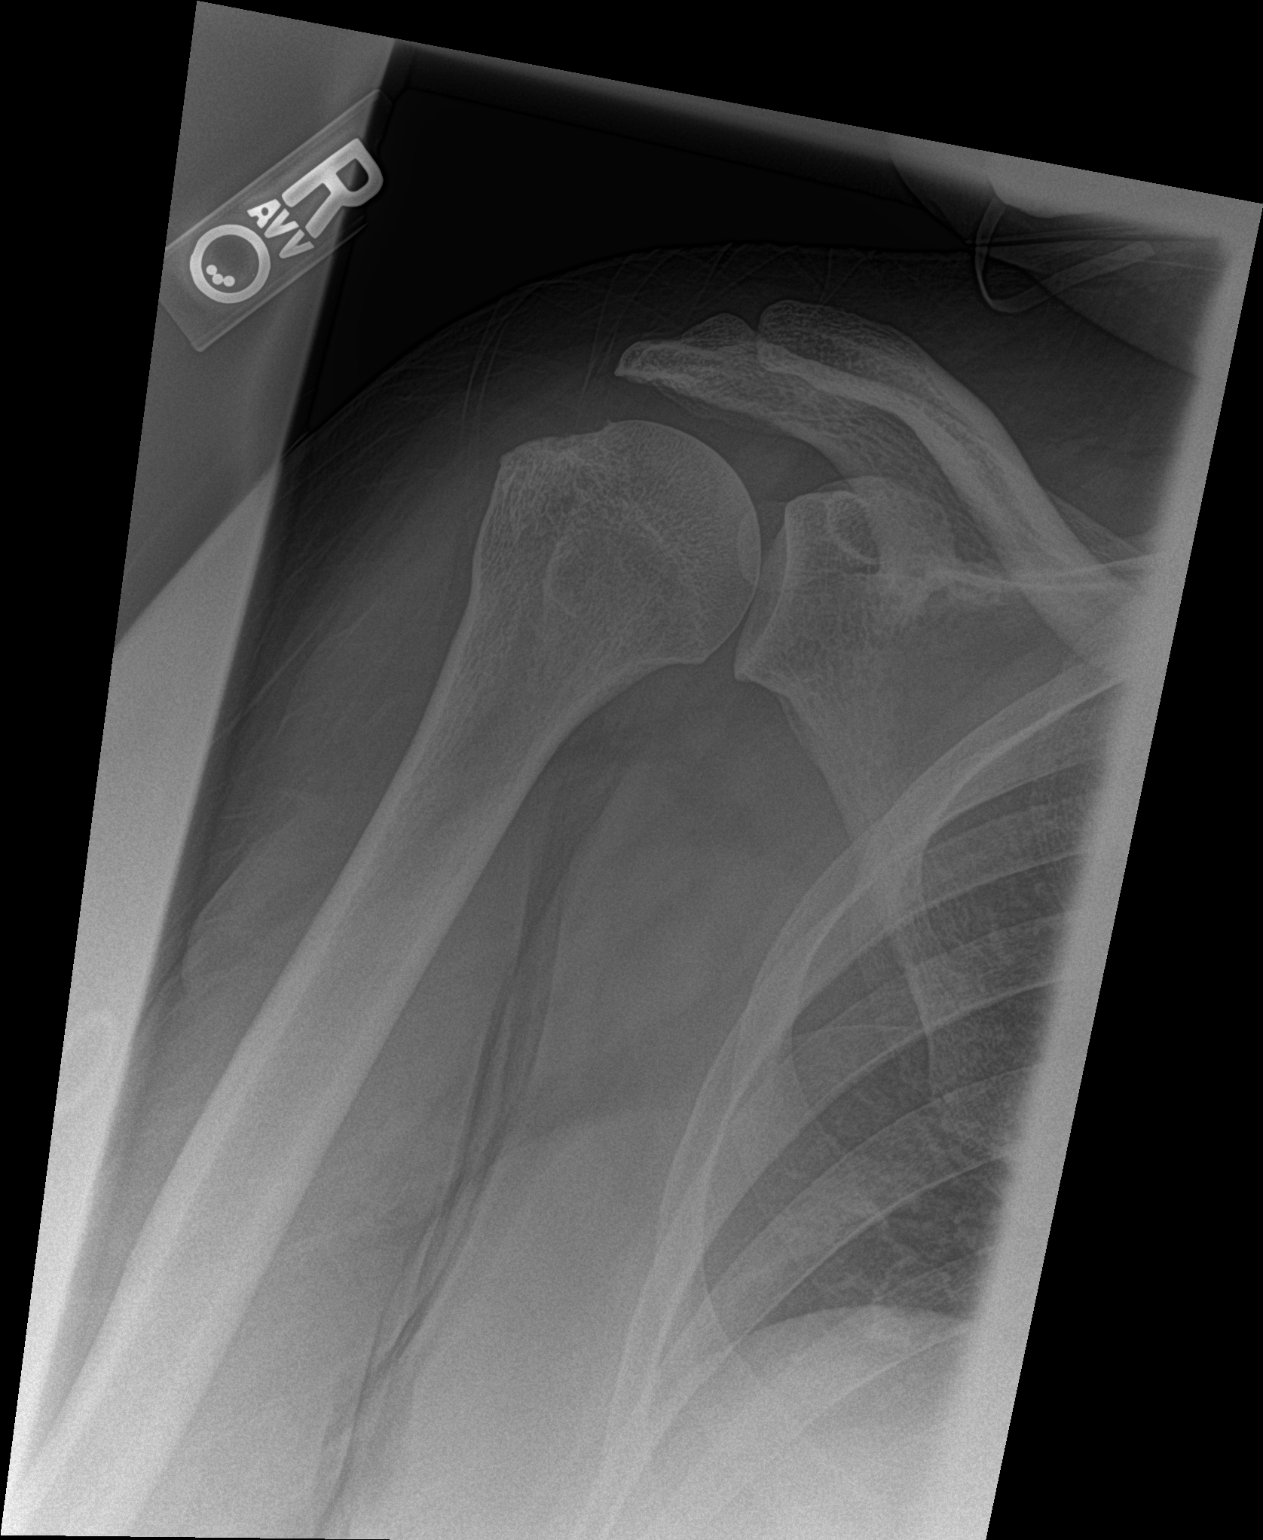
[im 3/3]
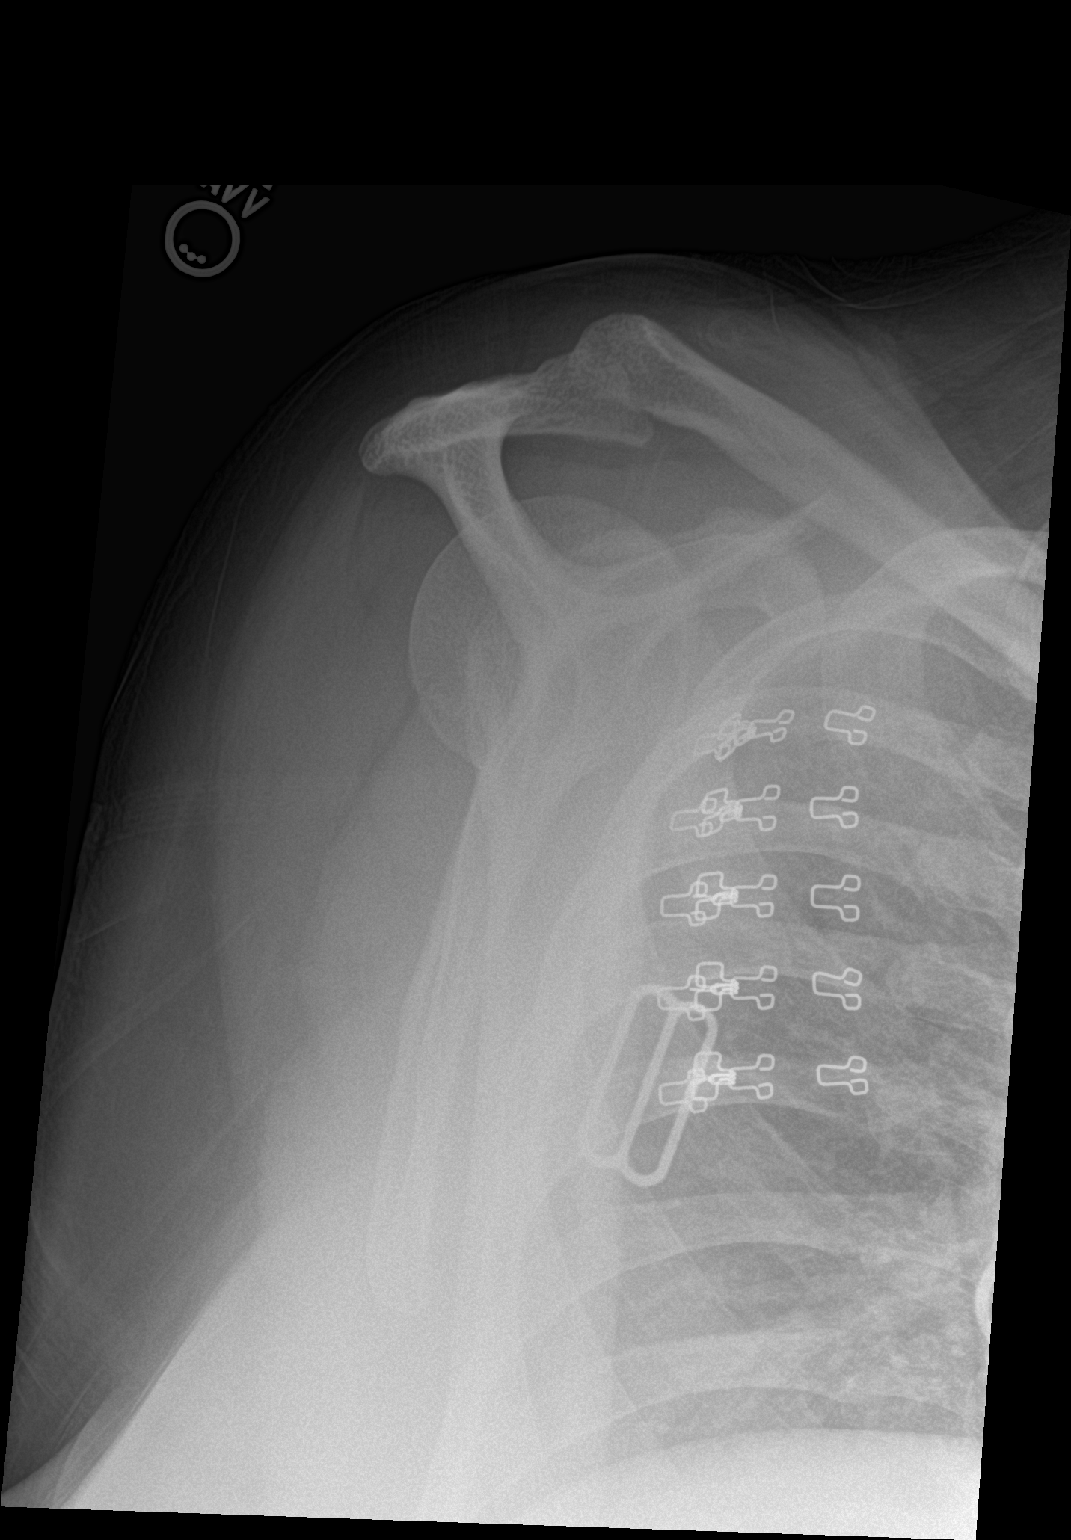

[3 of 3 positions shown; findings below may reference images not displayed]

FINDINGS: No fracture or dislocation is seen. There are no abnormal soft
tissue calcifications.
IMPRESSION: No fracture or dislocation is seen in the right shoulder.

## 2022-12-07 ENCOUNTER — Telehealth: Payer: Self-pay

## 2022-12-07 NOTE — Patient Outreach (Signed)
  Care Coordination TOC Note Transition Care Management Unsuccessful Follow-up Telephone Call  Date of discharge and from where:  [place] ED 11/21/22  Attempts:  3rd Attempt- Current attempt #4; Patient left voicemail on this writers phone, when return call made, patient did not answer.  Reason for unsuccessful TCM follow-up call:  No answer/busy  [name], RN, BSN, CCM Care Management Coordinator [facility] Phone: [phone]: [phone]

## 2022-12-07 NOTE — Transitions of Care (Post Inpatient/ED Visit) (Signed)
   12/07/2022  Name: Allison Moore MRN: [id number] DOB: 1981/09/09  Today's TOC FU Call Status: Today's TOC FU Call Status:: Successful TOC FU Call Competed TOC FU Call Complete Date: 12/07/22  Transition Care Management Follow-up Telephone Call Date of Discharge: 11/21/22 Discharge Facility: [facility] Type of Discharge: Emergency Department Reason for ED Visit: Other: (atypical chest pain) How have you been since you were released from the hospital?: Better Any questions or concerns?: No  Items Reviewed: Did you receive and understand the discharge instructions provided?: Yes Medications obtained and verified?: Yes (Medications Reviewed) Any new allergies since your discharge?: No Dietary orders reviewed?: No Do you have support at home?: Yes People in Home: parent(s)  Home Care and Equipment/Supplies: [place] Ordered?: No Any new equipment or medical supplies ordered?: No  Functional Questionnaire: Do you need assistance with bathing/showering or dressing?: No Do you need assistance with meal preparation?: No Do you need assistance with eating?: No Do you have difficulty maintaining continence: No Do you need assistance with getting out of bed/getting out of a chair/moving?: No Do you have difficulty managing or taking your medications?: No  Folllow up appointments reviewed: PCP Follow-up appointment confirmed?: NA Specialist Hospital Follow-up appointment confirmed?: Yes Date of Specialist follow-up appointment?: 12/16/22 Follow-Up Specialty Provider:: Dr. Irish Lack, Cardiologist Do you need transportation to your follow-up appointment?: No Do you understand care options if your condition(s) worsen?: Yes-patient verbalized understanding  SDOH Interventions Today    Flowsheet Row Most Recent Value  SDOH Interventions   Food Insecurity Interventions Intervention Not Indicated  Housing Interventions Intervention Not Indicated       [name],  RN, BSN, CCM Care Management Coordinator [facility] Phone: [phone]: [phone]

## 2022-12-16 ENCOUNTER — Ambulatory Visit: Payer: 59 | Attending: Interventional Cardiology | Admitting: Interventional Cardiology

## 2023-01-06 ENCOUNTER — Ambulatory Visit (INDEPENDENT_AMBULATORY_CARE_PROVIDER_SITE_OTHER): Payer: 59 | Admitting: Podiatry

## 2023-01-06 DIAGNOSIS — Z91199 Patient's noncompliance with other medical treatment and regimen due to unspecified reason: Secondary | ICD-10-CM

## 2023-01-06 NOTE — Progress Notes (Signed)
Pt was a no show for apt, charge generated 

## 2023-03-10 DIAGNOSIS — G35 Multiple sclerosis: Secondary | ICD-10-CM | POA: Diagnosis not present

## 2023-03-10 DIAGNOSIS — B372 Candidiasis of skin and nail: Secondary | ICD-10-CM | POA: Diagnosis not present

## 2023-03-10 DIAGNOSIS — E782 Mixed hyperlipidemia: Secondary | ICD-10-CM | POA: Diagnosis not present

## 2023-03-14 ENCOUNTER — Other Ambulatory Visit: Payer: Self-pay | Admitting: Physician Assistant

## 2023-03-14 DIAGNOSIS — Z1231 Encounter for screening mammogram for malignant neoplasm of breast: Secondary | ICD-10-CM

## 2023-08-08 DIAGNOSIS — Z0000 Encounter for general adult medical examination without abnormal findings: Secondary | ICD-10-CM | POA: Diagnosis not present

## 2023-08-08 DIAGNOSIS — Z720 Tobacco use: Secondary | ICD-10-CM | POA: Diagnosis not present

## 2023-08-08 DIAGNOSIS — Z23 Encounter for immunization: Secondary | ICD-10-CM | POA: Diagnosis not present

## 2023-08-08 DIAGNOSIS — G35 Multiple sclerosis: Secondary | ICD-10-CM | POA: Diagnosis not present

## 2023-08-08 DIAGNOSIS — R634 Abnormal weight loss: Secondary | ICD-10-CM | POA: Diagnosis not present

## 2023-08-08 DIAGNOSIS — E782 Mixed hyperlipidemia: Secondary | ICD-10-CM | POA: Diagnosis not present

## 2023-08-08 DIAGNOSIS — Z131 Encounter for screening for diabetes mellitus: Secondary | ICD-10-CM | POA: Diagnosis not present

## 2023-08-30 DIAGNOSIS — G35 Multiple sclerosis: Secondary | ICD-10-CM | POA: Diagnosis not present

## 2023-08-30 DIAGNOSIS — Z720 Tobacco use: Secondary | ICD-10-CM | POA: Diagnosis not present

## 2023-08-30 DIAGNOSIS — E782 Mixed hyperlipidemia: Secondary | ICD-10-CM | POA: Diagnosis not present

## 2023-09-09 ENCOUNTER — Encounter (HOSPITAL_COMMUNITY): Payer: Self-pay | Admitting: Emergency Medicine

## 2023-09-09 ENCOUNTER — Other Ambulatory Visit: Payer: Self-pay

## 2023-09-09 ENCOUNTER — Emergency Department (HOSPITAL_COMMUNITY)
Admission: EM | Admit: 2023-09-09 | Discharge: 2023-09-09 | Discharge status: Against Medical Advice | Payer: 59 | Attending: Emergency Medicine | Admitting: Emergency Medicine

## 2023-09-09 DIAGNOSIS — Z5321 Procedure and treatment not carried out due to patient leaving prior to being seen by health care provider: Secondary | ICD-10-CM | POA: Insufficient documentation

## 2023-09-09 DIAGNOSIS — F32A Depression, unspecified: Secondary | ICD-10-CM | POA: Diagnosis present

## 2023-09-09 LAB — RAPID URINE DRUG SCREEN, HOSP PERFORMED
Amphetamines: NOT DETECTED
Barbiturates: NOT DETECTED
Benzodiazepines: NOT DETECTED
Cocaine: NOT DETECTED
Opiates: NOT DETECTED
Tetrahydrocannabinol: POSITIVE — AB

## 2023-09-09 LAB — CBC
HCT: 39.5 % (ref 36.0–46.0)
Hemoglobin: 12.9 g/dL (ref 12.0–15.0)
MCH: 29.9 pg (ref 26.0–34.0)
MCHC: 32.7 g/dL (ref 30.0–36.0)
MCV: 91.6 fL (ref 80.0–100.0)
Platelets: 382 10*3/uL (ref 150–400)
RBC: 4.31 MIL/uL (ref 3.87–5.11)
RDW: 13 % (ref 11.5–15.5)
WBC: 5.6 10*3/uL (ref 4.0–10.5)
nRBC: 0 % (ref 0.0–0.2)

## 2023-09-09 LAB — COMPREHENSIVE METABOLIC PANEL
ALT: 14 U/L (ref 0–44)
AST: 12 U/L — ABNORMAL LOW (ref 15–41)
Albumin: 4.3 g/dL (ref 3.5–5.0)
Alkaline Phosphatase: 50 U/L (ref 38–126)
Anion gap: 7 (ref 5–15)
BUN: 13 mg/dL (ref 6–20)
CO2: 25 mmol/L (ref 22–32)
Calcium: 9.3 mg/dL (ref 8.9–10.3)
Chloride: 105 mmol/L (ref 98–111)
Creatinine, Ser: 0.73 mg/dL (ref 0.44–1.00)
GFR, Estimated: >60 mL/min (ref >60)
Glucose, Bld: 85 mg/dL (ref 70–99)
Potassium: 3.4 mmol/L — ABNORMAL LOW (ref 3.5–5.1)
Sodium: 137 mmol/L (ref 135–145)
Total Bilirubin: 0.5 mg/dL (ref <1.2)
Total Protein: 7.8 g/dL (ref 6.5–8.1)

## 2023-09-09 LAB — ETHANOL: Alcohol, Ethyl (B): <10 mg/dL (ref <10)

## 2023-09-09 LAB — SALICYLATE LEVEL: Salicylate Lvl: <7 mg/dL — ABNORMAL LOW (ref 7.0–30.0)

## 2023-09-09 LAB — HCG, SERUM, QUALITATIVE: Preg, Serum: NEGATIVE

## 2023-09-09 LAB — ACETAMINOPHEN LEVEL: Acetaminophen (Tylenol), Serum: <10 ug/mL — ABNORMAL LOW (ref 10–30)

## 2023-09-09 NOTE — ED Triage Notes (Signed)
Patient arrives ambulatory by POV- tearful in triage. States she called her mother upset stating she needs help. Patient states "I just need a break." Denies any SI/HI. States she is sad and depressed. That she was started on a medication (hydroxyzine) 8 weeks ago and it is not helping.

## 2023-11-10 ENCOUNTER — Emergency Department (EMERGENCY_DEPARTMENT_HOSPITAL)
Admission: EM | Admit: 2023-11-10 | Discharge: 2023-11-11 | Disposition: A | Discharge status: Other Acute Inpatient Hospital | Payer: 59 | Source: Home / Self Care | Attending: Emergency Medicine | Admitting: Emergency Medicine

## 2023-11-10 ENCOUNTER — Other Ambulatory Visit: Payer: Self-pay

## 2023-11-10 ENCOUNTER — Encounter (HOSPITAL_COMMUNITY): Payer: Self-pay

## 2023-11-10 DIAGNOSIS — R9431 Abnormal electrocardiogram [ECG] [EKG]: Secondary | ICD-10-CM | POA: Diagnosis not present

## 2023-11-10 DIAGNOSIS — F319 Bipolar disorder, unspecified: Secondary | ICD-10-CM | POA: Diagnosis present

## 2023-11-10 DIAGNOSIS — F3170 Bipolar disorder, currently in remission, most recent episode unspecified: Secondary | ICD-10-CM

## 2023-11-10 DIAGNOSIS — F32A Depression, unspecified: Secondary | ICD-10-CM

## 2023-11-10 DIAGNOSIS — F251 Schizoaffective disorder, depressive type: Secondary | ICD-10-CM | POA: Diagnosis not present

## 2023-11-10 DIAGNOSIS — F259 Schizoaffective disorder, unspecified: Secondary | ICD-10-CM | POA: Insufficient documentation

## 2023-11-10 LAB — COMPREHENSIVE METABOLIC PANEL
ALT: 15 U/L (ref 0–44)
AST: 13 U/L — ABNORMAL LOW (ref 15–41)
Albumin: 4.2 g/dL (ref 3.5–5.0)
Alkaline Phosphatase: 51 U/L (ref 38–126)
Anion gap: 8 (ref 5–15)
BUN: 10 mg/dL (ref 6–20)
CO2: 25 mmol/L (ref 22–32)
Calcium: 9.1 mg/dL (ref 8.9–10.3)
Chloride: 106 mmol/L (ref 98–111)
Creatinine, Ser: 0.57 mg/dL (ref 0.44–1.00)
GFR, Estimated: >60 mL/min (ref >60)
Glucose, Bld: 91 mg/dL (ref 70–99)
Potassium: 3.6 mmol/L (ref 3.5–5.1)
Sodium: 139 mmol/L (ref 135–145)
Total Bilirubin: 0.6 mg/dL (ref 0.0–1.2)
Total Protein: 7.6 g/dL (ref 6.5–8.1)

## 2023-11-10 LAB — CBC WITH DIFFERENTIAL/PLATELET
Abs Immature Granulocytes: 0.02 10*3/uL (ref 0.00–0.07)
Basophils Absolute: 0 10*3/uL (ref 0.0–0.1)
Basophils Relative: 0 %
Eosinophils Absolute: 0 10*3/uL (ref 0.0–0.5)
Eosinophils Relative: 0 %
HCT: 40.5 % (ref 36.0–46.0)
Hemoglobin: 12.8 g/dL (ref 12.0–15.0)
Immature Granulocytes: 0 %
Lymphocytes Relative: 30 %
Lymphs Abs: 2.4 10*3/uL (ref 0.7–4.0)
MCH: 29.8 pg (ref 26.0–34.0)
MCHC: 31.6 g/dL (ref 30.0–36.0)
MCV: 94.2 fL (ref 80.0–100.0)
Monocytes Absolute: 0.5 10*3/uL (ref 0.1–1.0)
Monocytes Relative: 6 %
Neutro Abs: 5 10*3/uL (ref 1.7–7.7)
Neutrophils Relative %: 64 %
Platelets: 356 10*3/uL (ref 150–400)
RBC: 4.3 MIL/uL (ref 3.87–5.11)
RDW: 13.5 % (ref 11.5–15.5)
WBC: 7.9 10*3/uL (ref 4.0–10.5)
nRBC: 0 % (ref 0.0–0.2)

## 2023-11-10 LAB — RAPID URINE DRUG SCREEN, HOSP PERFORMED
Amphetamines: NOT DETECTED
Barbiturates: NOT DETECTED
Benzodiazepines: NOT DETECTED
Cocaine: NOT DETECTED
Opiates: NOT DETECTED
Tetrahydrocannabinol: POSITIVE — AB

## 2023-11-10 LAB — ACETAMINOPHEN LEVEL: Acetaminophen (Tylenol), Serum: <10 ug/mL — ABNORMAL LOW (ref 10–30)

## 2023-11-10 LAB — HCG, SERUM, QUALITATIVE: Preg, Serum: NEGATIVE

## 2023-11-10 LAB — SALICYLATE LEVEL: Salicylate Lvl: <7 mg/dL — ABNORMAL LOW (ref 7.0–30.0)

## 2023-11-10 LAB — ETHANOL: Alcohol, Ethyl (B): <10 mg/dL (ref <10)

## 2023-11-10 MED ORDER — FLUOXETINE HCL 20 MG PO CAPS
20.0000 mg | ORAL_CAPSULE | Freq: Every day | ORAL | Status: DC
  Administered 2023-11-10 – 2023-11-11 (×2): 20 mg via ORAL
  Filled 2023-11-10 (×2): qty 1

## 2023-11-10 MED ORDER — HYDROXYZINE HCL 25 MG PO TABS: 25.0000 mg | ORAL_TABLET | Freq: Three times a day (TID) | ORAL | Status: DC | PRN

## 2023-11-10 MED ORDER — RISPERIDONE 0.5 MG PO TBDP
1.0000 mg | ORAL_TABLET | Freq: Every day | ORAL | Status: DC
  Administered 2023-11-10: 1 mg via ORAL
  Filled 2023-11-10: qty 2
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
## 2023-11-11 ENCOUNTER — Encounter (HOSPITAL_COMMUNITY): Payer: Self-pay | Admitting: Psychiatry

## 2023-11-11 ENCOUNTER — Other Ambulatory Visit: Payer: Self-pay

## 2023-11-11 ENCOUNTER — Inpatient Hospital Stay (HOSPITAL_COMMUNITY)
Admission: AD | Admit: 2023-11-11 | Discharge: 2023-11-25 | DRG: 885 | Disposition: A | Discharge status: Home / Self Care | Payer: 59 | Source: Intra-hospital | Attending: Psychiatry | Admitting: Psychiatry

## 2023-11-11 DIAGNOSIS — F251 Schizoaffective disorder, depressive type: Secondary | ICD-10-CM | POA: Diagnosis present

## 2023-11-11 DIAGNOSIS — E785 Hyperlipidemia, unspecified: Secondary | ICD-10-CM | POA: Diagnosis present

## 2023-11-11 DIAGNOSIS — F319 Bipolar disorder, unspecified: Secondary | ICD-10-CM | POA: Diagnosis present

## 2023-11-11 DIAGNOSIS — F419 Anxiety disorder, unspecified: Secondary | ICD-10-CM | POA: Diagnosis present

## 2023-11-11 DIAGNOSIS — Z560 Unemployment, unspecified: Secondary | ICD-10-CM

## 2023-11-11 DIAGNOSIS — Z5900 Homelessness unspecified: Secondary | ICD-10-CM

## 2023-11-11 DIAGNOSIS — Z5941 Food insecurity: Secondary | ICD-10-CM

## 2023-11-11 DIAGNOSIS — Z6838 Body mass index (BMI) 38.0-38.9, adult: Secondary | ICD-10-CM

## 2023-11-11 DIAGNOSIS — Z608 Other problems related to social environment: Secondary | ICD-10-CM | POA: Diagnosis present

## 2023-11-11 DIAGNOSIS — M5124 Other intervertebral disc displacement, thoracic region: Secondary | ICD-10-CM | POA: Diagnosis not present

## 2023-11-11 DIAGNOSIS — E669 Obesity, unspecified: Secondary | ICD-10-CM | POA: Diagnosis present

## 2023-11-11 DIAGNOSIS — M50222 Other cervical disc displacement at C5-C6 level: Secondary | ICD-10-CM | POA: Diagnosis not present

## 2023-11-11 DIAGNOSIS — Z596 Low income: Secondary | ICD-10-CM | POA: Diagnosis not present

## 2023-11-11 DIAGNOSIS — M4804 Spinal stenosis, thoracic region: Secondary | ICD-10-CM | POA: Diagnosis not present

## 2023-11-11 DIAGNOSIS — Z79899 Other long term (current) drug therapy: Secondary | ICD-10-CM | POA: Diagnosis not present

## 2023-11-11 DIAGNOSIS — Z91148 Patient's other noncompliance with medication regimen for other reason: Secondary | ICD-10-CM

## 2023-11-11 DIAGNOSIS — G35 Multiple sclerosis: Secondary | ICD-10-CM | POA: Diagnosis present

## 2023-11-11 DIAGNOSIS — Z885 Allergy status to narcotic agent status: Secondary | ICD-10-CM

## 2023-11-11 DIAGNOSIS — R9089 Other abnormal findings on diagnostic imaging of central nervous system: Secondary | ICD-10-CM | POA: Diagnosis not present

## 2023-11-11 DIAGNOSIS — Z803 Family history of malignant neoplasm of breast: Secondary | ICD-10-CM

## 2023-11-11 DIAGNOSIS — Z881 Allergy status to other antibiotic agents status: Secondary | ICD-10-CM

## 2023-11-11 DIAGNOSIS — K0889 Other specified disorders of teeth and supporting structures: Secondary | ICD-10-CM | POA: Diagnosis present

## 2023-11-11 DIAGNOSIS — F1290 Cannabis use, unspecified, uncomplicated: Secondary | ICD-10-CM | POA: Diagnosis present

## 2023-11-11 DIAGNOSIS — R45851 Suicidal ideations: Secondary | ICD-10-CM | POA: Diagnosis present

## 2023-11-11 DIAGNOSIS — M419 Scoliosis, unspecified: Secondary | ICD-10-CM | POA: Diagnosis not present

## 2023-11-11 DIAGNOSIS — M47814 Spondylosis without myelopathy or radiculopathy, thoracic region: Secondary | ICD-10-CM | POA: Diagnosis not present

## 2023-11-11 DIAGNOSIS — Z91014 Allergy to mammalian meats: Secondary | ICD-10-CM

## 2023-11-11 DIAGNOSIS — G319 Degenerative disease of nervous system, unspecified: Secondary | ICD-10-CM | POA: Diagnosis not present

## 2023-11-11 DIAGNOSIS — Z87891 Personal history of nicotine dependence: Secondary | ICD-10-CM

## 2023-11-11 DIAGNOSIS — M47812 Spondylosis without myelopathy or radiculopathy, cervical region: Secondary | ICD-10-CM | POA: Diagnosis not present

## 2023-11-11 DIAGNOSIS — F17210 Nicotine dependence, cigarettes, uncomplicated: Secondary | ICD-10-CM | POA: Diagnosis present

## 2023-11-11 DIAGNOSIS — M4802 Spinal stenosis, cervical region: Secondary | ICD-10-CM | POA: Diagnosis not present

## 2023-11-11 MED ORDER — IBUPROFEN 600 MG PO TABS
600.0000 mg | ORAL_TABLET | Freq: Four times a day (QID) | ORAL | Status: DC | PRN
Start: 2023-11-11 — End: 2023-11-25
  Administered 2023-11-12 – 2023-11-25 (×21): 600 mg via ORAL
  Filled 2023-11-11 (×21): qty 1

## 2023-11-11 MED ORDER — DIPHENHYDRAMINE HCL 50 MG/ML IJ SOLN: 50.0000 mg | Freq: Three times a day (TID) | INTRAMUSCULAR | Status: DC | PRN

## 2023-11-11 MED ORDER — HALOPERIDOL LACTATE 5 MG/ML IJ SOLN: 5.0000 mg | Freq: Three times a day (TID) | INTRAMUSCULAR | Status: DC | PRN

## 2023-11-11 MED ORDER — NICOTINE 21 MG/24HR TD PT24
21.0000 mg | MEDICATED_PATCH | Freq: Every day | TRANSDERMAL | Status: DC
  Administered 2023-11-11 – 2023-11-25 (×13): 21 mg via TRANSDERMAL
  Filled 2023-11-11 (×19): qty 1

## 2023-11-11 MED ORDER — HYDROXYZINE HCL 25 MG PO TABS
25.0000 mg | ORAL_TABLET | Freq: Three times a day (TID) | ORAL | Status: DC | PRN
  Administered 2023-11-11 – 2023-11-19 (×8): 25 mg via ORAL
  Filled 2023-11-11 (×9): qty 1

## 2023-11-11 MED ORDER — LORAZEPAM 2 MG/ML IJ SOLN: 2.0000 mg | Freq: Three times a day (TID) | INTRAMUSCULAR | Status: DC | PRN

## 2023-11-11 MED ORDER — MAGNESIUM HYDROXIDE 400 MG/5ML PO SUSP
30.0000 mL | Freq: Every day | ORAL | Status: DC | PRN
  Administered 2023-11-12 – 2023-11-14 (×2): 30 mL via ORAL
  Filled 2023-11-11 (×3): qty 30

## 2023-11-11 MED ORDER — ALUM & MAG HYDROXIDE-SIMETH 200-200-20 MG/5ML PO SUSP: 30.0000 mL | ORAL | Status: DC | PRN

## 2023-11-11 MED ORDER — DIPHENHYDRAMINE HCL 25 MG PO CAPS: 50.0000 mg | ORAL_CAPSULE | Freq: Three times a day (TID) | ORAL | Status: DC | PRN

## 2023-11-11 MED ORDER — RISPERIDONE 1 MG PO TBDP
1.0000 mg | ORAL_TABLET | Freq: Every day | ORAL | Status: DC
  Administered 2023-11-11 – 2023-11-13 (×3): 1 mg via ORAL
  Filled 2023-11-11 (×5): qty 1

## 2023-11-11 MED ORDER — GABAPENTIN 300 MG PO CAPS
300.0000 mg | ORAL_CAPSULE | Freq: Three times a day (TID) | ORAL | Status: DC
  Administered 2023-11-11 – 2023-11-25 (×43): 300 mg via ORAL
  Filled 2023-11-11 (×50): qty 1

## 2023-11-11 MED ORDER — HALOPERIDOL LACTATE 5 MG/ML IJ SOLN: 10.0000 mg | Freq: Three times a day (TID) | INTRAMUSCULAR | Status: DC | PRN

## 2023-11-11 MED ORDER — HALOPERIDOL 5 MG PO TABS: 5.0000 mg | ORAL_TABLET | Freq: Three times a day (TID) | ORAL | Status: DC | PRN

## 2023-11-11 MED ORDER — TRAZODONE HCL 50 MG PO TABS
50.0000 mg | ORAL_TABLET | Freq: Every evening | ORAL | Status: DC | PRN
  Administered 2023-11-11 – 2023-11-21 (×9): 50 mg via ORAL
  Filled 2023-11-11 (×11): qty 1

## 2023-11-11 MED ORDER — FLUOXETINE HCL 20 MG PO CAPS
20.0000 mg | ORAL_CAPSULE | Freq: Every day | ORAL | Status: DC
  Administered 2023-11-12 – 2023-11-16 (×5): 20 mg via ORAL
  Filled 2023-11-11 (×7): qty 1

## 2023-11-11 NOTE — Progress Notes (Addendum)
Pt was accepted to [facility] TODAY   11/11/2023; Bed Assignment PENDING Discharges and Signed Voluntary consent uploaded to pt's chart or faxed to [facility] [phone]  Pt meets inpatient criteria per Alona Bene, PMHNP  Attending Physician will be Dr. Phineas Inches, MD   Report can be called to: -Adult unit: [phone]  Pt can arrive after: [facility] 1st shift to follow up.  Care Team notified: Junior Karna Christmas, Night [facility] Herold Harms, Day [facility] [place], [place], PMHNP, [place],LCSWA    [place], [place] 11/11/2023 @ 1:59 AM

## 2023-11-11 NOTE — Plan of Care (Signed)

## 2023-11-11 NOTE — Progress Notes (Signed)
Pt has been accepted to [facility] on 11/11/2023 Bed assignment: 303-2   Pt meets inpatient criteria per: Alona Bene, PMHNP   Attending Physician will be: Phineas Inches, MD   Report can be called to: Adult unit: [phone]  Pt can arrive after 10:30 AM today   Care Team Notified: [facility] RN, Darnelle Catalan RN, Jacquelynn Cree [place], PMHNP   [place] Hasheem Voland LCSW-A   11/11/2023 10:08 AM

## 2023-11-11 NOTE — Plan of Care (Signed)
  Problem: Education: Goal: Emotional status will improve 11/11/2023 2327 by Jianna Drabik, Rejeana Brock, RN Outcome: Not Progressing 11/11/2023 2325 by Sender Rueb, Rejeana Brock, RN Outcome: Not Progressing Goal: Mental status will improve 11/11/2023 2327 by Gianni Mihalik, Rejeana Brock, RN Outcome: Not Progressing 11/11/2023 2325 by Shakeitha Umbaugh, Rejeana Brock, RN Outcome: Not Progressing

## 2023-11-11 NOTE — ED Notes (Signed)
[facility] [employer] was called and setup transportation.

## 2023-11-11 NOTE — Progress Notes (Signed)
Admission Note.  Pt Admitted to [facility] for Dr. Carron Curie services. Pt awake and alert.  Flat affect and depressed mood. Pt reports relationship and housing issues as stressors that caused suicidal thoughts.  Pt was searched with skin assessment performed and pt's belongings placed in locker 22.  Pt signed the admission paperwork. She was tearful at times and reluctant to answer questions.  Pt's gait is slow and she was placed on Fall precautions.Pt escorted to unit   and oriented to milieu and room.  Hygiene supplies and towels given to pt.  She went down with peers for lunch.  No distress noted. Q 15 min checks for safety initiated.

## 2023-11-11 NOTE — Group Note (Signed)
Date:  11/11/2023 Time:  4:52 PM  Group Topic/Focus:  Dimensions of Wellness:   The focus of this group is to introduce the topic of wellness and discuss the role each dimension of wellness plays in total health.    Participation Level:  Did Not Attend  Participation Quality:   n/a  Affect:   n/a  Cognitive:   n/a  Insight: None  Engagement in Group:   n/a  Modes of Intervention:   n/a  Additional Comments:   Pt did not attend.  Edmund Hilda Prathik Aman 11/11/2023, 4:52 PM

## 2023-11-11 NOTE — Plan of Care (Signed)
  Problem: Education: Goal: Emotional status will improve Outcome: Not Progressing Goal: Mental status will improve Outcome: Not Progressing   

## 2023-11-11 NOTE — Progress Notes (Signed)
   11/11/23 1600  Psych Admission Type (Psych Patients Only)  Admission Status Voluntary  Psychosocial Assessment  Patient Complaints Depression;Crying spells  Eye Contact Fair  Facial Expression Flat  Affect Depressed  Speech Soft  Interaction Needy  Motor Activity Slow;Unsteady;Other (Comment) (history of MS)  Appearance/Hygiene Body odor;Poor hygiene  Behavior Characteristics Cooperative  Mood Depressed;Sad  Aggressive Behavior  Effect No apparent injury  Thought Process  Coherency Concrete thinking  Content Preoccupation  Delusions None reported or observed  Perception WDL  Hallucination Auditory  Judgment Limited  Confusion None  Danger to Self  Current suicidal ideation? Denies  Danger to Others  Danger to Others None reported or observed

## 2023-11-11 NOTE — ED Notes (Signed)
Report was given to RN Francee Piccolo at [facility]

## 2023-11-11 NOTE — Progress Notes (Signed)
   11/11/23 2113  Psych Admission Type (Psych Patients Only)  Admission Status Voluntary  Psychosocial Assessment  Patient Complaints Depression  Eye Contact Fair  Facial Expression Flat  Affect Depressed  Speech Soft  Interaction Cautious  Motor Activity Unsteady;Slow;Other (Comment) (HX: MS)  Appearance/Hygiene Improved  Behavior Characteristics Cooperative  Mood Depressed;Anxious;Sad  Thought Administrator, sports thinking  Content Preoccupation  Delusions None reported or observed  Perception WDL  Hallucination Auditory  Judgment Limited  Confusion None  Danger to Self  Current suicidal ideation? Denies  Danger to Others  Danger to Others None reported or observed

## 2023-11-11 NOTE — ED Provider Notes (Signed)
Emergency Medicine Observation Re-evaluation Note  Allison Moore is a 43 y.o. female, seen on rounds today.  Pt initially presented to the ED for complaints of Psychiatric Evaluation Currently, the patient is sleeping.  Physical Exam  BP 136/79 (BP Location: Right Arm)   Pulse 70   Temp 97.9 F (36.6 C) (Oral)   Resp 20   Ht 1.626 m (5\' 4" )   Wt 103 kg   SpO2 100%   BMI 38.98 kg/m  Physical Exam General: nad Cardiac: regular rate Lungs: normal effort   ED Course / MDM  EKG:EKG Interpretation Date/Time:  Thursday November 10 2023 11:37:51 EST Ventricular Rate:  65 PR Interval:  199 QRS Duration:  96 QT Interval:  399 QTC Calculation: 415 R Axis:   30  Text Interpretation: Age not entered, assumed to be  43 years old for purpose of ECG interpretation Sinus rhythm Anteroseptal infarct, age indeterminate Confirmed by Lorre Nick ([id number]) on 11/10/2023 3:41:37 PM  I have reviewed the labs performed to date as well as medications administered while in observation.  Recent changes in the last 24 hours include psychiatric evaluation.  Plan  Current plan is for admission to behavioral health Hospital today.    Linwood Dibbles, MD 11/11/23 [phone]

## 2023-11-12 NOTE — Progress Notes (Signed)
   11/12/23 0550  15 Minute Checks  Location Bedroom  Visual Appearance Calm  Behavior Composed  Sleep (Behavioral Health Patients Only)  Calculate sleep? (Click Yes once per 24 hr at 0600 safety check) Yes  Documented sleep last 24 hours 6

## 2023-11-12 NOTE — [facility] Group Notes (Signed)
[facility] Group Notes:  (Nursing/MHT/Case Management/Adjunct)  Date:  11/12/2023  Time:  9:05 AM  Type of Therapy:   Goals  Participation Level:  Active  Participation Quality:  Appropriate  Affect:  Appropriate  Cognitive:  Appropriate  Insight:  Appropriate  Engagement in Group:  Limited  Modes of Intervention:  Discussion and Orientation  Summary of Progress/Problems: Goal is to get her meds under control  Allison Moore Course 11/12/2023, 9:05 AM

## 2023-11-12 NOTE — Progress Notes (Signed)
  Patient pushed alarm.  Patient had an incontinent event.  Patient bed was sanitized and clean linen was placed and provided.    Patient reported, "the Trazodone had me dreaming."  Patient also reported, "this happens sometimes but I thought it was over."  Patient further reported, "I need help, I don't know what to do or what I'm doing wrong."  Patient was provided emotional support.    Patient bathed but seemed confused when attempting to put on undergarments and gown.  Per the patient's request, RN provided large diaper in which patient required assistance.  Patient is safe on the unit with q15 minute safety checks.

## 2023-11-12 NOTE — [facility] Group Notes (Signed)
Adult Psychoeducational Group Note  Date:  11/12/2023 Time:  5:18 AM  Group Topic/Focus:  Wrap-Up Group:   The focus of this group is to help patients review their daily goal of treatment and discuss progress on daily workbooks.  Participation Level:  Active  Participation Quality:  Appropriate  Affect:  Appropriate  Cognitive:  Appropriate  Insight: Appropriate  Engagement in Group:  Engaged  Modes of Intervention:  Discussion  Additional Comments:  Attended AA group.  Joselyn Arrow 11/12/2023, 5:18 AM

## 2023-11-12 NOTE — Progress Notes (Signed)
   11/12/23 2200  Psych Admission Type (Psych Patients Only)  Admission Status Voluntary  Psychosocial Assessment  Patient Complaints Anxiety;Depression  Eye Contact Fair  Facial Expression Anxious  Affect Apprehensive  Speech Soft  Interaction Guarded;Minimal  Motor Activity Slow  Appearance/Hygiene Improved  Behavior Characteristics Appropriate to situation  Mood Pleasant  Thought Process  Coherency Concrete thinking  Content Preoccupation  Delusions None reported or observed  Perception WDL  Hallucination Auditory  Judgment Limited  Confusion Mild  Danger to Self  Current suicidal ideation? Denies  Danger to Others  Danger to Others None reported or observed

## 2023-11-12 NOTE — [facility] Group Notes (Signed)
[facility] Group Notes:  (Nursing)  Date:  11/12/2023  Time:  1400  Type of Therapy:  Psychoeducational Skills  Participation Level:  Active  Participation Quality:  Attentive  Affect:  Appropriate  Cognitive:  Alert  Insight:  Improving  Engagement in Group:  Improving  Modes of Intervention:  Activity, Discussion, Exploration, Rapport Building, Socialization, and Support  Summary of Progress/Problems:  Allison Moore 11/12/2023, 3:23 PM
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
## 2023-11-12 NOTE — Progress Notes (Signed)
  Patient presented with moderate anxiety and requested medication for sleep.  Administered PRN Hydroxyzine per [facility] per patient request.

## 2023-11-12 NOTE — [facility] Group Notes (Signed)
Type of Therapy and Topic:  Group Therapy:  Boundaries  Participation Level:  Active   Description of Group:  Patients in this group were introduced to  3 types of healthy boundaries to address the extent to which we allow others into our lives, how we communicate, and where we draw the line when it comes to our personal space, emotions, and needs.  Different types of boundaries were defined and described, and each type was discussed with understanding.  Patients discussed what additional healthy boundaries could be helpful in their recovery and wellness after discharge in order to prevent future hospitalizations.   An emphasis was placed on following up with the discharge plan when they leave the hospital in order to continue becoming healthier and happier.    Therapeutic Goals: 1)  demonstrate and identify the type of unhealthy and healthy boundaries  2)  discuss healthy boundaries and how to address it  3)  identify the patient's current level of healthy boundaries and   4)  elicit communicating healthy boundaries and trust    Summary of Patient Progress:  The patient expressed she comprehension of the concepts presented, and understand that there is a need to communicate healthy boundaries.  Current boundaries include enhancing communication and trust.  The patient indicated one healthy boundary that could be helpful if added would be maintaining individuality and self esteem.  She participated with validation and understanding.  Therapeutic Modalities:   Psychoeducation Brief Solution-Focused Therapy

## 2023-11-13 DIAGNOSIS — F251 Schizoaffective disorder, depressive type: Secondary | ICD-10-CM

## 2023-11-13 NOTE — Group Note (Signed)
Date:  11/13/2023 Time:  2:55 PM  Group Topic/Focus:   Overcoming Anger:   The focus of this group is to define anger, discuss myths, and identify anger cues to help patients assess their triggers.    Participation Level:  None  Participation Quality:  Appropriate  Affect:  Appropriate  Cognitive:  Appropriate  Insight: Appropriate  Engagement in Group:  None  Modes of Intervention:  Discussion and Education  Additional Comments:    Arnoldo Hooker 11/13/2023, 2:55 PM

## 2023-11-13 NOTE — Progress Notes (Signed)
PT Cancellation Note  Patient Details Name: Allison Moore MRN: [id number] DOB: 01/07/1981   Cancelled Treatment:    Reason Eval/Treat Not Completed: Other (comment)  PT order received. Our OT who covers BH will see and assess pt. If any further PT needs, he will let us know. If he can address all issues then PT will sign off.   Thank you    Delice Bison, PT  Acute Rehab Dept [facility]) [phone]  11/13/2023  [facility] 11/13/2023, 1:23 PM

## 2023-11-13 NOTE — Plan of Care (Signed)
  Problem: Activity: Goal: Interest or engagement in activities will improve Outcome: Progressing   Problem: Safety: Goal: Periods of time without injury will increase Outcome: Progressing   

## 2023-11-13 NOTE — Progress Notes (Signed)
   11/13/23 2125  Psych Admission Type (Psych Patients Only)  Admission Status Voluntary  Psychosocial Assessment  Patient Complaints Depression  Eye Contact Fair  Facial Expression Flat  Affect Appropriate to circumstance  Speech Logical/coherent  Interaction Minimal  Motor Activity Slow  Appearance/Hygiene Poor hygiene  Behavior Characteristics Appropriate to situation  Mood Pleasant;Depressed  Thought Process  Coherency Concrete thinking  Content Preoccupation  Delusions None reported or observed  Perception WDL  Hallucination Auditory  Judgment Limited  Confusion None  Danger to Self  Current suicidal ideation? Denies  Danger to Others  Danger to Others None reported or observed

## 2023-11-13 NOTE — Progress Notes (Signed)
   11/13/23 0530  15 Minute Checks  Location Bedroom  Visual Appearance Calm  Behavior Sleeping  Sleep (Behavioral Health Patients Only)  Calculate sleep? (Click Yes once per 24 hr at 0600 safety check) Yes  Documented sleep last 24 hours 7

## 2023-11-13 NOTE — Plan of Care (Signed)
  Problem: Education: Goal: Knowledge of [facility] Education information/materials will improve Outcome: Progressing   Problem: Education: Goal: Mental status will improve Outcome: Progressing   

## 2023-11-13 NOTE — [facility] Group Notes (Signed)
[facility] Group Notes:  (Nursing/MHT/Case Management/Adjunct)  Date:  11/13/2023  Time:  2000  Type of Therapy:   Wrap up group  Participation Level:  Active  Participation Quality:  Appropriate, Attentive, Sharing, and Supportive  Affect:  Depressed and Flat  Cognitive:  Disorganized  Insight:  Limited  Engagement in Group:  Engaged  Modes of Intervention:  Clarification, Education, and Support  Summary of Progress/Problems: Positive thinking and positive change were discussed.   Marcille Buffy 11/13/2023, 10:22 PM

## 2023-11-13 NOTE — Progress Notes (Addendum)
D. Pt presented with a sad affect, depressed mood-became tearful this morning when assisting pt with changing her briefs. Pt provided with walker to assist with ambulation, and has been visible in the milieu observed attending groups. Pt currently denies SI/HI and AVH  A. Labs and vitals monitored. Pt given and educated on medications. Pt supported emotionally and encouraged to express concerns and ask questions.   R. Pt remains safe with 15 minute checks. Will continue POC.    11/13/23 1300  Psych Admission Type (Psych Patients Only)  Admission Status Voluntary  Psychosocial Assessment  Patient Complaints Depression;Crying spells  Eye Contact Fair  Facial Expression Sad  Affect Depressed  Speech Soft  Interaction Minimal  Motor Activity Slow  Appearance/Hygiene Poor hygiene  Behavior Characteristics Cooperative;Appropriate to situation  Mood Depressed;Pleasant  Thought Process  Coherency Concrete thinking  Content Preoccupation  Delusions None reported or observed  Perception WDL  Hallucination Auditory  Judgment Limited  Confusion Mild  Danger to Self  Current suicidal ideation? Denies  Danger to Others  Danger to Others None reported or observed

## 2023-11-13 NOTE — [facility] Counselor (Signed)
Adult Comprehensive Assessment  Patient ID: Allison Moore, female   DOB: 04-Feb-1981, 43 y.o.   MRN: [id number]  Information Source: Information source: Patient  Current Stressors:  Patient states their primary concerns and needs for treatment are:: Depression Patient states their goals for this hospitilization and ongoing recovery are:: To be able to use her resources to help her better social Educational / Learning stressors: none reported, patient is going to back Employment / Job issues: none reported Family Relationships: none reported Surveyor, quantity / Lack of resources (include bankruptcy): none reported Housing / Lack of housing: Patient share that she needs a place to stay Physical health (include injuries & life threatening diseases): Patient stated that she is unstable Social relationships: Patient share that she does not have that much friends Substance abuse: none reported Bereavement / Loss: none reported  Living/Environment/Situation:  Living Arrangements: Parent Living conditions (as described by patient or guardian): Patient lives with dad and boyfriends Who else lives in the home?: no one What is atmosphere in current home: Comfortable, Supportive  Family History:  Marital status: Single Are you sexually active?: No What is your sexual orientation?: she/ her Has your sexual activity been affected by drugs, alcohol, medication, or emotional stress?: none reported Does patient have children?: Yes How many children?: 1 How is patient's relationship with their children?: close  Childhood History:  By whom was/is the patient raised?: Both parents Description of patient's relationship with caregiver when they were a child: good Does patient have siblings?: Yes Number of Siblings: 4 Description of patient's current relationship with siblings: close Did patient suffer any verbal/emotional/physical/sexual abuse as a child?: No Did patient suffer from severe childhood  neglect?: No Has patient ever been sexually abused/assaulted/raped as an adolescent or adult?: No Was the patient ever a victim of a crime or a disaster?: No Witnessed domestic violence?: No Has patient been affected by domestic violence as an adult?: No  Education:  Highest grade of school patient has completed: 12 and some college Currently a Consulting civil engineer?: No Learning disability?: Yes What learning problems does patient have?: Patient stated that its going to be hard getting back into school  Employment/Work Situation:   Employment Situation: On disability Why is Patient on Disability: MS, mental health How Long has Patient Been on Disability: 7 years Patient's Job has Been Impacted by Current Illness: No What is the Longest Time Patient has Held a Job?: 1 yr Where was the Patient Employed at that Time?: [employer] Has Patient ever Been in the [employer]?: No  Financial Resources:   Surveyor, quantity resources: [employer], Medicare Does patient have a Lawyer or guardian?: Yes Name of representative payee or guardian: Patient share that she does not call her guardian  Alcohol/Substance Abuse:   What has been your use of drugs/alcohol within the last 12 months?: none reported If attempted suicide, did drugs/alcohol play a role in this?: No Alcohol/Substance Abuse Treatment Hx: Past Tx, Inpatient If yes, describe treatment: Patient share that she did not continue because of her disability Has alcohol/substance abuse ever caused legal problems?: No  Social Support System:   Forensic psychologist System: Poor Describe Community Support System: Patient share that she had her dad and her boyfriend Type of faith/religion: Ephriam Knuckles previous muslim How does patient's faith help to cope with current illness?: Patient share that she it gives her hope  Leisure/Recreation:   Do You Have Hobbies?: Yes Leisure and Hobbies: play with phone  Strengths/Needs:   What is the patient's  perception  of their strengths?: Patient share she a good listener Patient states they can use these personal strengths during their treatment to contribute to their recovery: Patient wants to listen to the resources that are given to her Patient states these barriers may affect/interfere with their treatment: Patient state that she gets depress because she can not take care of herself Patient states these barriers may affect their return to the community: Patient share that these barrier will affect her from understanding and learning to move forward  Discharge Plan:   Currently receiving community mental health services: No Patient states concerns and preferences for aftercare planning are: Patient would like to find a place to stay with resources that will help her in life Patient states they will know when they are safe and ready for discharge when: Patient does not know Does patient have access to transportation?: Yes Does patient have financial barriers related to discharge medications?: No Patient description of barriers related to discharge medications: none reported Will patient be returning to same living situation after discharge?: Yes  Summary/Recommendations:   Summary and Recommendations (to be completed by the evaluator): Allison Moore is a 43 year old female, she is on disability. Patient was seen at the [facility] and transfer to [facility] for depression. Patient share that she can live with her father, mother and/or her boyfriend but she doesn't to. Patient has a flat affect, cried when she talked about getting help from others. Client share that she would like to find a place to stay. Client share that she like to be eligible for senior home. Client share that she prefers hospice "because I don't have much to live for." (patient cried). Patient appear to confuse CSW with her personal social worker at home. Patient share that she has a guardian but does not reach out to her. Patient did not  share the name of the guardian.  Patient get teary eye when talking about her life dynamic. Patient will benefit from crisis stabilization, medication evaluation, group therapy and psychoeducation, in addition to case management for discharge planning. At discharge it is recommended that Patient adhere to the established discharge plan and continue in treatment.  Allison Moore O Joselin Crandell. 11/13/2023
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
## 2023-11-13 NOTE — Group Note (Signed)
Date:  11/13/2023 Time:  9:56 AM  Group Topic/Focus:  Goals Group:   The focus of this group is to help patients establish daily goals to achieve during treatment and discuss how the patient can incorporate goal setting into their daily lives to aide in recovery. Orientation:   The focus of this group is to educate the patient on the purpose and policies of crisis stabilization and provide a format to answer questions about their admission.  The group details unit policies and expectations of patients while admitted.    Participation Level:  Active  Participation Quality:  Drowsy  Affect:  Lethargic  Cognitive:  Lacking  Insight: Lacking  Engagement in Group:  Developing/Improving  Modes of Intervention:  Discussion  Additional Comments:    Keriann Rankin D Avneet Ashmore 11/13/2023, 9:56 AM

## 2023-11-13 NOTE — Group Note (Signed)
Date:  11/13/2023 Time:  1:37 AM  Group Topic/Focus:  Wrap-Up Group:   The focus of this group is to help patients review their daily goal of treatment and discuss progress on daily workbooks.    Participation Level:  Active  Participation Quality:  Appropriate and Sharing  Affect:  Appropriate  Cognitive:  Appropriate  Insight: Appropriate  Engagement in Group:  Engaged  Modes of Intervention:  Activity and Socialization  Additional Comments:  Patients used wrap up group sheets as guide when sharing. Patient rated her day a 6/10. Patient did not share a goal she had for today. Patient shared coping skills that she finds most helpful is "speaking with group and also being able to get back on medication". Patient shared that something that she likes about herself is "that she is a special person and she is not retarded".  Patient participated in group activity after sharing.   Kennieth Francois 11/13/2023, 1:37 AM

## 2023-11-14 ENCOUNTER — Encounter (HOSPITAL_COMMUNITY): Payer: Self-pay

## 2023-11-14 DIAGNOSIS — F251 Schizoaffective disorder, depressive type: Secondary | ICD-10-CM | POA: Diagnosis not present

## 2023-11-14 MED ORDER — RISPERIDONE 2 MG PO TBDP
2.0000 mg | ORAL_TABLET | Freq: Two times a day (BID) | ORAL | Status: DC
  Administered 2023-11-15 – 2023-11-17 (×5): 2 mg via ORAL
  Filled 2023-11-14 (×7): qty 1

## 2023-11-14 NOTE — Plan of Care (Signed)
Nurse encouraged patient to complete self inventory sheet today.

## 2023-11-14 NOTE — BH IP Treatment Plan (Signed)
Interdisciplinary Treatment and Diagnostic Plan Update  11/14/2023 Time of Session: 10:55AM Allison Moore MRN: [id number]  Principal Diagnosis: Schizoaffective disorder, depressive type (HCC)  Secondary Diagnoses: Principal Problem:   Schizoaffective disorder, depressive type (HCC) Active Problems:   History of tobacco abuse   Bipolar 1 disorder (HCC)   Current Medications:  Current Facility-Administered Medications  Medication Dose Route Frequency Provider Last Rate Last Admin   alum & mag hydroxide-simeth (MAALOX/MYLANTA) 200-200-20 MG/5ML suspension 30 mL  30 mL Oral Q4H PRN Motley-Mangrum, Jadeka A, PMHNP       haloperidol (HALDOL) tablet 5 mg  5 mg Oral TID PRN Motley-Mangrum, Jadeka A, PMHNP       And   diphenhydrAMINE (BENADRYL) capsule 50 mg  50 mg Oral TID PRN Motley-Mangrum, Jadeka A, PMHNP       haloperidol lactate (HALDOL) injection 5 mg  5 mg Intramuscular TID PRN Motley-Mangrum, Jadeka A, PMHNP       And   diphenhydrAMINE (BENADRYL) injection 50 mg  50 mg Intramuscular TID PRN Motley-Mangrum, Jadeka A, PMHNP       And   LORazepam (ATIVAN) injection 2 mg  2 mg Intramuscular TID PRN Motley-Mangrum, Jadeka A, PMHNP       haloperidol lactate (HALDOL) injection 10 mg  10 mg Intramuscular TID PRN Motley-Mangrum, Jadeka A, PMHNP       And   diphenhydrAMINE (BENADRYL) injection 50 mg  50 mg Intramuscular TID PRN Motley-Mangrum, Jadeka A, PMHNP       And   LORazepam (ATIVAN) injection 2 mg  2 mg Intramuscular TID PRN Motley-Mangrum, Jadeka A, PMHNP       FLUoxetine (PROZAC) capsule 20 mg  20 mg Oral Daily Motley-Mangrum, Jadeka A, PMHNP   20 mg at 11/14/23 0742   gabapentin (NEURONTIN) capsule 300 mg  300 mg Oral TID Motley-Mangrum, Jadeka A, PMHNP   300 mg at 11/14/23 1300   hydrOXYzine (ATARAX) tablet 25 mg  25 mg Oral TID PRN Motley-Mangrum, Jadeka A, PMHNP   25 mg at 11/13/23 2103   ibuprofen (ADVIL) tablet 600 mg  600 mg Oral Q6H PRN Bobbitt, Shalon E, NP   600 mg at  11/13/23 1818   magnesium hydroxide (MILK OF MAGNESIA) suspension 30 mL  30 mL Oral Daily PRN Motley-Mangrum, Jadeka A, PMHNP   30 mL at 11/12/23 1640   nicotine (NICODERM CQ - dosed in mg/24 hours) patch 21 mg  21 mg Transdermal Daily Massengill, Nathan, MD   21 mg at 11/14/23 0746   risperiDONE (RISPERDAL M-TABS) disintegrating tablet 1 mg  1 mg Oral QHS Motley-Mangrum, Jadeka A, PMHNP   1 mg at 11/13/23 2103   traZODone (DESYREL) tablet 50 mg  50 mg Oral QHS PRN Motley-Mangrum, Jadeka A, PMHNP   50 mg at 11/13/23 2103   PTA Medications: Medications Prior to Admission  Medication Sig Dispense Refill Last Dose/Taking   albuterol (VENTOLIN HFA) 108 (90 Base) MCG/ACT inhaler Inhale 2 puffs into the lungs every 6 (six) hours as needed for shortness of breath or wheezing.   Past Week   atorvastatin (LIPITOR) 20 MG tablet Take 20 mg by mouth at bedtime.   Taking   gabapentin (NEURONTIN) 300 MG capsule Take 1 capsule (300 mg total) by mouth 3 (three) times daily. 90 capsule 3 Taking    Patient Stressors:    Patient Strengths:    Treatment Modalities: Medication Management, Group therapy, Case management,  1 to 1 session with clinician, Psychoeducation, Recreational therapy.   Physician Treatment  Plan for Primary Diagnosis: Schizoaffective disorder, depressive type (HCC) Long Term Goal(s): Improvement in symptoms so as ready for discharge   Short Term Goals: Compliance with prescribed medications will improve  Medication Management: Evaluate patient's response, side effects, and tolerance of medication regimen.  Therapeutic Interventions: 1 to 1 sessions, Unit Group sessions and Medication administration.  Evaluation of Outcomes: Not Progressing  Physician Treatment Plan for Secondary Diagnosis: Principal Problem:   Schizoaffective disorder, depressive type (HCC) Active Problems:   History of tobacco abuse   Bipolar 1 disorder (HCC)  Long Term Goal(s): Improvement in symptoms so as  ready for discharge   Short Term Goals: Compliance with prescribed medications will improve     Medication Management: Evaluate patient's response, side effects, and tolerance of medication regimen.  Therapeutic Interventions: 1 to 1 sessions, Unit Group sessions and Medication administration.  Evaluation of Outcomes: Not Progressing   RN Treatment Plan for Primary Diagnosis: Schizoaffective disorder, depressive type (HCC) Long Term Goal(s): Knowledge of disease and therapeutic regimen to maintain health will improve  Short Term Goals: Ability to remain free from injury will improve, Ability to verbalize frustration and anger appropriately will improve, Ability to demonstrate self-control, Ability to participate in decision making will improve, Ability to verbalize feelings will improve, Ability to disclose and discuss suicidal ideas, Ability to identify and develop effective coping behaviors will improve, and Compliance with prescribed medications will improve  Medication Management: RN will administer medications as ordered by provider, will assess and evaluate patient's response and provide education to patient for prescribed medication. RN will report any adverse and/or side effects to prescribing provider.  Therapeutic Interventions: 1 on 1 counseling sessions, Psychoeducation, Medication administration, Evaluate responses to treatment, Monitor vital signs and CBGs as ordered, Perform/monitor CIWA, COWS, AIMS and Fall Risk screenings as ordered, Perform wound care treatments as ordered.  Evaluation of Outcomes: Not Progressing   LCSW Treatment Plan for Primary Diagnosis: Schizoaffective disorder, depressive type (HCC) Long Term Goal(s): Safe transition to appropriate next level of care at discharge, Engage patient in therapeutic group addressing interpersonal concerns.  Short Term Goals: Engage patient in aftercare planning with referrals and resources, Increase social support,  Increase ability to appropriately verbalize feelings, Increase emotional regulation, Facilitate acceptance of mental health diagnosis and concerns, Facilitate patient progression through stages of change regarding substance use diagnoses and concerns, Identify triggers associated with mental health/substance abuse issues, and Increase skills for wellness and recovery  Therapeutic Interventions: Assess for all discharge needs, 1 to 1 time with Social worker, Explore available resources and support systems, Assess for adequacy in community support network, Educate family and significant other(s) on suicide prevention, Complete Psychosocial Assessment, Interpersonal group therapy.  Evaluation of Outcomes: Not Progressing   Progress in Treatment: Attending groups: Yes. Participating in groups: Yes. Taking medication as prescribed: Yes. Toleration medication: Yes. Family/Significant other contact made: No, will contact:  Olin Hauser (mom) [phone] Patient understands diagnosis: Yes. Discussing patient identified problems/goals with staff: Yes. Medical problems stabilized or resolved: Yes. Denies suicidal/homicidal ideation: Yes. Issues/concerns per patient self-inventory: No.  New problem(s) identified: No, Describe:  none  New Short Term/Long Term Goal(s): medication stabilization, elimination of SI thoughts, development of comprehensive mental wellness plan.    Patient Goals:  "Medication management and coping skills I guess but I need a group home"  Discharge Plan or Barriers: Patient recently admitted. CSW will continue to follow and assess for appropriate referrals and possible discharge planning.    Reason for Continuation of Hospitalization: Depression  Medication stabilization Suicidal ideation  Estimated Length of Stay: 5-7 days  Last 3 [place] Suicide Severity Risk Score: Flowsheet Row Ad[facility] (Current) [facility]  from 11/10/2023 in [facility] Department at [facility] ED from 09/09/2023 in [facility] Emergency Department at [facility]  C-SSRS RISK CATEGORY No Risk No Risk No Risk       Last PHQ 2/9 Scores:    09/22/2021    8:59 AM 06/22/2021    2:14 PM 03/19/2021    8:48 AM  Depression screen PHQ 2/9  Decreased Interest 0 0 0  Down, Depressed, Hopeless 0 0 0  PHQ - 2 Score 0 0 0  Altered sleeping 0 0 0  Tired, decreased energy 0 0 0  Change in appetite 0 0 0  Feeling bad or failure about yourself  0 0 0  Trouble concentrating 0 0 0  Moving slowly or fidgety/restless 0 0 0  Suicidal thoughts 0 0 0  PHQ-9 Score 0 0 0  Difficult doing work/chores  Not difficult at all     Scribe for Treatment Team: [name], LCSWA 11/14/2023 1:45 PM

## 2023-11-14 NOTE — Group Note (Signed)
Occupational Therapy Group Note  Group Topic: Sleep Hygiene  Group Date: 11/14/2023 Start Time: 1430 End Time: 1500 Facilitators: Ted Mcalpine, OT   Group Description: Group encouraged increased participation and engagement through topic focused on sleep hygiene. Patients reflected on the quality of sleep they typically receive and identified areas that need improvement. Group was given background information on sleep and sleep hygiene, including common sleep disorders. Group members also received information on how to improve one's sleep and introduced a sleep diary as a tool that can be utilized to track sleep quality over a length of time. Group session ended with patients identifying one or more strategies they could utilize or implement into their sleep routine in order to improve overall sleep quality.        Therapeutic Goal(s):  Identify one or more strategies to improve overall sleep hygiene  Identify one or more areas of sleep that are negatively impacted (sleep too much, too little, etc)     Participation Level: Engaged   Participation Quality: Independent   Behavior: Appropriate   Speech/Thought Process: Barely audible   Affect/Mood: Flat   Insight: Limited   Judgement: Limited      Modes of Intervention: Education  Patient Response to Interventions:  Attentive   Plan: Continue to engage patient in OT groups 2 - 3x/week.  11/14/2023  Ted Mcalpine, OT   Kerrin Champagne, OT

## 2023-11-14 NOTE — Progress Notes (Signed)
   11/14/23 0541  15 Minute Checks  Location Bedroom  Visual Appearance Calm  Behavior Composed  Sleep (Behavioral Health Patients Only)  Calculate sleep? (Click Yes once per 24 hr at 0600 safety check) Yes  Documented sleep last 24 hours 7.75

## 2023-11-14 NOTE — Group Note (Signed)
Recreation Therapy Group Note   Group Topic:Communication  Group Date: 11/14/2023 Start Time: 0933 End Time: 0955 Facilitators: Cortez Steelman-McCall, LRT,CTRS Location: [address] Dayroom   Group Topic: Communication, Problem Solving   Goal Area(s) Addresses:  Patient will effectively listen to complete activity.  Patient will identify communication skills used to make activity successful.  Patient will identify how skills used during activity can be used to reach post d/c goals.    Intervention: Building surveyor Activity - Geometric pattern cards, pencils, blank paper    Activity: Geometric Drawings.  Three volunteers from the peer group will be shown an abstract picture with a particular arrangement of geometrical shapes.  Each round, one 'speaker' will describe the pattern, as accurately as possible without revealing the image to the group.  The remaining group members will listen and draw the picture to reflect how it is described to them. Patients with the role of 'listener' cannot ask clarifying questions but, may request that the speaker repeat a direction. Once the drawings are complete, the presenter will show the rest of the group the picture and compare how close each person came to drawing the picture. LRT will facilitate a post-activity discussion regarding effective communication and the importance of planning, listening, and asking for clarification in daily interactions with others.  Education: Environmental consultant, Active listening, Support systems, Discharge planning  Education Outcome: Acknowledges understanding/In group clarification offered/Needs additional education.    Affect/Mood: Appropriate   Participation Level: Moderate   Participation Quality: Independent   Behavior: Attentive    Speech/Thought Process: Relevant   Insight: Fair   Judgement: Fair    Modes of Intervention: Activity   Patient Response to Interventions:  Attentive   Education  Outcome:  In group clarification offered    Clinical Observations/Individualized Feedback: Pt was quiet but attempted some of the drawings. Pt was more attentive during group than actually doing the drawings.    Plan: Continue to engage patient in RT group sessions 2-3x/week.   Emelee Rodocker-McCall, LRT,CTRS  11/14/2023 12:23 PM

## 2023-11-14 NOTE — Progress Notes (Signed)
D:  Patient stated she feels suicidal at time, but would not do anything in the hospital, contracts for safety.  Denied HI.  Stated she does "see myself in group home, voices tell me to take medicines."  A:  Medications administered per MD orders.  Emotional support and encouragement given patient. R:  Safety maintained with 15 minute checks. Patient has been ambulating with walker, walks slowly.  Walked to dining room this morning to breakfast.
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
## 2023-11-14 NOTE — Plan of Care (Signed)
  Problem: Education: Goal: Verbalization of understanding the information provided will improve Outcome: Progressing   Problem: Activity: Goal: Interest or engagement in activities will improve Outcome: Progressing   

## 2023-11-14 NOTE — [facility] Group Notes (Signed)
The focus of this group is to help patients establish daily goals to achieve during treatment and discuss how the patient can incorporate goal setting into their daily lives to aide in recovery.    Scale 1-10  6 out of 10   Goal: get some rest

## 2023-11-14 NOTE — [facility] Group Notes (Signed)
Adult Psychoeducational Group Note  Date:  11/14/2023 Time:  9:30 PM  Group Topic/Focus:  Wrap-Up Group:   The focus of this group is to help patients review their daily goal of treatment and discuss progress on daily workbooks.  Participation Level:  Active  Participation Quality:  Attentive  Affect:  Appropriate  Cognitive:  Alert  Insight: Appropriate  Engagement in Group:  Engaged  Modes of Intervention:  Discussion  Additional Comments:  Pt attended and participated in AA group.  Maura Crandall Cassandra 11/14/2023, 9:30 PM

## 2023-11-14 NOTE — Therapy (Signed)
OT met w/ pt s/p group Tx session. OT noted pt does present w/ what appears to be a recent change from baseline as it relates to basic mobility. OT notes instances of small amplitude lateral sway during ambulation. Pt states she has sustained a number of falls over the past 6 months however she was unable to provide an exact number. Pt states her most recent fall was on the day of her admission, prior to coming into [facility]. Pt was unable to provide any specific insights regarding the nature of this fall. Pt is intermittently labile t/o this screening session. OT has relayed this information to PT for further assessment. Thank you for this consult. Please re-consult OT as needed.   Kerrin Champagne, OT

## 2023-11-15 DIAGNOSIS — F251 Schizoaffective disorder, depressive type: Secondary | ICD-10-CM | POA: Diagnosis not present

## 2023-11-15 LAB — HIV ANTIBODY (ROUTINE TESTING W REFLEX): HIV Screen 4th Generation wRfx: NONREACTIVE

## 2023-11-15 LAB — VITAMIN D 25 HYDROXY (VIT D DEFICIENCY, FRACTURES): Vit D, 25-Hydroxy: 18.48 ng/mL — ABNORMAL LOW (ref 30–100)

## 2023-11-15 LAB — LIPID PANEL
Cholesterol: 176 mg/dL (ref 0–200)
HDL: 50 mg/dL (ref >40)
LDL Cholesterol: 114 mg/dL — ABNORMAL HIGH (ref 0–99)
Total CHOL/HDL Ratio: 3.5 {ratio}
Triglycerides: 62 mg/dL (ref <150)
VLDL: 12 mg/dL (ref 0–40)

## 2023-11-15 LAB — FOLATE: Folate: 9.6 ng/mL (ref >5.9)

## 2023-11-15 LAB — VITAMIN B12: Vitamin B-12: 362 pg/mL (ref 180–914)

## 2023-11-15 LAB — HEMOGLOBIN A1C
Hgb A1c MFr Bld: 5.1 % (ref 4.8–5.6)
Mean Plasma Glucose: 99.67 mg/dL

## 2023-11-15 LAB — RPR: RPR Ser Ql: NONREACTIVE

## 2023-11-15 MED ORDER — VITAMIN D3 25 MCG PO TABS
2000.0000 [IU] | ORAL_TABLET | Freq: Every day | ORAL | Status: DC
  Administered 2023-11-16 – 2023-11-25 (×10): 2000 [IU] via ORAL
  Filled 2023-11-15 (×12): qty 2

## 2023-11-15 MED ORDER — VITAMIN D (ERGOCALCIFEROL) 1.25 MG (50000 UNIT) PO CAPS
50000.0000 [IU] | ORAL_CAPSULE | Freq: Once | ORAL | Status: AC
  Administered 2023-11-15: 50000 [IU] via ORAL
  Filled 2023-11-15 (×2): qty 1

## 2023-11-15 NOTE — Plan of Care (Signed)
  Problem: Education: Goal: Emotional status will improve Outcome: Progressing Goal: Mental status will improve Outcome: Progressing   Problem: Activity: Goal: Interest or engagement in activities will improve Outcome: Progressing Goal: Sleeping patterns will improve Outcome: Progressing

## 2023-11-15 NOTE — Progress Notes (Signed)
   11/14/23 2028  Psych Admission Type (Psych Patients Only)  Admission Status Voluntary  Psychosocial Assessment  Patient Complaints Depression;Anxiety;Sadness  Eye Contact Fair  Facial Expression Flat  Affect Depressed  Speech Slow;Soft  Interaction Cautious  Motor Activity Slow  Appearance/Hygiene Disheveled  Behavior Characteristics Cooperative  Mood Depressed;Sad;Anxious  Thought Process  Coherency Tangential  Content Preoccupation  Delusions None reported or observed  Perception Hallucinations  Hallucination Auditory  Judgment Limited  Confusion Mild  Danger to Self  Current suicidal ideation? Passive  Self-Injurious Behavior No self-injurious ideation or behavior indicators observed or expressed   Agreement Not to Harm Self Yes  Description of Agreement verbal  Danger to Others  Danger to Others None reported or observed

## 2023-11-15 NOTE — [facility] Group Notes (Signed)
[facility] Group Notes:  (Nursing/MHT/Case Management/Adjunct)  Date:  11/15/2023  Time:  9:08 PM  Type of Therapy:   Wrap-up group  Participation Level:  Did Not Attend  Participation Quality:    Affect:    Cognitive:    Insight:    Engagement in Group:    Modes of Intervention:    Summary of Progress/Problems: Pt refused to attend group. Writer provided Pt with a list of coping skills worksheet.   Noah Delaine 11/15/2023, 9:08 PM

## 2023-11-15 NOTE — Progress Notes (Addendum)
Pt endorsed passive SI this morning with no plan or intent. Pt endorsed AVH reporting "I see and hear my family". Pt rated her depression a 9/10, anxiety a 9/10. Pt complains of a headache and lower back pain rating it a 7/10, PRN Tylenol administered per MAR. Pt was very tearful this morning continuously stating "I just want to feel better". RN provided support and encouragement to patient. Pt given scheduled medications as prescribed. Q15 min checks verified for safety. Patient verbally contracts for safety. Patient compliant with medications and treatment plan. Pt is safe on the unit.   11/15/23 0900  Psych Admission Type (Psych Patients Only)  Admission Status Voluntary  Psychosocial Assessment  Patient Complaints Anxiety;Depression (Rate rates anxiety and depression as "high")  Eye Contact Fair  Facial Expression Anxious;Trembling lip;Sad  Affect Depressed;Anxious  Speech Slow;Soft  Interaction Cautious;Minimal  Motor Activity Slow;Other (Comment);Unsteady (hx of MS)  Appearance/Hygiene Disheveled  Behavior Characteristics Cooperative;Anxious  Mood Depressed;Anxious;Sad  Thought Ship broker  Content Preoccupation (Preoccupied reporting "I am never going to get better")  Delusions None reported or observed  Perception Hallucinations  Hallucination Auditory;Visual ("I see my family members")  Judgment Limited  Confusion None  Danger to Self  Current suicidal ideation? Passive  Self-Injurious Behavior Some self-injurious ideation observed or expressed.  No lethal plan expressed   Agreement Not to Harm Self Yes  Description of Agreement Pt verbally contracts for safety  Danger to Others  Danger to Others None reported or observed

## 2023-11-15 NOTE — [facility] Suicide Risk Assessment (Signed)
[facility] INPATIENT:  Family/Significant Other Suicide Prevention Education  Suicide Prevention Education:  Education Completed; Allison Moore (mom) [phone],  (name of family member/significant other) has been identified by the patient as the family member/significant other with whom the patient will be residing, and identified as the person(s) who will aid the patient in the event of a mental health crisis (suicidal ideations/suicide attempt).  With written consent from the patient, the family member/significant other has been provided the following suicide prevention education, prior to the and/or following the discharge of the patient.  Mom reports pt was living in a hotel before admission to the hospital. Pt has had unstable housing for 2 years. Mom is local to [facility] and keeps in contact with pt often. Pt has been experiencing mental health issues for "many years" however, this decline of pt's mental health has been going on for 4-5 mo. Pt's mom reports a group home is the best place for pt to discharge to. Pt is on disability and receives a check for her son as well. Mom has concerns if pt were to be residing alone including her safety & people taking advantage of her. Pt has been incontinent for "years" but it has gotten worse in the past 4-5 months. Pt is unable to stay with mom at discharge, mom is taking care of pt's son and is unable to care for pt too.    The suicide prevention education provided includes the following: Suicide risk factors Suicide prevention and interventions National Suicide Hotline telephone number [facility] assessment telephone number [facility] Emergency Assistance 911 [facility] and/or Residential Mobile Crisis Unit telephone number  Request made of family/significant other to: Remove weapons (e.g., guns, rifles, knives), all items previously/currently identified as safety concern.   Remove drugs/medications (over-the-counter,  prescriptions, illicit drugs), all items previously/currently identified as a safety concern.  The family member/significant other verbalizes understanding of the suicide prevention education information provided.  The family member/significant other agrees to remove the items of safety concern listed above.  [name] 11/15/2023, 3:52 PM
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
## 2023-11-15 NOTE — Group Note (Signed)
Recreation Therapy Group Note   Group Topic:Animal Assisted Therapy   Group Date: 11/15/2023 Start Time: 4098 End Time: 1030 Facilitators: Jimmylee Ratterree-McCall, LRT,CTRS Location: [address] Dayroom   Animal-Assisted Activity (AAA) Program Checklist/Progress Notes Patient Eligibility Criteria Checklist & Daily Group note for Rec Tx Intervention  AAA/T Program Assumption of Risk Form signed by Patient/ or Parent Legal Guardian Yes  Patient understands his/her participation is voluntary Yes  Education: Charity fundraiser, Appropriate Animal Interaction   Education Outcome: Acknowledges education.    Affect/Mood: N/A   Participation Level: Did not attend    Clinical Observations/Individualized Feedback:     Plan: Continue to engage patient in RT group sessions 2-3x/week.   Allison Moore, LRT,CTRS 11/15/2023 12:42 PM

## 2023-11-15 NOTE — Group Note (Signed)
LCSW Group Therapy Note   Group Date: 11/15/2023 Start Time: 1100 End Time: 1200   Type of Therapy and Topic:  Group Therapy  Topic:  Shining from Within:  Confidence and Self-Love Journey  Participation:  patient was present and actively participated in the discussion.  Objective: Promote Self-Awareness and Realistic Self-Talk: Help participants recognize their strengths and replace negative thoughts with truthful, realistic statements to build confidence.  Goals: Increase Confidence: Help participants develop a positive self-image by focusing on their strengths and personal progress. Set Achievable Goals: Guide participants in creating small, realistic goals that foster a sense of accomplishment and build momentum. Enhance Self-Care Practices: Encourage participants to incorporate self-care activities into their routine to support emotional well-being and reinforce confidence.  Summary: The "Shining from Within: Confidence and Self-Love Journey" group helped individuals build stronger self-confidence through truthful, realistic self-talk, goal-setting, and self-care practices. Participants recognized their unique strengths, set achievable goals, and nurtured a supportive environment for mutual growth. The group provided practical tools to foster lasting confidence and self-belief, empowering each member to shine from within and embrace their personal power with greater self-assurance.  Therapeutic Modalities: Elements of CBT Elements of DBT   Alla Feeling, LCSWA 11/15/2023  6:10 PM

## 2023-11-16 DIAGNOSIS — F251 Schizoaffective disorder, depressive type: Secondary | ICD-10-CM | POA: Diagnosis not present

## 2023-11-16 MED ORDER — SERTRALINE HCL 50 MG PO TABS
50.0000 mg | ORAL_TABLET | Freq: Every day | ORAL | Status: DC
  Administered 2023-11-17 – 2023-11-22 (×6): 50 mg via ORAL
  Filled 2023-11-16 (×8): qty 1

## 2023-11-16 NOTE — Plan of Care (Signed)
  Problem: Acute Rehab OT Goals (only OT should resolve) Goal: Pt. Will Perform Upper Body Dressing w/ MOD I Outcome: Progressing Goal: Pt. Will Perform Lower Body Dressing w/ MOD I  Outcome: Progressing Goal: Pt. Will Transfer To Toilet w/ MOD I  Outcome: Progressing    Kerrin Champagne, OT

## 2023-11-16 NOTE — Group Note (Signed)
Date:  11/16/2023 Time:  4:50 PM  Group Topic/Focus:  Dimensions of Wellness:   The focus of this group is to introduce the topic of wellness and discuss the role each dimension of wellness plays in total health.    Participation Level:  Did Not Attend  Participation Quality:   n/a  Affect:   n/a  Cognitive:   n/a  Insight: None  Engagement in Group:   n/a  Modes of Intervention:   n/a  Additional Comments:   Pt did not attend.  Edmund Hilda Shenekia Riess 11/16/2023, 4:50 PM

## 2023-11-16 NOTE — Plan of Care (Signed)
  Problem: Activity: Goal: Sleeping patterns will improve Outcome: Progressing   Problem: Safety: Goal: Periods of time without injury will increase Outcome: Progressing   

## 2023-11-16 NOTE — Progress Notes (Signed)
   11/16/23 1600  Psych Admission Type (Psych Patients Only)  Admission Status Voluntary  Psychosocial Assessment  Patient Complaints None  Eye Contact Brief  Facial Expression Flat  Affect Depressed  Speech Soft;Slow  Interaction Cautious;Needy  Motor Activity Slow;Unsteady  Appearance/Hygiene Disheveled  Behavior Characteristics Cooperative  Mood Depressed  Thought Process  Coherency Blocking;Tangential  Content UTA  Delusions None reported or observed  Perception UTA  Hallucination None reported or observed  Judgment Limited  Confusion Mild  Danger to Self  Current suicidal ideation? Denies  Agreement Not to Harm Self Yes  Description of Agreement verbal  Danger to Others  Danger to Others None reported or observed

## 2023-11-16 NOTE — Plan of Care (Signed)
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
## 2023-11-16 NOTE — [facility] Group Notes (Signed)
[facility] Group Notes:  (Nursing/MHT/Case Management/Adjunct)  Date:  11/16/2023  Time:  9:17 PM  Type of Therapy:   NA/wrap-up group  Participation Level:  Did Not Attend  Participation Quality:    Affect:    Cognitive:    Insight:    Engagement in Group:    Modes of Intervention:    Summary of Progress/Problems: Pt refused to attend all groups offered. Writer provided pt with worksheet on box breathing.  Noah Delaine 11/16/2023, 9:17 PM

## 2023-11-16 NOTE — Group Note (Signed)
Date:  11/16/2023 Time:  10:47 AM  Group Topic/Focus:  Goals Group:   The focus of this group is to help patients establish daily goals to achieve during treatment and discuss how the patient can incorporate goal setting into their daily lives to aide in recovery. Orientation:   The focus of this group is to educate the patient on the purpose and policies of crisis stabilization and provide a format to answer questions about their admission.  The group details unit policies and expectations of patients while admitted.    Participation Level:  Did not attend.  Participation Quality:   n/a  Affect:   n/a  Cognitive:   n/a  Insight: None  Engagement in Group:   n/a  Modes of Intervention:   n/a  Additional Comments:   Pt did not attend.   Edmund Hilda Leticia Mcdiarmid 11/16/2023, 10:47 AM

## 2023-11-16 NOTE — Progress Notes (Signed)
   11/16/23 0553  15 Minute Checks  Location Bedroom  Visual Appearance Calm  Behavior Sleeping  Sleep (Behavioral Health Patients Only)  Calculate sleep? (Click Yes once per 24 hr at 0600 safety check) Yes  Documented sleep last 24 hours 9.75

## 2023-11-16 NOTE — Plan of Care (Signed)
  Problem: Safety: Goal: Periods of time without injury will increase Outcome: Progressing   

## 2023-11-16 NOTE — Progress Notes (Signed)
   11/15/23 2150  Psych Admission Type (Psych Patients Only)  Admission Status Voluntary  Psychosocial Assessment  Patient Complaints None  Eye Contact Brief  Facial Expression Flat  Affect Depressed  Speech UTA  Interaction Isolative;Minimal  Motor Activity Slow;Unsteady  Appearance/Hygiene Disheveled  Behavior Characteristics Unwilling to participate  Mood Depressed  Thought Process  Coherency Unable to assess  Content UTA  Delusions UTA  Perception UTA  Hallucination UTA  Judgment UTA  Confusion UTA  Danger to Self  Current suicidal ideation? Denies  Agreement Not to Harm Self Yes  Description of Agreement verbal  Danger to Others  Danger to Others None reported or observed   Pt spent most of shift asleep in bedroom. Pt would briefly respond during asssessment and would go back to sleep.

## 2023-11-16 NOTE — Group Note (Signed)
Recreation Therapy Group Note   Group Topic:Other  Group Date: 11/16/2023 Start Time: 1405 End Time: 1445 Facilitators: Lianette Broussard-McCall, LRT,CTRS Location: [address] Dayroom   Activity Description/Intervention: Therapeutic Drumming. Patients with peers and staff were given the opportunity to engage in a leader facilitated [facility] Empowerment Drumming Circle with staff from the [employer], in partnership with [employer]. Teaching laboratory technician and trained [employer], Theodoro Doing leading with LRT observing and documenting intervention and pt response. This evidenced-based practice targets 7 areas of health and wellbeing in the human experience including: stress-reduction, exercise, self-expression, camaraderie/support, nurturing, spirituality, and music-making (leisure).   Goal Area(s) Addresses:  Patient will engage in pro-social way in music group.  Patient will follow directions of drum leader on the first prompt. Patient will demonstrate no behavioral issues during group.  Patient will identify if a reduction in stress level occurs as a result of participation in therapeutic drum circle.    Education: Leisure exposure, Pharmacologist, Musical expression, Discharge Planning   Affect/Mood: N/A   Participation Level: Did not attend    Clinical Observations/Individualized Feedback:     Plan: Continue to engage patient in RT group sessions 2-3x/week.   Yasuko Lapage-McCall, LRT,CTRS 11/16/2023 3:16 PM
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
## 2023-11-17 ENCOUNTER — Inpatient Hospital Stay (HOSPITAL_COMMUNITY): Payer: 59

## 2023-11-17 DIAGNOSIS — F251 Schizoaffective disorder, depressive type: Secondary | ICD-10-CM | POA: Diagnosis not present

## 2023-11-17 MED ORDER — RISPERIDONE 3 MG PO TABS
3.0000 mg | ORAL_TABLET | Freq: Every day | ORAL | Status: DC
  Administered 2023-11-19 – 2023-11-24 (×7): 3 mg via ORAL
  Filled 2023-11-17 (×8): qty 1
  Filled 2023-11-17: qty 3
  Filled 2023-11-17: qty 1

## 2023-11-17 NOTE — Group Note (Signed)
[facility] LCSW Group Therapy Note   Group Date: 11/17/2023 Start Time: 1100 End Time: 1200   Type of Therapy/Topic:  Group Therapy:  Emotion Regulation  Participation Level:  Active   Mood:  Description of Group:    The purpose of this group is to assist patients in learning to regulate negative emotions and experience positive emotions. Patients will be guided to discuss ways in which they have been vulnerable to their negative emotions. These vulnerabilities will be juxtaposed with experiences of positive emotions or situations, and patients challenged to use positive emotions to combat negative ones. Special emphasis will be placed on coping with negative emotions in conflict situations, and patients will process healthy conflict resolution skills.  Therapeutic Goals: Patient will identify two positive emotions or experiences to reflect on in order to balance out negative emotions:  Patient will label two or more emotions that they find the most difficult to experience:  Patient will be able to demonstrate positive conflict resolution skills through discussion or role plays:   Summary of Patient Progress:   During group pt was engaged and attentive to discussion. Pt was vulnerable in sharing her perspective on the activity hand out and very respectful to others. Pt was able to identify any fears and emotions that are impacting her.     Therapeutic Modalities:   Cognitive Behavioral Therapy Feelings Identification Dialectical Behavioral Therapy   Steffanie Dunn, LCSW

## 2023-11-17 NOTE — Plan of Care (Signed)
  Problem: Education: Goal: Emotional status will improve Outcome: Progressing Goal: Mental status will improve Outcome: Progressing   Problem: Activity: Goal: Interest or engagement in activities will improve Outcome: Progressing Goal: Sleeping patterns will improve Outcome: Progressing   Problem: Safety: Goal: Periods of time without injury will increase Outcome: Progressing   

## 2023-11-17 NOTE — Group Note (Signed)
Date:  11/17/2023 Time:  10:11 AM  Group Topic/Focus:  Goals Group:   The focus of this group is to help patients establish daily goals to achieve during treatment and discuss how the patient can incorporate goal setting into their daily lives to aide in recovery. Orientation:   The focus of this group is to educate the patient on the purpose and policies of crisis stabilization and provide a format to answer questions about their admission.  The group details unit policies and expectations of patients while admitted.    Participation Level:  Did Not Attend  Additional Comments:  Patient was encouraged to attend group multiple times.   Tishara Pizano T Lorraine Lax 11/17/2023, 10:11 AM

## 2023-11-17 NOTE — [facility] Group Notes (Signed)
Spiritual care group on grief and loss facilitated by Chaplain Dyanne Carrel, [facility]  Group Goal: Support / Education around grief and loss  Members engage in facilitated group support and psycho-social education.  Group Description:  Following introductions and group rules, group members engaged in facilitated group dialogue and support around topic of loss, with particular support around experiences of loss in their lives. Group Identified types of loss (relationships / self / things) and identified patterns, circumstances, and changes that precipitate losses. Reflected on thoughts / feelings around loss, normalized grief responses, and recognized variety in grief experience. Group encouraged individual reflection on safe space and on the coping skills that they are already utilizing.  Group drew on Adlerian / Rogerian and narrative framework  Patient Progress: Allison Moore attended group.  Though verbal participation was limited, she demonstrated engagement in the group conversation.

## 2023-11-17 NOTE — Progress Notes (Signed)
   11/17/23 1500  Psych Admission Type (Psych Patients Only)  Admission Status Voluntary  Psychosocial Assessment  Patient Complaints Depression  Eye Contact Brief  Facial Expression Flat  Affect Depressed  Speech Soft;Slow  Interaction Cautious;Needy  Motor Activity Slow;Unsteady  Appearance/Hygiene Disheveled  Behavior Characteristics Cooperative  Mood Depressed  Thought Process  Coherency Blocking;Tangential  Content UTA  Delusions None reported or observed  Perception UTA  Hallucination None reported or observed  Judgment Limited  Confusion Mild  Danger to Self  Current suicidal ideation? Denies  Agreement Not to Harm Self Yes  Description of Agreement verbal  Danger to Others  Danger to Others None reported or observed

## 2023-11-17 NOTE — [place] FL2 (Addendum)
[place] MEDICAID FL2 LEVEL OF CARE FORM     IDENTIFICATION  Patient Name: Allison Moore Birthdate: [birth year] Sex: female Admission Date (Current Location): 11/11/2023  [facility] and [place] Number:   [facility] Medicare dual complete. Subscriber #[id number]   Facility and Address:   [facility] [address], [place], [place]      Provider Number:    Attending Physician Name and Address:  Charlsie Merles, MD Meryl Dare, MD  Relative Name and Phone Number:   Mitzie Na (mother) [phone]    Current Level of Care:  [facility]ospital [facility] Recommended Level of Care:  Skilled Nursing Facility or NF Prior Approval Number:    Date Approved/Denied:   PASRR Number:  [id number]  Discharge Plan:  Date contingent on SNF acceptance     Current Diagnoses: Patient Active Problem List   Diagnosis Date Noted   Bipolar 1 disorder (HCC) 11/11/2023   Cocaine abuse (HCC) 07/29/2021   Acute pain of right shoulder 03/19/2021   Missed period 09/05/2020   Right arm pain 09/05/2020   Groin pain, left 03/04/2020   Encounter for pregnancy test, result negative 09/18/2019   Generalized abdominal pain 08/03/2019   Allergic rhinitis 06/03/2016   Anxiety 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 03/05/2014   Contraception management 05/11/2013   Cannabis abuse 04/27/2013   Schizoaffective disorder, depressive type (HCC) 04/07/2010   History of tobacco abuse 10/27/2009   Bipolar disorder (HCC) 11/16/2007   OBESITY, NOS 12/22/2006   Multiple sclerosis (HCC) 12/22/2006    Orientation RESPIRATION BLADDER Height & Weight      A & O x 6   Normal  Urinary incontinence  Weight: 101 kg Height:  5\' 4"  (162.6 cm)  BEHAVIORAL SYMPTOMS/MOOD NEUROLOGICAL MS and generalized weakness BOWEL Continence  NUTRITION STATUS   None;Normal      Normal diet  AMBULATORY STATUS COMMUNICATION OF NEEDS Skin Unremarkable     Ambulatory with 2 wheel walker  Difficulty communicating  thoughts/reduced clarity of speech                         Personal Care Assistance Level of Assistance   Overall ADLs: Needs assistance/impaired, needs some assistance bathing. Reports she is able to dress, toilet however required SU x another person to initiate the task   Functional Limitations Info   Falls precaution   PT has had a recent decline in their functional status and demonstrates the ability to make significant improvements in function in a reasonable and predictable amount of time         SPECIAL CARE FACTORS FREQUENCY   OT PT Minimum 3x/week Minimum 1x/week    Contractures  None    Additional Factors Info                  Current Medications (11/17/2023):  This is the current hospital active medication list Current Facility-Administered Medications  Medication Dose Route Frequency Provider Last Rate Last Admin   alum & mag hydroxide-simeth (MAALOX/MYLANTA) 200-200-20 MG/5ML suspension 30 mL  30 mL Oral Q4H PRN Motley-Mangrum, Jadeka A, PMHNP       haloperidol (HALDOL) tablet 5 mg  5 mg Oral TID PRN Motley-Mangrum, Jadeka A, PMHNP       And   diphenhydrAMINE (BENADRYL) capsule 50 mg  50 mg Oral TID PRN Motley-Mangrum, Jadeka A, PMHNP       haloperidol lactate (HALDOL) injection 5 mg  5 mg Intramuscular TID PRN Motley-Mangrum, Jadeka A,  PMHNP       And   diphenhydrAMINE (BENADRYL) injection 50 mg  50 mg Intramuscular TID PRN Motley-Mangrum, Jadeka A, PMHNP       And   LORazepam (ATIVAN) injection 2 mg  2 mg Intramuscular TID PRN Motley-Mangrum, Jadeka A, PMHNP       haloperidol lactate (HALDOL) injection 10 mg  10 mg Intramuscular TID PRN Motley-Mangrum, Jadeka A, PMHNP       And   diphenhydrAMINE (BENADRYL) injection 50 mg  50 mg Intramuscular TID PRN Motley-Mangrum, Jadeka A, PMHNP       And   LORazepam (ATIVAN) injection 2 mg  2 mg Intramuscular TID PRN Motley-Mangrum, Jadeka A, PMHNP       gabapentin (NEURONTIN) capsule 300 mg  300 mg Oral TID  Motley-Mangrum, Jadeka A, PMHNP   300 mg at 11/17/23 0841   hydrOXYzine (ATARAX) tablet 25 mg  25 mg Oral TID PRN Motley-Mangrum, Jadeka A, PMHNP   25 mg at 11/16/23 0828   ibuprofen (ADVIL) tablet 600 mg  600 mg Oral Q6H PRN Bobbitt, Shalon E, NP   600 mg at 11/16/23 1711   magnesium hydroxide (MILK OF MAGNESIA) suspension 30 mL  30 mL Oral Daily PRN Motley-Mangrum, Jadeka A, PMHNP   30 mL at 11/14/23 2102   nicotine (NICODERM CQ - dosed in mg/24 hours) patch 21 mg  21 mg Transdermal Daily Massengill, Harrold Donath, MD   21 mg at 11/17/23 0841   [START ON 11/18/2023] risperiDONE (RISPERDAL) tablet 3 mg  3 mg Oral QHS Golda Acre, MD       sertraline (ZOLOFT) tablet 50 mg  50 mg Oral Daily Golda Acre, MD   50 mg at 11/17/23 0842   traZODone (DESYREL) tablet 50 mg  50 mg Oral QHS PRN Motley-Mangrum, Jadeka A, PMHNP   50 mg at 11/14/23 2100   vitamin D3 (CHOLECALCIFEROL) tablet 2,000 Units  2,000 Units Oral Daily Golda Acre, MD   2,000 Units at 11/17/23 6962     Discharge Medications: Please see discharge summary for a list of discharge medications.  Relevant Imaging Results:  Relevant Lab Results:   Additional Information    [name], LCSWA
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
## 2023-11-17 NOTE — Plan of Care (Signed)

## 2023-11-17 NOTE — Plan of Care (Signed)
I am asked for recommendations to evaluate for MS flare in a patient presenting with worsening depression with psychotic features and untreated MS  Recommend: MRI brain, C-spine and T-spine w/ and w/o contrast  Would prioritize MRI brain due to the symptoms described  Patient may need sedation to tolerate scans per primary team Neurology should be formally consulted if MRI is positive  Brooke Dare MD-PhD [facility] [phone] Available 7 AM to 7 PM, outside these hours please contact Neurologist on call listed on AMION

## 2023-11-17 NOTE — Progress Notes (Signed)
   11/16/23 2200  Psych Admission Type (Psych Patients Only)  Admission Status Voluntary  Psychosocial Assessment  Patient Complaints None  Eye Contact Poor  Facial Expression Flat  Affect Depressed  Speech UTA  Interaction Minimal  Motor Activity Slow  Appearance/Hygiene Disheveled  Behavior Characteristics Unwilling to participate  Mood Depressed  Thought Process  Coherency Unable to assess  Content UTA  Delusions UTA  Perception UTA  Hallucination UTA  Judgment UTA  Confusion UTA  Danger to Self  Current suicidal ideation? Denies (Denies)  Agreement Not to Harm Self Yes  Description of Agreement Verbal  Danger to Others  Danger to Others None reported or observed

## 2023-11-17 NOTE — Progress Notes (Signed)
     11/17/2023       10:28 AM   Allison Moore   Type of Note: Skilled Nursing Oncologist with MD Jimmey Ralph this morning during progression who agrees that pt would benefit from a SNF at discharge due to her MS. Spoke with pt this morning regarding skilled nursing facilities and the possibility of facility taking the 2 disability checks pt receives monthly (1 for self and 1 for her son). Pt stated "they can have it all, I just want to get help." Pt is very interested in a SNF. Pt requested to stay in [place] as this is where family resides.  [name] in [facility] was able to provide this writer information regarding pt's PASRR number as this is a requirement for the FL2 and SNF placement. Pt was screened through [place] [facility] and came back as a "Level II," meaning the state wants more information/needs comprehensive evaluation before making the decision regarding the need for a 30 day short term rehab.   Because of the Level II response, this writer completed/printed pt's FL2, H&P, progress notes since 11/14/23, and the 30 day notice which was signed by MD Gilman Buttner. This information was scanned to [place] who uploaded documents in the [facility] portal for [place] [employer]. Currently awaiting a response regarding approval for a 30 day SNF.   Contacted the following SNF's to gauge openings for female beds; results are as follows:  [facility], [phone]: Did not answer, left confidential VM  [facility], [phone]: Did not answer, left confidential VM  [facility], [phone]) [phone]: Did not answer, left confidential VM  [facility], [phone]: Bed availability changes daily; as of today, there are no openings however there are several pending discharges for this weekend. Admissions would like this writer to send referral once full FL2 is completed (with PASRR information)   Will continue to assist.   Signed:  Ayodele Hartsock, LCSW-A
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
## 2023-11-17 NOTE — BHH Group Notes (Signed)
Psychoeducational Group Note  Date:  11/17/2023 Time:  2000  Group Topic/Focus:  Wrap up group  Participation Level: Did Not Attend  Participation Quality:  Not Applicable  Affect:  Not Applicable  Cognitive:  Not Applicable  Insight:  Not Applicable  Engagement in Group: Not Applicable  Additional Comments:  Did not attend.   Marcille Buffy 11/17/2023, 8:55 PM

## 2023-11-18 ENCOUNTER — Encounter (HOSPITAL_COMMUNITY): Payer: Self-pay

## 2023-11-18 ENCOUNTER — Inpatient Hospital Stay (HOSPITAL_COMMUNITY)
Admission: AD | Admit: 2023-11-18 | Discharge: 2023-11-18 | Disposition: A | Discharge status: Home / Self Care | Payer: 59 | Source: Intra-hospital | Attending: Psychiatry | Admitting: Psychiatry

## 2023-11-18 DIAGNOSIS — F251 Schizoaffective disorder, depressive type: Secondary | ICD-10-CM | POA: Diagnosis not present

## 2023-11-18 LAB — VITAMIN B1: Vitamin B1 (Thiamine): 110 nmol/L (ref 66.5–200.0)

## 2023-11-18 MED ORDER — GADOBUTROL 1 MMOL/ML IV SOLN
10.0000 mL | Freq: Once | INTRAVENOUS | Status: AC | PRN
  Administered 2023-11-18: 10 mL via INTRAVENOUS

## 2023-11-18 NOTE — Group Note (Signed)
Date:  11/18/2023 Time:  4:58 PM  Group Topic/Focus:  Social Wellness (Self and Interpersonal)    Participation Level:  Active  Participation Quality:  Appropriate  Affect:  Appropriate  Cognitive:  Appropriate  Insight: Appropriate  Engagement in Group:  Engaged  Modes of Intervention:  Activity, Problem-solving, and Socialization  Additional Comments:   Pt attended and actively participated in the Social Wellness group. Pt practiced teamwork, used problem solving, communication, and critical thinking skills to complete the social activity.  Allison Moore 11/18/2023, 4:58 PM

## 2023-11-18 NOTE — BH IP Treatment Plan (Signed)
Interdisciplinary Treatment and Diagnostic Plan Update  11/18/2023 Time of Session: 11:10 AM - UPDATE Allison Moore MRN: [id number]  Principal Diagnosis: Schizoaffective disorder, depressive type (HCC)  Secondary Diagnoses: Principal Problem:   Schizoaffective disorder, depressive type (HCC) Active Problems:   History of tobacco abuse   Bipolar 1 disorder (HCC)   Current Medications:  Current Facility-Administered Medications  Medication Dose Route Frequency Provider Last Rate Last Admin   alum & mag hydroxide-simeth (MAALOX/MYLANTA) 200-200-20 MG/5ML suspension 30 mL  30 mL Oral Q4H PRN Motley-Mangrum, Jadeka A, PMHNP       haloperidol (HALDOL) tablet 5 mg  5 mg Oral TID PRN Motley-Mangrum, Jadeka A, PMHNP       And   diphenhydrAMINE (BENADRYL) capsule 50 mg  50 mg Oral TID PRN Motley-Mangrum, Jadeka A, PMHNP       haloperidol lactate (HALDOL) injection 5 mg  5 mg Intramuscular TID PRN Motley-Mangrum, Jadeka A, PMHNP       And   diphenhydrAMINE (BENADRYL) injection 50 mg  50 mg Intramuscular TID PRN Motley-Mangrum, Jadeka A, PMHNP       And   LORazepam (ATIVAN) injection 2 mg  2 mg Intramuscular TID PRN Motley-Mangrum, Jadeka A, PMHNP       haloperidol lactate (HALDOL) injection 10 mg  10 mg Intramuscular TID PRN Motley-Mangrum, Jadeka A, PMHNP       And   diphenhydrAMINE (BENADRYL) injection 50 mg  50 mg Intramuscular TID PRN Motley-Mangrum, Jadeka A, PMHNP       And   LORazepam (ATIVAN) injection 2 mg  2 mg Intramuscular TID PRN Motley-Mangrum, Jadeka A, PMHNP       gabapentin (NEURONTIN) capsule 300 mg  300 mg Oral TID Motley-Mangrum, Jadeka A, PMHNP   300 mg at 11/18/23 1248   hydrOXYzine (ATARAX) tablet 25 mg  25 mg Oral TID PRN Motley-Mangrum, Jadeka A, PMHNP   25 mg at 11/18/23 0057   ibuprofen (ADVIL) tablet 600 mg  600 mg Oral Q6H PRN Bobbitt, Shalon E, NP   600 mg at 11/18/23 1325   magnesium hydroxide (MILK OF MAGNESIA) suspension 30 mL  30 mL Oral Daily PRN  Motley-Mangrum, Jadeka A, PMHNP   30 mL at 11/14/23 2102   nicotine (NICODERM CQ - dosed in mg/24 hours) patch 21 mg  21 mg Transdermal Daily Massengill, Harrold Donath, MD   21 mg at 11/18/23 0818   risperiDONE (RISPERDAL) tablet 3 mg  3 mg Oral QHS Golda Acre, MD       sertraline (ZOLOFT) tablet 50 mg  50 mg Oral Daily Golda Acre, MD   50 mg at 11/18/23 0816   traZODone (DESYREL) tablet 50 mg  50 mg Oral QHS PRN Motley-Mangrum, Jadeka A, PMHNP   50 mg at 11/18/23 0057   vitamin D3 (CHOLECALCIFEROL) tablet 2,000 Units  2,000 Units Oral Daily Golda Acre, MD   2,000 Units at 11/18/23 [phone]   PTA Medications: Medications Prior to Admission  Medication Sig Dispense Refill Last Dose/Taking   albuterol (VENTOLIN HFA) 108 (90 Base) MCG/ACT inhaler Inhale 2 puffs into the lungs every 6 (six) hours as needed for shortness of breath or wheezing.   Past Week   atorvastatin (LIPITOR) 20 MG tablet Take 20 mg by mouth at bedtime.   Taking   gabapentin (NEURONTIN) 300 MG capsule Take 1 capsule (300 mg total) by mouth 3 (three) times daily. 90 capsule 3 Taking    Patient Stressors:    Patient Strengths:  Treatment Modalities: Medication Management, Group therapy, Case management,  1 to 1 session with clinician, Psychoeducation, Recreational therapy.   Physician Treatment Plan for Primary Diagnosis: Schizoaffective disorder, depressive type (HCC) Long Term Goal(s): Improvement in symptoms so as ready for discharge   Short Term Goals: Compliance with prescribed medications will improve  Medication Management: Evaluate patient's response, side effects, and tolerance of medication regimen.  Therapeutic Interventions: 1 to 1 sessions, Unit Group sessions and Medication administration.  Evaluation of Outcomes: Progressing  Physician Treatment Plan for Secondary Diagnosis: Principal Problem:   Schizoaffective disorder, depressive type (HCC) Active Problems:   History of tobacco abuse    Bipolar 1 disorder (HCC)  Long Term Goal(s): Improvement in symptoms so as ready for discharge   Short Term Goals: Compliance with prescribed medications will improve     Medication Management: Evaluate patient's response, side effects, and tolerance of medication regimen.  Therapeutic Interventions: 1 to 1 sessions, Unit Group sessions and Medication administration.  Evaluation of Outcomes: Progressing   RN Treatment Plan for Primary Diagnosis: Schizoaffective disorder, depressive type (HCC) Long Term Goal(s): Knowledge of disease and therapeutic regimen to maintain health will improve  Short Term Goals: Ability to remain free from injury will improve, Ability to demonstrate self-control, Ability to verbalize feelings will improve, and Ability to identify and develop effective coping behaviors will improve  Medication Management: RN will administer medications as ordered by provider, will assess and evaluate patient's response and provide education to patient for prescribed medication. RN will report any adverse and/or side effects to prescribing provider.  Therapeutic Interventions: 1 on 1 counseling sessions, Psychoeducation, Medication administration, Evaluate responses to treatment, Monitor vital signs and CBGs as ordered, Perform/monitor CIWA, COWS, AIMS and Fall Risk screenings as ordered, Perform wound care treatments as ordered.  Evaluation of Outcomes: Progressing   LCSW Treatment Plan for Primary Diagnosis: Schizoaffective disorder, depressive type (HCC) Long Term Goal(s): Safe transition to appropriate next level of care at discharge, Engage patient in therapeutic group addressing interpersonal concerns.  Short Term Goals: Engage patient in aftercare planning with referrals and resources, Increase ability to appropriately verbalize feelings, Facilitate acceptance of mental health diagnosis and concerns, and Identify triggers associated with mental health/substance abuse  issues  Therapeutic Interventions: Assess for all discharge needs, 1 to 1 time with Social worker, Explore available resources and support systems, Assess for adequacy in community support network, Educate family and significant other(s) on suicide prevention, Complete Psychosocial Assessment, Interpersonal group therapy.  Evaluation of Outcomes: Progressing   Progress in Treatment: Attending groups: attended some groups Participating in groups: Yes. Taking medication as prescribed: Yes. Toleration medication: Yes. Family/Significant other contact made: Yes, contacted Olin Hauser (mom) [phone] Patient understands diagnosis: Yes. Discussing patient identified problems/goals with staff: Yes. Medical problems stabilized or resolved: Yes. Denies suicidal/homicidal ideation: Yes. Issues/concerns per patient self-inventory: No.   New problem(s) identified: No, Describe:  none   New Short Term/Long Term Goal(s): medication stabilization, elimination of SI thoughts, development of comprehensive mental wellness plan.      Patient Goals:  "Medication management and coping skills I guess but I need a group home"   Discharge Plan or Barriers: Patient recently admitted. CSW will continue to follow and assess for appropriate referrals and possible discharge planning.      Reason for Continuation of Hospitalization: Depression Medication stabilization Suicidal ideation   Estimated Length of Stay: 4 - 6 days  Last 3 [place] Suicide Severity Risk Score: Flowsheet Row [facility]2023 [facility] from 09/09/2023 in [facility]  C-SSRS RISK CATEGORY No Risk No Risk No Risk       Last [facility] 2/9 Scores:    09/22/2021    8:59 AM 06/22/2021    2:14 PM 03/19/2021    8:48 AM  Depression screen PHQ 2/9   Decreased Interest 0 0 0  Down, Depressed, Hopeless 0 0 0  PHQ - 2 Score 0 0 0  Altered sleeping 0 0 0  Tired, decreased energy 0 0 0  Change in appetite 0 0 0  Feeling bad or failure about yourself  0 0 0  Trouble concentrating 0 0 0  Moving slowly or fidgety/restless 0 0 0  Suicidal thoughts 0 0 0  PHQ-9 Score 0 0 0  Difficult doing work/chores  Not difficult at all     Scribe for Treatment Team: Alla Feeling, LCSWA 11/18/2023 4:25 PM

## 2023-11-18 NOTE — Progress Notes (Signed)
Patient transported to [facility] via [facility] to have MRI done, transported off unit in wheelchair, accompanied by MHT. Patient returned to unit at this time, VSS, no apparent signs of distress noted, HS medications given. Will continue with plan of care.

## 2023-11-18 NOTE — Group Note (Signed)
Recreation Therapy Group Note   Group Topic:Team Building  Group Date: 11/18/2023 Start Time: 0930 End Time: 1000 Facilitators: Athena Baltz-McCall, LRT,CTRS Location: 300 Hall Dayroom   Group Topic: Communication, Team Building, Problem Solving  Goal Area(s) Addresses:  Patient will effectively work with peer towards shared goal.  Patient will identify skills used to make activity successful.  Patient will share challenges and verbalize solution-driven approaches used. Patient will identify how skills used during activity can be used to reach post d/c goals.   Intervention: STEM Activity   Activity: [employer]. Patients were provided the following materials: 4 drinking straws, 5 rubber bands, 5 paper clips, 2 index cards, and 2 drinking cups. Using the provided materials patients were asked to build a launching mechanism to launch a ping pong ball across the room, approximately 10 feet. Patients were divided into teams of 3-5. Instructions required all materials be incorporated into the device, functionality of items left to the peer group's discretion.  Education: Pharmacist, community, Scientist, physiological, Air cabin crew, Building control surveyor.   Education Outcome: Acknowledges education/In group clarification offered/Needs additional education.    Affect/Mood: Flat   Participation Level: Minimal   Participation Quality: Independent   Behavior: Attentive    Speech/Thought Process: Focused   Insight: Fair   Judgement: Fair    Modes of Intervention: STEM Activity   Patient Response to Interventions:  Attentive   Education Outcome:  In group clarification offered    Clinical Observations/Individualized Feedback: Pt was quiet but attentive as peers worked on and completed Engineer, materials. Pt was quiet and observant during group session.     Plan: Continue to engage patient in RT group sessions 2-3x/week.   Jaclene Bartelt-McCall, LRT,CTRS 11/18/2023 11:40 AM
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
## 2023-11-18 NOTE — Group Note (Signed)
Date:  11/18/2023 Time:  10:34 PM  Group Topic/Focus:  Wrap-Up Group:   The focus of this group is to help patients review their daily goal of treatment and discuss progress on daily workbooks.    Participation Level:  active  Participation Quality:  Appropriate  Affect:  Appropriate  Cognitive:  Appropriate  Insight: Appropriate  Engagement in Group:  Engaged  Modes of Intervention:  Education  Additional Comments:  Patient attended and participated in  AA group tonight.  Lita Mains [facility] 11/18/2023, 10:34 PM

## 2023-11-18 NOTE — Plan of Care (Signed)
Problem: Education: Goal: Emotional status will improve Outcome: Progressing Goal: Mental status will improve Outcome: Progressing Goal: Verbalization of understanding the information provided will improve Outcome: Progressing   Problem: Coping: Goal: Ability to demonstrate self-control will improve Outcome: Progressing   Problem: Safety: Goal: Periods of time without injury will increase Outcome: Progressing

## 2023-11-18 NOTE — Progress Notes (Signed)
Called [facility] at [phone] to schedule MRI for patient per MD order. Per [facility], pt had a MRI done last night and received contrast media and so they cannot repeat the MRI with contrast for 24 hours. [facility] informed this Clinical research associate that the patient can come in for an MRI at 11:30pm tonight. Pt's assigned nurse informed by this Clinical research associate. Will pass off information to pt's assigned night nurse.

## 2023-11-18 NOTE — Progress Notes (Signed)
   11/17/23 2100  Psych Admission Type (Psych Patients Only)  Admission Status Voluntary  Psychosocial Assessment  Patient Complaints Depression  Eye Contact Poor  Facial Expression Flat  Affect Depressed  Speech UTA  Interaction Minimal  Motor Activity Slow  Appearance/Hygiene Disheveled  Behavior Characteristics Cooperative  Mood Depressed  Thought Process  Coherency Tangential  Content UTA  Delusions UTA  Perception Hallucinations  Hallucination Auditory  Judgment Limited  Confusion Mild  Danger to Self  Current suicidal ideation? Denies (Denies)  Self-Injurious Behavior No self-injurious ideation or behavior indicators observed or expressed   Agreement Not to Harm Self Yes  Description of Agreement Verbal  Danger to Others  Danger to Others None reported or observed

## 2023-11-18 NOTE — Progress Notes (Signed)
Urine collection cup given to pt.

## 2023-11-18 NOTE — Group Note (Signed)
Date:  11/18/2023 Time:  4:33 PM  Group Topic/Focus:  Goals Group:   The focus of this group is to help patients establish daily goals to achieve during treatment and discuss how the patient can incorporate goal setting into their daily lives to aide in recovery. Orientation:   The focus of this group is to educate the patient on the purpose and policies of crisis stabilization and provide a format to answer questions about their admission.  The group details unit policies and expectations of patients while admitted.    Participation Level:  Active  Participation Quality:  Appropriate  Affect:  Appropriate  Cognitive:  Appropriate  Insight: Appropriate  Engagement in Group:  Engaged  Modes of Intervention:  Discussion, Orientation, and Rapport Building  Additional Comments:   Pt actively participated in the Orientation and Goals group. Pt personal goals for today is to be proactive and to practice her daily stretching.  Edmund Hilda Tycen Dockter 11/18/2023, 4:33 PM

## 2023-11-19 ENCOUNTER — Encounter (HOSPITAL_COMMUNITY): Payer: Self-pay | Admitting: Psychiatry

## 2023-11-19 DIAGNOSIS — F251 Schizoaffective disorder, depressive type: Secondary | ICD-10-CM | POA: Diagnosis not present

## 2023-11-19 DIAGNOSIS — G319 Degenerative disease of nervous system, unspecified: Secondary | ICD-10-CM

## 2023-11-19 DIAGNOSIS — Z91148 Patient's other noncompliance with medication regimen for other reason: Secondary | ICD-10-CM

## 2023-11-19 HISTORY — DX: Degenerative disease of nervous system, unspecified: G31.9

## 2023-11-19 HISTORY — DX: Patient's other noncompliance with medication regimen for other reason: Z91.148

## 2023-11-19 LAB — URINALYSIS, W/ REFLEX TO CULTURE (INFECTION SUSPECTED)
Bacteria, UA: NONE SEEN
Bilirubin Urine: NEGATIVE
Glucose, UA: NEGATIVE mg/dL
Ketones, ur: NEGATIVE mg/dL
Leukocytes,Ua: NEGATIVE
Nitrite: NEGATIVE
Protein, ur: NEGATIVE mg/dL
Specific Gravity, Urine: 1.015 (ref 1.005–1.030)
pH: 6 (ref 5.0–8.0)

## 2023-11-19 MED ORDER — GADOBUTROL 1 MMOL/ML IV SOLN
9.0000 mL | Freq: Once | INTRAVENOUS | Status: AC | PRN
  Administered 2023-11-19: 9 mL via INTRAVENOUS

## 2023-11-19 NOTE — Group Note (Signed)
Date:  11/19/2023 Time:  2:04 PM  Group Topic/Focus:  Dimensions of Wellness:   The focus of this group is to introduce the topic of wellness and discuss the role each dimension of wellness plays in total health.   Participation Level:  Did Not Attend  Participation Quality:   n/a  Affect:   n/a  Cognitive:   n/a  Insight: None  Engagement in Group:   n/a  Modes of Intervention:   n/a  Additional Comments:   Pt did not attend.   Edmund Hilda Jivan Symanski 11/19/2023, 2:04 PM

## 2023-11-19 NOTE — Plan of Care (Signed)
Problem: Education: Goal: Emotional status will improve Outcome: Progressing Goal: Mental status will improve Outcome: Progressing   Problem: Activity: Goal: Interest or engagement in activities will improve Outcome: Progressing Goal: Sleeping patterns will improve Outcome: Progressing   Problem: Safety: Goal: Periods of time without injury will increase Outcome: Progressing
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
## 2023-11-19 NOTE — Progress Notes (Signed)
Patient transported to [facility] via [facility] to have MRI done, transported off unit in wheelchair, accompanied by MHT. Patient returned to unit at this time, VSS, no apparent signs of distress noted, HS medications given. Will continue with plan of care.

## 2023-11-19 NOTE — Group Note (Signed)
Date:  11/19/2023 Time:  1:34 PM  Group Topic/Focus:  Goals Group:   The focus of this group is to help patients establish daily goals to achieve during treatment and discuss how the patient can incorporate goal setting into their daily lives to aide in recovery. Orientation:   The focus of this group is to educate the patient on the purpose and policies of crisis stabilization and provide a format to answer questions about their admission.  The group details unit policies and expectations of patients while admitted.    Participation Level:  Did Not Attend  Participation Quality:   n/a  Affect:   n/a  Cognitive:   n/a  Insight: None  Engagement in Group:   n/a  Modes of Intervention:   n/a  Additional Comments:   Pt did not attend.   Edmund Hilda Tylea Hise 11/19/2023, 1:34 PM

## 2023-11-19 NOTE — Plan of Care (Signed)
  Problem: Education: Goal: Emotional status will improve Outcome: Progressing   Problem: Activity: Goal: Interest or engagement in activities will improve Outcome: Progressing Goal: Sleeping patterns will improve Outcome: Progressing   Problem: Safety: Goal: Periods of time without injury will increase Outcome: Progressing   Problem: Physical Regulation: Goal: Ability to maintain clinical measurements within normal limits will improve Outcome: Progressing
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
## 2023-11-19 NOTE — Progress Notes (Signed)
   11/18/23 2100  Psych Admission Type (Psych Patients Only)  Admission Status Voluntary  Psychosocial Assessment  Patient Complaints Anxiety;Depression  Eye Contact Poor  Facial Expression Flat  Affect Depressed  Speech UTA  Interaction Minimal  Motor Activity Slow  Appearance/Hygiene Disheveled  Behavior Characteristics Cooperative  Mood Depressed;Anxious (Anxiety 7/10, Depression 4/10)  Thought Process  Coherency Tangential  Content UTA  Delusions UTA  Perception Hallucinations  Hallucination Auditory  Judgment Limited  Confusion Mild  Danger to Self  Current suicidal ideation? Denies (Denies)  Self-Injurious Behavior No self-injurious ideation or behavior indicators observed or expressed   Agreement Not to Harm Self Yes  Description of Agreement Verbal  Danger to Others  Danger to Others None reported or observed

## 2023-11-19 NOTE — Progress Notes (Signed)
Patient signed 72 hour request for discharge at 1555. Assigned MD and NP notified. Safety maintained.

## 2023-11-19 NOTE — Progress Notes (Signed)
Adult Psychoeducational Group Note  Date:  11/19/2023 Time:  8:58 PM  Group Topic/Focus:  Wrap-Up Group:   The focus of this group is to help patients review their daily goal of treatment and discuss progress on daily workbooks.  Participation Level:  Active  Participation Quality:  Appropriate  Affect:  Appropriate  Cognitive:  Appropriate  Insight: Appropriate  Engagement in Group:  Engaged  Modes of Intervention:  Discussion  Additional Comments:  Pt stated she had a good day.  Pt stated her goal for the day was to continue her training and write in her journal. Pt met goal.  Wynema Birch D 11/19/2023, 8:58 PM

## 2023-11-19 NOTE — Progress Notes (Signed)
Patient rated her depression level 8/10 and her anxiety level 4/10 with 10 being the highest and 0 none. Pt incontinent of urine on shift, requiring assistance with her ADL's and incontinent care. Pt endorsed passive SI and verbally agreed not to harm herself. Pt endorsed AVH. Medication compliant. Minimal interaction observed with peers. Appetite good on shift. Safety maintained.  11/19/23 0855  Psych Admission Type (Psych Patients Only)  Admission Status Voluntary  Psychosocial Assessment  Patient Complaints Anxiety;Depression  Eye Contact Poor  Facial Expression Flat  Affect Anxious;Depressed  Speech Soft  Interaction Minimal  Motor Activity Slow  Appearance/Hygiene Disheveled  Behavior Characteristics Cooperative  Mood Depressed;Anxious  Thought Process  Coherency WDL  Content WDL  Delusions None reported or observed  Perception Hallucinations  Hallucination Auditory;Visual (Pt reports hearing and seeing people talking to each other.)  Judgment Limited  Confusion None  Danger to Self  Current suicidal ideation? Passive  Agreement Not to Harm Self Yes  Description of Agreement Verbal  Danger to Others  Danger to Others None reported or observed

## 2023-11-20 ENCOUNTER — Encounter (HOSPITAL_COMMUNITY): Payer: Self-pay | Admitting: Psychiatry

## 2023-11-20 DIAGNOSIS — F251 Schizoaffective disorder, depressive type: Secondary | ICD-10-CM | POA: Diagnosis not present

## 2023-11-20 MED ORDER — BENZOCAINE 10 % MT GEL
Freq: Three times a day (TID) | OROMUCOSAL | Status: DC | PRN
  Administered 2023-11-20: 1 via OROMUCOSAL
  Filled 2023-11-20: qty 9

## 2023-11-20 MED ORDER — WHITE PETROLATUM EX OINT
TOPICAL_OINTMENT | CUTANEOUS | Status: AC
  Administered 2023-11-20: 1
  Filled 2023-11-20: qty 5

## 2023-11-20 NOTE — Plan of Care (Addendum)
D:  Patient denied SI and HI, contracts for safety.  Denied A/V hallucinations.   A:  Medications administered per MD orders.  Emotional support and encouragement given patient. R:  Safety maintained with 15 minute checks.  Patient's self inventory sheet, patient sleeps good, sleep medicine given.  Good appetite, low energy level, good concentration.  Rated depression 4, hopeless 7, anxiety 10.  Withdrawals, tremors, chilling, runny nose, irritability.  Denied SI while talking to nurse this morning.  Physical problems, pain, headaches.  Physical pain, gums, using walker to ambulate.  Pain Medicine yes.  Goal is discuss group homes, medicines.  No discharge plans.

## 2023-11-20 NOTE — [facility] Group Notes (Signed)
[facility] LCSW Group Therapy Note  Date/Time:  11/20/2023/ 10:15-11:00  Type of Therapy and Topic:  Group Therapy:  Trust & Honesty   Participation Level:  Active   Description of Group In this group patient will be asked to explore value of being honest. Patient will be guided to discuss their thoughts, feelings, and behaviors related to honesty and trusting in others. Patient will process together how trust and honesty relate to how we form relationships with peers, family member, and self. Each patient will be challenged to identify and express feelings of being vulnerable. Patient will discuss reasons why people are dishonest and identify alternative outcomes if one was truthful to self or others). This group will be process-oriented, with patients participating in explorations of their own experiences as well as giving and receiving support and challenged from other groups members.   Therapeutic Goals Patient will identify why honesty is important to relationships and how honesty overall affects relationships. Patient will identify a situation where they lied or were lied too and the feelings, thought process, and behaviors surrounding the situation. Patient will identify the meaning of being vulnerable, how that feels, and how that correlate to being honest with self and others. Patient will identify situations where they could have told the truth, but instead lied and explain reasons of dishonesty.   Summary of Patient Progress:  Patient share that honest can build relationship and you can get to know the real person of who they are as a person. Client share that if they person is dishonest you can not build relationship with that person with and its hard to build rapport.    Therapeutic Modalities Cognitive Behavioral Therapy Motivational Interviewing
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
## 2023-11-20 NOTE — Progress Notes (Signed)
PATIENT RESCINDED 72 HR REQUEST FOR DISCHARGE ON  November 20, 2023 AT  1010.

## 2023-11-20 NOTE — Plan of Care (Signed)
Nurse discussed anxiety, depression and coping skills with patient.

## 2023-11-20 NOTE — [facility] Group Notes (Signed)
Adult Psychoeducational Group Note  Date:  11/20/2023 Time:  8:58 PM  Group Topic/Focus:  Wrap-Up Group:   The focus of this group is to help patients review their daily goal of treatment and discuss progress on daily workbooks.  Participation Level:  Active  Participation Quality:  Appropriate  Affect:  Appropriate  Cognitive:  Appropriate  Insight: Appropriate  Engagement in Group:  Engaged  Modes of Intervention:  Discussion  Additional Comments:   Pt states that she had a good day today. Pt participated in all activities today and spoke with her care team. Pt expressed some issues with her housing and states it made her want to delay her d/c. Pt advised to speak with doctor. Pt denied everything   Vevelyn Pat 11/20/2023, 8:58 PM

## 2023-11-20 NOTE — Progress Notes (Signed)
   11/19/23 2100  Psych Admission Type (Psych Patients Only)  Admission Status Voluntary/72 hour document signed  Psychosocial Assessment  Patient Complaints Anxiety;Depression (Anxiety 4/10, Depression 5/10)  Eye Contact Poor  Facial Expression Flat  Affect Depressed  Speech UTA  Interaction Minimal  Motor Activity Slow  Appearance/Hygiene Improved  Behavior Characteristics Cooperative  Mood Depressed;Anxious  Thought Process  Coherency WDL  Content WDL  Delusions None reported or observed  Perception Hallucinations  Hallucination Auditory  Judgment Limited  Confusion None  Danger to Self  Current suicidal ideation? Denies (Denies)  Self-Injurious Behavior No self-injurious ideation or behavior indicators observed or expressed   Agreement Not to Harm Self Yes  Description of Agreement Verbal  Danger to Others  Danger to Others None reported or observed

## 2023-11-21 DIAGNOSIS — F251 Schizoaffective disorder, depressive type: Secondary | ICD-10-CM | POA: Diagnosis not present

## 2023-11-21 MED ORDER — MAGNESIUM CITRATE PO SOLN
1.0000 | Freq: Once | ORAL | Status: AC
  Administered 2023-11-21: 1 via ORAL
  Filled 2023-11-21: qty 296

## 2023-11-21 NOTE — Progress Notes (Signed)
   11/21/23 1100  Psych Admission Type (Psych Patients Only)  Admission Status Voluntary  Psychosocial Assessment  Patient Complaints Anxiety;Depression;Sadness  Eye Contact Brief  Facial Expression Flat;Sad  Affect Depressed  Speech Soft;Slow  Interaction Assertive  Motor Activity Slow  Appearance/Hygiene Unremarkable  Behavior Characteristics Cooperative  Mood Anxious;Depressed;Sad  Thought Process  Coherency WDL  Content WDL  Delusions None reported or observed  Perception WDL  Hallucination None reported or observed  Judgment Limited  Confusion None  Danger to Self  Current suicidal ideation? Denies  Self-Injurious Behavior No self-injurious ideation or behavior indicators observed or expressed   Agreement Not to Harm Self Yes  Description of Agreement verbal  Danger to Others  Danger to Others None reported or observed

## 2023-11-21 NOTE — Progress Notes (Signed)
Chaplain followed up with [place] after the grief and loss group.  She shared about grief and trauma that she has experienced in life including grief about the loss of her health and well-being.  She requested a Bible and chaplain asked her about meaningful verses that she liked to read.  Chaplain provided spiritual support through listening and prayer.

## 2023-11-21 NOTE — Progress Notes (Signed)
   11/21/23 2108  Psych Admission Type (Psych Patients Only)  Admission Status Voluntary  Psychosocial Assessment  Patient Complaints Anxiety;Depression  Eye Contact Fair  Facial Expression Flat  Affect Depressed  Speech Soft;Slow  Interaction Assertive  Motor Activity Slow  Appearance/Hygiene Unremarkable  Behavior Characteristics Cooperative  Mood Depressed;Anxious  Thought Process  Coherency WDL  Content WDL  Delusions None reported or observed  Perception WDL  Hallucination None reported or observed  Judgment Limited  Confusion None  Danger to Self  Current suicidal ideation? Denies  Self-Injurious Behavior No self-injurious ideation or behavior indicators observed or expressed   Agreement Not to Harm Self Yes  Description of Agreement verbal  Danger to Others  Danger to Others None reported or observed

## 2023-11-21 NOTE — Plan of Care (Signed)
  Problem: Activity: Goal: Interest or engagement in activities will improve Outcome: Progressing   Problem: Safety: Goal: Periods of time without injury will increase Outcome: Progressing

## 2023-11-21 NOTE — [facility] Group Notes (Signed)
The focus of this group is to help patients establish daily goals to achieve during treatment and discuss how the patient can incorporate goal setting into their daily lives to aide in recovery.   Scale 1-10 4 out of 10     Goal: help from nurses for bathroom

## 2023-11-21 NOTE — Progress Notes (Signed)
   11/20/23 2157  Psych Admission Type (Psych Patients Only)  Admission Status Voluntary  Psychosocial Assessment  Patient Complaints Anxiety;Depression  Eye Contact Fair  Facial Expression Flat  Affect Depressed  Speech Soft  Interaction Minimal  Motor Activity Slow  Appearance/Hygiene Unremarkable  Behavior Characteristics Cooperative  Mood Depressed;Anxious  Aggressive Behavior  Effect No apparent injury  Thought Process  Coherency WDL  Content WDL  Delusions None reported or observed  Perception WDL  Hallucination None reported or observed  Judgment Limited  Confusion None  Danger to Self  Current suicidal ideation? Denies  Self-Injurious Behavior No self-injurious ideation or behavior indicators observed or expressed   Agreement Not to Harm Self Yes  Description of Agreement verbal  Danger to Others  Danger to Others None reported or observed

## 2023-11-21 NOTE — Group Note (Signed)
Recreation Therapy Group Note   Group Topic:Stress Management  Group Date: 11/21/2023 Start Time: 0932 End Time: 6045 Facilitators: Kennie Snedden-McCall, LRT,CTRS Location: [address] Dayroom   Group Topic: Stress Management  Goal Area(s) Addresses:  Patient will identify positive stress management techniques. Patient will identify benefits of using stress management post d/c.  Intervention: Meditation App   Activity: Meditation. LRT played a meditation that focused on over thinking situations. The meditation focused on how to control anxiety, think positive thoughts and stay in the moment as opposed to thinking ahead about situations beyond your control.     Education:  Stress Management, Discharge Planning.   Education Outcome: Acknowledges Education   Affect/Mood: Appropriate   Participation Level: Active   Participation Quality: Independent   Behavior: Attentive    Speech/Thought Process: Focused   Insight: Good   Judgement: Good   Modes of Intervention: Meditation   Patient Response to Interventions:  Engaged   Education Outcome:  In group clarification offered    Clinical Observations/Individualized Feedback: Pt attended and participated in group session.     Plan: Continue to engage patient in RT group sessions 2-3x/week.   Kyngston Pickelsimer-McCall, LRT,CTRS 11/21/2023 12:02 PM
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
## 2023-11-21 NOTE — Progress Notes (Signed)
     11/21/2023       10:15 AM   Allison Moore   No update regarding pt's PASRR information per Tunisha in TOC. Spoke with pt this morning who is still agreeable to SNF, requesting one that she went to before off of [employer]. Pt also inquired about [place] her [place] instead. Pt then stated "but there is no room for me there."   Will continue to follow up and assist.   Signed:  Sander Remedios, LCSW-A 11/21/2023  10:15 AM

## 2023-11-21 NOTE — Plan of Care (Signed)
  Problem: Education: Goal: Knowledge of [facility] Education information/materials will improve Outcome: Progressing Goal: Emotional status will improve Outcome: Progressing Goal: Mental status will improve Outcome: Progressing Goal: Verbalization of understanding the information provided will improve Outcome: Progressing   Problem: Activity: Goal: Interest or engagement in activities will improve Outcome: Progressing   Problem: Coping: Goal: Ability to demonstrate self-control will improve Outcome: Progressing   Problem: Safety: Goal: Periods of time without injury will increase Outcome: Progressing

## 2023-11-21 NOTE — Group Note (Signed)
Date:  11/21/2023 Time:  10:22 PM  Group Topic/Focus:  Wrap-Up Group:   The focus of this group is to help patients review their daily goal of treatment and discuss progress on daily workbooks.    Participation Level:  Active  Participation Quality:  Appropriate and Attentive  Affect:  Appropriate  Cognitive:  Appropriate  Insight: Appropriate and Good  Engagement in Group:  Engaged  Modes of Intervention:  Discussion  Additional Comments:  patient stated that she had a good day  She rated her day a 7 our of a 10.  She stated that she was excited about praying with the chaplin.  Her goal is to learn more about her mental health.  Alfonse Ras 11/21/2023, 10:22 PM

## 2023-11-22 DIAGNOSIS — F251 Schizoaffective disorder, depressive type: Secondary | ICD-10-CM | POA: Diagnosis not present

## 2023-11-22 MED ORDER — NICOTINE POLACRILEX 2 MG MT GUM: 2.0000 mg | CHEWING_GUM | OROMUCOSAL | Status: DC | PRN

## 2023-11-22 MED ORDER — SERTRALINE HCL 100 MG PO TABS
100.0000 mg | ORAL_TABLET | Freq: Every day | ORAL | Status: DC
  Administered 2023-11-23 – 2023-11-25 (×3): 100 mg via ORAL
  Filled 2023-11-22 (×5): qty 1

## 2023-11-22 NOTE — Group Note (Signed)
Recreation Therapy Group Note   Group Topic:Animal Assisted Therapy   Group Date: 11/22/2023 Start Time: 0944 End Time: 1030 Facilitators: Eshawn Coor-McCall, LRT,CTRS Location: [address] Dayroom   Animal-Assisted Activity (AAA) Program Checklist/Progress Notes Patient Eligibility Criteria Checklist & Daily Group note for Rec Tx Intervention  AAA/T Program Assumption of Risk Form signed by Patient/ or Parent Legal Guardian Yes  Patient is free of allergies or severe asthma Yes  Patient reports no fear of animals Yes  Patient reports no history of cruelty to animals Yes  Patient understands his/her participation is voluntary Yes  Patient washes hands before animal contact Yes  Patient washes hands after animal contact Yes  Education: Hand Washing, Appropriate Animal Interaction   Education Outcome: Acknowledges education.    Affect/Mood: Appropriate   Participation Level: Minimal   Participation Quality: Independent   Behavior: Appropriate   Speech/Thought Process: Focused   Insight: Good   Judgement: Good   Modes of Intervention: Teaching laboratory technician   Patient Response to Interventions:  Engaged   Education Outcome:  In group clarification offered    Clinical Observations/Individualized Feedback: Pt came in late to group. Pt was only in group long enough to greet the therapy dog team before being called out to meet with doctor.    Plan: Continue to engage patient in RT group sessions 2-3x/week.   Idalia Allbritton-McCall, LRT,CTRS 11/22/2023 2:08 PM

## 2023-11-22 NOTE — [facility] Group Notes (Signed)
[facility] Group Notes:  (Nursing/MHT/Case Management/Adjunct)  Date:  11/22/2023  Time:  2000  Type of Therapy:   wrap up group  Participation Level:  Active  Participation Quality:  Appropriate and Attentive  Affect:  Appropriate  Cognitive:  Alert and Appropriate  Insight:  Appropriate  Engagement in Group:  Engaged  Modes of Intervention:  Discussion  Summary of Progress/Problems:  Fay Records 11/22/2023, 9:53 PM
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
## 2023-11-22 NOTE — Progress Notes (Signed)
   11/22/23 2300  Psych Admission Type (Psych Patients Only)  Admission Status Voluntary  Psychosocial Assessment  Patient Complaints Anxiety  Eye Contact Fair  Facial Expression Flat  Affect Depressed  Speech Soft;Slow  Interaction Assertive  Motor Activity Slow  Appearance/Hygiene Poor hygiene  Behavior Characteristics Cooperative  Mood Depressed  Thought Process  Coherency WDL  Content WDL  Delusions None reported or observed  Perception WDL  Hallucination None reported or observed  Judgment Limited  Confusion None  Danger to Self  Current suicidal ideation? Denies  Self-Injurious Behavior No self-injurious ideation or behavior indicators observed or expressed   Agreement Not to Harm Self Yes  Description of Agreement verbal  Danger to Others  Danger to Others None reported or observed
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
## 2023-11-22 NOTE — Progress Notes (Signed)
   11/22/23 0554  15 Minute Checks  Location Bedroom  Visual Appearance Calm  Behavior Sleeping  Sleep (Behavioral Health Patients Only)  Calculate sleep? (Click Yes once per 24 hr at 0600 safety check) Yes  Documented sleep last 24 hours 6.25

## 2023-11-22 NOTE — Group Note (Signed)
Date:  11/22/2023 Time:  4:48 PM  Group Topic/Focus:  Goals Group:   The focus of this group is to help patients establish daily goals to achieve during treatment and discuss how the patient can incorporate goal setting into their daily lives to aide in recovery. Orientation:   The focus of this group is to educate the patient on the purpose and policies of crisis stabilization and provide a format to answer questions about their admission.  The group details unit policies and expectations of patients while admitted.    Participation Level:  Did Not Attend  Participation Quality:   n/a  Affect:   n/a  Cognitive:   n/a  Insight: None  Engagement in Group:   n/a  Modes of Intervention:   n/a  Additional Comments:   Pt did not attend.   Edmund Hilda Pancho Rushing 11/22/2023, 4:48 PM

## 2023-11-22 NOTE — Group Note (Signed)
LCSW Group Therapy Note   Group Date: 11/22/2023 Start Time: 1100 End Time: 1200   Type of Therapy:  Group Therapy  Topic:  Finding Balance: Using Wise Mind for Thoughtful Decisions  Participation Level:  patient was present and participated in the discussion.   Objective:  the objective of this class is to help participants understand the concept of Weston Settle Mind and learn how to apply it to real-life situations to make balanced, thoughtful decisions. Participants will gain tools to manage emotions, consider logic, and find a middle ground that leads to healthier responses and outcomes.  Goals: Understand the concept of Wise Mind.  Participants will learn the difference between Emotional Mind, Reasonable Mind, and Weston Settle Mind, and how [place] Sett[place] Mind helps in balancing emotions and logic to make thoughtful decisions. Recognize when you're in Emotional Mind or Reasonable Mind.  Participants will identify the signs of Emotional Mind and Reasonable Mind in their own reactions to situations and understand how to move into Wise Mind for more balanced responses. Practice applying Weston Settle Mind to real-life situations.  Through scenarios and group activities, participants will practice using Wise Mind in everyday situations, learning how to acknowledge their emotions, think logically, and create solutions that are thoughtful and balanced.  Summary: In this class, we explored the concept of Wise Mind--the balance between Emotional Mind and Reasonable Mind. We discussed how Emotional Mind can sometimes lead to impulsive, reactive decisions driven by intense feelings, and how Reasonable Mind might ignore feelings altogether, focusing only on facts and logic. Weston Settle Mind is the middle ground that combines both, allowing you to consider your emotions and use logic to make balanced, thoughtful decisions.  We learned how to recognize when we're in Emotional Mind or Reasonable Mind, and practiced using Wise Mind in real-life  situations. By using Alfonse Flavors, we can improve how we handle challenging situations, make better decisions, and strengthen our relationships with others.   Alla Feeling, LCSWA 11/22/2023  1:31 PM

## 2023-11-22 NOTE — Plan of Care (Signed)
Problem: Education: Goal: Knowledge of [facility] Education information/materials will improve Outcome: Progressing Goal: Emotional status will improve Outcome: Progressing Goal: Mental status will improve Outcome: Progressing Goal: Verbalization of understanding the information provided will improve Outcome: Progressing

## 2023-11-22 NOTE — Progress Notes (Signed)
     11/22/2023       10:54 AM   Shanique Huron   Type of Note: PASRR evaluation   Titus Dubin, LCSW came and completed evaluation with this Clinical research associate and patient at pt's bedside. Assessment lasted about 50 minutes. Perlie Gold reports that his assessment will be submitted to the state tomorrow morning and typically within 24 hours there will be a decision will be made regarding PASRR status. Will continue to assist and update.  Signed:  Rashada Klontz, LCSW-A 11/22/2023  10:54 AM

## 2023-11-22 NOTE — Progress Notes (Signed)
   11/22/23 1023  Psych Admission Type (Psych Patients Only)  Admission Status Voluntary  Psychosocial Assessment  Patient Complaints Anxiety  Eye Contact Fair  Facial Expression Flat  Affect Depressed  Speech Soft;Slow  Interaction Assertive  Motor Activity Slow  Appearance/Hygiene Poor hygiene  Behavior Characteristics Cooperative  Mood Depressed  Thought Process  Coherency WDL  Content WDL  Delusions None reported or observed  Perception WDL  Hallucination None reported or observed  Judgment Limited  Confusion None  Danger to Self  Current suicidal ideation? Denies  Self-Injurious Behavior No self-injurious ideation or behavior indicators observed or expressed   Agreement Not to Harm Self Yes  Description of Agreement Verbally  Danger to Others  Danger to Others None reported or observed
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
## 2023-11-23 ENCOUNTER — Encounter (HOSPITAL_COMMUNITY): Payer: Self-pay

## 2023-11-23 DIAGNOSIS — F251 Schizoaffective disorder, depressive type: Secondary | ICD-10-CM | POA: Diagnosis not present

## 2023-11-23 NOTE — Group Note (Signed)
Date:  11/23/2023 Time:  4:31 PM  Group Topic/Focus:  Dimensions of Wellness:   The focus of this group is to introduce the topic of wellness and discuss the role each dimension of wellness plays in total health.    Participation Level:  Active  Participation Quality:  Appropriate  Affect:  Appropriate  Cognitive:  Appropriate  Insight: Appropriate  Engagement in Group:  Engaged  Modes of Intervention:  Activity and Education  Additional Comments:   Pt attended and participated in the Physical Wellness group. Pt was attentive as MHT provided education on Mindful Progressive Relaxation. Pt practiced the relaxation technique with the group.  Allison Moore 11/23/2023, 4:31 PM

## 2023-11-23 NOTE — Plan of Care (Signed)
Problem: Education: Goal: Knowledge of [facility] Education information/materials will improve Outcome: Progressing Goal: Emotional status will improve Outcome: Progressing
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
## 2023-11-23 NOTE — Group Note (Signed)
Recreation Therapy Group Note   Group Topic:Health and Wellness  Group Date: 11/23/2023 Start Time: 0930 End Time: 1000 Facilitators: Cherrelle Plante-McCall, LRT,CTRS Location: [address] Dayroom   Group Topic: Wellness  Goal Area(s) Addresses:  Patient will define components of whole wellness. Patient will verbalize benefit of whole wellness.  Intervention: Music  Activity: Exercise. Patients took turns leading the group in the exercises of their choosing. The group completed three rounds of stretching and exercise. Patients were encouraged to get water, take breaks and not to over extend themselves during the course of the activity. Emphasis was placed on patients paying attention to their bodies and what exercises they are able to complete.   Education: Wellness, Building control surveyor.   Education Outcome: Acknowledges education/In group clarification offered/Needs additional education.   Affect/Mood: Appropriate   Participation Level: Minimal   Participation Quality: Independent   Behavior: Appropriate   Speech/Thought Process: Focused   Insight: Good   Judgement: Good   Modes of Intervention: Music   Patient Response to Interventions:  Receptive   Education Outcome:  In group clarification offered    Clinical Observations/Individualized Feedback: Pt came in for the last few minutes of the final round of group. Pt was able to complete the stretches she was in group for.    Plan: Continue to engage patient in RT group sessions 2-3x/week.   Macky Galik-McCall, LRT,CTRS 11/23/2023 12:36 PM

## 2023-11-23 NOTE — Progress Notes (Signed)
   11/23/23 1800  Psych Admission Type (Psych Patients Only)  Admission Status Voluntary  Psychosocial Assessment  Patient Complaints Depression  Eye Contact Fair  Facial Expression Flat  Affect Depressed  Speech Soft;Slow  Interaction Assertive  Motor Activity Slow  Appearance/Hygiene Poor hygiene  Behavior Characteristics Cooperative  Mood Depressed  Thought Process  Coherency WDL  Content WDL  Delusions None reported or observed  Perception Depersonalization  Hallucination None reported or observed  Judgment Limited  Confusion None  Danger to Self  Current suicidal ideation? Denies  Self-Injurious Behavior No self-injurious ideation or behavior indicators observed or expressed   Agreement Not to Harm Self Yes  Description of Agreement verbal contract  Danger to Others  Danger to Others None reported or observed

## 2023-11-23 NOTE — Progress Notes (Signed)
     11/23/2023       11:23 AM   Allison Moore   Type of Note: SNF update  Pt was approved; [facility]RR #[id number]. Pt qualifies for 30-, 60-, 90- day SNF placement. [facility] updated with information. Information sent via [facility] to multiple facilities for placement in the [place]. Will continue to assist.  Signed:  Garreth Burnsworth, LCSW-A 11/23/2023  11:23 AM

## 2023-11-23 NOTE — Group Note (Signed)
Date:  11/23/2023 Time:  9:53 PM  Group Topic/Focus:  Narcotics Anonymous (NA) Meeting    Participation Level:  Active  Participation Quality:  Appropriate  Affect:  Appropriate  Cognitive:  Appropriate  Insight: Appropriate  Engagement in Group:  Engaged  Modes of Intervention:  Socialization and Support  Additional Comments:  Patient attended NA meeting  Allison Moore 11/23/2023, 9:53 PM

## 2023-11-23 NOTE — Group Note (Signed)
Date:  11/23/2023 Time:  11:54 AM  Group Topic/Focus:  Goals Group:   The focus of this group is to help patients establish daily goals to achieve during treatment and discuss how the patient can incorporate goal setting into their daily lives to aide in recovery. Orientation:   The focus of this group is to educate the patient on the purpose and policies of crisis stabilization and provide a format to answer questions about their admission.  The group details unit policies and expectations of patients while admitted.    Participation Level:  Active  Participation Quality:  Attentive  Affect:  Appropriate  Cognitive:  Disorganized  Insight: Appropriate  Engagement in Group:  Engaged  Modes of Intervention:  Activity, Orientation, and Rapport Building  Additional Comments:   Pt attended and participated in the Orientation and Goals group. Pt completed the SMART goals activity. Pt identified getting married, read about CNA  and knowing about herself as personal goals.   Allison Moore 11/23/2023, 11:54 AM

## 2023-11-23 NOTE — BH IP Treatment Plan (Signed)
Interdisciplinary Treatment and Diagnostic Plan Update  11/23/2023 Time of Session: 1150 - UPDATE Allison Moore MRN: [id number]  Principal Diagnosis: Schizoaffective disorder, depressive type (HCC)  Secondary Diagnoses: Principal Problem:   Schizoaffective disorder, depressive type (HCC) Active Problems:   History of tobacco abuse   Bipolar 1 disorder (HCC)   Current Medications:  Current Facility-Administered Medications  Medication Dose Route Frequency Provider Last Rate Last Admin   alum & mag hydroxide-simeth (MAALOX/MYLANTA) 200-200-20 MG/5ML suspension 30 mL  30 mL Oral Q4H PRN Motley-Mangrum, Jadeka A, PMHNP       benzocaine (ORAJEL) 10 % mucosal gel   Mouth/Throat TID PRN Lauro Franklin, MD   Given at 11/21/23 1057   haloperidol (HALDOL) tablet 5 mg  5 mg Oral TID PRN Motley-Mangrum, Jadeka A, PMHNP       And   diphenhydrAMINE (BENADRYL) capsule 50 mg  50 mg Oral TID PRN Motley-Mangrum, Jadeka A, PMHNP       haloperidol lactate (HALDOL) injection 5 mg  5 mg Intramuscular TID PRN Motley-Mangrum, Jadeka A, PMHNP       And   diphenhydrAMINE (BENADRYL) injection 50 mg  50 mg Intramuscular TID PRN Motley-Mangrum, Jadeka A, PMHNP       And   LORazepam (ATIVAN) injection 2 mg  2 mg Intramuscular TID PRN Motley-Mangrum, Jadeka A, PMHNP       haloperidol lactate (HALDOL) injection 10 mg  10 mg Intramuscular TID PRN Motley-Mangrum, Jadeka A, PMHNP       And   diphenhydrAMINE (BENADRYL) injection 50 mg  50 mg Intramuscular TID PRN Motley-Mangrum, Jadeka A, PMHNP       And   LORazepam (ATIVAN) injection 2 mg  2 mg Intramuscular TID PRN Motley-Mangrum, Jadeka A, PMHNP       gabapentin (NEURONTIN) capsule 300 mg  300 mg Oral TID Motley-Mangrum, Jadeka A, PMHNP   300 mg at 11/23/23 1246   hydrOXYzine (ATARAX) tablet 25 mg  25 mg Oral TID PRN Motley-Mangrum, Jadeka A, PMHNP   25 mg at 11/19/23 2039   ibuprofen (ADVIL) tablet 600 mg  600 mg Oral Q6H PRN Bobbitt, Shalon E, NP    600 mg at 11/23/23 1249   magnesium hydroxide (MILK OF MAGNESIA) suspension 30 mL  30 mL Oral Daily PRN Motley-Mangrum, Jadeka A, PMHNP   30 mL at 11/14/23 2102   nicotine (NICODERM CQ - dosed in mg/24 hours) patch 21 mg  21 mg Transdermal Daily Massengill, Nathan, MD   21 mg at 11/21/23 0746   nicotine polacrilex (NICORETTE) gum 2 mg  2 mg Oral Q2H PRN Starleen Blue, NP       risperiDONE (RISPERDAL) tablet 3 mg  3 mg Oral QHS Golda Acre, MD   3 mg at 11/22/23 2125   sertraline (ZOLOFT) tablet 100 mg  100 mg Oral Daily Izediuno, Vincent A, MD   100 mg at 11/23/23 0841   traZODone (DESYREL) tablet 50 mg  50 mg Oral QHS PRN Motley-Mangrum, Jadeka A, PMHNP   50 mg at 11/21/23 2108   vitamin D3 (CHOLECALCIFEROL) tablet 2,000 Units  2,000 Units Oral Daily Golda Acre, MD   2,000 Units at 11/23/23 [phone]   PTA Medications: Medications Prior to Admission  Medication Sig Dispense Refill Last Dose/Taking   albuterol (VENTOLIN HFA) 108 (90 Base) MCG/ACT inhaler Inhale 2 puffs into the lungs every 6 (six) hours as needed for shortness of breath or wheezing.   Past Week   atorvastatin (LIPITOR) 20 MG tablet  Take 20 mg by mouth at bedtime.   Taking   gabapentin (NEURONTIN) 300 MG capsule Take 1 capsule (300 mg total) by mouth 3 (three) times daily. 90 capsule 3 Taking    Patient Stressors:    Patient Strengths:    Treatment Modalities: Medication Management, Group therapy, Case management,  1 to 1 session with clinician, Psychoeducation, Recreational therapy.   Physician Treatment Plan for Primary Diagnosis: Schizoaffective disorder, depressive type (HCC) Long Term Goal(s): Improvement in symptoms so as ready for discharge   Short Term Goals: Compliance with prescribed medications will improve  Medication Management: Evaluate patient's response, side effects, and tolerance of medication regimen.  Therapeutic Interventions: 1 to 1 sessions, Unit Group sessions and Medication  administration.  Evaluation of Outcomes: Progressing  Physician Treatment Plan for Secondary Diagnosis: Principal Problem:   Schizoaffective disorder, depressive type (HCC) Active Problems:   History of tobacco abuse   Bipolar 1 disorder (HCC)  Long Term Goal(s): Improvement in symptoms so as ready for discharge   Short Term Goals: Compliance with prescribed medications will improve     Medication Management: Evaluate patient's response, side effects, and tolerance of medication regimen.  Therapeutic Interventions: 1 to 1 sessions, Unit Group sessions and Medication administration.  Evaluation of Outcomes: Progressing   RN Treatment Plan for Primary Diagnosis: Schizoaffective disorder, depressive type (HCC) Long Term Goal(s): Knowledge of disease and therapeutic regimen to maintain health will improve  Short Term Goals: Ability to identify and develop effective coping behaviors will improve  Medication Management: RN will administer medications as ordered by provider, will assess and evaluate patient's response and provide education to patient for prescribed medication. RN will report any adverse and/or side effects to prescribing provider.  Therapeutic Interventions: 1 on 1 counseling sessions, Psychoeducation, Medication administration, Evaluate responses to treatment, Monitor vital signs and CBGs as ordered, Perform/monitor CIWA, COWS, AIMS and Fall Risk screenings as ordered, Perform wound care treatments as ordered.  Evaluation of Outcomes: Progressing   LCSW Treatment Plan for Primary Diagnosis: Schizoaffective disorder, depressive type (HCC) Long Term Goal(s): Safe transition to appropriate next level of care at discharge, Engage patient in therapeutic group addressing interpersonal concerns.  Short Term Goals: Engage patient in aftercare planning with referrals and resources and Increase social support  Therapeutic Interventions: Assess for all discharge needs, 1 to 1  time with Social worker, Explore available resources and support systems, Assess for adequacy in community support network, Educate family and significant other(s) on suicide prevention, Complete Psychosocial Assessment, Interpersonal group therapy.  Evaluation of Outcomes: Progressing   Progress in Treatment: Attending groups: attended some groups Participating in groups: Yes. Taking medication as prescribed: Yes. Toleration medication: Yes. Family/Significant other contact made: Yes, contacted Olin Hauser (mom) [phone] Patient understands diagnosis: Yes. Discussing patient identified problems/goals with staff: Yes. Medical problems stabilized or resolved: Yes. Denies suicidal/homicidal ideation: Yes. Issues/concerns per patient self-inventory: No.   New problem(s) identified: No, Describe:  none   New Short Term/Long Term Goal(s): medication stabilization, elimination of SI thoughts, development of comprehensive mental wellness plan.      Patient Goals:  "Medication management and coping skills I guess but I need a group home"   Discharge Plan or Barriers: Patient recently admitted. CSW will continue to follow and assess for appropriate referrals and possible discharge planning.      Reason for Continuation of Hospitalization: Depression Medication stabilization Suicidal ideation   Estimated Length of Stay: 4 - 6 days  Last 3 [place] Suicide Severity Risk Score:  Flowsheet Row Ad[facility] (Current) [facility] from 11/10/2023 in [facility] Department at [facility] ED from 09/09/2023 in [facility] Emergency Department at [facility]  C-SSRS RISK CATEGORY No Risk No Risk No Risk       Last [facility] 2/9 Scores:    09/22/2021    8:59 AM 06/22/2021    2:14 PM 03/19/2021    8:48 AM  Depression screen PHQ 2/9  Decreased Interest 0 0 0  Down, Depressed, Hopeless 0 0 0  PHQ - 2 Score 0 0 0   Altered sleeping 0 0 0  Tired, decreased energy 0 0 0  Change in appetite 0 0 0  Feeling bad or failure about yourself  0 0 0  Trouble concentrating 0 0 0  Moving slowly or fidgety/restless 0 0 0  Suicidal thoughts 0 0 0  PHQ-9 Score 0 0 0  Difficult doing work/chores  Not difficult at all     Scribe for Treatment Team: Jacinta Shoe, LCSW 11/23/2023 4:28 PM

## 2023-11-23 NOTE — Plan of Care (Signed)
Problem: Education: Goal: Knowledge of [facility] Education information/materials will improve Outcome: Progressing Goal: Emotional status will improve Outcome: Progressing Goal: Mental status will improve Outcome: Progressing Goal: Verbalization of understanding the information provided will improve Outcome: Progressing

## 2023-11-24 DIAGNOSIS — F251 Schizoaffective disorder, depressive type: Secondary | ICD-10-CM | POA: Diagnosis not present

## 2023-11-24 NOTE — [facility] Group Notes (Signed)
Adult Psychoeducational Group Note  Date:  1/30/2025P Time:  2:08 PM  Group Topic/Focus:  Dimensions of Wellness:   The focus of this group is to introduce the topic of wellness and discuss the role each dimension of wellness plays in total health.  Participation Level:  Active  Participation Quality:  Attentive  Affect:  Appropriate  Cognitive:  Alert  Insight: Appropriate  Engagement in Group:  Engaged  Modes of Intervention:  Activity  Additional Comments:  Patient attended and participated in the Emotional wellness group activity.  Jearl Klinefelter 11/24/2023, 2:08 PM

## 2023-11-24 NOTE — Group Note (Signed)
LCSW Group Therapy Note   Group Date: 11/24/2023 Start Time: 1100 End Time: 1200   Type of Therapy:  Group Therapy  Topic:  Understanding Your Path to Change  Participation: patient was present and listening but didn't participate  Objective:  The goal is to help participants understand the stages of change, identify where they currently are in the process, and provide actionable next steps to continue moving forward in their journey of change.  Goals: Learn about the six stages of change:  Precontemplation, Contemplation, Preparation, Action, Maintenance, and Relapse Reflect on Current Change Efforts:  Recognize which stage participants are in regarding to a personal change. Plan Next Steps for Moving Forward:  Create an action plan based on their current stage of change.  Class Summary:  In this session, we explored the Stages of Change as a framework to understand the process of change.  We discussed how each stage helps individuals recognize where they are in their personal journey and used the Stages of Change Worksheet for self-reflection.  Participants answered questions to better understand their current stage, challenges, and progress. We also emphasized the importance of moving forward, even if setbacks (Relapse) occur, and created actionable steps to help participants continue progressing. By the end of the session, participants gained a clearer understanding of their path to change and left with a clear plan for next steps.  Therapeutic Modalities:  Elements of CBT (cognitive restructuring, problem solving)  Element of DBT (mindfulness, distress tolerance)   Alla Feeling, LCSWA 11/24/2023  6:33 PM
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
## 2023-11-24 NOTE — Progress Notes (Signed)
Collateral contact - [facility]   Genta and Shanice from "[facility] ACT Team visited patient in the hospital.  After the visit, ACTT workers said that they will accept patient to their program.  They will work with her for 6 months to 1 year before stepping her down to CST.   Kaela Beitz, LCSWA 11/24/2023

## 2023-11-24 NOTE — Progress Notes (Signed)
     11/24/2023       9:57 AM   Allison Moore   Type of Note: Dispo update  Pt has currently been denied from 30/50 SNF's due to age, mental health needs, and substance use hx. Due to pt having income, there was discussion of pt potentially discharging to a hotel with an ACTT in place. Pt has Medicare but this Clinical research associate contacted [facility] Doctor, general practice) who reports they have [place] state funding. Waiting on a response regarding day/time they can come and evaluate patient for ACTT services. MD and tx team aware. Will continue to update.   Signed:  Star Resler, LCSW-A 11/24/2023  9:57 AM

## 2023-11-24 NOTE — Progress Notes (Signed)
     11/24/2023       1:34 PM   Tanay Mccauley  Spoke with Genta with [facility], they will be here today at 4:00PM to complete assessment with pt. Spoke with pt who was very Adult nurse of this service. Pt agreeable to discharge to a hotel with ACTT support. MD will send referral for [facility] for pt to obtain physical therapy at discharge as well.   Will continue to assist.   Signed:  Zarriah Starkel, LCSW-A 11/24/2023  1:34 PM

## 2023-11-24 NOTE — Group Note (Signed)
Date:  11/24/2023 Time:  9:52 PM  Group Topic/Focus:  Wrap-Up Group:   The focus of this group is to help patients review their daily goal of treatment and discuss progress on daily workbooks.    Participation Level:  Active  Participation Quality:  Appropriate and Sharing  Affect:  Appropriate  Cognitive:  Appropriate  Insight: Appropriate  Engagement in Group:  Engaged  Modes of Intervention:  Activity and Socialization  Additional Comments:  Patients used and completed wrap up group sheets during group. Patient rated her day a 8/10. Patient shared that her goal for today was "to have a 9/10 day". Patient shared that she did achieve his goal. Patient shared coping skills that she finds most helpful is "working with doctors, nurses and techs". Patient shared something that she likes about herself is "I love myself for smiling and being more sociable and loving god/jesus and all from bible". Patient participated in group activity after sharing.   Kennieth Francois 11/24/2023, 9:52 PM

## 2023-11-24 NOTE — Plan of Care (Signed)
Problem: Education: Goal: Emotional status will improve Outcome: Progressing Goal: Mental status will improve Outcome: Progressing

## 2023-11-24 NOTE — Plan of Care (Signed)
Problem: Education: Goal: Knowledge of [facility] Education information/materials will improve Outcome: Progressing Goal: Emotional status will improve Outcome: Progressing Goal: Mental status will improve Outcome: Progressing Goal: Verbalization of understanding the information provided will improve Outcome: Progressing

## 2023-11-24 NOTE — Progress Notes (Addendum)
D. Pt presented with an improved mood today- brightened during interactions. Pt has been visible in the milieu ambulating with walker, observed attending groups. Per pt's self inventory, pt rated her depression,hopelessness and anxiety a 7/6/3, respectively. Pt reported that sleeping well last night, described her appetite and concentration as 'good', and energy level as 'low'. Pt currently denies SI/HI and AVH and does not appear to be responding to internal stimuli.  A. Labs and vitals monitored. Pt given and educated on medications. Pt received prn ibuprofen twice today (for left foot pain and headache). Pt supported emotionally and encouraged to express concerns and ask questions.   R. Pt remains safe with 15 minute checks. Will continue POC.    11/24/23 1600  Psych Admission Type (Psych Patients Only)  Admission Status Voluntary  Psychosocial Assessment  Patient Complaints Depression  Eye Contact Fair  Facial Expression Flat  Affect Depressed  Speech Slow  Interaction Assertive  Motor Activity Slow  Appearance/Hygiene Unremarkable  Behavior Characteristics Cooperative  Mood Depressed  Thought Process  Coherency WDL  Content WDL  Delusions None reported or observed  Perception WDL  Hallucination None reported or observed  Judgment Limited  Confusion None  Danger to Self  Current suicidal ideation? Denies  Self-Injurious Behavior No self-injurious ideation or behavior indicators observed or expressed   Danger to Others  Danger to Others None reported or observed

## 2023-11-25 MED ORDER — NICOTINE 21 MG/24HR TD PT24: 21.0000 mg | MEDICATED_PATCH | Freq: Every day | TRANSDERMAL | Status: DC

## 2023-11-25 MED ORDER — IBUPROFEN 600 MG PO TABS: 600.0000 mg | ORAL_TABLET | Freq: Four times a day (QID) | ORAL | Status: DC | PRN

## 2023-11-25 MED ORDER — RISPERIDONE 3 MG PO TABS: 3.0000 mg | ORAL_TABLET | Freq: Every day | ORAL | 0 refills | Status: AC

## 2023-11-25 MED ORDER — TRAZODONE HCL 50 MG PO TABS: 50.0000 mg | ORAL_TABLET | Freq: Every evening | ORAL | 0 refills | Status: DC | PRN

## 2023-11-25 MED ORDER — GABAPENTIN 300 MG PO CAPS: 300.0000 mg | ORAL_CAPSULE | Freq: Three times a day (TID) | ORAL | 0 refills | Status: AC

## 2023-11-25 MED ORDER — HYDROXYZINE HCL 25 MG PO TABS: 25.0000 mg | ORAL_TABLET | Freq: Three times a day (TID) | ORAL | 0 refills | Status: AC | PRN

## 2023-11-25 MED ORDER — SERTRALINE HCL 100 MG PO TABS: 100.0000 mg | ORAL_TABLET | Freq: Every day | ORAL | 0 refills | Status: AC

## 2023-11-25 MED ORDER — ATORVASTATIN CALCIUM 20 MG PO TABS: 20.0000 mg | ORAL_TABLET | Freq: Every day | ORAL | 0 refills | Status: AC

## 2023-11-25 MED ORDER — BENZOCAINE 10 % MT GEL: Freq: Three times a day (TID) | OROMUCOSAL | Status: DC | PRN

## 2023-11-25 MED ORDER — VITAMIN D3 25 MCG PO TABS: 2000.0000 [IU] | ORAL_TABLET | Freq: Every day | ORAL | 0 refills | Status: DC

## 2023-11-25 NOTE — Progress Notes (Signed)
Writer attempted to wake patient up to use the restroom for Bladder management d/t incontinence. She refused to get up to use the restroom. Will continue to monitor patient.

## 2023-11-25 NOTE — Group Note (Signed)
Recreation Therapy Group Note   Group Topic:Problem Solving  Group Date: 11/25/2023 Start Time: 0930 End Time: 1015 Facilitators: Whitni Pasquini-McCall, LRT,CTRS Location: [address] Dayroom   Group Topic: Problem Solving  Goal Area(s) Addresses:  Patient will effectively work in a team with other group members. Patient will verbalize importance of using appropriate problem solving techniques.  Patient will identify positive change associated with effective problem solving skills.   Intervention: Worksheets, Music  Activity: Patients were given two sheets of brain teasers. Patients were to work through each puzzle to come up with the answer. Patients had the option of working together or individually.    Education: Problem solving, Use of skills post discharge  Education Outcome: Acknowledges understanding/In group clarification offered/Needs additional education.    Affect/Mood: N/A   Participation Level: Did not attend    Clinical Observations/Individualized Feedback: Pt came as group ended.    Plan: Continue to engage patient in RT group sessions 2-3x/week.   Allison Moore, LRT,CTRS 11/25/2023 11:46 AM

## 2023-11-25 NOTE — Transportation (Signed)
11/25/2023  Allison Moore DOB: 1980/12/23 MRN: [id number]   RIDER WAIVER AND RELEASE OF LIABILITY  For the purposes of helping with transportation needs, [facility] partners with outside transportation providers (taxi companies, [place], Catering manager.) to give [employer] patients or other approved people the choice of on-demand rides [employer]") to our buildings for non-emergency visits.  By using [employer], I, the person signing this document, on behalf of myself and/or any legal minors (in my care using the [employer]), agree:  Science writer given to me are supplied by independent, outside transportation providers who do not work for, or have any affiliation with, [employer]. [facility] is not a transportation company. [facility] has no control over the quality or safety of the rides I get using [employer]. [facility] has no control over whether any outside ride will happen on time or not. [facility] gives no guarantee on the reliability, quality, safety, or availability on any rides, or that no mistakes will happen. I know and accept that traveling by vehicle (car, truck, SVU, Zenaida Niece, bus, taxi, etc.) has risks of serious injuries such as disability, being paralyzed, and death. I know and agree the risk of using [employer] is mine alone, and not [employer]. Transport Services are provided "as is" and as are available. The transportation providers are in charge for all inspections and care of the vehicles used to provide these rides. I agree not to take legal action against [facility], its agents, employees, officers, directors, representatives, insurers, attorneys, assigns, successors, subsidiaries, and affiliates at any time for any reasons related directly or indirectly to using [employer]. I also agree not to take legal action against [facility] or its affiliates for any injury, death, or damage to property caused by or related to using  [employer]. I have read this Waiver and Release of Liability, and I understand the terms used in it and their legal meaning. This Waiver is freely and voluntarily given with the understanding that my right (or any legal minors) to legal action against [place] relating to [employer] is knowingly given up to use these services.   I attest that I read the Ride Waiver and Release of Liability to Allison Moore, gave Ms. Stehlik the opportunity to ask questions and answered the questions asked (if any). I affirm that [place] then provided consent for assistance with transportation.

## 2023-11-25 NOTE — BHH Group Notes (Signed)
Spiritual care group on grief and loss facilitated by Chaplain Dyanne Carrel, B[facility]  Group Goal: Support / Education around grief and loss  Members engage in facilitated group support and psycho-social education.  Group Description:  Following introductions and group rules, group members engaged in facilitated group dialogue and support around topic of loss, with particular support around experiences of loss in their lives. Group Identified types of loss (relationships / self / things) and identified patterns, circumstances, and changes that precipitate losses. Reflected on thoughts / feelings around loss, normalized grief responses, and recognized variety in grief experience. Group encouraged individual reflection on safe space and on the coping skills that they are already utilizing.  Group drew on Adlerian / Rogerian and narrative framework  Patient Progress: Allison Moore attended group and actively engaged and participated in group conversation and activities.

## 2023-11-25 NOTE — Progress Notes (Signed)
     11/25/2023       9:05 AM   Tanise Braatz   Type of Note: [facility] ACTT  Pt was assessed by Orpah Melter with [facility] yesterday afternoon and was accepted for services using [employer]. Pt is happy with this service and is planned to discharge today.   Signed:  Jaymason Ledesma, LCSW-A 11/25/2023  9:05 AM

## 2023-11-25 NOTE — Plan of Care (Signed)
  Problem: Education: Goal: Emotional status will improve Outcome: Progressing   Problem: Education: Goal: Mental status will improve Outcome: Progressing   Problem: Activity: Goal: Interest or engagement in activities will improve Outcome: Progressing Goal: Sleeping patterns will improve Outcome: Progressing   Problem: Coping: Goal: Ability to verbalize frustrations and anger appropriately will improve Outcome: Progressing Goal: Ability to demonstrate self-control will improve Outcome: Progressing   Problem: Health Behavior/Discharge Planning: Goal: Identification of resources available to assist in meeting health care needs will improve Outcome: Progressing Goal: Compliance with treatment plan for underlying cause of condition will improve Outcome: Progressing   Problem: Safety: Goal: Periods of time without injury will increase Outcome: Progressing

## 2023-11-25 NOTE — BHH Suicide Risk Assessment (Signed)
[facility] Discharge Suicide Risk Assessment   Principal Problem: Schizoaffective disorder, depressive type [facility]) Discharge Diagnoses: Principal Problem:   Schizoaffective disorder, depressive type (HCC) Active Problems:   History of tobacco abuse   Bipolar 1 disorder (HCC)   Total Time spent with patient: 30 minutes  Musculoskeletal: Strength & Muscle Tone: within normal limits Gait & Station: unsteady Patient leans:  Mobilizes with a walker  Psychiatric Specialty Exam  Presentation  General Appearance:  Well-groomed, pleasant and polite, engage warmly.  No EPS.  Eye Contact: Good eye contact.   Speech: Spontaneous, normal rate, tone and volume.   Mood and Affect  Mood: Euthymic.   Affect: Full range and mood congruent.   Thought Process  Thought Processes: Linear and goal directed.   Descriptions of Associations: Intact.   Orientation: Oriented to self, place and time   Thought Content: Future-oriented.  No negative rumination.  No guilty rumination.  No suicidal thoughts.  No homicidal thoughts.  No thoughts of violence.  No delusional theme.   Hallucinations: No hallucination in any modality.  Sensorium  Memory: Good.   Judgment: Fair.   Insight: Good.   Executive Functions  Concentration: Good.   Attention Span: Good.   Recall: Good.   Fund of Knowledge: Limited.   Language: Good.   Psychomotor Activity  At baseline  Physical Exam: Physical Exam ROS Blood pressure 109/79, pulse 76, temperature 99.1 F (37.3 C), temperature source Oral, resp. rate 18, height 5\' 4"  (1.626 m), weight 101 kg, SpO2 100%. Body mass index is 38.22 kg/m.  Mental Status Per Nursing Assessment::   On Admission:  NA  Demographic Factors:  Living alone and Unemployed  Loss Factors: Decline in physical health  Historical Factors: Family history of mental illness or substance abuse  Risk Reduction Factors:   Positive therapeutic relationship and  Positive coping skills or problem solving skills  Continued Clinical Symptoms:  Psychosis and depression has resolved.  No overwhelming anxiety.  Cognitive Features That Contribute To Risk:  None    Suicide Risk:  Minimal: Patient is not currently suicidal.  Patient is not currently having any homicidal thoughts.  No thoughts of violence.  Modifiable risk factor targeted during this admission as psychosis and depression.  Patient has responded well to medication adjustment.  She is pleased with aftercare plan as she will have her own place in a hotel.  Her goal is to transition to her own apartment.  She is pleased with support structures initiated during this admission.  There are no new psychosocial stressors.  No use of alcohol or any psychoactive substance. At this point in time, patient is stable for care at the lower setting.  Follow-up Information     [facility]. Call.   Why: Please call this provider on 11/28/23 at 9:00AM to ensure services are set up for care. Contact information: [address] [facility] [place] [address] [phone]        [facility]. Go on 12/02/2023.   Why: You have a hospital follow up appointment for therapy and medication management services on 12/02/23 at 8:30 am, in person. Contact information: [address]  [address] [place] [address] [phone]         [facility], [facility] Follow up.   Why: You have been accepted for ACTT services through this provider. Contact information: [address] [place] [address] [phone]                 Plan Of  Care/Follow-up recommendations:  See discharge summary  Georgiann Cocker, MD 11/25/2023, 10:34 AM
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
## 2023-11-25 NOTE — Progress Notes (Signed)
  [facility] Adult Case Management Discharge Plan :  Will you be returning to the same living situation after discharge:  No. Pt will be discharged to [facility] in [place] at discharge At discharge, do you have transportation home?: Yes,  CSW arranged [employer] for 11:30AM Do you have the ability to pay for your medications: Yes,  pt has active Medicare health insurance  Release of information consent forms completed and in the chart;  Patient's signature needed at discharge.  Patient to Follow up at:  Follow-up Information     [facility]. Call.   Why: Please call this provider on 11/28/23 at 9:00AM to ensure services are set up for care. Contact information: [address] [facility] [place] [address] [phone]        [facility]. Go on 12/02/2023.   Why: You have a hospital follow up appointment for therapy and medication management services on 12/02/23 at 8:30 am, in person. Contact information: [address]  [address] [place] [address] [phone]         [facility] Follow up.   Why: You have been accepted for ACTT services through this provider. Contact information: [address] [place] [address] [phone]                 Next level of care provider has access to [facility] Link:no  Safety Planning and Suicide Prevention discussed: Valentino Hue   Mitzie Na (mom) [phone]     Has patient been referred to the Quitline?: Yes, referral faxed on 11/25/23  Patient has been referred for addiction treatment: No known substance use disorder.  [name], LCSWA 11/25/2023, 10:05 AM

## 2023-11-25 NOTE — Progress Notes (Signed)
Patient discharged to home via taxi. Discharge instructions, prescriptions, all required discharge documents and information about follow-up appointment given to pt with verbalization of understanding. All personal belongings returned to pt at time of discharge. Plan of Care and Education resolved. Pt escorted to lobby by RN at [phone].  11/25/23 0915  Psych Admission Type (Psych Patients Only)  Admission Status Voluntary  Psychosocial Assessment  Patient Complaints Anxiety  Eye Contact Fair  Facial Expression Flat  Affect Anxious  Speech Soft;Slow  Interaction Assertive  Motor Activity Slow  Appearance/Hygiene Unremarkable  Behavior Characteristics Cooperative;Appropriate to situation  Mood Anxious  Thought Process  Coherency WDL  Content WDL  Delusions None reported or observed  Perception WDL  Hallucination None reported or observed  Judgment Impaired  Confusion None  Danger to Self  Current suicidal ideation? Denies  Self-Injurious Behavior No self-injurious ideation or behavior indicators observed or expressed   Agreement Not to Harm Self Yes  Description of Agreement Verbal  Danger to Others  Danger to Others None reported or observed

## 2023-11-25 NOTE — Care Management Important Message (Signed)
 Medicare IM printed and given to CSW to give to the patient.

## 2023-11-25 NOTE — [facility] Group Notes (Signed)
Spiritual care group facilitated by Chaplain Dyanne Carrel, [facility]  Group focused on topic of strength. Group members reflected on what thoughts and feelings emerge when they hear this topic. They then engaged in facilitated dialog around how strength is present in their lives. This dialog focused on representing what strength had been to them in their lives (images and patterns given) and what they saw as helpful in their life now (what they needed / wanted).  Activity drew on narrative framework.  Patient Progress: Allison Moore attended group and actively engaged and participated in group conversation and activities.

## 2023-11-25 NOTE — Care Management Important Message (Signed)
 Patient informed of right to appeal discharge, provided phone number to [facility]. Patient expressed no interest in appealing discharge at this time. CSW will continue to monitor situation.

## 2023-12-07 DIAGNOSIS — E782 Mixed hyperlipidemia: Secondary | ICD-10-CM | POA: Diagnosis not present

## 2023-12-07 DIAGNOSIS — G35 Multiple sclerosis: Secondary | ICD-10-CM | POA: Diagnosis not present

## 2023-12-07 DIAGNOSIS — Z720 Tobacco use: Secondary | ICD-10-CM | POA: Diagnosis not present

## 2024-01-30 DIAGNOSIS — G35 Multiple sclerosis: Secondary | ICD-10-CM | POA: Diagnosis not present

## 2024-01-30 DIAGNOSIS — Z720 Tobacco use: Secondary | ICD-10-CM | POA: Diagnosis not present

## 2024-01-30 DIAGNOSIS — E782 Mixed hyperlipidemia: Secondary | ICD-10-CM | POA: Diagnosis not present

## 2024-01-30 DIAGNOSIS — D75839 Thrombocytosis, unspecified: Secondary | ICD-10-CM | POA: Diagnosis not present

## 2024-02-28 DIAGNOSIS — G35 Multiple sclerosis: Secondary | ICD-10-CM | POA: Diagnosis not present

## 2024-02-28 DIAGNOSIS — E782 Mixed hyperlipidemia: Secondary | ICD-10-CM | POA: Diagnosis not present

## 2024-02-28 DIAGNOSIS — Z0000 Encounter for general adult medical examination without abnormal findings: Secondary | ICD-10-CM | POA: Diagnosis not present

## 2024-02-28 DIAGNOSIS — Z720 Tobacco use: Secondary | ICD-10-CM | POA: Diagnosis not present

## 2024-03-16 DIAGNOSIS — G35 Multiple sclerosis: Secondary | ICD-10-CM | POA: Diagnosis not present

## 2024-03-16 DIAGNOSIS — G43009 Migraine without aura, not intractable, without status migrainosus: Secondary | ICD-10-CM | POA: Diagnosis not present

## 2024-03-29 ENCOUNTER — Encounter: Admitting: Obstetrics and Gynecology

## 2024-04-04 DIAGNOSIS — R2681 Unsteadiness on feet: Secondary | ICD-10-CM | POA: Diagnosis not present

## 2024-04-04 DIAGNOSIS — R29898 Other symptoms and signs involving the musculoskeletal system: Secondary | ICD-10-CM | POA: Diagnosis not present

## 2024-04-04 DIAGNOSIS — G35 Multiple sclerosis: Secondary | ICD-10-CM | POA: Diagnosis not present

## 2024-04-10 DIAGNOSIS — R29898 Other symptoms and signs involving the musculoskeletal system: Secondary | ICD-10-CM | POA: Diagnosis not present

## 2024-04-10 DIAGNOSIS — R2681 Unsteadiness on feet: Secondary | ICD-10-CM | POA: Diagnosis not present

## 2024-04-10 DIAGNOSIS — G35 Multiple sclerosis: Secondary | ICD-10-CM | POA: Diagnosis not present

## 2024-04-11 NOTE — Progress Notes (Signed)
Kesimpta

## 2024-04-12 DIAGNOSIS — R29898 Other symptoms and signs involving the musculoskeletal system: Secondary | ICD-10-CM | POA: Diagnosis not present

## 2024-04-12 DIAGNOSIS — R2681 Unsteadiness on feet: Secondary | ICD-10-CM | POA: Diagnosis not present

## 2024-04-12 DIAGNOSIS — G35 Multiple sclerosis: Secondary | ICD-10-CM | POA: Diagnosis not present

## 2024-04-19 DIAGNOSIS — R2681 Unsteadiness on feet: Secondary | ICD-10-CM | POA: Diagnosis not present

## 2024-04-19 DIAGNOSIS — R29898 Other symptoms and signs involving the musculoskeletal system: Secondary | ICD-10-CM | POA: Diagnosis not present

## 2024-04-19 DIAGNOSIS — G35 Multiple sclerosis: Secondary | ICD-10-CM | POA: Diagnosis not present

## 2024-05-02 NOTE — Telephone Encounter (Signed)
 Should go to ER. Can stop Kesimpta but may not be from that. We will discuss next visit.

## 2024-05-02 NOTE — Telephone Encounter (Signed)
 Due to the nature of the pt concern, call was transferred to triage RN.  Jamie in access scheduling for OP Neurology was on the line. She stated that the pt told her that she was having CP, coughing, congestion, and had fallen x 6. Pt was taken off hold and transferred.  Confirmed name and DOB.  Pt stated that she began having CP yesterday. She seemed SOB with speaking and she confirmed that it was difficult for her to breathe. She seemed stuffy and congested also. She stated that her back was hurting badly. Instructed her to go to ED now and if she didn't have someone to take her, she should call 911. Pt stated that she didn't want to go to the ED, but she was strongly urged to go because this could be life-threatening, especially since heart attacks present differently in women than men. She stated she was going to call her PCP. I told her that they would tell her the same thing, to go to the ED. I told her that once she tells the ED check-in that she's having CP and SOB, she would be moved to the front of the line and not have to wait hours in the ED. She stated that she could not even get out of bed and would get her coworker to take her. She also stated that she was not taking the Kesimpta anymore since she felt that her symptoms were related to that. Informed her that Dr. Scot would be informed.

## 2024-05-03 ENCOUNTER — Emergency Department (HOSPITAL_COMMUNITY)

## 2024-05-03 ENCOUNTER — Encounter (HOSPITAL_COMMUNITY): Payer: Self-pay | Admitting: Emergency Medicine

## 2024-05-03 ENCOUNTER — Inpatient Hospital Stay (HOSPITAL_COMMUNITY)
Admission: EM | Admit: 2024-05-03 | Discharge: 2024-05-11 | DRG: 871 | Disposition: A | Discharge status: Home / Self Care | Attending: Internal Medicine | Admitting: Internal Medicine

## 2024-05-03 ENCOUNTER — Other Ambulatory Visit: Payer: Self-pay

## 2024-05-03 DIAGNOSIS — Z560 Unemployment, unspecified: Secondary | ICD-10-CM

## 2024-05-03 DIAGNOSIS — E785 Hyperlipidemia, unspecified: Secondary | ICD-10-CM | POA: Diagnosis present

## 2024-05-03 DIAGNOSIS — E66812 Obesity, class 2: Secondary | ICD-10-CM | POA: Diagnosis present

## 2024-05-03 DIAGNOSIS — J189 Pneumonia, unspecified organism: Secondary | ICD-10-CM | POA: Diagnosis present

## 2024-05-03 DIAGNOSIS — J324 Chronic pansinusitis: Secondary | ICD-10-CM | POA: Diagnosis present

## 2024-05-03 DIAGNOSIS — J9811 Atelectasis: Secondary | ICD-10-CM | POA: Diagnosis not present

## 2024-05-03 DIAGNOSIS — R0603 Acute respiratory distress: Secondary | ICD-10-CM

## 2024-05-03 DIAGNOSIS — R509 Fever, unspecified: Secondary | ICD-10-CM | POA: Diagnosis not present

## 2024-05-03 DIAGNOSIS — J690 Pneumonitis due to inhalation of food and vomit: Secondary | ICD-10-CM | POA: Diagnosis present

## 2024-05-03 DIAGNOSIS — Z6838 Body mass index (BMI) 38.0-38.9, adult: Secondary | ICD-10-CM

## 2024-05-03 DIAGNOSIS — R1314 Dysphagia, pharyngoesophageal phase: Secondary | ICD-10-CM | POA: Diagnosis present

## 2024-05-03 DIAGNOSIS — Z1152 Encounter for screening for COVID-19: Secondary | ICD-10-CM

## 2024-05-03 DIAGNOSIS — E860 Dehydration: Secondary | ICD-10-CM | POA: Diagnosis not present

## 2024-05-03 DIAGNOSIS — R0989 Other specified symptoms and signs involving the circulatory and respiratory systems: Secondary | ICD-10-CM | POA: Diagnosis not present

## 2024-05-03 DIAGNOSIS — R918 Other nonspecific abnormal finding of lung field: Secondary | ICD-10-CM | POA: Diagnosis not present

## 2024-05-03 DIAGNOSIS — R9089 Other abnormal findings on diagnostic imaging of central nervous system: Secondary | ICD-10-CM | POA: Diagnosis not present

## 2024-05-03 DIAGNOSIS — E7800 Pure hypercholesterolemia, unspecified: Secondary | ICD-10-CM | POA: Diagnosis present

## 2024-05-03 DIAGNOSIS — R1319 Other dysphagia: Secondary | ICD-10-CM

## 2024-05-03 DIAGNOSIS — Z881 Allergy status to other antibiotic agents status: Secondary | ICD-10-CM | POA: Diagnosis not present

## 2024-05-03 DIAGNOSIS — K056 Periodontal disease, unspecified: Secondary | ICD-10-CM | POA: Diagnosis present

## 2024-05-03 DIAGNOSIS — F419 Anxiety disorder, unspecified: Secondary | ICD-10-CM | POA: Diagnosis present

## 2024-05-03 DIAGNOSIS — J0140 Acute pansinusitis, unspecified: Secondary | ICD-10-CM | POA: Diagnosis present

## 2024-05-03 DIAGNOSIS — R296 Repeated falls: Secondary | ICD-10-CM | POA: Diagnosis present

## 2024-05-03 DIAGNOSIS — M4802 Spinal stenosis, cervical region: Secondary | ICD-10-CM | POA: Diagnosis not present

## 2024-05-03 DIAGNOSIS — G35 Multiple sclerosis: Secondary | ICD-10-CM | POA: Diagnosis not present

## 2024-05-03 DIAGNOSIS — R6520 Severe sepsis without septic shock: Secondary | ICD-10-CM | POA: Diagnosis present

## 2024-05-03 DIAGNOSIS — Z5941 Food insecurity: Secondary | ICD-10-CM

## 2024-05-03 DIAGNOSIS — H6092 Unspecified otitis externa, left ear: Secondary | ICD-10-CM | POA: Diagnosis not present

## 2024-05-03 DIAGNOSIS — I10 Essential (primary) hypertension: Secondary | ICD-10-CM | POA: Diagnosis not present

## 2024-05-03 DIAGNOSIS — F251 Schizoaffective disorder, depressive type: Secondary | ICD-10-CM | POA: Diagnosis present

## 2024-05-03 DIAGNOSIS — R590 Localized enlarged lymph nodes: Secondary | ICD-10-CM | POA: Diagnosis not present

## 2024-05-03 DIAGNOSIS — H6691 Otitis media, unspecified, right ear: Secondary | ICD-10-CM | POA: Diagnosis not present

## 2024-05-03 DIAGNOSIS — E041 Nontoxic single thyroid nodule: Secondary | ICD-10-CM | POA: Diagnosis not present

## 2024-05-03 DIAGNOSIS — Z79899 Other long term (current) drug therapy: Secondary | ICD-10-CM | POA: Diagnosis not present

## 2024-05-03 DIAGNOSIS — S0990XA Unspecified injury of head, initial encounter: Secondary | ICD-10-CM | POA: Diagnosis not present

## 2024-05-03 DIAGNOSIS — R531 Weakness: Secondary | ICD-10-CM | POA: Diagnosis not present

## 2024-05-03 DIAGNOSIS — W19XXXA Unspecified fall, initial encounter: Secondary | ICD-10-CM | POA: Diagnosis not present

## 2024-05-03 DIAGNOSIS — F17210 Nicotine dependence, cigarettes, uncomplicated: Secondary | ICD-10-CM | POA: Diagnosis not present

## 2024-05-03 DIAGNOSIS — G9341 Metabolic encephalopathy: Secondary | ICD-10-CM | POA: Diagnosis not present

## 2024-05-03 DIAGNOSIS — H6090 Unspecified otitis externa, unspecified ear: Secondary | ICD-10-CM

## 2024-05-03 DIAGNOSIS — J322 Chronic ethmoidal sinusitis: Secondary | ICD-10-CM | POA: Diagnosis not present

## 2024-05-03 DIAGNOSIS — A419 Sepsis, unspecified organism: Secondary | ICD-10-CM | POA: Diagnosis not present

## 2024-05-03 DIAGNOSIS — R569 Unspecified convulsions: Secondary | ICD-10-CM | POA: Diagnosis not present

## 2024-05-03 DIAGNOSIS — R32 Unspecified urinary incontinence: Secondary | ICD-10-CM | POA: Diagnosis present

## 2024-05-03 DIAGNOSIS — G959 Disease of spinal cord, unspecified: Secondary | ICD-10-CM | POA: Diagnosis not present

## 2024-05-03 DIAGNOSIS — R0602 Shortness of breath: Secondary | ICD-10-CM | POA: Diagnosis not present

## 2024-05-03 DIAGNOSIS — S0231XA Fracture of orbital floor, right side, initial encounter for closed fracture: Secondary | ICD-10-CM | POA: Diagnosis present

## 2024-05-03 DIAGNOSIS — R519 Headache, unspecified: Secondary | ICD-10-CM | POA: Diagnosis not present

## 2024-05-03 DIAGNOSIS — I6782 Cerebral ischemia: Secondary | ICD-10-CM | POA: Diagnosis not present

## 2024-05-03 DIAGNOSIS — R1310 Dysphagia, unspecified: Secondary | ICD-10-CM

## 2024-05-03 DIAGNOSIS — J329 Chronic sinusitis, unspecified: Secondary | ICD-10-CM | POA: Diagnosis not present

## 2024-05-03 DIAGNOSIS — J9601 Acute respiratory failure with hypoxia: Secondary | ICD-10-CM | POA: Diagnosis not present

## 2024-05-03 DIAGNOSIS — R159 Full incontinence of feces: Secondary | ICD-10-CM | POA: Diagnosis present

## 2024-05-03 DIAGNOSIS — E669 Obesity, unspecified: Secondary | ICD-10-CM | POA: Diagnosis present

## 2024-05-03 DIAGNOSIS — D84821 Immunodeficiency due to drugs: Secondary | ICD-10-CM | POA: Diagnosis present

## 2024-05-03 DIAGNOSIS — H6091 Unspecified otitis externa, right ear: Secondary | ICD-10-CM | POA: Diagnosis not present

## 2024-05-03 DIAGNOSIS — R000 Tachycardia, unspecified: Secondary | ICD-10-CM | POA: Diagnosis not present

## 2024-05-03 DIAGNOSIS — Z7960 Long term (current) use of unspecified immunomodulators and immunosuppressants: Secondary | ICD-10-CM

## 2024-05-03 DIAGNOSIS — M50022 Cervical disc disorder at C5-C6 level with myelopathy: Secondary | ICD-10-CM | POA: Diagnosis not present

## 2024-05-03 LAB — CBC WITH DIFFERENTIAL/PLATELET
Abs Immature Granulocytes: 0.2 K/uL — ABNORMAL HIGH (ref 0.00–0.07)
Basophils Absolute: 0 K/uL (ref 0.0–0.1)
Basophils Relative: 0 %
Eosinophils Absolute: 0 K/uL (ref 0.0–0.5)
Eosinophils Relative: 0 %
HCT: 40.6 % (ref 36.0–46.0)
Hemoglobin: 13.4 g/dL (ref 12.0–15.0)
Immature Granulocytes: 1 %
Lymphocytes Relative: 2 %
Lymphs Abs: 0.3 K/uL — ABNORMAL LOW (ref 0.7–4.0)
MCH: 29.1 pg (ref 26.0–34.0)
MCHC: 33 g/dL (ref 30.0–36.0)
MCV: 88.3 fL (ref 80.0–100.0)
Monocytes Absolute: 0.8 K/uL (ref 0.1–1.0)
Monocytes Relative: 5 %
Neutro Abs: 16 K/uL — ABNORMAL HIGH (ref 1.7–7.7)
Neutrophils Relative %: 92 %
Platelets: 254 K/uL (ref 150–400)
RBC: 4.6 MIL/uL (ref 3.87–5.11)
RDW: 13.1 % (ref 11.5–15.5)
WBC: 17.4 K/uL — ABNORMAL HIGH (ref 4.0–10.5)
nRBC: 0 % (ref 0.0–0.2)

## 2024-05-03 LAB — I-STAT CHEM 8, ED
BUN: 11 mg/dL (ref 6–20)
Calcium, Ion: 1.16 mmol/L (ref 1.15–1.40)
Chloride: 100 mmol/L (ref 98–111)
Creatinine, Ser: 1 mg/dL (ref 0.44–1.00)
Glucose, Bld: 115 mg/dL — ABNORMAL HIGH (ref 70–99)
HCT: 42 % (ref 36.0–46.0)
Hemoglobin: 14.3 g/dL (ref 12.0–15.0)
Potassium: 3.7 mmol/L (ref 3.5–5.1)
Sodium: 137 mmol/L (ref 135–145)
TCO2: 24 mmol/L (ref 22–32)

## 2024-05-03 LAB — RESP PANEL BY RT-PCR (RSV, FLU A&B, COVID)  RVPGX2
Influenza A by PCR: NEGATIVE
Influenza B by PCR: NEGATIVE
Resp Syncytial Virus by PCR: NEGATIVE
SARS Coronavirus 2 by RT PCR: NEGATIVE

## 2024-05-03 LAB — COMPREHENSIVE METABOLIC PANEL WITH GFR
ALT: 40 U/L (ref 0–44)
AST: 43 U/L — ABNORMAL HIGH (ref 15–41)
Albumin: 3.6 g/dL (ref 3.5–5.0)
Alkaline Phosphatase: 66 U/L (ref 38–126)
Anion gap: 11 (ref 5–15)
BUN: 11 mg/dL (ref 6–20)
CO2: 24 mmol/L (ref 22–32)
Calcium: 9.4 mg/dL (ref 8.9–10.3)
Chloride: 100 mmol/L (ref 98–111)
Creatinine, Ser: 0.98 mg/dL (ref 0.44–1.00)
GFR, Estimated: >60 mL/min (ref >60)
Glucose, Bld: 119 mg/dL — ABNORMAL HIGH (ref 70–99)
Potassium: 3.8 mmol/L (ref 3.5–5.1)
Sodium: 135 mmol/L (ref 135–145)
Total Bilirubin: 1.2 mg/dL (ref 0.0–1.2)
Total Protein: 7.7 g/dL (ref 6.5–8.1)

## 2024-05-03 LAB — I-STAT CG4 LACTIC ACID, ED: Lactic Acid, Venous: 1.2 mmol/L (ref 0.5–1.9)

## 2024-05-03 LAB — HCG, SERUM, QUALITATIVE: Preg, Serum: NEGATIVE

## 2024-05-03 LAB — PROTIME-INR
INR: 1.1 (ref 0.8–1.2)
Prothrombin Time: 14.9 s (ref 11.4–15.2)

## 2024-05-03 LAB — TROPONIN I (HIGH SENSITIVITY): Troponin I (High Sensitivity): 14 ng/L (ref <18)

## 2024-05-03 LAB — PROCALCITONIN: Procalcitonin: 1.97 ng/mL

## 2024-05-03 MED ORDER — GUAIFENESIN ER 600 MG PO TB12
600.0000 mg | ORAL_TABLET | Freq: Two times a day (BID) | ORAL | Status: DC
  Administered 2024-05-03 – 2024-05-11 (×13): 600 mg via ORAL
  Filled 2024-05-03 (×13): qty 1

## 2024-05-03 MED ORDER — SODIUM CHLORIDE 0.9 % IV BOLUS
1000.0000 mL | Freq: Once | INTRAVENOUS | Status: AC
  Administered 2024-05-03: 1000 mL via INTRAVENOUS

## 2024-05-03 MED ORDER — RISPERIDONE 3 MG PO TABS
3.0000 mg | ORAL_TABLET | Freq: Every day | ORAL | Status: DC
  Administered 2024-05-03 – 2024-05-10 (×7): 3 mg via ORAL
  Filled 2024-05-03 (×8): qty 1

## 2024-05-03 MED ORDER — LACTATED RINGERS IV BOLUS
500.0000 mL | Freq: Once | INTRAVENOUS | Status: AC
  Administered 2024-05-03: 500 mL via INTRAVENOUS

## 2024-05-03 MED ORDER — LEVOFLOXACIN IN D5W 750 MG/150ML IV SOLN
750.0000 mg | Freq: Once | INTRAVENOUS | Status: AC
  Administered 2024-05-03: 750 mg via INTRAVENOUS
  Filled 2024-05-03: qty 150

## 2024-05-03 MED ORDER — LACTATED RINGERS IV SOLN: INTRAVENOUS | Status: DC

## 2024-05-03 MED ORDER — KETOROLAC TROMETHAMINE 30 MG/ML IJ SOLN
30.0000 mg | Freq: Once | INTRAMUSCULAR | Status: AC
  Administered 2024-05-03: 30 mg via INTRAVENOUS
  Filled 2024-05-03: qty 1

## 2024-05-03 MED ORDER — ACETAMINOPHEN 650 MG RE SUPP
650.0000 mg | Freq: Four times a day (QID) | RECTAL | Status: DC | PRN
  Administered 2024-05-04: 650 mg via RECTAL
  Filled 2024-05-03 (×2): qty 1

## 2024-05-03 MED ORDER — VANCOMYCIN HCL IN DEXTROSE 750-5 MG/150ML-% IV SOLN
750.0000 mg | Freq: Two times a day (BID) | INTRAVENOUS | Status: DC
  Filled 2024-05-03: qty 150

## 2024-05-03 MED ORDER — HYDROXYZINE HCL 25 MG PO TABS: 25.0000 mg | ORAL_TABLET | Freq: Three times a day (TID) | ORAL | Status: DC | PRN

## 2024-05-03 MED ORDER — SODIUM CHLORIDE 0.9 % IV SOLN
2.0000 g | Freq: Three times a day (TID) | INTRAVENOUS | Status: DC
  Administered 2024-05-03 – 2024-05-04 (×2): 2 g via INTRAVENOUS
  Filled 2024-05-03 (×3): qty 12.5

## 2024-05-03 MED ORDER — ACETAMINOPHEN 325 MG PO TABS
650.0000 mg | ORAL_TABLET | Freq: Four times a day (QID) | ORAL | Status: DC | PRN
  Administered 2024-05-03 – 2024-05-04 (×2): 650 mg via ORAL
  Filled 2024-05-03 (×2): qty 2

## 2024-05-03 MED ORDER — SERTRALINE HCL 50 MG PO TABS
150.0000 mg | ORAL_TABLET | Freq: Every day | ORAL | Status: DC
  Administered 2024-05-06 – 2024-05-11 (×6): 150 mg via ORAL
  Filled 2024-05-03 (×6): qty 1

## 2024-05-03 MED ORDER — ATORVASTATIN CALCIUM 10 MG PO TABS
20.0000 mg | ORAL_TABLET | Freq: Every day | ORAL | Status: DC
  Administered 2024-05-03 – 2024-05-10 (×7): 20 mg via ORAL
  Filled 2024-05-03 (×7): qty 2

## 2024-05-03 MED ORDER — ALBUTEROL SULFATE (2.5 MG/3ML) 0.083% IN NEBU
2.5000 mg | INHALATION_SOLUTION | Freq: Four times a day (QID) | RESPIRATORY_TRACT | Status: DC | PRN
  Administered 2024-05-04: 2.5 mg via RESPIRATORY_TRACT
  Filled 2024-05-03: qty 3

## 2024-05-03 MED ORDER — LEVOFLOXACIN IN D5W 750 MG/150ML IV SOLN: 750.0000 mg | INTRAVENOUS | Status: DC

## 2024-05-03 MED ORDER — ADULT MULTIVITAMIN W/MINERALS CH
1.0000 | ORAL_TABLET | Freq: Every day | ORAL | Status: DC
  Administered 2024-05-06 – 2024-05-11 (×6): 1 via ORAL
  Filled 2024-05-03 (×6): qty 1

## 2024-05-03 MED ORDER — GABAPENTIN 300 MG PO CAPS
300.0000 mg | ORAL_CAPSULE | Freq: Three times a day (TID) | ORAL | Status: DC
  Administered 2024-05-03 – 2024-05-11 (×19): 300 mg via ORAL
  Filled 2024-05-03 (×19): qty 1

## 2024-05-03 MED ORDER — VANCOMYCIN HCL 10 G IV SOLR
2000.0000 mg | INTRAVENOUS | Status: AC
  Administered 2024-05-03: 2000 mg via INTRAVENOUS
  Filled 2024-05-03: qty 40

## 2024-05-03 NOTE — ED Notes (Signed)
 5w notified of pt coming to their unit

## 2024-05-03 NOTE — Progress Notes (Signed)
 Pharmacy Antibiotic Note  Allison Moore is a 43 y.o. female admitted on 05/03/2024 with fall and fever.  Pharmacy has been consulted for vancomycin  and cefepime  dosing for sepsis from PNA.  Noted rash with Keflex use.  SCr 1, WBC 17.4, PCT 1.9.  Plan: Vanc 2gm IV x 1, then 750mg  IV Q12H for AUC 532 using SCr 1 Cefepime  2gm IV Q8H Monitor renal fxn, clinical progress, vanc levels as indicated  Height: 5' 4 (162.6 cm) Weight: 101 kg (222 lb 10.6 oz) IBW/kg (Calculated) : 54.7  Temp (24hrs), Avg:102.8 F (39.3 C), Min:100.3 F (37.9 C), Max:104.9 F (40.5 C)  Recent Labs  Lab 05/03/24 1118 05/03/24 1203 05/03/24 1204  WBC 17.4*  --   --   CREATININE 0.98 1.00  --   LATICACIDVEN  --   --  1.2    Estimated Creatinine Clearance: 83 mL/min (by C-G formula based on SCr of 1 mg/dL).    Allergies  Allergen Reactions   Norco [Hydrocodone -Acetaminophen ] Other (See Comments)    Confusion   Pork (Diagnostic) Other (See Comments)    Per pt, contraindicated due to MS   Keflex [Cephalexin] Rash    LVQ x1 7/10 Vanc 7/10 >> Cefepime  7/10 >>   7/10 BCx -  7/10 sputum -   Claudio Mondry D. [place], PharmD, BCPS, BCCCP 05/03/2024, 10:18 PM

## 2024-05-03 NOTE — Progress Notes (Signed)
 Responded to page fall on Thinners. Provided emotional and spiritual support.  Chaplain available as needed.  Rayleen Dade, [place], [facility], Pager [phone]

## 2024-05-03 NOTE — Progress Notes (Addendum)
 [facility] night cross cover note:   I was notified by the patient's RN  of a red mews score in this patient with a history of multiple sclerosis, who was admitted earlier today for severe sepsis due to community-acquired pneumonia, for which she is currently receiving Levaquin .  Most recent vital signs were notable for the following: Temperature 100.3, which is down from presenting temperature of 104.9 following interval doses of acetaminophen , Toradol .  Heart rates in the 1 teens, blood pressure 88/50, which is slightly lower than systolic blood pressures in the 1 teens during day shift.  Respiratory rate in the 20s and oxygen saturation 98 to 99% on room air.   Who presented chest x-ray showed evidence of bibasilar airspace opacities concerning for pneumonia.  CBC was notable for white blood cell count 17,400.  Presenting lactic acid was nonelevated at 1.2.  At admission, blood cultures x 2 were collected and the patient was started on Levaquin  for suspected community-acquired pneumonia.  Procalcitonin level was noted to be 1.97.  Urinalysis is currently pending.  She received 2 L of normal saline earlier today.  Per my discussions with the patient's RN, the patient is no longer altered, but remains somnolent.  In the setting of her severe sepsis, now with soft blood pressure, I broaden the spectrum of antibiotic coverage for suspected pneumonia by adding IV vancomycin  and cefepime , with levaquin  serving as atypical coverage, after discussing with our pharmacist the potential neurotoxic implications of these abx in the setting of the patient's history of multiple sclerosis.   Additionally, I ordered a 500 cc LR bolus followed by resumption of continuous LR at 150 cc/hr.   Will also continue to follow for result of UA. I've also ordered a mag level.    Update: BP has improved with the above IVF, with SBP's now into the low 100's mmHg. However, tachycardia now slightly worse and she remains tachypneic.  O2 sats remain in the high 90's on RA, and no additional fever. No new complaints. HR remains elevated while patient is sleep. While these VS may well be consistent with presenting severe sepsis due to pna, will also evaluate for any e/o volume overload given that HR appears to have increased while receiving the above ivf's. As such, I've ordered updated cxr as well as bnp. In the meantime, I've reduced rate of ivf's down to 125 cc/hr. No prior echo on file. Will follow for result of mag level as well, and I have added a TSH level in addition to UDS. UA pending.  She has an order for prn albuterol  nebulizer treatments, but does not appear to have received any treatments of such.      Allison Pore, DO Hospitalist

## 2024-05-03 NOTE — ED Provider Notes (Signed)
 [facility] AT [facility] Provider Note   CSN: [id number] Arrival date & time: 05/03/24  1112     Patient presents with: Fall and Fever   Allison Moore is a 43 y.o. female w/ hx of MS presenting to ED by EMS with weakness, fall, head injury, AMS.  Patient fell today when legs gave out on her, striking her head. She lives alone in a small apartment.  She had diarrhea and fecal incontinence per EMS.  EMS reports unable to get temp but patient felt very warm, was tachycardic and tachypneic.    Patient called neurology office yesterday per my review of external records complaining of difficulty breathing, congestion, back pain.   HPI     Prior to Admission medications   Medication Sig Start Date End Date Taking? Authorizing Provider  albuterol  (VENTOLIN  HFA) 108 (90 Base) MCG/ACT inhaler Inhale 2 puffs into the lungs every 6 (six) hours as needed for shortness of breath or wheezing. 08/08/23   [provider]  atorvastatin  (LIPITOR) 20 MG tablet Take 1 tablet (20 mg total) by mouth at bedtime. For high cholesterol 11/25/23   Nwoko, Mac I, NP  benzocaine  (ORAJEL) 10 % mucosal gel Use as directed in the mouth or throat 3 (three) times daily as needed for mouth pain. 11/25/23   Collene Mac I, NP  gabapentin  (NEURONTIN ) 300 MG capsule Take 1 capsule (300 mg total) by mouth 3 (three) times daily. For pain/anxiety 11/25/23   Collene Mac I, NP  hydrOXYzine  (ATARAX ) 25 MG tablet Take 1 tablet (25 mg total) by mouth 3 (three) times daily as needed for anxiety. 11/25/23   Collene Mac I, NP  ibuprofen  (ADVIL ) 600 MG tablet Take 1 tablet (600 mg total) by mouth every 6 (six) hours as needed. (May buy from over the counter): For pain 11/25/23   Collene Mac I, NP  nicotine  (NICODERM CQ  - DOSED IN MG/24 HOURS) 21 mg/24hr patch Place 1 patch (21 mg total) onto the skin daily. (May buy from over the counter): For smoking cessation. 11/26/23   Collene Mac I, NP   risperiDONE  (RISPERDAL ) 3 MG tablet Take 1 tablet (3 mg total) by mouth at bedtime. For mood control 11/25/23   Collene Mac I, NP  sertraline  (ZOLOFT ) 100 MG tablet Take 1 tablet (100 mg total) by mouth daily. For depression 11/26/23   Collene Mac I, NP  traZODone  (DESYREL ) 50 MG tablet Take 1 tablet (50 mg total) by mouth at bedtime as needed for sleep. 11/25/23   Collene Mac I, NP  vitamin D3 (CHOLECALCIFEROL ) 25 MCG tablet Take 2 tablets (2,000 Units total) by mouth daily. For vit. D replacement 11/26/23   Collene Mac I, NP  fluticasone  (FLOVENT  DISKUS) 50 MCG/BLIST diskus inhaler Inhale into the lungs. Patient not taking: Reported on 05/05/2020  06/20/20  [provider]  trihexyphenidyl (ARTANE) 2 MG tablet Take 2 mg by mouth daily. 1/2 tablet at bedtime Patient not taking: Reported on 05/05/2020  06/20/20  [provider]    Allergies: Norco [hydrocodone -acetaminophen ], Cephalexin, Hydrocodone -acetaminophen , and Pork-derived products    Review of Systems  Updated Vital Signs BP 116/77   Pulse (!) 130   Temp (!) 104.9 F (40.5 C) (Oral)   Resp (!) 24   Ht 5' 4 (1.626 m)   Wt 101 kg   SpO2 100%   BMI 38.22 kg/m   Physical Exam Constitutional:      General: She is not in acute distress. HENT:  Head: Normocephalic and atraumatic.  Eyes:     Conjunctiva/sclera: Conjunctivae normal.     Pupils: Pupils are equal, round, and reactive to light.  Cardiovascular:     Rate and Rhythm: Regular rhythm. Tachycardia present.  Pulmonary:     Effort: Pulmonary effort is normal. No respiratory distress.     Comments: Rhonchi on breath sounds, tachypneic, 93% on 2L  Abdominal:     General: There is no distension.     Tenderness: There is no abdominal tenderness.  Skin:    General: Skin is warm and dry.  Neurological:     General: No focal deficit present.     Mental Status: She is alert. Mental status is at baseline.  Psychiatric:        Mood and Affect: Mood  normal.        Behavior: Behavior normal.     (all labs ordered are listed, but only abnormal results are displayed) Labs Reviewed  COMPREHENSIVE METABOLIC PANEL WITH GFR - Abnormal; Notable for the following components:      Result Value   Glucose, Bld 119 (*)    AST 43 (*)    All other components within normal limits  CBC WITH DIFFERENTIAL/PLATELET - Abnormal; Notable for the following components:   WBC 17.4 (*)    Neutro Abs 16.0 (*)    Lymphs Abs 0.3 (*)    Abs Immature Granulocytes 0.20 (*)    All other components within normal limits  I-STAT CHEM 8, ED - Abnormal; Notable for the following components:   Glucose, Bld 115 (*)    All other components within normal limits  RESP PANEL BY RT-PCR (RSV, FLU A&B, COVID)  RVPGX2  CULTURE, BLOOD (ROUTINE X 2)  CULTURE, BLOOD (ROUTINE X 2)  PROTIME-INR  HCG, SERUM, QUALITATIVE  URINALYSIS, W/ REFLEX TO CULTURE (INFECTION SUSPECTED)  I-STAT CG4 LACTIC ACID, ED  TROPONIN I (HIGH SENSITIVITY)    EKG: EKG Interpretation Date/Time:  Thursday May 03 2024 11:49:34 EDT Ventricular Rate:  129 PR Interval:  158 QRS Duration:  87 QT Interval:  295 QTC Calculation: 433 R Axis:   93  Text Interpretation: Sinus tachycardia Borderline right axis deviation Borderline low voltage, extremity leads Confirmed by Cottie Cough [phone]) on 05/03/2024 11:55:26 AM  Radiology: CT Head Wo Contrast Result Date: 05/03/2024 CLINICAL DATA:  Trauma EXAM: CT HEAD WITHOUT CONTRAST TECHNIQUE: Contiguous axial images were obtained from the base of the skull through the vertex without intravenous contrast. RADIATION DOSE REDUCTION: This exam was performed according to the departmental dose-optimization program which includes automated exposure control, adjustment of the mA and/or kV according to patient size and/or use of iterative reconstruction technique. COMPARISON:  MRI head 11/19/2023. FINDINGS: Brain: No acute intracranial hemorrhage. No CT evidence of  acute infarct. Nonspecific hypoattenuation in the periventricular and subcortical white matter favored to reflect chronic microvascular ischemic changes. No edema, mass effect, or midline shift. The basilar cisterns are patent. Ventricles: The ventricles are normal. Vascular: No hyperdense vessel or unexpected calcification. Skull: No acute or aggressive finding. Orbits: Orbits are symmetric. Sinuses: Mucosal thickening throughout the paranasal sinuses particularly in the ethmoid sinuses, right greater than left. Mucosal thickening in the right frontal sinus. Mucosal thickening bilateral maxillary sinuses. Other: Trace fluid in the left mastoid air cells. IMPRESSION: No CT evidence of acute intracranial abnormality. White matter hypoattenuation which may reflect demyelinating process. Paranasal sinus disease as above. Electronically Signed   By: Cough Mania M.D.   On: 05/03/2024 11:51  DG Chest Portable 1 View Result Date: 05/03/2024 CLINICAL DATA:  Shortness of breath. EXAM: PORTABLE CHEST 1 VIEW COMPARISON:  Chest radiograph dated 11/13/2022. FINDINGS: Shallow inspiration with bibasilar atelectasis. No focal consolidation, pleural effusion or pneumothorax. No acute osseous pathology. IMPRESSION: Shallow inspiration with bibasilar atelectasis. Electronically Signed   By: Vanetta Chou M.D.   On: 05/03/2024 11:39     Procedures   Medications Ordered in the ED  lactated ringers  infusion ( Intravenous New Bag/Given 05/03/24 1241)  levofloxacin  (LEVAQUIN ) IVPB 750 mg (750 mg Intravenous New Bag/Given 05/03/24 1244)  sodium chloride  0.9 % bolus 1,000 mL (has no administration in time range)  sodium chloride  0.9 % bolus 1,000 mL (1,000 mLs Intravenous New Bag/Given 05/03/24 1141)  ketorolac  (TORADOL ) 30 MG/ML injection 30 mg (30 mg Intravenous Given 05/03/24 1140)    Clinical Course as of 05/03/24 1404  Thu May 03, 2024  1200 Strongly suspect pulmonary source - given allergies to cephalosporins, I've  ordered Levaquin  per protocol [MT]  1210 Lactic Acid, Venous: 1.2 [MT]  1338 2L fluid total per IBW sepsis bolus.  Lactate normal. [MT]  1402 Admitted to Dr Claudene hospitalist [MT]    Clinical Course User Index [MT] Cottie Donnice PARAS, MD                                 Medical Decision Making Amount and/or Complexity of Data Reviewed Labs: ordered. Decision-making details documented in ED Course. Radiology: ordered. ECG/medicine tests: ordered.  Risk Prescription drug management.   This patient presents to the ED with concern for fever, weakness, shortness of breath. This involves an extensive number of treatment options, and is a complaint that carries with it a high risk of complications and morbidity.  The differential diagnosis includes infection/sepsis including PNA vs viral illness vs other  Co-morbidities that complicate the patient evaluation: MS  Additional history obtained from EMS  External records from outside source obtained and reviewed including neurology telephone report  I ordered and personally interpreted labs.  The pertinent results include:  lactate wnl; WBC elevated; covid/flu negative  I ordered imaging studies including CT head, dg chest I independently visualized and interpreted imaging which showed no emergent findings, atelectasis in lower lungs but poor resp effort I agree with the radiologist interpretation  The patient was maintained on a cardiac monitor.  I personally viewed and interpreted the cardiac monitored which showed an underlying rhythm of: sinus tachycardia  Per my interpretation the patient's ECG shows sinus tachycardia without acute ischemic findings  I ordered medication including IV levaquin  for suspected sepsis PNA, IV fluid bolus per sepsis per IBW, IV toradol  for fever  I have reviewed the patients home medicines and have made adjustments as needed  Test Considered: lower suspicion for acute meningitis, acute PE, acute  intraabdominal pathology  After the interventions noted above, I reevaluated the patient and found that they have: improved   Dispostion:  After consideration of the diagnostic results and the patients response to treatment, I feel that the patent would benefit from medical admission.  She is stable from a respiratory standpoint, not requiring intubation.  Blood pressure stable.      Final diagnoses:  Sepsis, due to unspecified organism, unspecified whether acute organ dysfunction present (HCC)  Pneumonia due to infectious organism, unspecified laterality, unspecified part of lung    ED Discharge Orders     None  Cottie Donnice PARAS, MD 05/03/24 [phone]

## 2024-05-03 NOTE — Progress Notes (Signed)
 Pt had difficulty performing NIF with coaching from RT. Pt achieved -10. MD notified.

## 2024-05-03 NOTE — Progress Notes (Signed)
 Physical Therapy Missed Visit Note  Payor: [employer]HC MEDICARE ADV / Plan: UHC MA DUAL COMPLETE RPPO-SNP / Product Type: Medicare Advantage /     The patient did not attend therapy appointment.  Reason:  No Show  Total Missed Visits: Missed Visit Count: 3  Patient Contact:  No Contact with patient  Future Appointments:  Patient has additional appointments.  Action:  If pt does not attend her next visit, she will be cancelled per attendance policy due to 3 consecutive missed visits.

## 2024-05-03 NOTE — Sepsis Progress Note (Signed)
 Sepsis protocol monitored by eLink ?

## 2024-05-03 NOTE — ED Notes (Signed)
 Unable to obtain second set of BC d/t pt being a hard stick. ABX initiated.

## 2024-05-03 NOTE — Progress Notes (Signed)
 Orthopedic Tech Progress Note Patient Details:  Allison Moore 12/16/1980 [id number]  Level II trauma, no ortho tech orders at this time.  Patient ID: Allison Moore, female   DOB: 1981/08/13, 43 y.o.   MRN: [id number]  Allison Moore 05/03/2024, 1:41 PM

## 2024-05-03 NOTE — ED Triage Notes (Signed)
 Pt arrives via EMS from home with fall off the bed when trying to get to the bathroom. Pt arrives tachycardic, febrile, tachypnea. Denies thinners. Endorses 10/10 headache.
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
## 2024-05-04 ENCOUNTER — Inpatient Hospital Stay (HOSPITAL_COMMUNITY)

## 2024-05-04 DIAGNOSIS — H6092 Unspecified otitis externa, left ear: Secondary | ICD-10-CM

## 2024-05-04 DIAGNOSIS — J9811 Atelectasis: Secondary | ICD-10-CM | POA: Diagnosis not present

## 2024-05-04 DIAGNOSIS — J9601 Acute respiratory failure with hypoxia: Secondary | ICD-10-CM | POA: Diagnosis not present

## 2024-05-04 DIAGNOSIS — R1319 Other dysphagia: Secondary | ICD-10-CM

## 2024-05-04 DIAGNOSIS — F17210 Nicotine dependence, cigarettes, uncomplicated: Secondary | ICD-10-CM

## 2024-05-04 DIAGNOSIS — R0603 Acute respiratory distress: Secondary | ICD-10-CM | POA: Diagnosis not present

## 2024-05-04 DIAGNOSIS — J329 Chronic sinusitis, unspecified: Secondary | ICD-10-CM

## 2024-05-04 DIAGNOSIS — H6090 Unspecified otitis externa, unspecified ear: Secondary | ICD-10-CM

## 2024-05-04 DIAGNOSIS — R1310 Dysphagia, unspecified: Secondary | ICD-10-CM

## 2024-05-04 DIAGNOSIS — A419 Sepsis, unspecified organism: Secondary | ICD-10-CM | POA: Diagnosis not present

## 2024-05-04 DIAGNOSIS — J0140 Acute pansinusitis, unspecified: Secondary | ICD-10-CM

## 2024-05-04 DIAGNOSIS — J189 Pneumonia, unspecified organism: Secondary | ICD-10-CM

## 2024-05-04 LAB — HIV ANTIBODY (ROUTINE TESTING W REFLEX): HIV Screen 4th Generation wRfx: NONREACTIVE

## 2024-05-04 LAB — RESPIRATORY PANEL BY PCR

## 2024-05-04 LAB — BRAIN NATRIURETIC PEPTIDE: B Natriuretic Peptide: 99.1 pg/mL (ref 0.0–100.0)

## 2024-05-04 LAB — ECHOCARDIOGRAM COMPLETE
AR max vel: 2.95 cm2
AV Area VTI: 3.1 cm2
AV Area mean vel: 3.02 cm2
AV Mean grad: 6 mmHg
AV Peak grad: 11.6 mmHg
Ao pk vel: 1.7 m/s
Area-P 1/2: 5.97 cm2
Height: 64 in
S' Lateral: 3 cm
Weight: 3562.63 [oz_av]

## 2024-05-04 LAB — LACTIC ACID, PLASMA
Lactic Acid, Venous: 1.7 mmol/L (ref 0.5–1.9)
Lactic Acid, Venous: 1.4 mmol/L (ref 0.5–1.9)

## 2024-05-04 LAB — PROCALCITONIN: Procalcitonin: 2.24 ng/mL

## 2024-05-04 LAB — CBC
HCT: 32.9 % — ABNORMAL LOW (ref 36.0–46.0)
Hemoglobin: 10.6 g/dL — ABNORMAL LOW (ref 12.0–15.0)
MCH: 29.2 pg (ref 26.0–34.0)
MCHC: 32.2 g/dL (ref 30.0–36.0)
MCV: 90.6 fL (ref 80.0–100.0)
Platelets: 193 K/uL (ref 150–400)
RBC: 3.63 MIL/uL — ABNORMAL LOW (ref 3.87–5.11)
RDW: 13.3 % (ref 11.5–15.5)
WBC: 13.6 K/uL — ABNORMAL HIGH (ref 4.0–10.5)
nRBC: 0 % (ref 0.0–0.2)

## 2024-05-04 LAB — MRSA NEXT GEN BY PCR, NASAL: MRSA by PCR Next Gen: NOT DETECTED

## 2024-05-04 LAB — RAPID URINE DRUG SCREEN, HOSP PERFORMED
Amphetamines: NOT DETECTED
Barbiturates: NOT DETECTED
Benzodiazepines: NOT DETECTED
Cocaine: NOT DETECTED
Opiates: NOT DETECTED
Tetrahydrocannabinol: POSITIVE — AB

## 2024-05-04 LAB — TSH: TSH: 2.461 u[IU]/mL (ref 0.350–4.500)

## 2024-05-04 LAB — BASIC METABOLIC PANEL WITH GFR
Anion gap: 9 (ref 5–15)
BUN: 16 mg/dL (ref 6–20)
CO2: 20 mmol/L — ABNORMAL LOW (ref 22–32)
Calcium: 7.9 mg/dL — ABNORMAL LOW (ref 8.9–10.3)
Chloride: 105 mmol/L (ref 98–111)
Creatinine, Ser: 1.18 mg/dL — ABNORMAL HIGH (ref 0.44–1.00)
GFR, Estimated: 58 mL/min — ABNORMAL LOW (ref >60)
Glucose, Bld: 116 mg/dL — ABNORMAL HIGH (ref 70–99)
Potassium: 3.7 mmol/L (ref 3.5–5.1)
Sodium: 134 mmol/L — ABNORMAL LOW (ref 135–145)

## 2024-05-04 LAB — MAGNESIUM: Magnesium: 1.4 mg/dL — ABNORMAL LOW (ref 1.7–2.4)

## 2024-05-04 LAB — CORTISOL: Cortisol, Plasma: 21.7 ug/dL

## 2024-05-04 MED ORDER — MIDODRINE HCL 5 MG PO TABS
10.0000 mg | ORAL_TABLET | Freq: Three times a day (TID) | ORAL | Status: DC
  Administered 2024-05-05 – 2024-05-11 (×17): 10 mg via ORAL
  Filled 2024-05-04 (×17): qty 2

## 2024-05-04 MED ORDER — MAGNESIUM SULFATE 4 GM/100ML IV SOLN
4.0000 g | Freq: Once | INTRAVENOUS | Status: AC
  Administered 2024-05-04: 4 g via INTRAVENOUS
  Filled 2024-05-04: qty 100

## 2024-05-04 MED ORDER — IOHEXOL 350 MG/ML SOLN
75.0000 mL | Freq: Once | INTRAVENOUS | Status: AC | PRN
  Administered 2024-05-04: 75 mL via INTRAVENOUS

## 2024-05-04 MED ORDER — SODIUM CHLORIDE 3 % IN NEBU
4.0000 mL | INHALATION_SOLUTION | Freq: Every day | RESPIRATORY_TRACT | Status: AC
  Administered 2024-05-04 – 2024-05-06 (×3): 4 mL via RESPIRATORY_TRACT
  Filled 2024-05-04 (×3): qty 4

## 2024-05-04 MED ORDER — VANCOMYCIN HCL IN DEXTROSE 750-5 MG/150ML-% IV SOLN: 750.0000 mg | Freq: Two times a day (BID) | INTRAVENOUS | Status: DC

## 2024-05-04 MED ORDER — SODIUM CHLORIDE 0.9 % IV SOLN: 500.0000 mg | Freq: Every day | INTRAVENOUS | Status: DC

## 2024-05-04 MED ORDER — VANCOMYCIN HCL 750 MG/150ML IV SOLN
750.0000 mg | Freq: Two times a day (BID) | INTRAVENOUS | Status: DC
  Filled 2024-05-04: qty 150

## 2024-05-04 MED ORDER — MAGIC MOUTHWASH W/LIDOCAINE
15.0000 mL | Freq: Three times a day (TID) | ORAL | Status: DC
  Administered 2024-05-05 – 2024-05-11 (×23): 15 mL via ORAL
  Filled 2024-05-04 (×30): qty 15

## 2024-05-04 MED ORDER — SODIUM CHLORIDE 0.9 % IV SOLN: INTRAVENOUS | Status: DC

## 2024-05-04 MED ORDER — LORAZEPAM 2 MG/ML IJ SOLN
1.0000 mg | Freq: Once | INTRAMUSCULAR | Status: AC | PRN
  Administered 2024-05-04: 1 mg via INTRAVENOUS
  Filled 2024-05-04: qty 1

## 2024-05-04 MED ORDER — POTASSIUM CHLORIDE IN NACL 20-0.9 MEQ/L-% IV SOLN
INTRAVENOUS | Status: DC
  Filled 2024-05-04 (×2): qty 1000

## 2024-05-04 MED ORDER — SALINE SPRAY 0.65 % NA SOLN
1.0000 | NASAL | Status: DC | PRN
  Administered 2024-05-04 – 2024-05-07 (×2): 1 via NASAL
  Filled 2024-05-04: qty 44

## 2024-05-04 MED ORDER — PANTOPRAZOLE SODIUM 40 MG IV SOLR
40.0000 mg | Freq: Two times a day (BID) | INTRAVENOUS | Status: DC
  Administered 2024-05-04 – 2024-05-08 (×9): 40 mg via INTRAVENOUS
  Filled 2024-05-04 (×9): qty 10

## 2024-05-04 MED ORDER — PIPERACILLIN-TAZOBACTAM 3.375 G IVPB
3.3750 g | Freq: Three times a day (TID) | INTRAVENOUS | Status: AC
  Administered 2024-05-04 – 2024-05-10 (×20): 3.375 g via INTRAVENOUS
  Filled 2024-05-04 (×20): qty 50

## 2024-05-04 MED ORDER — ACETAMINOPHEN 10 MG/ML IV SOLN
1000.0000 mg | Freq: Four times a day (QID) | INTRAVENOUS | Status: AC | PRN
  Filled 2024-05-04: qty 100

## 2024-05-04 MED ORDER — DEXTROSE 5 % IV SOLN
5.0000 mg/kg | Freq: Three times a day (TID) | INTRAVENOUS | Status: DC
  Administered 2024-05-04 – 2024-05-05 (×4): 365 mg via INTRAVENOUS
  Filled 2024-05-04 (×6): qty 7.3

## 2024-05-04 MED ORDER — FLUCONAZOLE IN SODIUM CHLORIDE 400-0.9 MG/200ML-% IV SOLN
400.0000 mg | Freq: Every day | INTRAVENOUS | Status: DC
  Administered 2024-05-04 – 2024-05-06 (×3): 400 mg via INTRAVENOUS
  Filled 2024-05-04 (×4): qty 200

## 2024-05-04 MED ORDER — IPRATROPIUM-ALBUTEROL 0.5-2.5 (3) MG/3ML IN SOLN
3.0000 mL | Freq: Four times a day (QID) | RESPIRATORY_TRACT | Status: DC
  Administered 2024-05-04 – 2024-05-08 (×13): 3 mL via RESPIRATORY_TRACT
  Filled 2024-05-04 (×12): qty 3

## 2024-05-04 MED ORDER — ENOXAPARIN SODIUM 40 MG/0.4ML IJ SOSY
40.0000 mg | PREFILLED_SYRINGE | Freq: Every day | INTRAMUSCULAR | Status: DC
  Administered 2024-05-05 – 2024-05-10 (×6): 40 mg via SUBCUTANEOUS
  Filled 2024-05-04 (×7): qty 0.4

## 2024-05-04 NOTE — Discharge Instructions (Signed)

## 2024-05-04 NOTE — Progress Notes (Signed)
 [facility] night cross cover note:   I was notified by the patient's RN that the patient had multiple objective fevers during dayshift today, and that patient currently has orders for prn PO/PR acetaminophen .  However, the patient is currently strict NPO after failing RN swallow screen and is awaiting formal Swallow Evaluation by SLP. She is also on chronic immunosuppressive therapy in the setting of MS, limiting the option for PR administration.   In the setting of the above, I discontinued current order for prn acetaminophen  via p.o./PR route, and ordered prv iv acetaminophen  for fever.      Eva Pore, DO Hospitalist

## 2024-05-04 NOTE — Progress Notes (Addendum)
 PROGRESS NOTE        PATIENT DETAILS Name: Allison Moore Age: 43 y.o. Sex: female Date of Birth: 1981-07-06 Admit Date: 05/03/2024 Admitting Physician Maximino DELENA Sharps, MD ERE:[id number], Rosina [name], PA  Brief Summary: Patient is a 43 y.o.  female with history of MS (walker at baseline)-who was on Kesimpta injections in May 2025 (?3 doses so far)-who presented to the ED for 4 to 5-day history of subjective fever, nasal congestion, shortness of breath, left ear watery discharge, odynophagia and frequent falls.  She was found to have pneumonia-with sepsis physiology and subsequently admitted to the hospitalist service.  Significant events: 7/10>> admit to [facility]-sepsis-frequent falls-PNA-URI symptoms-left ear discharge.  Significant studies: 7/10>> CT head: No acute intracranial abnormality 7/11>> CXR: Left basilar opacity.  Significant microbiology data: 7/10>> COVID/influenza/RSV PCR: Negative 7/10>> blood cultures: Pending 7/11>> respiratory virus panel: Negative  Procedures: None  Consults: ENT ID GI  Subjective: Lying comfortably in bed-denies any chest pain or shortness of breath.  Objective: Vitals: Blood pressure (!) 99/52, pulse (!) 128, temperature (!) 102.8 F (39.3 C), temperature source Oral, resp. rate (!) 24, height 5' 4 (1.626 m), weight 101 kg, SpO2 98%.   Exam: Gen Exam: Awake-slightly tachypneic-but not in any distress.  Easily able to complete full sentences. HEENT:atraumatic, normocephalic.  Do not see any obvious Left ear otoscopic exam: Some fluid-some wax noted but do not see any obvious abnormality-exam limited by patient's discomfort-could not visualize tympanic membrane. Chest: Bibasilar Rales-significant transmitted upper airway sounds. CVS:S1S2 regular Abdomen:soft non tender, non distended Extremities:no edema Neurology: Appears to have generalized weakness with does not appear to be focal. Skin: no rash  Pertinent  Labs/Radiology:    Latest Ref Rng & Units 05/04/2024    6:29 AM 05/03/2024   12:03 PM 05/03/2024   11:18 AM  CBC  WBC 4.0 - 10.5 K/uL 13.6   17.4   Hemoglobin 12.0 - 15.0 g/dL 89.3  85.6  86.5   Hematocrit 36.0 - 46.0 % 32.9  42.0  40.6   Platelets 150 - 400 K/uL 193   254     Lab Results  Component Value Date   NA 137 05/03/2024   K 3.7 05/03/2024   CL 100 05/03/2024   CO2 24 05/03/2024      Assessment/Plan: Sepsis-with multiple foci of infection-PNA/possible esophagitis/possible otitis externa-immunocompromised on Ofatumumab injections for multiple sclerosis Sepsis physiology persists-blood pressure soft-she continues to have cough/congestion/watery discharge from her left ear.  Her odynophagia is both to solids and liquids. Blood cultures pending-however viral PCR is negative so far For now-will continue with vancomycin /cefepime  Will discuss with infectious disease to see if we need to broaden coverage to include fluconazole /Valtrex for odynophagia Will get a dedicated CT of the sinuses given left ear discharge/periauricular pain/tenderness.  Will also get a CT scan of the chest-to assess lung parenchyma and also to assess her esophagus. Will also discuss with GI regarding possibility of getting an EGD at some point. Watch closely-if she deteriorates any further-she will need to be transferred to the ICU.  Addendum Discussed with infectious disease-Dr. Fleeta Kaiser not sound like MRSA infection-okay to remove vancomycin  and Zithromax-consolidate gram-negative coverage to Zosyn -add empiric fluconazole  and acyclovir  for esophagitis.  He will formally consult and provide further recommendations.  History of MS with multiple falls Discussed with neurology-infection could be causing her to have these  falls-concern is that she may also have MS flare provoked by an infectious cause Neurology recommending MRI brain/C-spine/T-spine with and without contrast. PT/OT  eval.  HLD Statin  Schizoaffective disorder Anxiety/depression Anxiety slightly worse due to acute illness Continue Zoloft /risperidone   Class 2 Obesity: Estimated body mass index is 38.22 kg/m as calculated from the following:   Height as of this encounter: 5' 4 (1.626 m).   Weight as of this encounter: 101 kg.   Code status:   Code Status: Full Code   DVT Prophylaxis: enoxaparin  (LOVENOX ) injection 40 mg Start: 05/04/24 1000 SCDs Start: 05/03/24 1357    Family Communication: Father-Lester [phone] updated over the phone   Disposition Plan: Status is: Inpatient Remains inpatient appropriate because: Severity of illness   Planned Discharge Destination:Home health versus SNF   Diet: Diet Order             Diet regular Room service appropriate? Yes; Fluid consistency: Thin  Diet effective now                     Antimicrobial agents: Anti-infectives (From admission, onward)    Start     Dose/Rate Route Frequency Ordered Stop   05/04/24 1200  levofloxacin  (LEVAQUIN ) IVPB 750 mg  Status:  Discontinued        750 mg 100 mL/hr over 90 Minutes Intravenous Every 24 hours 05/03/24 1451 05/03/24 2227   05/04/24 1200  vancomycin  (VANCOCIN ) IVPB 750 mg/150 ml premix  Status:  Discontinued        750 mg 150 mL/hr over 60 Minutes Intravenous Every 12 hours 05/03/24 2219 05/04/24 0207   05/04/24 1000  vancomycin  (VANCOREADY) IVPB 750 mg/150 mL        750 mg 150 mL/hr over 60 Minutes Intravenous Every 12 hours 05/04/24 0208     05/04/24 0900  azithromycin (ZITHROMAX) 500 mg in sodium chloride  0.9 % 250 mL IVPB        500 mg 250 mL/hr over 60 Minutes Intravenous Every 24 hours 05/04/24 0807 05/07/24 0859   05/04/24 0300  vancomycin  (VANCOCIN ) IVPB 750 mg/150 ml premix  Status:  Discontinued        750 mg 150 mL/hr over 60 Minutes Intravenous 2 times daily 05/04/24 0207 05/04/24 0207   05/03/24 2315  vancomycin  (VANCOCIN ) 2,000 mg in sodium chloride  0.9  % 500 mL IVPB        2,000 mg 260 mL/hr over 120 Minutes Intravenous NOW 05/03/24 2219 05/04/24 0149   05/03/24 2300  ceFEPIme  (MAXIPIME ) 2 g in sodium chloride  0.9 % 100 mL IVPB        2 g 200 mL/hr over 30 Minutes Intravenous Every 8 hours 05/03/24 2219     05/03/24 1200  levofloxacin  (LEVAQUIN ) IVPB 750 mg        750 mg 100 mL/hr over 90 Minutes Intravenous  Once 05/03/24 1155 05/03/24 1801        MEDICATIONS: Scheduled Meds:  atorvastatin   20 mg Oral QHS   enoxaparin  (LOVENOX ) injection  40 mg Subcutaneous Daily   gabapentin   300 mg Oral TID   guaiFENesin   600 mg Oral BID   magic mouthwash w/lidocaine   15 mL Oral QID   multivitamin with minerals  1 tablet Oral Daily   risperiDONE   3 mg Oral QHS   sertraline   150 mg Oral Daily   Continuous Infusions:  azithromycin     ceFEPime  (MAXIPIME ) IV 2 g (05/04/24 0655)   vancomycin   PRN Meds:.acetaminophen  **OR** acetaminophen , albuterol , hydrOXYzine , sodium chloride    I have personally reviewed following labs and imaging studies  LABORATORY DATA: CBC: Recent Labs  Lab 05/03/24 1118 05/03/24 1203 05/04/24 0629  WBC 17.4*  --  13.6*  NEUTROABS 16.0*  --   --   HGB 13.4 14.3 10.6*  HCT 40.6 42.0 32.9*  MCV 88.3  --  90.6  PLT 254  --  193    Basic Metabolic Panel: Recent Labs  Lab 05/03/24 1118 05/03/24 1203  NA 135 137  K 3.8 3.7  CL 100 100  CO2 24  --   GLUCOSE 119* 115*  BUN 11 11  CREATININE 0.98 1.00  CALCIUM  9.4  --     GFR: Estimated Creatinine Clearance: 83 mL/min (by C-G formula based on SCr of 1 mg/dL).  Liver Function Tests: Recent Labs  Lab 05/03/24 1118  AST 43*  ALT 40  ALKPHOS 66  BILITOT 1.2  PROT 7.7  ALBUMIN 3.6   No results for input(s): LIPASE, AMYLASE in the last 168 hours. No results for input(s): AMMONIA in the last 168 hours.  Coagulation Profile: Recent Labs  Lab 05/03/24 1118  INR 1.1    Cardiac Enzymes: No results for input(s): CKTOTAL, CKMB,  CKMBINDEX, TROPONINI in the last 168 hours.  BNP (last 3 results) No results for input(s): PROBNP in the last 8760 hours.  Lipid Profile: No results for input(s): CHOL, HDL, LDLCALC, TRIG, CHOLHDL, LDLDIRECT in the last 72 hours.  Thyroid  Function Tests: No results for input(s): TSH, T4TOTAL, FREET4, T3FREE, THYROIDAB in the last 72 hours.  Anemia Panel: No results for input(s): VITAMINB12, FOLATE, FERRITIN, TIBC, IRON, RETICCTPCT in the last 72 hours.  Urine analysis:    Component Value Date/Time   COLORURINE YELLOW 11/18/2023 1418   APPEARANCEUR CLEAR 11/18/2023 1418   LABSPEC 1.015 11/18/2023 1418   PHURINE 6.0 11/18/2023 1418   GLUCOSEU NEGATIVE 11/18/2023 1418   HGBUR MODERATE (A) 11/18/2023 1418   HGBUR moderate 07/30/2010 1540   BILIRUBINUR NEGATIVE 11/18/2023 1418   BILIRUBINUR negative 11/28/2018 1145   BILIRUBINUR neg 05/25/2013 1625   KETONESUR NEGATIVE 11/18/2023 1418   PROTEINUR NEGATIVE 11/18/2023 1418   UROBILINOGEN 0.2 01/31/2021 1021   NITRITE NEGATIVE 11/18/2023 1418   LEUKOCYTESUR NEGATIVE 11/18/2023 1418    Sepsis Labs: Lactic Acid, Venous    Component Value Date/Time   LATICACIDVEN 1.7 05/04/2024 9370    MICROBIOLOGY: Recent Results (from the past 240 hours)  Resp panel by RT-PCR (RSV, Flu A&B, Covid) Anterior Nasal Swab     Status: None   Collection Time: 05/03/24  1:11 PM   Specimen: Anterior Nasal Swab  Result Value Ref Range Status   SARS Coronavirus 2 by RT PCR NEGATIVE NEGATIVE Final   Influenza A by PCR NEGATIVE NEGATIVE Final   Influenza B by PCR NEGATIVE NEGATIVE Final    Comment: (NOTE) The Xpert Xpress SARS-CoV-2/FLU/RSV plus assay is intended as an aid in the diagnosis of influenza from Nasopharyngeal swab specimens and should not be used as a sole basis for treatment. Nasal washings and aspirates are unacceptable for Xpert Xpress SARS-CoV-2/FLU/RSV testing.  Fact Sheet for  Patients: BloggerCourse.com  Fact Sheet for Healthcare Providers: SeriousBroker.it  This test is not yet approved or cleared by the [place]  FDA and has been authorized for detection and/or diagnosis of SARS-CoV-2 by FDA under an Emergency Use Authorization (EUA). This EUA will remain in effect (meaning this test can be used) for the duration of the  COVID-19 declaration under Section 564(b)(1) of the Act, 21 U.S.C. section 360bbb-3(b)(1), unless the authorization is terminated or revoked.     Resp Syncytial Virus by PCR NEGATIVE NEGATIVE Final    Comment: (NOTE) Fact Sheet for Patients: BloggerCourse.com  Fact Sheet for Healthcare Providers: SeriousBroker.it  This test is not yet approved or cleared by the [place]  FDA and has been authorized for detection and/or diagnosis of SARS-CoV-2 by FDA under an Emergency Use Authorization (EUA). This EUA will remain in effect (meaning this test can be used) for the duration of the COVID-19 declaration under Section 564(b)(1) of the Act, 21 U.S.C. section 360bbb-3(b)(1), unless the authorization is terminated or revoked.  Performed at [facility], [address] [place], [place] [address]   MRSA Next Gen by PCR, Nasal     Status: None   Collection Time: 05/03/24 10:20 PM   Specimen: Nasal Mucosa; Nasal Swab  Result Value Ref Range Status   MRSA by PCR Next Gen NOT DETECTED NOT DETECTED Final    Comment: (NOTE) The GeneXpert MRSA Assay (FDA approved for NASAL specimens only), is one component of a comprehensive MRSA colonization surveillance program. It is not intended to diagnose MRSA infection nor to guide or monitor treatment for MRSA infections. Test performance is not FDA approved in patients less than 29 years old. Performed at [facility], [address], [place], [place] [address]   Respiratory (~20  pathogens) panel by PCR     Status: None   Collection Time: 05/04/24  6:20 AM   Specimen: Nasopharyngeal Swab; Respiratory  Result Value Ref Range Status   Adenovirus NOT DETECTED NOT DETECTED Final   Coronavirus 229E NOT DETECTED NOT DETECTED Final    Comment: (NOTE) The Coronavirus on the Respiratory Panel, DOES NOT test for the novel  Coronavirus (2019 nCoV)    Coronavirus HKU1 NOT DETECTED NOT DETECTED Final   Coronavirus NL63 NOT DETECTED NOT DETECTED Final   Coronavirus OC43 NOT DETECTED NOT DETECTED Final   Metapneumovirus NOT DETECTED NOT DETECTED Final   Rhinovirus / Enterovirus NOT DETECTED NOT DETECTED Final   Influenza A NOT DETECTED NOT DETECTED Final   Influenza B NOT DETECTED NOT DETECTED Final   Parainfluenza Virus 1 NOT DETECTED NOT DETECTED Final   Parainfluenza Virus 2 NOT DETECTED NOT DETECTED Final   Parainfluenza Virus 3 NOT DETECTED NOT DETECTED Final   Parainfluenza Virus 4 NOT DETECTED NOT DETECTED Final   Respiratory Syncytial Virus NOT DETECTED NOT DETECTED Final   Bordetella pertussis NOT DETECTED NOT DETECTED Final   Bordetella Parapertussis NOT DETECTED NOT DETECTED Final   Chlamydophila pneumoniae NOT DETECTED NOT DETECTED Final   Mycoplasma pneumoniae NOT DETECTED NOT DETECTED Final    Comment: Performed at [facility], [address], [place], [place] [address]    RADIOLOGY STUDIES/RESULTS: DG Chest Port 1 View Result Date: 05/04/2024 CLINICAL DATA:  Tachycardia EXAM: PORTABLE CHEST 1 VIEW COMPARISON:  05/03/2024 FINDINGS: Heart and mediastinal contours are within normal limits. Left basilar airspace opacity. Right lung clear. No effusions. No acute bony abnormality. IMPRESSION: Left basilar airspace opacity could reflect atelectasis or pneumonia. Electronically Signed   By: Franky Crease M.D.   On: 05/04/2024 02:52   CT Head Wo Contrast Result Date: 05/03/2024 CLINICAL DATA:  Trauma EXAM: CT HEAD WITHOUT CONTRAST TECHNIQUE: Contiguous axial  images were obtained from the base of the skull through the vertex without intravenous contrast. RADIATION DOSE REDUCTION: This exam was performed according  to the departmental dose-optimization program which includes automated exposure control, adjustment of the mA and/or kV according to patient size and/or use of iterative reconstruction technique. COMPARISON:  MRI head 11/19/2023. FINDINGS: Brain: No acute intracranial hemorrhage. No CT evidence of acute infarct. Nonspecific hypoattenuation in the periventricular and subcortical white matter favored to reflect chronic microvascular ischemic changes. No edema, mass effect, or midline shift. The basilar cisterns are patent. Ventricles: The ventricles are normal. Vascular: No hyperdense vessel or unexpected calcification. Skull: No acute or aggressive finding. Orbits: Orbits are symmetric. Sinuses: Mucosal thickening throughout the paranasal sinuses particularly in the ethmoid sinuses, right greater than left. Mucosal thickening in the right frontal sinus. Mucosal thickening bilateral maxillary sinuses. Other: Trace fluid in the left mastoid air cells. IMPRESSION: No CT evidence of acute intracranial abnormality. White matter hypoattenuation which may reflect demyelinating process. Paranasal sinus disease as above. Electronically Signed   By: Donnice Mania M.D.   On: 05/03/2024 11:51   DG Chest Portable 1 View Result Date: 05/03/2024 CLINICAL DATA:  Shortness of breath. EXAM: PORTABLE CHEST 1 VIEW COMPARISON:  Chest radiograph dated 11/13/2022. FINDINGS: Shallow inspiration with bibasilar atelectasis. No focal consolidation, pleural effusion or pneumothorax. No acute osseous pathology. IMPRESSION: Shallow inspiration with bibasilar atelectasis. Electronically Signed   By: Vanetta Chou M.D.   On: 05/03/2024 11:39     LOS: 1 day   Donalda Applebaum, MD  [facility]    To contact the attending provider between 7A-7P or the covering provider during  after hours 7P-7A, please log into the web site [email or web address] and access using universal [place] password for that web site. If you do not have the password, please call the hospital operator.  05/04/2024, 8:07 AM

## 2024-05-04 NOTE — Progress Notes (Addendum)
 SLP Cancellation Note  Patient Details Name: Allison Moore MRN: [id number] DOB: 1981-08-25   Cancelled treatment:       Reason Eval/Treat Not Completed: Patient at procedure or test/unavailable. Pt off unit for CT at 0845, leaving for MRI at 0927, now being transferred to ICU due to decline in status. Will continue efforts to complete BSE when appropriate. Pt currently NPO. RN aware.  Flecia Shutter B. Dory, MSP, CCC-SLP Speech Language Pathologist Office: [phone]  Dory Caprice Daring 05/04/2024, 9:27 AM, 11:34 AM

## 2024-05-04 NOTE — Progress Notes (Signed)
 Pt achieved a NIF of -25 with great pt effort on all attempts. MD notified.

## 2024-05-04 NOTE — TOC CM/SW Note (Signed)
 Transition of Care [facility]) - Inpatient Brief Assessment   Patient Details  Name: Allison Moore MRN: [id number] Date of Birth: 1981/07/18  [facility] CM/SW Contact:    Inocente GORMAN Kindle, LCSW Phone Number: 05/04/2024, 10:39 AM   Clinical Narrative: Patient admitted from home and was doing outpatient therapy, with a walker and wc. Patient not in room to discuss SDOH flags so resources provided on AVS for the time being.    Transition of Care Asessment: Insurance and Status: Insurance coverage has been reviewed Patient has primary care physician: Yes Home environment has been reviewed: From home Prior level of function:: Modified Independent Prior/Current Home Services: No current home services Social Drivers of Health Review: SDOH reviewed needs interventions Readmission risk has been reviewed: Yes Transition of care needs: transition of care needs identified, TOC will continue to follow

## 2024-05-04 NOTE — Progress Notes (Signed)
*  PRELIMINARY RESULTS* Echocardiogram 2D Echocardiogram has been performed.  Benard FORBES Stallion 05/04/2024, 12:33 PM

## 2024-05-04 NOTE — Consult Note (Signed)
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   On: 05/03/2024 11:51   DG Chest Portable 1 View Result Date: 05/03/2024 CLINICAL DATA:  Shortness of breath. EXAM: PORTABLE CHEST 1 VIEW COMPARISON:  Chest radiograph dated 11/13/2022. FINDINGS: Shallow inspiration with bibasilar atelectasis. No focal consolidation, pleural effusion or pneumothorax. No acute osseous pathology. IMPRESSION: Shallow inspiration with bibasilar atelectasis. Electronically Signed   By: Vanetta Chou M.D.   On:  05/03/2024 11:39    ROS Blood pressure (!) 99/52, pulse (!) 128, temperature (!) 102.8 F (39.3 C), temperature source Oral, resp. rate (!) 24, height 5' 4 (1.626 m), weight 101 kg, SpO2 98%. Physical Exam Constitutional:      Comments: Seems slow and weak.   HENT:     Head: Normocephalic.     Right Ear: External ear normal.     Ears:     Comments: Left ear has some watery drainage in the canal. No granulation tissue. I cannot see the TM. The canal is open. There is no erythema or swelling surrounding the ear. The pinna hurts to move it. Facial nerve is intact. She has no gross difference in the hearing    Nose: Nose normal.     Mouth/Throat:     Mouth: Mucous membranes are moist.     Comments: No trismus Eyes:     Pupils: Pupils are equal, round, and reactive to light.  Musculoskeletal:     Cervical back: Normal range of motion.       Assessment/Plan: Sinusitis/otitis externa- she needs ciprodex  drops to the right ear BID for 10 days. No wick needed right now. Sinusitis is likely the cause of the sore throat with drainage. Antibiotics for the infection as treated and afrin spray to the nose BID for 3 days only. Call if not resolving the ear pain in 2-3 days.   Norleen Notice 05/04/2024, 11:36 AM

## 2024-05-04 NOTE — Progress Notes (Signed)
 Pharmacy Antibiotic Note  Allison Moore is a 43 y.o. female admitted on 05/03/2024 with pneumonia, sepsis, possible otitis, and possible esophagitis.  Pharmacy has been consulted for fluconazole , acyclovir , and Zosyn  dosing.  Patient is on antibiotic day 2. Patient immunocompromised on Kesimpta (ofatumumab) for MS. Patient's temp trending up (104.9) and has a consistently elevated WBC count (13.6). Patient also having cough, congestion, and discharge from L ear. Patient experiencing dysphagia. After discussion with Dr. KANDICE, antibiotics will be changed from vancomycin  and cefepime  to Zosyn , fluconazole , and acyclovir  for empiric coverage.  Plan: -Piperacillin /tazobactam 3.375 g IV q8H -Fluconazole  400 mg IV q24h -Acyclovir  5 mg/kg IV q8H  -0.9% NS 125 mL/hr to continue for renal protection while on acyclovir  -Monitor BC (7/10), temp, WBC, symptoms -F/u ID recs  Height: 5' 4 (162.6 cm) Weight: 101 kg (222 lb 10.6 oz) IBW/kg (Calculated) : 54.7  Temp (24hrs), Avg:102.8 F (39.3 C), Min:100.3 F (37.9 C), Max:104.9 F (40.5 C)  Recent Labs  Lab 05/03/24 1118 05/03/24 1203 05/03/24 1204 05/04/24 0629  WBC 17.4*  --   --  13.6*  CREATININE 0.98 1.00  --   --   LATICACIDVEN  --   --  1.2 1.7    Estimated Creatinine Clearance: 83 mL/min (by C-G formula based on SCr of 1 mg/dL).    Allergies  Allergen Reactions   Norco [Hydrocodone -Acetaminophen ] Other (See Comments)    Confusion   Pork (Diagnostic) Other (See Comments)    Per pt, contraindicated due to MS   Keflex [Cephalexin] Rash    Antimicrobials this admission: Piperacillin /tazobactam 3.375 g IV q8H 7/11 >>  Fluconazole  400 mg IV q24h 7/11 >>  Acyclovir  5 mg/kg IV q8H 7/11 >> Vancomycin  750 mg IV q12h 7/10 > 7/11    -Vanc 2000 mg IV x1 7/10 Cefepime  2g IV q8H 7/10 > 7/11  Microbiology results: 7/10 BCx: NGTD 7/10 MRSA PCR: Not detected  Thank you for allowing pharmacy to be a part of this patient's  care.  Izetta Carl, PharmD PGY1 Pharmacy Resident [facility]  05/04/2024 9:02 AM

## 2024-05-04 NOTE — Progress Notes (Addendum)
 Allison Moore, is a 43 y.o. female, DOB - [birth year], FMW:[id number]   43 y.o.  female with history of MS (walker at baseline)-who was on Kesimpta injections in May 2025 (?3 doses so far)-who presented to the ED for 4 to 5-day history of subjective fever, nasal congestion, shortness of breath, left ear watery discharge, odynophagia and frequent falls.  She was found to have pneumonia-with sepsis physiology and subsequently admitted to the hospitalist service.  CT of the head Pansinusitis with multifocal obstruction of the sinus drainage pathways along with partial opacification of the left middle ear cavity. Left mastoid effusion, CT chest suspicious for pneumonia.  Clinically she has severe sepsis .  Reevaluated the patient at 11 AM on 05/04/2024, drowsy, gurgling, coarse bilateral breath sounds, hypotensive still febrile, heart rate of 125 with sinus tachycardia, appears toxic.  PCCM consulted immediately for ICU transfer.  Added midodrine , continue IV fluids, replace magnesium , continue antibiotics.  ID to evaluate, follow-up on MRI brain and spine recommended by neurology.      Significant microbiology data: 7/10>> COVID/influenza/RSV PCR: Negative 7/10>> blood cultures: Pending 7/11>> respiratory virus panel: Negative     Consults: ENT ID GI PCCM  Vitals:   05/03/24 2157 05/03/24 2345 05/03/24 2355 05/04/24 0445  BP: (!) 88/50 98/80  (!) 99/52  Pulse: (!) 119 (!) 123 (!) 118 (!) 128  Resp: (!) 24 (!) 29 (!) 27 (!) 24  Temp:    (!) 102.8 F (39.3 C)  TempSrc:    Oral  SpO2: 98% 96% 98% 98%  Weight:      Height:            Data Review   Micro Results Recent Results (from the past 240 hours)  Blood Culture (routine x 2)     Status: None (Preliminary result)   Collection Time: 05/03/24 11:23 AM   Specimen: BLOOD  Result Value Ref Range Status   Specimen Description BLOOD SITE NOT  SPECIFIED  Final   Special Requests   Final    BOTTLES DRAWN AEROBIC AND ANAEROBIC Blood Culture adequate volume   Culture   Final    NO GROWTH < 24 HOURS Performed at [facility], [address], [place], [place] [address]    Report Status PENDING  Incomplete  Blood Culture (routine x 2)     Status: None (Preliminary result)   Collection Time: 05/03/24 11:43 AM   Specimen: BLOOD LEFT ARM  Result Value Ref Range Status   Specimen Description BLOOD LEFT ARM  Final   Special Requests   Final    BOTTLES DRAWN AEROBIC AND ANAEROBIC Blood Culture adequate volume   Culture   Final    NO GROWTH < 24 HOURS Performed at [facility], [address] [place], [place] [address]    Report Status PENDING  Incomplete  Resp panel by RT-PCR (RSV, Flu A&B, Covid) Anterior Nasal Swab     Status: None   Collection Time: 05/03/24  1:11 PM   Specimen: Anterior Nasal Swab  Result Value Ref Range Status   SARS Coronavirus 2 by RT PCR NEGATIVE NEGATIVE Final   Influenza A by PCR NEGATIVE NEGATIVE Final   Influenza B by PCR NEGATIVE NEGATIVE Final    Comment: (NOTE) The Xpert Xpress SARS-CoV-2/FLU/RSV plus assay is intended as an aid in the diagnosis of influenza from Nasopharyngeal swab specimens and should not be used as a sole basis for treatment. Nasal washings and aspirates are unacceptable for  Xpert Xpress SARS-CoV-2/FLU/RSV testing.  Fact Sheet for Patients: BloggerCourse.com  Fact Sheet for Healthcare Providers: SeriousBroker.it  This test is not yet approved or cleared by the [place]  FDA and has been authorized for detection and/or diagnosis of SARS-CoV-2 by FDA under an Emergency Use Authorization (EUA). This EUA will remain in effect (meaning this test can be used) for the duration of the COVID-19 declaration under Section 564(b)(1) of the Act, 21 U.S.C. section 360bbb-3(b)(1), unless the authorization is terminated  or revoked.     Resp Syncytial Virus by PCR NEGATIVE NEGATIVE Final    Comment: (NOTE) Fact Sheet for Patients: BloggerCourse.com  Fact Sheet for Healthcare Providers: SeriousBroker.it  This test is not yet approved or cleared by the [place]  FDA and has been authorized for detection and/or diagnosis of SARS-CoV-2 by FDA under an Emergency Use Authorization (EUA). This EUA will remain in effect (meaning this test can be used) for the duration of the COVID-19 declaration under Section 564(b)(1) of the Act, 21 U.S.C. section 360bbb-3(b)(1), unless the authorization is terminated or revoked.  Performed at [facility], [address] [place], [place] [address]   MRSA Next Gen by PCR, Nasal     Status: None   Collection Time: 05/03/24 10:20 PM   Specimen: Nasal Mucosa; Nasal Swab  Result Value Ref Range Status   MRSA by PCR Next Gen NOT DETECTED NOT DETECTED Final    Comment: (NOTE) The GeneXpert MRSA Assay (FDA approved for NASAL specimens only), is one component of a comprehensive MRSA colonization surveillance program. It is not intended to diagnose MRSA infection nor to guide or monitor treatment for MRSA infections. Test performance is not FDA approved in patients less than 17 years old. Performed at [facility], [address] [place], [place] [address]   Respiratory (~20 pathogens) panel by PCR     Status: None   Collection Time: 05/04/24  6:20 AM   Specimen: Nasopharyngeal Swab; Respiratory  Result Value Ref Range Status   Adenovirus NOT DETECTED NOT DETECTED Final   Coronavirus 229E NOT DETECTED NOT DETECTED Final    Comment: (NOTE) The Coronavirus on the Respiratory Panel, DOES NOT test for the novel  Coronavirus (2019 nCoV)    Coronavirus HKU1 NOT DETECTED NOT DETECTED Final   Coronavirus NL63 NOT DETECTED NOT DETECTED Final   Coronavirus OC43 NOT DETECTED NOT DETECTED Final   Metapneumovirus  NOT DETECTED NOT DETECTED Final   Rhinovirus / Enterovirus NOT DETECTED NOT DETECTED Final   Influenza A NOT DETECTED NOT DETECTED Final   Influenza B NOT DETECTED NOT DETECTED Final   Parainfluenza Virus 1 NOT DETECTED NOT DETECTED Final   Parainfluenza Virus 2 NOT DETECTED NOT DETECTED Final   Parainfluenza Virus 3 NOT DETECTED NOT DETECTED Final   Parainfluenza Virus 4 NOT DETECTED NOT DETECTED Final   Respiratory Syncytial Virus NOT DETECTED NOT DETECTED Final   Bordetella pertussis NOT DETECTED NOT DETECTED Final   Bordetella Parapertussis NOT DETECTED NOT DETECTED Final   Chlamydophila pneumoniae NOT DETECTED NOT DETECTED Final   Mycoplasma pneumoniae NOT DETECTED NOT DETECTED Final    Comment: Performed at [facility], [address], [place], [place] [address]    Radiology Reports CT MAXILLOFACIAL WO CONTRAST Result Date: 05/04/2024 CLINICAL DATA:  Provided history: Mastication paralysis/weakness. Immunocompromised patient. Ear pain. EXAM: CT MAXILLOFACIAL WITHOUT CONTRAST TECHNIQUE: Multidetector CT imaging of the maxillofacial structures was performed. Multiplanar CT image reconstructions were also generated. RADIATION DOSE REDUCTION: This  exam was performed according to the departmental dose-optimization program which includes automated exposure control, adjustment of the mA and/or kV according to patient size and/or use of iterative reconstruction technique. COMPARISON:  Brain MRI 11/19/2023. Head CT 09/03/2011. Maxillofacial CT 05/14/2010. FINDINGS: Osseous: Redemonstrated chronic fracture deformities of the right lamina papyracea and right orbital floor. Poor dentition with multifocal dental and periodontal disease. Incompletely assessed cervical spondylosis. Orbits: No orbital mass or acute orbital finding. Sinuses: Moderate mucosal thickening within the right frontal sinus. Mucosal thickening within the left frontal sinus inferiorly. The frontal sinus drainage pathways are  opacified, bilaterally. Bilateral ethmoid sinusitis (overall moderate). Mild mucosal thickening within the bilateral sphenoid sinuses. The sphenoethmoidal recesses are opacified, bilaterally. Moderate mucosal thickening within the bilateral maxillary sinuses. The ostiomeatal units are largely opacified, bilaterally. Soft tissues: The visualized maxillofacial soft tissues are unremarkable. Nonspecific bilateral upper cervical lymphadenopathy. For instance, a left level II lymph node measures 16 mm in short axis (series 5, image 36). Limited intracranial: No evidence of an acute intracranial abnormality within the field of view. Other: Leftward deviation of the bony nasal septum. Bilateral concha bullosa (opacified on the right, partially opacified on the left). Partial opacification of the left middle ear cavity. Left mastoid effusion. IMPRESSION: 1. Pansinusitis with multifocal obstruction of the sinus drainage pathways. 2. Partial opacification of the left middle ear cavity. Left mastoid effusion. 3. Chronic fracture deformities of the right lamina papyracea and right orbital floor. 4. Poor dentition with multifocal dental and periodontal disease. 5. Nonspecific upper cervical lymphadenopathy. Electronically Signed   By: Rockey Childs D.O.   On: 05/04/2024 09:09   CT CHEST W CONTRAST Result Date: 05/04/2024 CLINICAL DATA:  Respiratory illness. EXAM: CT CHEST WITH CONTRAST TECHNIQUE: Multidetector CT imaging of the chest was performed during intravenous contrast administration. RADIATION DOSE REDUCTION: This exam was performed according to the departmental dose-optimization program which includes automated exposure control, adjustment of the mA and/or kV according to patient size and/or use of iterative reconstruction technique. CONTRAST:  75mL OMNIPAQUE  IOHEXOL  350 MG/ML SOLN COMPARISON:  None Available. FINDINGS: Cardiovascular: The heart size is upper normal to borderline enlarged. No substantial pericardial  effusion. No thoracic aortic aneurysm. Mediastinum/Nodes: Substantial artifact of the mediastinum due to patient being scanned with her arms at the side. Within this limitation, no bulky mediastinal lymphadenopathy is evident. 10 mm left thyroid  nodule evident. Recommend thyroid  US  (ref: J Am Coll Radiol. 2015 Feb;12(2): 143-50).There is no hilar lymphadenopathy. Esophagus is largely obscured. There is no axillary lymphadenopathy. Lungs/Pleura: Retro hilar right lower lobe collapse/consolidative opacity evident although characterization limited by artifact. There is streaky collapse/consolidation in the medial lower lobes bilaterally, likely atelectasis. No substantial pleural effusion although trace pleural fluid could be obscured. Upper Abdomen: Substantial beam hardening artifact limits assessment. Musculoskeletal: No worrisome lytic or sclerotic osseous abnormality. IMPRESSION: 1. Markedly limited study due to substantial artifact from patient being scanned with her arms at her sides. Given this limitation, follow-up non emergent chest CT likely warranted when patient is better able to participate in positioning. 2. Retro hilar right lower lobe collapse/consolidative opacity evident although characterization limited by artifact. Imaging features could be related to pneumonia. Follow-up CT chest without contrast in 3 months recommended to ensure resolution. 3. Streaky collapse/consolidation in the medial lower lobes bilaterally, likely atelectasis. 4. 10 mm left thyroid  nodule. Recommend thyroid  US . Electronically Signed   By: Camellia Candle M.D.   On: 05/04/2024 09:06   DG Chest Port 1 View Result Date: 05/04/2024 CLINICAL  DATA:  Tachycardia EXAM: PORTABLE CHEST 1 VIEW COMPARISON:  05/03/2024 FINDINGS: Heart and mediastinal contours are within normal limits. Left basilar airspace opacity. Right lung clear. No effusions. No acute bony abnormality. IMPRESSION: Left basilar airspace opacity could reflect  atelectasis or pneumonia. Electronically Signed   By: Franky Crease M.D.   On: 05/04/2024 02:52   CT Head Wo Contrast Result Date: 05/03/2024 CLINICAL DATA:  Trauma EXAM: CT HEAD WITHOUT CONTRAST TECHNIQUE: Contiguous axial images were obtained from the base of the skull through the vertex without intravenous contrast. RADIATION DOSE REDUCTION: This exam was performed according to the departmental dose-optimization program which includes automated exposure control, adjustment of the mA and/or kV according to patient size and/or use of iterative reconstruction technique. COMPARISON:  MRI head 11/19/2023. FINDINGS: Brain: No acute intracranial hemorrhage. No CT evidence of acute infarct. Nonspecific hypoattenuation in the periventricular and subcortical white matter favored to reflect chronic microvascular ischemic changes. No edema, mass effect, or midline shift. The basilar cisterns are patent. Ventricles: The ventricles are normal. Vascular: No hyperdense vessel or unexpected calcification. Skull: No acute or aggressive finding. Orbits: Orbits are symmetric. Sinuses: Mucosal thickening throughout the paranasal sinuses particularly in the ethmoid sinuses, right greater than left. Mucosal thickening in the right frontal sinus. Mucosal thickening bilateral maxillary sinuses. Other: Trace fluid in the left mastoid air cells. IMPRESSION: No CT evidence of acute intracranial abnormality. White matter hypoattenuation which may reflect demyelinating process. Paranasal sinus disease as above. Electronically Signed   By: Donnice Mania M.D.   On: 05/03/2024 11:51   DG Chest Portable 1 View Result Date: 05/03/2024 CLINICAL DATA:  Shortness of breath. EXAM: PORTABLE CHEST 1 VIEW COMPARISON:  Chest radiograph dated 11/13/2022. FINDINGS: Shallow inspiration with bibasilar atelectasis. No focal consolidation, pleural effusion or pneumothorax. No acute osseous pathology. IMPRESSION: Shallow inspiration with bibasilar  atelectasis. Electronically Signed   By: Vanetta Chou M.D.   On: 05/03/2024 11:39    CBC Recent Labs  Lab 05/03/24 1118 05/03/24 1203 05/04/24 0629  WBC 17.4*  --  13.6*  HGB 13.4 14.3 10.6*  HCT 40.6 42.0 32.9*  PLT 254  --  193  MCV 88.3  --  90.6  MCH 29.1  --  29.2  MCHC 33.0  --  32.2  RDW 13.1  --  13.3  LYMPHSABS 0.3*  --   --   MONOABS 0.8  --   --   EOSABS 0.0  --   --   BASOSABS 0.0  --   --     Chemistries  Recent Labs  Lab 05/03/24 1118 05/03/24 1203 05/04/24 0629  NA 135 137 134*  K 3.8 3.7 3.7  CL 100 100 105  CO2 24  --  20*  GLUCOSE 119* 115* 116*  BUN 11 11 16   CREATININE 0.98 1.00 1.18*  CALCIUM  9.4  --  7.9*  MG  --   --  1.4*  AST 43*  --   --   ALT 40  --   --   ALKPHOS 66  --   --   BILITOT 1.2  --   --    ------------------------------------------------------------------------------------------------------------------ estimated creatinine clearance is 70.3 mL/min (A) (by C-G formula based on SCr of 1.18 mg/dL (H)). ------------------------------------------------------------------------------------------------------------------ No results for input(s): HGBA1C in the last 72 hours. ------------------------------------------------------------------------------------------------------------------ No results for input(s): CHOL, HDL, LDLCALC, TRIG, CHOLHDL, LDLDIRECT in the last 72 hours. ------------------------------------------------------------------------------------------------------------------ Recent Labs    05/04/24 0629  TSH 2.461   ------------------------------------------------------------------------------------------------------------------ No  results for input(s): VITAMINB12, FOLATE, FERRITIN, TIBC, IRON, RETICCTPCT in the last 72 hours.  Coagulation profile Recent Labs  Lab 05/03/24 1118  INR 1.1    No results for input(s): DDIMER in the last 72 hours.  Cardiac Enzymes No results for  input(s): CKMB, TROPONINI, MYOGLOBIN in the last 168 hours.  Invalid input(s): CK ------------------------------------------------------------------------------------------------------------------ Invalid input(s): POCBNP   Signature  Lavada Stank M.D on 05/04/2024 at 11:14 AM   -  To page go to [email or web address]   Total Critical Care time in examining the patient bedside, evaluating Lab work and other data, over half of the total time was spent in coordinating patient care on the floor or bedisde in talking to patient/family members, communicating with nursing Staff on the floor and sub specialists to coordinate patients medical care and needs is  35 Minutes.   Patient has potential for sudden clinically significant deterioration and can cause Potential Life threatening injury due to sepsis due to severe sinusitis, possible left mastoiditis, pneumonia and immunocompromise patient.

## 2024-05-04 NOTE — Plan of Care (Signed)
  Problem: Education: Goal: Knowledge of General Education information will improve Description: Including pain rating scale, medication(s)/side effects and non-pharmacologic comfort measures Outcome: Progressing   Problem: Coping: Goal: Level of anxiety will decrease Outcome: Progressing   Problem: Pain Managment: Goal: General experience of comfort will improve and/or be controlled Outcome: Progressing
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
## 2024-05-04 NOTE — Progress Notes (Signed)
 Pt achieved a NIF of -23 with great pt effort on all attempts.

## 2024-05-04 NOTE — Plan of Care (Signed)
 This is a patient with relapsing remitting MS recently started on Kesimpta unfortunately presenting with significant infection Also having frequent falls in that setting Potentially recrudescence in the setting of infection although infection can also trigger relapse. Appreciate primary team addressing her infection When she is able to tolerate MRI brain, MRI C-spine, MRI T-spine with and without contrast I would obtain these studies, if she is still having worsening balance or other neurological symptoms I do not think there is urgency to obtain the studies because her infection treatment needs to take priority and would not start further immunosuppression while she is so ill  Neurology is not following actively, please do not hesitate to reach out if acute questions or concerns arise  Lola Jernigan MD-PhD [facility] [phone] Available 7 AM to 7 PM, outside these hours please contact Neurologist on call listed on AMION   Discussed with Dr. Raenelle by phone and Dr. Dennise via secure chat

## 2024-05-04 NOTE — Progress Notes (Signed)
 Pt CPT on hold due to pt on Echo. RT will attempt at next sched time.

## 2024-05-04 NOTE — Progress Notes (Signed)
 PT Cancellation Note  Patient Details Name: Allison Moore MRN: [id number] DOB: December 31, 1980   Cancelled Treatment:    Reason Eval/Treat Not Completed: Other (comment);Patient at procedure or test/unavailable (Checked on pt multiple times and pt was off the floor for multiple scans. RN requesting PT to hold for today. Will follow up tomorrow if medically appropriate.)   Menachem Urbanek 05/04/2024, 3:01 PM
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
## 2024-05-04 NOTE — Progress Notes (Signed)
 PT Cancellation Note  Patient Details Name: Allison Moore MRN: [id number] DOB: [birth year]   Cancelled Treatment:    Reason Eval/Treat Not Completed: Patient at procedure or test/unavailable (Pt off the floor at CT. Will follow up tomorrow if time allows.)   Nayelli Inglis 05/04/2024, 8:42 AM

## 2024-05-05 ENCOUNTER — Inpatient Hospital Stay (HOSPITAL_COMMUNITY)

## 2024-05-05 DIAGNOSIS — A419 Sepsis, unspecified organism: Secondary | ICD-10-CM | POA: Diagnosis not present

## 2024-05-05 DIAGNOSIS — R9089 Other abnormal findings on diagnostic imaging of central nervous system: Secondary | ICD-10-CM

## 2024-05-05 DIAGNOSIS — G35 Multiple sclerosis: Secondary | ICD-10-CM | POA: Diagnosis not present

## 2024-05-05 DIAGNOSIS — R1310 Dysphagia, unspecified: Secondary | ICD-10-CM | POA: Diagnosis not present

## 2024-05-05 DIAGNOSIS — J329 Chronic sinusitis, unspecified: Secondary | ICD-10-CM | POA: Diagnosis not present

## 2024-05-05 DIAGNOSIS — H6091 Unspecified otitis externa, right ear: Secondary | ICD-10-CM

## 2024-05-05 DIAGNOSIS — R0603 Acute respiratory distress: Secondary | ICD-10-CM

## 2024-05-05 DIAGNOSIS — J189 Pneumonia, unspecified organism: Secondary | ICD-10-CM | POA: Diagnosis not present

## 2024-05-05 DIAGNOSIS — R296 Repeated falls: Secondary | ICD-10-CM | POA: Diagnosis not present

## 2024-05-05 DIAGNOSIS — H6691 Otitis media, unspecified, right ear: Secondary | ICD-10-CM

## 2024-05-05 DIAGNOSIS — J9601 Acute respiratory failure with hypoxia: Secondary | ICD-10-CM | POA: Diagnosis not present

## 2024-05-05 LAB — COMPREHENSIVE METABOLIC PANEL WITH GFR
ALT: 92 U/L — ABNORMAL HIGH (ref 0–44)
AST: 65 U/L — ABNORMAL HIGH (ref 15–41)
Albumin: 2.5 g/dL — ABNORMAL LOW (ref 3.5–5.0)
Alkaline Phosphatase: 78 U/L (ref 38–126)
Anion gap: 9 (ref 5–15)
BUN: 12 mg/dL (ref 6–20)
CO2: 20 mmol/L — ABNORMAL LOW (ref 22–32)
Calcium: 8.1 mg/dL — ABNORMAL LOW (ref 8.9–10.3)
Chloride: 108 mmol/L (ref 98–111)
Creatinine, Ser: 0.91 mg/dL (ref 0.44–1.00)
GFR, Estimated: >60 mL/min (ref >60)
Glucose, Bld: 96 mg/dL (ref 70–99)
Potassium: 3.5 mmol/L (ref 3.5–5.1)
Sodium: 137 mmol/L (ref 135–145)
Total Bilirubin: 1.5 mg/dL — ABNORMAL HIGH (ref 0.0–1.2)
Total Protein: 5.9 g/dL — ABNORMAL LOW (ref 6.5–8.1)

## 2024-05-05 LAB — CBC WITH DIFFERENTIAL/PLATELET
Abs Immature Granulocytes: 0.11 K/uL — ABNORMAL HIGH (ref 0.00–0.07)
Basophils Absolute: 0 K/uL (ref 0.0–0.1)
Basophils Relative: 0 %
Eosinophils Absolute: 0.2 K/uL (ref 0.0–0.5)
Eosinophils Relative: 2 %
HCT: 29.4 % — ABNORMAL LOW (ref 36.0–46.0)
Hemoglobin: 9.7 g/dL — ABNORMAL LOW (ref 12.0–15.0)
Immature Granulocytes: 1 %
Lymphocytes Relative: 4 %
Lymphs Abs: 0.6 K/uL — ABNORMAL LOW (ref 0.7–4.0)
MCH: 29.1 pg (ref 26.0–34.0)
MCHC: 33 g/dL (ref 30.0–36.0)
MCV: 88.3 fL (ref 80.0–100.0)
Monocytes Absolute: 0.5 K/uL (ref 0.1–1.0)
Monocytes Relative: 4 %
Neutro Abs: 12.1 K/uL — ABNORMAL HIGH (ref 1.7–7.7)
Neutrophils Relative %: 89 %
Platelets: 213 K/uL (ref 150–400)
RBC: 3.33 MIL/uL — ABNORMAL LOW (ref 3.87–5.11)
RDW: 13.8 % (ref 11.5–15.5)
WBC: 13.6 K/uL — ABNORMAL HIGH (ref 4.0–10.5)
nRBC: 0 % (ref 0.0–0.2)

## 2024-05-05 LAB — PROTIME-INR
INR: 1.3 — ABNORMAL HIGH (ref 0.8–1.2)
Prothrombin Time: 17.2 s — ABNORMAL HIGH (ref 11.4–15.2)

## 2024-05-05 LAB — PHOSPHORUS: Phosphorus: 2.9 mg/dL (ref 2.5–4.6)

## 2024-05-05 LAB — PROCALCITONIN: Procalcitonin: 1.57 ng/mL

## 2024-05-05 LAB — TYPE AND SCREEN
ABO/RH(D): O POS
Antibody Screen: NEGATIVE

## 2024-05-05 LAB — MAGNESIUM: Magnesium: 2.5 mg/dL — ABNORMAL HIGH (ref 1.7–2.4)

## 2024-05-05 LAB — C-REACTIVE PROTEIN: CRP: 21.5 mg/dL — ABNORMAL HIGH (ref <1.0)

## 2024-05-05 MED ORDER — CIPROFLOXACIN-DEXAMETHASONE 0.3-0.1 % OT SUSP: 4.0000 [drp] | Freq: Two times a day (BID) | OTIC | Status: DC

## 2024-05-05 MED ORDER — CIPROFLOXACIN-DEXAMETHASONE 0.3-0.1 % OT SUSP
4.0000 [drp] | Freq: Two times a day (BID) | OTIC | Status: DC
  Administered 2024-05-05 – 2024-05-11 (×13): 4 [drp] via OTIC
  Filled 2024-05-05: qty 7.5

## 2024-05-05 MED ORDER — POTASSIUM CHLORIDE IN NACL 20-0.9 MEQ/L-% IV SOLN
INTRAVENOUS | Status: DC
  Filled 2024-05-05: qty 1000

## 2024-05-05 MED ORDER — GADOBUTROL 1 MMOL/ML IV SOLN
10.0000 mL | Freq: Once | INTRAVENOUS | Status: AC | PRN
  Administered 2024-05-05: 10 mL via INTRAVENOUS

## 2024-05-05 MED ORDER — DEXTROSE 5 % IV SOLN
5.0000 mg/kg | Freq: Three times a day (TID) | INTRAVENOUS | Status: DC
  Administered 2024-05-06 – 2024-05-07 (×5): 365 mg via INTRAVENOUS
  Filled 2024-05-05 (×6): qty 7.3

## 2024-05-05 MED ORDER — OXYMETAZOLINE HCL 0.05 % NA SOLN
1.0000 | Freq: Two times a day (BID) | NASAL | Status: AC
  Administered 2024-05-05 – 2024-05-07 (×6): 1 via NASAL
  Filled 2024-05-05: qty 30
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
## 2024-05-05 NOTE — Progress Notes (Signed)
 PROGRESS NOTE        PATIENT DETAILS Name: Wendelyn Kiesling Age: 43 y.o. Sex: female Date of Birth: 07-Sep-1981 Admit Date: 05/03/2024 Admitting Physician Maximino DELENA Sharps, MD ERE:[email or web address], Rosina [name], PA  Brief Summary: Patient is a 43 y.o.  female with history of MS (walker at baseline)-who was on Kesimpta injections in May 2025 (?3 doses so far)-who presented to the ED for 4 to 5-day history of subjective fever, nasal congestion, shortness of breath, left ear watery discharge, odynophagia and frequent falls.  She was found to have pneumonia-with sepsis physiology and subsequently admitted to the hospitalist service.  Significant events: 7/10>> admit to [facility]-sepsis-frequent falls-PNA-URI symptoms-left ear discharge.  Significant studies: 7/10>> CT head: No acute intracranial abnormality 7/11>> CXR: Left basilar opacity.  Significant microbiology data: 7/10>> COVID/influenza/RSV PCR: Negative 7/10>> blood cultures: Pending 7/11>> respiratory virus panel: Negative  Procedures: None  Consults: ENT ID GI PCCM  Subjective: Patient in bed, no headache, no chest pain, cough and shortness of breath have improved, no abdominal pain no focal weakness  Objective: Vitals: Blood pressure 116/67, pulse (!) 114, temperature 99.4 F (37.4 C), temperature source Oral, resp. rate 18, height 5' 4 (1.626 m), weight 101 kg, SpO2 97%.   Exam:  Awake Alert, No new F.N deficits, Normal affect [place].AT,PERRAL Supple Neck, No JVD,   Symmetrical Chest wall movement, few bibasilar crackles RRR,No Gallops, Rubs or new Murmurs,  +ve B.Sounds, Abd Soft, No tenderness,   No Cyanosis, Clubbing or edema    Assessment/Plan:  Sepsis-with left otitis externa, acute on chronic sinusitis, possible aspiration pneumonia, possible esophagitis in a patient who is immunocompromised due to being on ofatumumab injections for multiple sclerosis  He has been seen by ENT Dr. Roark, PCCM  Dr. Shelah, GI Dr. Marinda and ID Dr. Lindia, case discussed with ENT and PCCM, currently on empiric IV antibiotics, IV Diflucan  and IV acyclovir , Cipro  eardrops to the left ear ordered after discussions with ENT, clinically she has shown improvement on 05/05/2024.  Sepsis pathophysiology is resolving, speech therapy also following closely as aspiration of oral secretions is high on the list.  Currently n.p.o. with speech following, continue IV fluids.  GI also contemplating possible EGD later this admission.  Ongoing dysphagia and some odynophagia.  Currently n.p.o., speech GI on board.  On fluconazole  for high risk of Candida esophagitis due to being immunocompromised, defer further GI workup to gastroenterology team  History of MS with multiple falls Discussed with neurology-no headache, no meningismus, no photophobia, MRI brain nonacute, MRI C and T-spine pending.  Postdischarge follow-up with primary neurology for MS follow-up.  Hypomagnesemia.  Replaced.    HLD Statin  Schizoaffective disorder Anxiety/depression Anxiety slightly worse due to acute illness Continue Zoloft /risperidone   Class 2 Obesity: Estimated body mass index is 38.22 kg/m as calculated from the following:   Height as of this encounter: 5' 4 (1.626 m).   Weight as of this encounter: 101 kg.   Code status:   Code Status: Full Code   DVT Prophylaxis: enoxaparin  (LOVENOX ) injection 40 mg Start: 05/04/24 1000 SCDs Start: 05/03/24 1357    Family Communication: Father-Lester [phone] updated site 05/05/2024   Disposition Plan: Status is: Inpatient Remains inpatient appropriate because: Severity of illness   Planned Discharge Destination:Home health versus SNF   Diet: Diet Order  Diet NPO time specified Except for: Ice Chips  Diet effective now                     Data Review:   Inpatient Medications  Scheduled Meds:  atorvastatin   20 mg Oral QHS    ciprofloxacin -dexamethasone   4 drop Left EAR BID   enoxaparin  (LOVENOX ) injection  40 mg Subcutaneous Daily   gabapentin   300 mg Oral TID   guaiFENesin   600 mg Oral BID   ipratropium-albuterol   3 mL Nebulization Q6H   magic mouthwash w/lidocaine   15 mL Oral TID AC & HS   midodrine   10 mg Oral TID WC   multivitamin with minerals  1 tablet Oral Daily   oxymetazoline   1 spray Each Nare BID   pantoprazole  (PROTONIX ) IV  40 mg Intravenous Q12H   risperiDONE   3 mg Oral QHS   sertraline   150 mg Oral Daily   sodium chloride  HYPERTONIC  4 mL Nebulization Daily   Continuous Infusions:  0.9 % NaCl with KCl 20 mEq / L     acetaminophen      acyclovir  Stopped (05/05/24 0441)   fluconazole  (DIFLUCAN ) IV 400 mg (05/04/24 1633)   piperacillin -tazobactam (ZOSYN )  IV 3.375 g (05/05/24 0443)   PRN Meds:.acetaminophen , albuterol , hydrOXYzine , sodium chloride   DVT Prophylaxis  enoxaparin  (LOVENOX ) injection 40 mg Start: 05/04/24 1000 SCDs Start: 05/03/24 1357   Recent Labs  Lab 05/03/24 1118 05/03/24 1203 05/04/24 0629 05/05/24 0550  WBC 17.4*  --  13.6* 13.6*  HGB 13.4 14.3 10.6* 9.7*  HCT 40.6 42.0 32.9* 29.4*  PLT 254  --  193 213  MCV 88.3  --  90.6 88.3  MCH 29.1  --  29.2 29.1  MCHC 33.0  --  32.2 33.0  RDW 13.1  --  13.3 13.8  LYMPHSABS 0.3*  --   --  0.6*  MONOABS 0.8  --   --  0.5  EOSABS 0.0  --   --  0.2  BASOSABS 0.0  --   --  0.0    Recent Labs  Lab 05/03/24 1118 05/03/24 1203 05/03/24 1204 05/03/24 1402 05/04/24 0629 05/04/24 1540 05/05/24 0550  NA 135 137  --   --  134*  --  137  K 3.8 3.7  --   --  3.7  --  3.5  CL 100 100  --   --  105  --  108  CO2 24  --   --   --  20*  --  20*  ANIONGAP 11  --   --   --  9  --  9  GLUCOSE 119* 115*  --   --  116*  --  96  BUN 11 11  --   --  16  --  12  CREATININE 0.98 1.00  --   --  1.18*  --  0.91  AST 43*  --   --   --   --   --  65*  ALT 40  --   --   --   --   --  92*  ALKPHOS 66  --   --   --   --   --  78   BILITOT 1.2  --   --   --   --   --  1.5*  ALBUMIN 3.6  --   --   --   --   --  2.5*  CRP  --   --   --   --   --   --  21.5*  PROCALCITON  --   --   --  1.97 2.24  --   --   LATICACIDVEN  --   --  1.2  --  1.7 1.4  --   INR 1.1  --   --   --   --   --   --   TSH  --   --   --   --  2.461  --   --   BNP  --   --   --   --  99.1  --   --   MG  --   --   --   --  1.4*  --  2.5*  PHOS  --   --   --   --   --   --  2.9  CALCIUM  9.4  --   --   --  7.9*  --  8.1*      Recent Labs  Lab 05/03/24 1118 05/03/24 1204 05/03/24 1402 05/04/24 0629 05/04/24 1540 05/05/24 0550  CRP  --   --   --   --   --  21.5*  PROCALCITON  --   --  1.97 2.24  --   --   LATICACIDVEN  --  1.2  --  1.7 1.4  --   INR 1.1  --   --   --   --   --   TSH  --   --   --  2.461  --   --   BNP  --   --   --  99.1  --   --   MG  --   --   --  1.4*  --  2.5*  CALCIUM  9.4  --   --  7.9*  --  8.1*    --------------------------------------------------------------------------------------------------------------- Lab Results  Component Value Date   CHOL 176 11/15/2023   HDL 50 11/15/2023   LDLCALC 114 (H) 11/15/2023   TRIG 62 11/15/2023   CHOLHDL 3.5 11/15/2023    Lab Results  Component Value Date   HGBA1C 5.1 11/15/2023   Recent Labs    05/04/24 0629  TSH 2.461   No results for input(s): VITAMINB12, FOLATE, FERRITIN, TIBC, IRON, RETICCTPCT in the last 72 hours. ------------------------------------------------------------------------------------------------------------------ Cardiac Enzymes No results for input(s): CKMB, TROPONINI, MYOGLOBIN in the last 168 hours.  Invalid input(s): CK  Micro Results Recent Results (from the past 240 hours)  Blood Culture (routine x 2)     Status: None (Preliminary result)   Collection Time: 05/03/24 11:23 AM   Specimen: BLOOD  Result Value Ref Range Status   Specimen Description BLOOD SITE NOT SPECIFIED  Final   Special Requests   Final     BOTTLES DRAWN AEROBIC AND ANAEROBIC Blood Culture adequate volume   Culture   Final    NO GROWTH < 24 HOURS Performed at [facility], [address], [place], [place] [address]    Report Status PENDING  Incomplete  Blood Culture (routine x 2)     Status: None (Preliminary result)   Collection Time: 05/03/24 11:43 AM   Specimen: BLOOD LEFT ARM  Result Value Ref Range Status   Specimen Description BLOOD LEFT ARM  Final   Special Requests   Final    BOTTLES DRAWN AEROBIC AND ANAEROBIC Blood Culture adequate volume   Culture   Final    NO GROWTH 1 DAY Performed at [facility], [address] [place], [place]  [address]    Report Status PENDING  Incomplete  Resp panel by RT-PCR (RSV, Flu A&B, Covid) Anterior Nasal Swab     Status: None   Collection Time: 05/03/24  1:11 PM   Specimen: Anterior Nasal Swab  Result Value Ref Range Status   SARS Coronavirus 2 by RT PCR NEGATIVE NEGATIVE Final   Influenza A by PCR NEGATIVE NEGATIVE Final   Influenza B by PCR NEGATIVE NEGATIVE Final    Comment: (NOTE) The Xpert Xpress SARS-CoV-2/FLU/RSV plus assay is intended as an aid in the diagnosis of influenza from Nasopharyngeal swab specimens and should not be used as a sole basis for treatment. Nasal washings and aspirates are unacceptable for Xpert Xpress SARS-CoV-2/FLU/RSV testing.  Fact Sheet for Patients: BloggerCourse.com  Fact Sheet for Healthcare Providers: SeriousBroker.it  This test is not yet approved or cleared by the [place]  FDA and has been authorized for detection and/or diagnosis of SARS-CoV-2 by FDA under an Emergency Use Authorization (EUA). This EUA will remain in effect (meaning this test can be used) for the duration of the COVID-19 declaration under Section 564(b)(1) of the Act, 21 U.S.C. section 360bbb-3(b)(1), unless the authorization is terminated or revoked.     Resp Syncytial Virus by PCR NEGATIVE  NEGATIVE Final    Comment: (NOTE) Fact Sheet for Patients: BloggerCourse.com  Fact Sheet for Healthcare Providers: SeriousBroker.it  This test is not yet approved or cleared by the [place]  FDA and has been authorized for detection and/or diagnosis of SARS-CoV-2 by FDA under an Emergency Use Authorization (EUA). This EUA will remain in effect (meaning this test can be used) for the duration of the COVID-19 declaration under Section 564(b)(1) of the Act, 21 U.S.C. section 360bbb-3(b)(1), unless the authorization is terminated or revoked.  Performed at [facility], [address] [place], [place] [address]   MRSA Next Gen by PCR, Nasal     Status: None   Collection Time: 05/03/24 10:20 PM   Specimen: Nasal Mucosa; Nasal Swab  Result Value Ref Range Status   MRSA by PCR Next Gen NOT DETECTED NOT DETECTED Final    Comment: (NOTE) The GeneXpert MRSA Assay (FDA approved for NASAL specimens only), is one component of a comprehensive MRSA colonization surveillance program. It is not intended to diagnose MRSA infection nor to guide or monitor treatment for MRSA infections. Test performance is not FDA approved in patients less than 41 years old. Performed at [facility], [address] [place], [place] [address]   Respiratory (~20 pathogens) panel by PCR     Status: None   Collection Time: 05/04/24  6:20 AM   Specimen: Nasopharyngeal Swab; Respiratory  Result Value Ref Range Status   Adenovirus NOT DETECTED NOT DETECTED Final   Coronavirus 229E NOT DETECTED NOT DETECTED Final    Comment: (NOTE) The Coronavirus on the Respiratory Panel, DOES NOT test for the novel  Coronavirus (2019 nCoV)    Coronavirus HKU1 NOT DETECTED NOT DETECTED Final   Coronavirus NL63 NOT DETECTED NOT DETECTED Final   Coronavirus OC43 NOT DETECTED NOT DETECTED Final   Metapneumovirus NOT DETECTED NOT DETECTED Final   Rhinovirus /  Enterovirus NOT DETECTED NOT DETECTED Final   Influenza A NOT DETECTED NOT DETECTED Final   Influenza B NOT DETECTED NOT DETECTED Final   Parainfluenza Virus 1 NOT DETECTED NOT DETECTED Final   Parainfluenza Virus 2 NOT DETECTED NOT DETECTED Final   Parainfluenza Virus 3 NOT DETECTED NOT DETECTED Final   Parainfluenza  Virus 4 NOT DETECTED NOT DETECTED Final   Respiratory Syncytial Virus NOT DETECTED NOT DETECTED Final   Bordetella pertussis NOT DETECTED NOT DETECTED Final   Bordetella Parapertussis NOT DETECTED NOT DETECTED Final   Chlamydophila pneumoniae NOT DETECTED NOT DETECTED Final   Mycoplasma pneumoniae NOT DETECTED NOT DETECTED Final    Comment: Performed at [facility], [address], [place], [place] [address]    Radiology Reports  DG Chest [place] 1 View Result Date: 05/05/2024 CLINICAL DATA:  Shortness of breath. EXAM: PORTABLE CHEST 1 VIEW COMPARISON:  05/04/2024 FINDINGS: Low lung volumes. Cardiopericardial silhouette is at upper limits of normal for size. Vascular congestion noted with possible interstitial edema. Trace pleural effusions suspected. No acute bony abnormality. Telemetry leads overlie the chest. IMPRESSION: Low volume film with vascular congestion and possible interstitial edema. Electronically Signed   By: Camellia Candle M.D.   On: 05/05/2024 08:27   MR BRAIN WO CONTRAST Result Date: 05/04/2024 CLINICAL DATA:  Headache, fever, frequent falls. Concern for sepsis. History of MS. EXAM: MRI HEAD WITHOUT CONTRAST TECHNIQUE: Multiplanar, multiecho pulse sequences of the brain and surrounding structures were obtained without intravenous contrast. COMPARISON:  MRI head 11/19/2023. FINDINGS: Brain: Limited sequences obtained, patient unable to finish examination. No restricted diffusion. Redemonstrated T2/FLAIR hyperintensity in the periventricular and deep white matter similar to the prior study. Few scattered foci of signal abnormality in the cerebellum again noted.  No edema, mass effect, or midline shift. Similar appearance of generalized parenchymal volume loss. The basilar cisterns are patent. No extra-axial fluid collections. Ventricles: Prominence of the lateral ventricles suggestive of underlying parenchymal volume loss. Vascular: Skull base flow voids are visualized. Skull and upper cervical spine: No focal abnormality. Sinuses/Orbits: Orbits are symmetric. Mucosal thickening throughout the paranasal sinuses most pronounced in the ethmoid and maxillary sinuses. Other: Left mastoid effusion is new from prior. IMPRESSION: Examination limited due to motion artifact and limited sequences obtained. No acute infarct. Similar white matter signal abnormality suggestive of demyelinating process. Unable to assess for active demyelination given lack of IV contrast. New left mastoid effusion. Paranasal sinus disease is increased from prior. Electronically Signed   By: Donnice Mania M.D.   On: 05/04/2024 14:29   ECHOCARDIOGRAM COMPLETE Result Date: 05/04/2024    ECHOCARDIOGRAM REPORT   Patient Name:   GIAH FICKETT Date of Exam: 05/04/2024 Medical Rec #:  [id number]          Height:       64.0 in Accession #:    [id number]         Weight:       222.7 lb Date of Birth:  [birth year]           BSA:          2.048 m Patient Age:    43 years           BP:           93/49 mmHg Patient Gender: F                  HR:           123 bpm. Exam Location:  Inpatient Procedure: 2D Echo, Cardiac Doppler and Color Doppler (Both Spectral and Color            Flow Doppler were utilized during procedure). Indications:    Acutre respiratory distress R06.03  History:        Patient has no prior history of Echocardiogram examinations.  Risk Factors:Hyperlipidimia.  Sonographer:    Benard Stallion Referring Phys: [id number] DONALDA M GHIMIRE IMPRESSIONS  1. Left ventricular ejection fraction, by estimation, is 60 to 65%. The left ventricle has normal function. The left ventricle has no  regional wall motion abnormalities. Left ventricular diastolic parameters were normal.  2. Right ventricular systolic function is normal. The right ventricular size is normal.  3. The mitral valve is normal in structure. Trivial mitral valve regurgitation.  4. The aortic valve is tricuspid. Aortic valve regurgitation is not visualized. No aortic stenosis is present. FINDINGS  Left Ventricle: Left ventricular ejection fraction, by estimation, is 60 to 65%. The left ventricle has normal function. The left ventricle has no regional wall motion abnormalities. The left ventricular internal cavity size was normal in size. There is  no left ventricular hypertrophy. Left ventricular diastolic parameters were normal. Right Ventricle: The right ventricular size is normal. No increase in right ventricular wall thickness. Right ventricular systolic function is normal. Left Atrium: Left atrial size was normal in size. Right Atrium: Right atrial size was normal in size. Pericardium: There is no evidence of pericardial effusion. Mitral Valve: The mitral valve is normal in structure. Trivial mitral valve regurgitation. Tricuspid Valve: The tricuspid valve is normal in structure. Tricuspid valve regurgitation is trivial. Aortic Valve: The aortic valve is tricuspid. Aortic valve regurgitation is not visualized. No aortic stenosis is present. Aortic valve mean gradient measures 6.0 mmHg. Aortic valve peak gradient measures 11.6 mmHg. Aortic valve area, by VTI measures 3.10  cm. Pulmonic Valve: The pulmonic valve was grossly normal. Pulmonic valve regurgitation is trivial. Aorta: The aortic root and ascending aorta are structurally normal, with no evidence of dilitation. IAS/Shunts: The interatrial septum was not well visualized.  LEFT VENTRICLE PLAX 2D LVIDd:         4.40 cm   Diastology LVIDs:         3.00 cm   LV e' medial:    10.20 cm/s LV PW:         0.90 cm   LV E/e' medial:  9.2 LV IVS:        0.90 cm   LV e' lateral:   14.40  cm/s LVOT diam:     2.20 cm   LV E/e' lateral: 6.5 LV SV:         71 LV SV Index:   35 LVOT Area:     3.80 cm  RIGHT VENTRICLE RV S prime:     24.40 cm/s TAPSE (M-mode): 2.2 cm LEFT ATRIUM             Index        RIGHT ATRIUM           Index LA diam:        3.60 cm 1.76 cm/m   RA Area:     10.60 cm LA Vol (A2C):   38.4 ml 18.75 ml/m  RA Volume:   18.60 ml  9.08 ml/m LA Vol (A4C):   30.2 ml 14.75 ml/m LA Biplane Vol: 34.0 ml 16.60 ml/m  AORTIC VALVE AV Area (Vmax):    2.95 cm AV Area (Vmean):   3.02 cm AV Area (VTI):     3.10 cm AV Vmax:           170.00 cm/s AV Vmean:          117.500 cm/s AV VTI:            0.230 m AV Peak Grad:  11.6 mmHg AV Mean Grad:      6.0 mmHg LVOT Vmax:         132.00 cm/s LVOT Vmean:        93.300 cm/s LVOT VTI:          0.188 m LVOT/AV VTI ratio: 0.82  AORTA Ao Root diam: 2.90 cm Ao Asc diam:  3.20 cm MITRAL VALVE               TRICUSPID VALVE MV Area (PHT): 5.97 cm    TR Peak grad:   9.0 mmHg MV Decel Time: 127 msec    TR Vmax:        150.00 cm/s MV E velocity: 94.00 cm/s MV A velocity: 88.20 cm/s  SHUNTS MV E/A ratio:  1.07        Systemic VTI:  0.19 m                            Systemic Diam: 2.20 cm Lonni Nanas MD Electronically signed by Lonni Nanas MD Signature Date/Time: 05/04/2024/12:46:35 PM    Final    CT MAXILLOFACIAL WO CONTRAST Result Date: 05/04/2024 CLINICAL DATA:  Provided history: Mastication paralysis/weakness. Immunocompromised patient. Ear pain. EXAM: CT MAXILLOFACIAL WITHOUT CONTRAST TECHNIQUE: Multidetector CT imaging of the maxillofacial structures was performed. Multiplanar CT image reconstructions were also generated. RADIATION DOSE REDUCTION: This exam was performed according to the departmental dose-optimization program which includes automated exposure control, adjustment of the mA and/or kV according to patient size and/or use of iterative reconstruction technique. COMPARISON:  Brain MRI 11/19/2023. Head CT 09/03/2011.  Maxillofacial CT 05/14/2010. FINDINGS: Osseous: Redemonstrated chronic fracture deformities of the right lamina papyracea and right orbital floor. Poor dentition with multifocal dental and periodontal disease. Incompletely assessed cervical spondylosis. Orbits: No orbital mass or acute orbital finding. Sinuses: Moderate mucosal thickening within the right frontal sinus. Mucosal thickening within the left frontal sinus inferiorly. The frontal sinus drainage pathways are opacified, bilaterally. Bilateral ethmoid sinusitis (overall moderate). Mild mucosal thickening within the bilateral sphenoid sinuses. The sphenoethmoidal recesses are opacified, bilaterally. Moderate mucosal thickening within the bilateral maxillary sinuses. The ostiomeatal units are largely opacified, bilaterally. Soft tissues: The visualized maxillofacial soft tissues are unremarkable. Nonspecific bilateral upper cervical lymphadenopathy. For instance, a left level II lymph node measures 16 mm in short axis (series 5, image 36). Limited intracranial: No evidence of an acute intracranial abnormality within the field of view. Other: Leftward deviation of the bony nasal septum. Bilateral concha bullosa (opacified on the right, partially opacified on the left). Partial opacification of the left middle ear cavity. Left mastoid effusion. IMPRESSION: 1. Pansinusitis with multifocal obstruction of the sinus drainage pathways. 2. Partial opacification of the left middle ear cavity. Left mastoid effusion. 3. Chronic fracture deformities of the right lamina papyracea and right orbital floor. 4. Poor dentition with multifocal dental and periodontal disease. 5. Nonspecific upper cervical lymphadenopathy. Electronically Signed   By: Rockey Childs D.O.   On: 05/04/2024 09:09   CT CHEST W CONTRAST Result Date: 05/04/2024 CLINICAL DATA:  Respiratory illness. EXAM: CT CHEST WITH CONTRAST TECHNIQUE: Multidetector CT imaging of the chest was performed during  intravenous contrast administration. RADIATION DOSE REDUCTION: This exam was performed according to the departmental dose-optimization program which includes automated exposure control, adjustment of the mA and/or kV according to patient size and/or use of iterative reconstruction technique. CONTRAST:  75mL OMNIPAQUE  IOHEXOL  350 MG/ML SOLN COMPARISON:  None Available. FINDINGS: Cardiovascular: The  heart size is upper normal to borderline enlarged. No substantial pericardial effusion. No thoracic aortic aneurysm. Mediastinum/Nodes: Substantial artifact of the mediastinum due to patient being scanned with her arms at the side. Within this limitation, no bulky mediastinal lymphadenopathy is evident. 10 mm left thyroid  nodule evident. Recommend thyroid  US  (ref: J Am Coll Radiol. 2015 Feb;12(2): 143-50).There is no hilar lymphadenopathy. Esophagus is largely obscured. There is no axillary lymphadenopathy. Lungs/Pleura: Retro hilar right lower lobe collapse/consolidative opacity evident although characterization limited by artifact. There is streaky collapse/consolidation in the medial lower lobes bilaterally, likely atelectasis. No substantial pleural effusion although trace pleural fluid could be obscured. Upper Abdomen: Substantial beam hardening artifact limits assessment. Musculoskeletal: No worrisome lytic or sclerotic osseous abnormality. IMPRESSION: 1. Markedly limited study due to substantial artifact from patient being scanned with her arms at her sides. Given this limitation, follow-up non emergent chest CT likely warranted when patient is better able to participate in positioning. 2. Retro hilar right lower lobe collapse/consolidative opacity evident although characterization limited by artifact. Imaging features could be related to pneumonia. Follow-up CT chest without contrast in 3 months recommended to ensure resolution. 3. Streaky collapse/consolidation in the medial lower lobes bilaterally, likely  atelectasis. 4. 10 mm left thyroid  nodule. Recommend thyroid  US . Electronically Signed   By: Camellia Candle M.D.   On: 05/04/2024 09:06   DG Chest Port 1 View Result Date: 05/04/2024 CLINICAL DATA:  Tachycardia EXAM: PORTABLE CHEST 1 VIEW COMPARISON:  05/03/2024 FINDINGS: Heart and mediastinal contours are within normal limits. Left basilar airspace opacity. Right lung clear. No effusions. No acute bony abnormality. IMPRESSION: Left basilar airspace opacity could reflect atelectasis or pneumonia. Electronically Signed   By: Franky Crease M.D.   On: 05/04/2024 02:52   CT Head Wo Contrast Result Date: 05/03/2024 CLINICAL DATA:  Trauma EXAM: CT HEAD WITHOUT CONTRAST TECHNIQUE: Contiguous axial images were obtained from the base of the skull through the vertex without intravenous contrast. RADIATION DOSE REDUCTION: This exam was performed according to the departmental dose-optimization program which includes automated exposure control, adjustment of the mA and/or kV according to patient size and/or use of iterative reconstruction technique. COMPARISON:  MRI head 11/19/2023. FINDINGS: Brain: No acute intracranial hemorrhage. No CT evidence of acute infarct. Nonspecific hypoattenuation in the periventricular and subcortical white matter favored to reflect chronic microvascular ischemic changes. No edema, mass effect, or midline shift. The basilar cisterns are patent. Ventricles: The ventricles are normal. Vascular: No hyperdense vessel or unexpected calcification. Skull: No acute or aggressive finding. Orbits: Orbits are symmetric. Sinuses: Mucosal thickening throughout the paranasal sinuses particularly in the ethmoid sinuses, right greater than left. Mucosal thickening in the right frontal sinus. Mucosal thickening bilateral maxillary sinuses. Other: Trace fluid in the left mastoid air cells. IMPRESSION: No CT evidence of acute intracranial abnormality. White matter hypoattenuation which may reflect demyelinating  process. Paranasal sinus disease as above. Electronically Signed   By: Donnice Mania M.D.   On: 05/03/2024 11:51   DG Chest Portable 1 View Result Date: 05/03/2024 CLINICAL DATA:  Shortness of breath. EXAM: PORTABLE CHEST 1 VIEW COMPARISON:  Chest radiograph dated 11/13/2022. FINDINGS: Shallow inspiration with bibasilar atelectasis. No focal consolidation, pleural effusion or pneumothorax. No acute osseous pathology. IMPRESSION: Shallow inspiration with bibasilar atelectasis. Electronically Signed   By: Vanetta Chou M.D.   On: 05/03/2024 11:39      Signature  -   Lavada Stank M.D on 05/05/2024 at 8:35 AM   -  To page go to [email or web address]

## 2024-05-05 NOTE — Progress Notes (Signed)
 OT Cancellation Note  Patient Details Name: Allison Moore MRN: [id number] DOB: 1981-09-02   Cancelled Treatment:    Reason Eval/Treat Not Completed: Patient at procedure or test/ unavailable. Pt off unit for MRI, OT will follow up next available time  Jacques Karna Loose 05/05/2024, 10:38 AM

## 2024-05-05 NOTE — Plan of Care (Signed)
  Problem: Health Behavior/Discharge Planning: Goal: Ability to manage health-related needs will improve Outcome: Progressing   Problem: Clinical Measurements: Goal: Will remain free from infection Outcome: Progressing   Problem: Coping: Goal: Level of anxiety will decrease Outcome: Progressing   Problem: Safety: Goal: Ability to remain free from injury will improve Outcome: Progressing   

## 2024-05-05 NOTE — Progress Notes (Signed)
 NIF -20 with good efforts.

## 2024-05-05 NOTE — Progress Notes (Signed)
 SLP Cancellation Note  Patient Details Name: Allison Moore MRN: [id number] DOB: [birth year]   Cancelled treatment:        Pt heading to MRI at time of SLP arrival, will f/u when available if time permits.  Manuelita Blew M.S. CCC-SLP
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
## 2024-05-05 NOTE — Evaluation (Signed)
 Physical Therapy Evaluation Patient Details Name: Allison Moore MRN: [id number] DOB: Aug 30, 1981 Today's Date: 05/05/2024  History of Present Illness  Pt is 43 year old [facility] for weakness and falls. Pt with sepsis with lt otitis externa, acute sinusitis, possible aspiration PNA and possible esophagitis. PMH - MS,anxiety, depression, obesity, tibial plateau fx  Clinical Impression  Pt admitted with above diagnosis and presents to PT with functional limitations due to deficits listed below (See PT problem list). Pt needs skilled PT to maximize independence and safety. At baseline pt lives with a group of women in a house and she has been amb with rolling walker and preparing her own meals. Currently she requires mod assist to stand and turn to chair. Her legs didn't feel stable enough to attempt amb with 1 person assist today. Pt reports that the people she shares a house with can assist her physically at dc. Mother does not believe this is the case and reports that pt will need to be modified independent to return to that living situation. Patient will benefit from continued inpatient follow up therapy, <3 hours/day.           If plan is discharge home, recommend the following: A lot of help with walking and/or transfers;A little help with bathing/dressing/bathroom;Assistance with cooking/housework;Help with stairs or ramp for entrance;Assist for transportation   Can travel by private vehicle   Yes    Equipment Recommendations Wheelchair (measurements PT);Wheelchair cushion (measurements PT)  Recommendations for Other Services       Functional Status Assessment Patient has had a recent decline in their functional status and demonstrates the ability to make significant improvements in function in a reasonable and predictable amount of time.     Precautions / Restrictions Precautions Precautions: Fall Recall of Precautions/Restrictions:  Impaired Restrictions Weight Bearing Restrictions Per Provider Order: No      Mobility  Bed Mobility Overal bed mobility: Needs Assistance Bed Mobility: Supine to Sit     Supine to sit: Mod assist, HOB elevated     General bed mobility comments: Assist to bring legs off of bed, elevate trunk into sitting and bring hips to EOB.    Transfers Overall transfer level: Needs assistance Equipment used: Rolling walker (2 wheels) Transfers: Sit to/from Stand, Bed to chair/wheelchair/BSC Sit to Stand: Mod assist   Step pivot transfers: Min assist       General transfer comment: Assist to power up and stabilize. Pt with wide base with shuffling steps bed to bsc to recliner.    Ambulation/Gait               General Gait Details: Did not attempt with 1 person assist.  Stairs            Wheelchair Mobility     Tilt Bed    Modified Rankin (Stroke Patients Only)       Balance Overall balance assessment: Needs assistance Sitting-balance support: No upper extremity supported, Feet supported Sitting balance-Leahy Scale: Fair     Standing balance support: Bilateral upper extremity supported Standing balance-Leahy Scale: Poor Standing balance comment: walker and min assist for static standing                             Pertinent Vitals/Pain Pain Assessment Pain Assessment: No/denies pain    Home Living Family/patient expects to be discharged to:: Private residence Living Arrangements: Non-relatives/Friends (5 other women) Available Help at Discharge: Available  PRN/intermittently Type of Home: House Home Access: Stairs to enter Entrance Stairs-Rails: Right;Left;Can reach both Entrance Stairs-Number of Steps: 5     Home Equipment: Agricultural consultant (2 wheels)      Prior Function Prior Level of Function : History of Falls (last six months)             Mobility Comments: Pt reports she uses rolling walker modified independent. Multiple  falls       Extremity/Trunk Assessment   Upper Extremity Assessment Upper Extremity Assessment: Defer to OT evaluation    Lower Extremity Assessment Lower Extremity Assessment: Generalized weakness       Communication   Communication Communication: Impaired Factors Affecting Communication: Difficulty expressing self;Reduced clarity of speech    Cognition Arousal: Alert Behavior During Therapy: Flat affect   PT - Cognitive impairments: Problem solving, Safety/Judgement                       PT - Cognition Comments: Slow processing and decr awareness of deficits Following commands: Impaired Following commands impaired: Only follows one step commands consistently, Follows one step commands with increased time     Cueing Cueing Techniques: Verbal cues, Tactile cues     General Comments General comments (skin integrity, edema, etc.): VSS on RA    Exercises     Assessment/Plan    PT Assessment Patient needs continued PT services  PT Problem List Decreased strength;Decreased activity tolerance;Decreased balance;Decreased mobility;Decreased cognition;Obesity       PT Treatment Interventions DME instruction;Gait training;Functional mobility training;Therapeutic activities;Therapeutic exercise;Balance training;Cognitive remediation;Patient/family education    PT Goals (Current goals can be found in the Care Plan section)  Acute Rehab PT Goals Patient Stated Goal: return home PT Goal Formulation: With patient Time For Goal Achievement: 05/19/24 Potential to Achieve Goals: Fair    Frequency Min 2X/week     Co-evaluation               AM-PAC PT 6 Clicks Mobility  Outcome Measure Help needed turning from your back to your side while in a flat bed without using bedrails?: A Lot Help needed moving from lying on your back to sitting on the side of a flat bed without using bedrails?: A Lot Help needed moving to and from a bed to a chair (including a  wheelchair)?: A Lot Help needed standing up from a chair using your arms (e.g., wheelchair or bedside chair)?: A Lot Help needed to walk in hospital room?: Total Help needed climbing 3-5 steps with a railing? : Total 6 Click Score: 10    End of Session Equipment Utilized During Treatment: Gait belt Activity Tolerance: Patient tolerated treatment well Patient left: in chair;with call bell/phone within reach;with chair alarm set Nurse Communication: Mobility status PT Visit Diagnosis: Unsteadiness on feet (R26.81);Other abnormalities of gait and mobility (R26.89);Repeated falls (R29.6);Muscle weakness (generalized) (M62.81)    Time: 8752-8662 PT Time Calculation (min) (ACUTE ONLY): 50 min   Charges:   PT Evaluation $PT Eval Moderate Complexity: 1 Mod PT Treatments $Therapeutic Activity: 23-37 mins PT General Charges $$ ACUTE PT VISIT: 1 Visit         [facility] PT Acute Rehabilitation Services Office [phone]   Rodgers ORN [facility] 05/05/2024, 5:07 PM

## 2024-05-05 NOTE — Progress Notes (Signed)
 Pt achieved a NIF of -20 with great pt effort on all attempts. MD notified.

## 2024-05-05 NOTE — Progress Notes (Signed)
 NAME:  Allison Moore, MRN:  [id number], DOB:  [birth year], LOS: 2 ADMISSION DATE:  05/03/2024, CONSULTATION DATE:  05/04/24 REFERRING MD:  Dr Dennise, CHIEF COMPLAINT:  dyspnea, respiratory distress   Subjective/interval: - Doing better today - Was made n.p.o. on 7/11, also started on a more aggressive pulmonary hygiene regimen - Acknowledges that she is hungry, wonders when she will be able to safely swallow  Objective    Blood pressure 110/67, pulse (!) 104, temperature 98.2 F (36.8 C), temperature source Oral, resp. rate (!) 21, height 5' 4 (1.626 m), weight 101 kg, SpO2 99%.        Intake/Output Summary (Last 24 hours) at 05/05/2024 1228 Last data filed at 05/05/2024 0340 Gross per 24 hour  Intake --  Output 250 ml  Net -250 ml   Filed Weights   05/03/24 1136  Weight: 101 kg    Examination: General: Chronically ill-appearing obese woman laying in bed, more comfortable than 7/11 HENT: Strong voice, fewer secretions, large neck, large tongue Lungs: Decreased to both bases, few scattered rhonchi but overall clear (improved) Cardiovascular: Regular, distant, no murmur Abdomen: Obese, nondistended with positive bowel sounds Extremities: No edema Neuro: Awake and alert.  A bit slow to respond but does respond appropriately, follows commands  Resolved problem list   Assessment and Plan   Acute respiratory failure due to in large part to difficulty with secretion management, dysphagia. - Agree with n.p.o. status pending SLP recommendations - Push pulmonary hygiene measures - Scheduled bronchodilators ordered - Continue to treat sinusitis, otitis - Treatment of her underlying MS and weakness  Please call if there is an interval change or if we can help in any way.   Labs   CBC: Recent Labs  Lab 05/03/24 1118 05/03/24 1203 05/04/24 0629 05/05/24 0550  WBC 17.4*  --  13.6* 13.6*  NEUTROABS 16.0*  --   --  12.1*  HGB 13.4 14.3 10.6* 9.7*  HCT 40.6 42.0 32.9*  29.4*  MCV 88.3  --  90.6 88.3  PLT 254  --  193 213    Basic Metabolic Panel: Recent Labs  Lab 05/03/24 1118 05/03/24 1203 05/04/24 0629 05/05/24 0550  NA 135 137 134* 137  K 3.8 3.7 3.7 3.5  CL 100 100 105 108  CO2 24  --  20* 20*  GLUCOSE 119* 115* 116* 96  BUN 11 11 16 12   CREATININE 0.98 1.00 1.18* 0.91  CALCIUM  9.4  --  7.9* 8.1*  MG  --   --  1.4* 2.5*  PHOS  --   --   --  2.9   GFR: Estimated Creatinine Clearance: 91.2 mL/min (by C-G formula based on SCr of 0.91 mg/dL). Recent Labs  Lab 05/03/24 1118 05/03/24 1204 05/03/24 1402 05/04/24 0629 05/04/24 1540 05/05/24 0550  PROCALCITON  --   --  1.97 2.24  --  1.57  WBC 17.4*  --   --  13.6*  --  13.6*  LATICACIDVEN  --  1.2  --  1.7 1.4  --     Liver Function Tests: Recent Labs  Lab 05/03/24 1118 05/05/24 0550  AST 43* 65*  ALT 40 92*  ALKPHOS 66 78  BILITOT 1.2 1.5*  PROT 7.7 5.9*  ALBUMIN 3.6 2.5*   ABG    Component Value Date/Time   TCO2 24 05/03/2024 1203     Coagulation Profile: Recent Labs  Lab 05/03/24 1118 05/05/24 0819  INR 1.1 1.3*    Lamar Chris, MD,  PhD 05/05/2024, 12:35 PM [facility] [phone] or if no answer before 7:00PM call [phone] For any issues after 7:00PM please call eLink [phone]

## 2024-05-06 ENCOUNTER — Inpatient Hospital Stay (HOSPITAL_COMMUNITY)

## 2024-05-06 DIAGNOSIS — A419 Sepsis, unspecified organism: Secondary | ICD-10-CM | POA: Diagnosis not present

## 2024-05-06 DIAGNOSIS — J329 Chronic sinusitis, unspecified: Secondary | ICD-10-CM | POA: Diagnosis not present

## 2024-05-06 DIAGNOSIS — J189 Pneumonia, unspecified organism: Secondary | ICD-10-CM | POA: Diagnosis not present

## 2024-05-06 DIAGNOSIS — R569 Unspecified convulsions: Secondary | ICD-10-CM

## 2024-05-06 DIAGNOSIS — J9811 Atelectasis: Secondary | ICD-10-CM | POA: Diagnosis not present

## 2024-05-06 DIAGNOSIS — J9601 Acute respiratory failure with hypoxia: Secondary | ICD-10-CM | POA: Diagnosis not present

## 2024-05-06 DIAGNOSIS — R1310 Dysphagia, unspecified: Secondary | ICD-10-CM | POA: Diagnosis not present

## 2024-05-06 DIAGNOSIS — G35 Multiple sclerosis: Secondary | ICD-10-CM | POA: Diagnosis not present

## 2024-05-06 LAB — MAGNESIUM: Magnesium: 2.1 mg/dL (ref 1.7–2.4)

## 2024-05-06 LAB — CBC WITH DIFFERENTIAL/PLATELET
Abs Immature Granulocytes: 0.13 K/uL — ABNORMAL HIGH (ref 0.00–0.07)
Basophils Absolute: 0 K/uL (ref 0.0–0.1)
Basophils Relative: 0 %
Eosinophils Absolute: 0.3 K/uL (ref 0.0–0.5)
Eosinophils Relative: 2 %
HCT: 28 % — ABNORMAL LOW (ref 36.0–46.0)
Hemoglobin: 9.3 g/dL — ABNORMAL LOW (ref 12.0–15.0)
Immature Granulocytes: 1 %
Lymphocytes Relative: 9 %
Lymphs Abs: 1 K/uL (ref 0.7–4.0)
MCH: 29.3 pg (ref 26.0–34.0)
MCHC: 33.2 g/dL (ref 30.0–36.0)
MCV: 88.3 fL (ref 80.0–100.0)
Monocytes Absolute: 0.7 K/uL (ref 0.1–1.0)
Monocytes Relative: 7 %
Neutro Abs: 8.4 K/uL — ABNORMAL HIGH (ref 1.7–7.7)
Neutrophils Relative %: 81 %
Platelets: 240 K/uL (ref 150–400)
RBC: 3.17 MIL/uL — ABNORMAL LOW (ref 3.87–5.11)
RDW: 13.7 % (ref 11.5–15.5)
WBC: 10.5 K/uL (ref 4.0–10.5)
nRBC: 0 % (ref 0.0–0.2)

## 2024-05-06 LAB — COMPREHENSIVE METABOLIC PANEL WITH GFR
ALT: 65 U/L — ABNORMAL HIGH (ref 0–44)
AST: 28 U/L (ref 15–41)
Albumin: 2.4 g/dL — ABNORMAL LOW (ref 3.5–5.0)
Alkaline Phosphatase: 80 U/L (ref 38–126)
Anion gap: 8 (ref 5–15)
BUN: 11 mg/dL (ref 6–20)
CO2: 21 mmol/L — ABNORMAL LOW (ref 22–32)
Calcium: 8.3 mg/dL — ABNORMAL LOW (ref 8.9–10.3)
Chloride: 108 mmol/L (ref 98–111)
Creatinine, Ser: 0.74 mg/dL (ref 0.44–1.00)
GFR, Estimated: >60 mL/min (ref >60)
Glucose, Bld: 109 mg/dL — ABNORMAL HIGH (ref 70–99)
Potassium: 3.3 mmol/L — ABNORMAL LOW (ref 3.5–5.1)
Sodium: 137 mmol/L (ref 135–145)
Total Bilirubin: 1 mg/dL (ref 0.0–1.2)
Total Protein: 5.9 g/dL — ABNORMAL LOW (ref 6.5–8.1)

## 2024-05-06 LAB — BLOOD GAS, VENOUS
Acid-base deficit: 1.2 mmol/L (ref 0.0–2.0)
Bicarbonate: 23.6 mmol/L (ref 20.0–28.0)
Drawn by: 737341
O2 Saturation: 88 %
Patient temperature: 37.2
pCO2, Ven: 39 mmHg — ABNORMAL LOW (ref 44–60)
pH, Ven: 7.39 (ref 7.25–7.43)
pO2, Ven: 53 mmHg — ABNORMAL HIGH (ref 32–45)

## 2024-05-06 LAB — PHOSPHORUS: Phosphorus: 2.8 mg/dL (ref 2.5–4.6)

## 2024-05-06 LAB — C-REACTIVE PROTEIN: CRP: 19.5 mg/dL — ABNORMAL HIGH (ref <1.0)

## 2024-05-06 LAB — PROCALCITONIN: Procalcitonin: 0.87 ng/mL

## 2024-05-06 LAB — AMMONIA: Ammonia: 40 umol/L — ABNORMAL HIGH (ref 9–35)

## 2024-05-06 MED ORDER — POTASSIUM CHLORIDE CRYS ER 20 MEQ PO TBCR
40.0000 meq | EXTENDED_RELEASE_TABLET | Freq: Once | ORAL | Status: AC
  Administered 2024-05-06: 40 meq via ORAL
  Filled 2024-05-06: qty 2

## 2024-05-06 MED ORDER — LACTATED RINGERS IV SOLN: INTRAVENOUS | Status: DC

## 2024-05-06 NOTE — Progress Notes (Signed)
 RT collected ABG however the sample wasn't enough for ISTAT. Neuro informed, VBG ordered.

## 2024-05-06 NOTE — Progress Notes (Signed)
 [facility]  Date of Admission:  05/03/2024   Total days of inpatient antibiotics 3  Principal Problem:   Sepsis due to pneumonia [facility]) Active Problems:   Hyperlipidemia   OBESITY, NOS   Schizoaffective disorder, depressive type (HCC)   Multiple sclerosis (HCC)   Frequent falls   Acute respiratory failure with hypoxia (HCC)   Esophageal dysphagia   Odynophagia   Acute pansinusitis   Otitis externa   Pneumonia due to infectious organism   Acute respiratory distress          Assessment: 43 year old female admitted with #Otitis media externa with significant sinusitis #Multiple sclerosis started on kesimpta #Dysphagia mother secondary to #1 and immunocompromise state secondary to thrush versus HSV #Possible pneumonia #Fever and leukocytosis -resolved  - CT chest initially has shown right hilar lower lobe collapse/consolidation could be related to pneumonia. - CT maxillofacial showed para sinusitis and multiple focal obstruction sinus drainage pathways, partial opacification left middle ear cavity-poor dentition with multifocal dental.  Dental disease.  Wire CTL showed increased T2 and spinal cord C2 and C4 compatible demyelinating plaques - Patient was started on acyclovir , vancomycin  was stopped fluconazole  continued. -Fever curve is trending down - Given new finding on MRI, concern for worsening MS symptoms.  Discussed with neurology in regards to timing of starting steroids for MS symptoms.  If patient remains afebrile for the next 72 hours(pending negative blood Cx and cloinical progression) I think steroids are okay to discharge from the ID perspective as sinusitis/PNA generally treated for about 5-7 days(given immunosuppression would likely opt for 10 days).  Neurology plans on assessing patient tomorrow to see if range of motion is improved, need for steroids.  Patient has clinically improved significantly.  She states today that she feels  100% better. Recommendations: -Continue pip-tazo, fluconazole  acyclovir . -Am she was noted to be lethargic, dificult to interact. Ccm was engaged and noted she was awake, eating. During my visit pt was sitting in chair eating. Through is still a bit sore but she is able to eat. Mental status seems to waxing and waning. She reains afebrile on abx, low concern for meningitis at this point -OT eval today -EEG report normal today  Evaluation of this patient requires complex antimicrobial therapy evaluation and counseling + isolation needs for disease transmission risk assessment and mitigation     Microbiology:   Antibiotics: Acyclovir  Fluconazole  7/11 present Pip-tazo 7/11-present 710 vancomycin  Cultures: Blood 7/10 no growth Urine  Other   SUBJECTIVE: Sitting in chair, eating.  She reports that she feels well Interval: Afebrile overnight, wbc 10k  Review of Systems: Review of Systems  All other systems reviewed and are negative.    Scheduled Meds:  atorvastatin   20 mg Oral QHS   ciprofloxacin -dexamethasone   4 drop Left EAR BID   enoxaparin  (LOVENOX ) injection  40 mg Subcutaneous Daily   gabapentin   300 mg Oral TID   guaiFENesin   600 mg Oral BID   ipratropium-albuterol   3 mL Nebulization Q6H   magic mouthwash w/lidocaine   15 mL Oral TID AC & HS   midodrine   10 mg Oral TID WC   multivitamin with minerals  1 tablet Oral Daily   oxymetazoline   1 spray Each Nare BID   pantoprazole  (PROTONIX ) IV  40 mg Intravenous Q12H   risperiDONE   3 mg Oral QHS   sertraline   150 mg Oral Daily   Continuous Infusions:  acyclovir  365 mg (05/06/24 0857)  fluconazole  (DIFLUCAN ) IV 400 mg (05/06/24 0908)   lactated ringers  50 mL/hr at 05/06/24 9147   piperacillin -tazobactam (ZOSYN )  IV Stopped (05/06/24 0912)   PRN Meds:.albuterol , hydrOXYzine , sodium chloride  Allergies  Allergen Reactions   Norco [Hydrocodone -Acetaminophen ] Other (See Comments)    Confusion   Pork (Diagnostic)  Other (See Comments)    Per pt, contraindicated due to MS   Keflex [Cephalexin] Rash    OBJECTIVE: Vitals:   05/06/24 0400 05/06/24 0413 05/06/24 0747 05/06/24 1200  BP: (!) 140/64 126/65 (!) 119/52 94/60  Pulse: (!) 110 (!) 109 (!) 111 96  Resp: (!) 28 (!) 23 (!) 24 (!) 22  Temp:  98.8 F (37.1 C) 98.7 F (37.1 C) 98.8 F (37.1 C)  TempSrc:  Oral Oral Oral  SpO2: 94% 95% 96% 99%  Weight:      Height:       Body mass index is 38.22 kg/m.  Physical Exam Constitutional:      Appearance: Normal appearance.  HENT:     Head: Normocephalic and atraumatic.     Right Ear: Tympanic membrane normal.     Left Ear: Tympanic membrane normal.     Nose: Nose normal.     Mouth/Throat:     Mouth: Mucous membranes are moist.  Eyes:     Extraocular Movements: Extraocular movements intact.     Conjunctiva/sclera: Conjunctivae normal.     Pupils: Pupils are equal, round, and reactive to light.  Cardiovascular:     Rate and Rhythm: Normal rate and regular rhythm.     Heart sounds: No murmur heard.    No friction rub. No gallop.  Pulmonary:     Effort: Pulmonary effort is normal.     Breath sounds: Normal breath sounds.  Abdominal:     General: Abdomen is flat.     Palpations: Abdomen is soft.  Skin:    General: Skin is warm and dry.  Neurological:     General: No focal deficit present.     Mental Status: She is alert and oriented to person, place, and time.  Psychiatric:        Mood and Affect: Mood normal.       Lab Results Lab Results  Component Value Date   WBC 10.5 05/06/2024   HGB 9.3 (L) 05/06/2024   HCT 28.0 (L) 05/06/2024   MCV 88.3 05/06/2024   PLT 240 05/06/2024    Lab Results  Component Value Date   CREATININE 0.74 05/06/2024   BUN 11 05/06/2024   NA 137 05/06/2024   K 3.3 (L) 05/06/2024   CL 108 05/06/2024   CO2 21 (L) 05/06/2024    Lab Results  Component Value Date   ALT 65 (H) 05/06/2024   AST 28 05/06/2024   ALKPHOS 80 05/06/2024   BILITOT  1.0 05/06/2024        Loney Stank, MD [facility] 05/06/2024, 4:03 PM

## 2024-05-06 NOTE — Progress Notes (Signed)
 Pt achieved a NIF of -23 with great pt effort on all attempts. Neuro aware.

## 2024-05-06 NOTE — Progress Notes (Signed)
 EEG complete - results pending

## 2024-05-06 NOTE — Progress Notes (Signed)
 Pt achieved NIF -25, attempt x3. Good pt effort with each attempt.

## 2024-05-06 NOTE — Evaluation (Signed)
 Occupational Therapy Evaluation Patient Details Name: Allison Moore MRN: [id number] DOB: 1981/03/31 Today's Date: 05/06/2024   History of Present Illness   Pt is 43 year old presented to [facility] on 05/03/24 for weakness and falls. Pt with sepsis with lt otitis externa, acute sinusitis, possible aspiration PNA and possible esophagitis. PMH - MS,anxiety, depression, obesity, tibial plateau fx     Clinical Impressions Pt admitted based on above, and was seen based on problem list below. PTA pt was independent with ADLs, reports managing her own medications while living at a boarding house. Today pt is requiring set up  to mod assist for ADLs. Functional transfers are  mod assist. Noted decreased coordination and UB strength, cog deficits likely baseline. Recommendation of <3 hours of skilled rehab daily. OT will continue to follow acutely to maximize functional independence.      If plan is discharge home, recommend the following:   A lot of help with walking and/or transfers;A lot of help with bathing/dressing/bathroom;Assistance with cooking/housework;Direct supervision/assist for medications management     Functional Status Assessment   Patient has had a recent decline in their functional status and demonstrates the ability to make significant improvements in function in a reasonable and predictable amount of time.     Equipment Recommendations   Other (comment) (Defer to next venue)      Precautions/Restrictions   Precautions Precautions: Fall Recall of Precautions/Restrictions: Impaired Restrictions Weight Bearing Restrictions Per Provider Order: No     Mobility Bed Mobility     General bed mobility comments: Received in recliner    Transfers Overall transfer level: Needs assistance Equipment used: Rolling walker (2 wheels) Transfers: Sit to/from Stand, Bed to chair/wheelchair/BSC Sit to Stand: Mod assist     Step pivot transfers: Contact guard assist      General transfer comment: Inital STS max assist, x2 additional trials progressed to light mod assist. short distance in room <10ft each time      Balance Overall balance assessment: Needs assistance Sitting-balance support: No upper extremity supported, Feet supported Sitting balance-Leahy Scale: Fair     Standing balance support: Bilateral upper extremity supported Standing balance-Leahy Scale: Poor Standing balance comment: Reliant on RW       ADL either performed or assessed with clinical judgement   ADL Overall ADL's : Needs assistance/impaired Eating/Feeding: Set up;Sitting   Grooming: Set up;Sitting   Upper Body Bathing: Set up;Sitting   Lower Body Bathing: Moderate assistance;Sit to/from stand   Upper Body Dressing : Set up;Sitting   Lower Body Dressing: Moderate assistance;Sit to/from stand Lower Body Dressing Details (indicate cue type and reason): Assist to don LLE sock, and pull above waist in standing Toilet Transfer: Moderate assistance;Ambulation;Rolling walker (2 wheels) Toilet Transfer Details (indicate cue type and reason): Short distances simulated in room, mostly mod assist to stand Toileting- Clothing Manipulation and Hygiene: Moderate assistance       Functional mobility during ADLs: Moderate assistance;Rolling walker (2 wheels) General ADL Comments: Decreased strength and activity tolerance     Vision Baseline Vision/History: 1 Wears glasses Patient Visual Report: No change from baseline Vision Assessment?: No apparent visual deficits     Perception Perception: Not tested       Praxis Praxis: Not tested       Pertinent Vitals/Pain Pain Assessment Pain Assessment: No/denies pain     Extremity/Trunk Assessment Upper Extremity Assessment Upper Extremity Assessment: Generalized weakness (Decreased bil grip strength and FMC)   Lower Extremity Assessment Lower Extremity Assessment: Defer to PT  evaluation   Cervical / Trunk  Assessment Cervical / Trunk Assessment: Normal   Communication Communication Communication: Impaired Factors Affecting Communication: Difficulty expressing self;Reduced clarity of speech   Cognition Arousal: Alert Behavior During Therapy: Flat affect Cognition: Cognition impaired     Awareness: Intellectual awareness impaired, Online awareness impaired   Attention impairment (select first level of impairment): Focused attention Executive functioning impairment (select all impairments): Initiation, Sequencing, Problem solving, Organization OT - Cognition Comments: Delayed processing speed, only able to follow simple commands, not good comprehension of verbal instructions, potential baselinr                 Following commands: Impaired Following commands impaired: Only follows one step commands consistently, Follows one step commands with increased time     Cueing  General Comments   Cueing Techniques: Verbal cues;Tactile cues  VSS on 2L O2           Home Living Family/patient expects to be discharged to:: Private residence Living Arrangements: Non-relatives/Friends Available Help at Discharge: Available PRN/intermittently Type of Home: House Home Access: Stairs to enter Secretary/administrator of Steps: 5 Entrance Stairs-Rails: Right;Left;Can reach both Home Layout: Able to live on main level with bedroom/bathroom     Bathroom Shower/Tub: Chief Strategy Officer: Standard     Home Equipment: Agricultural consultant (2 wheels)   Additional Comments: Per mom recently using boarding house last 3 months      Prior Functioning/Environment Prior Level of Function : History of Falls (last six months)     Mobility Comments: Pt reports she uses rolling walker modified independent. Multiple falls ADLs Comments: Pt reports managing her own medicarions    OT Problem List: Decreased strength;Decreased range of motion;Decreased activity tolerance;Impaired balance  (sitting and/or standing);Decreased safety awareness;Decreased knowledge of use of DME or AE   OT Treatment/Interventions: Therapeutic exercise;Self-care/ADL training;Neuromuscular education;Energy conservation;DME and/or AE instruction;Therapeutic activities;Visual/perceptual remediation/compensation;Patient/family education;Balance training      OT Goals(Current goals can be found in the care plan section)   Acute Rehab OT Goals Patient Stated Goal: To get stronger OT Goal Formulation: With patient Time For Goal Achievement: 05/20/24 Potential to Achieve Goals: Good   OT Frequency:  Min 2X/week       AM-PAC OT 6 Clicks Daily Activity     Outcome Measure Help from another person eating meals?: None Help from another person taking care of personal grooming?: A Little Help from another person toileting, which includes using toliet, bedpan, or urinal?: A Lot Help from another person bathing (including washing, rinsing, drying)?: A Lot Help from another person to put on and taking off regular upper body clothing?: A Little Help from another person to put on and taking off regular lower body clothing?: A Little 6 Click Score: 17   End of Session Equipment Utilized During Treatment: Gait belt;Rolling walker (2 wheels) Nurse Communication: Mobility status  Activity Tolerance: Patient tolerated treatment well Patient left: in chair;with call bell/phone within reach;with chair alarm set;with family/visitor present  OT Visit Diagnosis: Other abnormalities of gait and mobility (R26.89);Unsteadiness on feet (R26.81);Muscle weakness (generalized) (M62.81)                Time: 8660-8593 OT Time Calculation (min): 27 min Charges:  OT General Charges $OT Visit: 1 Visit OT Evaluation $OT Eval Moderate Complexity: 1 Mod  Zanai Mallari C, OT  Acute Rehabilitation Services Office [phone] Secure chat preferred   Adrianne GORMAN Savers 05/06/2024, 3:22 PM

## 2024-05-06 NOTE — Plan of Care (Signed)
  Problem: Education: Goal: Knowledge of General Education information will improve Description: Including pain rating scale, medication(s)/side effects and non-pharmacologic comfort measures Outcome: Progressing   Problem: Health Behavior/Discharge Planning: Goal: Ability to manage health-related needs will improve Outcome: Progressing   Problem: Clinical Measurements: Goal: Ability to maintain clinical measurements within normal limits will improve Outcome: Progressing Goal: Will remain free from infection Outcome: Progressing Goal: Cardiovascular complication will be avoided Outcome: Progressing   Problem: Activity: Goal: Risk for activity intolerance will decrease Outcome: Progressing   Problem: Nutrition: Goal: Adequate nutrition will be maintained Outcome: Progressing   Problem: Coping: Goal: Level of anxiety will decrease Outcome: Progressing   Problem: Elimination: Goal: Will not experience complications related to bowel motility Outcome: Progressing Goal: Will not experience complications related to urinary retention Outcome: Progressing   Problem: Pain Managment: Goal: General experience of comfort will improve and/or be controlled Outcome: Progressing   Problem: Safety: Goal: Ability to remain free from injury will improve Outcome: Progressing   Problem: Skin Integrity: Goal: Risk for impaired skin integrity will decrease Outcome: Progressing   Problem: Activity: Goal: Ability to tolerate increased activity will improve Outcome: Progressing   Problem: Clinical Measurements: Goal: Ability to maintain a body temperature in the normal range will improve Outcome: Progressing   Problem: Respiratory: Goal: Ability to maintain adequate ventilation will improve Outcome: Progressing Goal: Ability to maintain a clear airway will improve Outcome: Progressing

## 2024-05-06 NOTE — Progress Notes (Signed)
 PROGRESS NOTE        PATIENT DETAILS Name: Allison Moore Age: 43 y.o. Sex: female Date of Birth: 03/01/1981 Admit Date: 05/03/2024 Admitting Physician Maximino DELENA Sharps, MD ERE:[email or web address], Rosina [name], PA  Brief Summary: Patient is a 43 y.o.  female with history of MS (walker at baseline)-who was on Kesimpta injections in May 2025 (?3 doses so far)-who presented to the ED for 4 to 5-day history of subjective fever, nasal congestion, shortness of breath, left ear watery discharge, odynophagia and frequent falls.  She was found to have pneumonia-with sepsis physiology and subsequently admitted to the hospitalist service.  Significant events: 7/10>> admit to [facility]-sepsis-frequent falls-PNA-URI symptoms-left ear discharge.  Significant studies: 7/10>> CT head: No acute intracranial abnormality 7/11>> CXR: Left basilar opacity.  Significant microbiology data: 7/10>> COVID/influenza/RSV PCR: Negative 7/10>> blood cultures: Pending 7/11>> respiratory virus panel: Negative  Procedures: None  Consults: ENT ID GI PCCM  Subjective: Patient in bed somewhat somnolent, denies any headache or chest pain, no shortness of breath.  Objective: Vitals: Blood pressure (!) 119/52, pulse (!) 111, temperature 98.7 F (37.1 C), temperature source Oral, resp. rate (!) 24, height 5' 4 (1.626 m), weight 101 kg, SpO2 96%.   Exam:  Awake but mentation waxing and waning, No new F.N deficits, Normal affect [place].AT,PERRAL Supple Neck, No JVD,   Symmetrical Chest wall movement, few bibasilar crackles RRR,No Gallops, Rubs or new Murmurs,  +ve B.Sounds, Abd Soft, No tenderness,   No Cyanosis, Clubbing or edema    Assessment/Plan:  Sepsis-with left otitis externa, acute on chronic sinusitis, possible aspiration pneumonia, possible esophagitis in a patient who is immunocompromised due to being on ofatumumab injections for multiple sclerosis  He has been seen by ENT Dr. Roark,  PCCM Dr. Shelah, GI Dr. Marinda and ID Dr. Lindia, case discussed with ENT and PCCM, currently on empiric IV antibiotics, IV Diflucan  and IV acyclovir , Cipro  eardrops to the left ear ordered after discussions with ENT, clinically she has shown improvement on 05/05/2024.  Sepsis pathophysiology is resolving, speech therapy also following closely as aspiration of oral secretions is high on the list.  Speech following currently on a dysphagia 2 diet, GI also contemplating possible EGD later this admission.  Ongoing dysphagia and some odynophagia.  Currently n.p.o., speech GI on board.  On fluconazole  for high risk of Candida esophagitis due to being immunocompromised, defer further GI workup to gastroenterology team  History of MS with multiple falls Discussed with neurology-no headache, no meningismus, no photophobia, MRI brain nonacute, MRI C and T-spine ordered with potential new lesions in the C-spine, neurology is following.  Waxing and waning mental status.  Metabolic encephalopathy, obtaining ABG and EEG, neuro informed, PCCM also on board.  Continue to monitor   Hypomagnesemia.  Replaced.    HLD Statin  Schizoaffective disorder Anxiety/depression Anxiety slightly worse due to acute illness Continue Zoloft /risperidone   Class 2 Obesity: Estimated body mass index is 38.22 kg/m as calculated from the following:   Height as of this encounter: 5' 4 (1.626 m).   Weight as of this encounter: 101 kg.   Code status:   Code Status: Full Code   DVT Prophylaxis: enoxaparin  (LOVENOX ) injection 40 mg Start: 05/06/24 1000 SCDs Start: 05/03/24 1357    Family Communication: Father-Lester [phone] updated site 05/05/2024   Disposition Plan: Status is: Inpatient Remains inpatient appropriate because: Severity of  illness   Planned Discharge Destination:Home health versus SNF   Diet: Diet Order             DIET DYS 2 Fluid consistency: Thin  Diet effective now                      Data Review:   Inpatient Medications  Scheduled Meds:  atorvastatin   20 mg Oral QHS   ciprofloxacin -dexamethasone   4 drop Left EAR BID   enoxaparin  (LOVENOX ) injection  40 mg Subcutaneous Daily   gabapentin   300 mg Oral TID   guaiFENesin   600 mg Oral BID   ipratropium-albuterol   3 mL Nebulization Q6H   magic mouthwash w/lidocaine   15 mL Oral TID AC & HS   midodrine   10 mg Oral TID WC   multivitamin with minerals  1 tablet Oral Daily   oxymetazoline   1 spray Each Nare BID   pantoprazole  (PROTONIX ) IV  40 mg Intravenous Q12H   risperiDONE   3 mg Oral QHS   sertraline   150 mg Oral Daily   Continuous Infusions:  acyclovir  365 mg (05/06/24 0857)   fluconazole  (DIFLUCAN ) IV 400 mg (05/06/24 0908)   lactated ringers  50 mL/hr at 05/06/24 0852   piperacillin -tazobactam (ZOSYN )  IV 3.375 g (05/06/24 0509)   PRN Meds:.albuterol , hydrOXYzine , sodium chloride   DVT Prophylaxis  enoxaparin  (LOVENOX ) injection 40 mg Start: 05/06/24 1000 SCDs Start: 05/03/24 1357   Recent Labs  Lab 05/03/24 1118 05/03/24 1203 05/04/24 0629 05/05/24 0550 05/06/24 0802  WBC 17.4*  --  13.6* 13.6* 10.5  HGB 13.4 14.3 10.6* 9.7* 9.3*  HCT 40.6 42.0 32.9* 29.4* 28.0*  PLT 254  --  193 213 240  MCV 88.3  --  90.6 88.3 88.3  MCH 29.1  --  29.2 29.1 29.3  MCHC 33.0  --  32.2 33.0 33.2  RDW 13.1  --  13.3 13.8 13.7  LYMPHSABS 0.3*  --   --  0.6* 1.0  MONOABS 0.8  --   --  0.5 0.7  EOSABS 0.0  --   --  0.2 0.3  BASOSABS 0.0  --   --  0.0 0.0    Recent Labs  Lab 05/03/24 1118 05/03/24 1203 05/03/24 1204 05/03/24 1402 05/04/24 0629 05/04/24 1540 05/05/24 0550 05/05/24 0819  NA 135 137  --   --  134*  --  137  --   K 3.8 3.7  --   --  3.7  --  3.5  --   CL 100 100  --   --  105  --  108  --   CO2 24  --   --   --  20*  --  20*  --   ANIONGAP 11  --   --   --  9  --  9  --   GLUCOSE 119* 115*  --   --  116*  --  96  --   BUN 11 11  --   --  16  --  12  --   CREATININE 0.98 1.00  --    --  1.18*  --  0.91  --   AST 43*  --   --   --   --   --  65*  --   ALT 40  --   --   --   --   --  92*  --   ALKPHOS 66  --   --   --   --   --  78  --   BILITOT 1.2  --   --   --   --   --  1.5*  --   ALBUMIN 3.6  --   --   --   --   --  2.5*  --   CRP  --   --   --   --   --   --  21.5*  --   PROCALCITON  --   --   --  1.97 2.24  --  1.57  --   LATICACIDVEN  --   --  1.2  --  1.7 1.4  --   --   INR 1.1  --   --   --   --   --   --  1.3*  TSH  --   --   --   --  2.461  --   --   --   BNP  --   --   --   --  99.1  --   --   --   MG  --   --   --   --  1.4*  --  2.5*  --   PHOS  --   --   --   --   --   --  2.9  --   CALCIUM  9.4  --   --   --  7.9*  --  8.1*  --       Recent Labs  Lab 05/03/24 1118 05/03/24 1204 05/03/24 1402 05/04/24 0629 05/04/24 1540 05/05/24 0550 05/05/24 0819  CRP  --   --   --   --   --  21.5*  --   PROCALCITON  --   --  1.97 2.24  --  1.57  --   LATICACIDVEN  --  1.2  --  1.7 1.4  --   --   INR 1.1  --   --   --   --   --  1.3*  TSH  --   --   --  2.461  --   --   --   BNP  --   --   --  99.1  --   --   --   MG  --   --   --  1.4*  --  2.5*  --   CALCIUM  9.4  --   --  7.9*  --  8.1*  --     --------------------------------------------------------------------------------------------------------------- Lab Results  Component Value Date   CHOL 176 11/15/2023   HDL 50 11/15/2023   LDLCALC 114 (H) 11/15/2023   TRIG 62 11/15/2023   CHOLHDL 3.5 11/15/2023    Lab Results  Component Value Date   HGBA1C 5.1 11/15/2023   Recent Labs    05/04/24 0629  TSH 2.461   No results for input(s): VITAMINB12, FOLATE, FERRITIN, TIBC, IRON, RETICCTPCT in the last 72 hours. ------------------------------------------------------------------------------------------------------------------ Cardiac Enzymes No results for input(s): CKMB, TROPONINI, MYOGLOBIN in the last 168 hours.  Invalid input(s): CK  Micro Results Recent Results  (from the past 240 hours)  Blood Culture (routine x 2)     Status: None (Preliminary result)   Collection Time: 05/03/24 11:23 AM   Specimen: BLOOD  Result Value Ref Range Status   Specimen Description BLOOD SITE NOT SPECIFIED  Final   Special Requests   Final    BOTTLES DRAWN AEROBIC AND ANAEROBIC Blood Culture adequate volume   Culture   Final  NO GROWTH 3 DAYS Performed at [facility], [address] [place], [place] [address]    Report Status PENDING  Incomplete  Blood Culture (routine x 2)     Status: None (Preliminary result)   Collection Time: 05/03/24 11:43 AM   Specimen: BLOOD LEFT ARM  Result Value Ref Range Status   Specimen Description BLOOD LEFT ARM  Final   Special Requests   Final    BOTTLES DRAWN AEROBIC AND ANAEROBIC Blood Culture adequate volume   Culture   Final    NO GROWTH 3 DAYS Performed at [facility], [address], [place], [place] [address]    Report Status PENDING  Incomplete  Resp panel by RT-PCR (RSV, Flu A&B, Covid) Anterior Nasal Swab     Status: None   Collection Time: 05/03/24  1:11 PM   Specimen: Anterior Nasal Swab  Result Value Ref Range Status   SARS Coronavirus 2 by RT PCR NEGATIVE NEGATIVE Final   Influenza A by PCR NEGATIVE NEGATIVE Final   Influenza B by PCR NEGATIVE NEGATIVE Final    Comment: (NOTE) The Xpert Xpress SARS-CoV-2/FLU/RSV plus assay is intended as an aid in the diagnosis of influenza from Nasopharyngeal swab specimens and should not be used as a sole basis for treatment. Nasal washings and aspirates are unacceptable for Xpert Xpress SARS-CoV-2/FLU/RSV testing.  Fact Sheet for Patients: BloggerCourse.com  Fact Sheet for Healthcare Providers: SeriousBroker.it  This test is not yet approved or cleared by the [place]  FDA and has been authorized for detection and/or diagnosis of SARS-CoV-2 by FDA under an Emergency Use Authorization (EUA). This EUA  will remain in effect (meaning this test can be used) for the duration of the COVID-19 declaration under Section 564(b)(1) of the Act, 21 U.S.C. section 360bbb-3(b)(1), unless the authorization is terminated or revoked.     Resp Syncytial Virus by PCR NEGATIVE NEGATIVE Final    Comment: (NOTE) Fact Sheet for Patients: BloggerCourse.com  Fact Sheet for Healthcare Providers: SeriousBroker.it  This test is not yet approved or cleared by the [place]  FDA and has been authorized for detection and/or diagnosis of SARS-CoV-2 by FDA under an Emergency Use Authorization (EUA). This EUA will remain in effect (meaning this test can be used) for the duration of the COVID-19 declaration under Section 564(b)(1) of the Act, 21 U.S.C. section 360bbb-3(b)(1), unless the authorization is terminated or revoked.  Performed at [facility], [address], [place], [place] [address]   MRSA Next Gen by PCR, Nasal     Status: None   Collection Time: 05/03/24 10:20 PM   Specimen: Nasal Mucosa; Nasal Swab  Result Value Ref Range Status   MRSA by PCR Next Gen NOT DETECTED NOT DETECTED Final    Comment: (NOTE) The GeneXpert MRSA Assay (FDA approved for NASAL specimens only), is one component of a comprehensive MRSA colonization surveillance program. It is not intended to diagnose MRSA infection nor to guide or monitor treatment for MRSA infections. Test performance is not FDA approved in patients less than 63 years old. Performed at [facility], [address] [place], [place] [address]   Respiratory (~20 pathogens) panel by PCR     Status: None   Collection Time: 05/04/24  6:20 AM   Specimen: Nasopharyngeal Swab; Respiratory  Result Value Ref Range Status   Adenovirus NOT DETECTED NOT DETECTED Final   Coronavirus 229E NOT DETECTED NOT DETECTED Final    Comment: (NOTE) The Coronavirus on  the Respiratory Panel, DOES NOT test for  the novel  Coronavirus (2019 nCoV)    Coronavirus HKU1 NOT DETECTED NOT DETECTED Final   Coronavirus NL63 NOT DETECTED NOT DETECTED Final   Coronavirus OC43 NOT DETECTED NOT DETECTED Final   Metapneumovirus NOT DETECTED NOT DETECTED Final   Rhinovirus / Enterovirus NOT DETECTED NOT DETECTED Final   Influenza A NOT DETECTED NOT DETECTED Final   Influenza B NOT DETECTED NOT DETECTED Final   Parainfluenza Virus 1 NOT DETECTED NOT DETECTED Final   Parainfluenza Virus 2 NOT DETECTED NOT DETECTED Final   Parainfluenza Virus 3 NOT DETECTED NOT DETECTED Final   Parainfluenza Virus 4 NOT DETECTED NOT DETECTED Final   Respiratory Syncytial Virus NOT DETECTED NOT DETECTED Final   Bordetella pertussis NOT DETECTED NOT DETECTED Final   Bordetella Parapertussis NOT DETECTED NOT DETECTED Final   Chlamydophila pneumoniae NOT DETECTED NOT DETECTED Final   Mycoplasma pneumoniae NOT DETECTED NOT DETECTED Final    Comment: Performed at [facility], [address] [place], [place] [address]    Radiology Reports  MR THORACIC SPINE W WO CONTRAST Result Date: 05/05/2024 CLINICAL DATA:  Myelopathy, chronic, thoracic spine History of MS-frequent falls-sepsis-rule out flare. EXAM: MRI THORACIC WITHOUT AND WITH CONTRAST TECHNIQUE: Multiplanar and multiecho pulse sequences of the thoracic spine were obtained without and with intravenous contrast. CONTRAST:  10mL GADAVIST  GADOBUTROL  1 MMOL/ML IV SOLN COMPARISON:  MRI of the thoracic spine dated November 17, 2023. FINDINGS: Alignment:  Normal. Vertebrae: Normal. Cord: Normal in morphology and signal intensity. No abnormal enhancement of the spinal cord or the nerve roots. Paraspinal and other soft tissues: Negative. Disc levels: The disc spaces are well preserved throughout the thoracic spine. The spinal canal and neural foramina are widely patent. IMPRESSION: Normal. Electronically Signed   By: Evalene Coho M.D.   On: 05/05/2024 13:36   MR CERVICAL SPINE W  WO CONTRAST Result Date: 05/05/2024 CLINICAL DATA:  Myelopathy, chronic, cervical spine History of MS-frequent falls-sepsis-rule out flare. EXAM: MRI CERVICAL SPINE WITHOUT AND WITH CONTRAST TECHNIQUE: Multiplanar and multiecho pulse sequences of the cervical spine, to include the craniocervical junction and cervicothoracic junction, were obtained without and with intravenous contrast. CONTRAST:  10mL GADAVIST  GADOBUTROL  1 MMOL/ML IV SOLN COMPARISON:  MRI of the cervical spine dated November 18, 2023. FINDINGS: Alignment: Straightening of the normal cervical lordosis. Vertebrae: Intact and heterogeneous in signal intensity. No osseous lesions present. Cord: New focal area of increased T2 signal within the spinal cord at the C2 vertebral body level on the T2 and STIR sequences. There also appears to be a new focal area of increased T2 signal in the right hemi cord at C4, which is evident on image 33 of series 9 and image 9 of series 2. There is no appreciable a contrast enhancement of either lesion. The spinal cord is otherwise normal in signal intensity. Posterior Fossa, vertebral arteries, paraspinal tissues: Negative. Disc levels: There is chronic degenerative disc disease at C5-6 and C6-7, with mild posterior disc bulging and endplate ridging causing mild central spinal canal stenosis at both levels. There is no spinal cord or nerve root impingement. The neural foramina are patent. IMPRESSION: 1. New subtle areas of increased T2 signal within the spinal cord at C2 and C4, compatible with demyelinating plaques. Electronically Signed   By: Evalene Coho M.D.   On: 05/05/2024 13:32   DG Chest Port 1 View Result Date: 05/05/2024 CLINICAL DATA:  Shortness of breath. EXAM: PORTABLE CHEST 1  VIEW COMPARISON:  05/04/2024 FINDINGS: Low lung volumes. Cardiopericardial silhouette is at upper limits of normal for size. Vascular congestion noted with possible interstitial edema. Trace pleural effusions suspected. No  acute bony abnormality. Telemetry leads overlie the chest. IMPRESSION: Low volume film with vascular congestion and possible interstitial edema. Electronically Signed   By: Camellia Candle M.D.   On: 05/05/2024 08:27   MR BRAIN WO CONTRAST Result Date: 05/04/2024 CLINICAL DATA:  Headache, fever, frequent falls. Concern for sepsis. History of MS. EXAM: MRI HEAD WITHOUT CONTRAST TECHNIQUE: Multiplanar, multiecho pulse sequences of the brain and surrounding structures were obtained without intravenous contrast. COMPARISON:  MRI head 11/19/2023. FINDINGS: Brain: Limited sequences obtained, patient unable to finish examination. No restricted diffusion. Redemonstrated T2/FLAIR hyperintensity in the periventricular and deep white matter similar to the prior study. Few scattered foci of signal abnormality in the cerebellum again noted. No edema, mass effect, or midline shift. Similar appearance of generalized parenchymal volume loss. The basilar cisterns are patent. No extra-axial fluid collections. Ventricles: Prominence of the lateral ventricles suggestive of underlying parenchymal volume loss. Vascular: Skull base flow voids are visualized. Skull and upper cervical spine: No focal abnormality. Sinuses/Orbits: Orbits are symmetric. Mucosal thickening throughout the paranasal sinuses most pronounced in the ethmoid and maxillary sinuses. Other: Left mastoid effusion is new from prior. IMPRESSION: Examination limited due to motion artifact and limited sequences obtained. No acute infarct. Similar white matter signal abnormality suggestive of demyelinating process. Unable to assess for active demyelination given lack of IV contrast. New left mastoid effusion. Paranasal sinus disease is increased from prior. Electronically Signed   By: Donnice Mania M.D.   On: 05/04/2024 14:29   ECHOCARDIOGRAM COMPLETE Result Date: 05/04/2024    ECHOCARDIOGRAM REPORT   Patient Name:   JESSIECA RHEM Date of Exam: 05/04/2024 Medical Rec  #:  [id number]          Height:       64.0 in Accession #:    [id number]         Weight:       222.7 lb Date of Birth:  Aug 12, 1981           BSA:          2.048 m Patient Age:    43 years           BP:           93/49 mmHg Patient Gender: F                  HR:           123 bpm. Exam Location:  Inpatient Procedure: 2D Echo, Cardiac Doppler and Color Doppler (Both Spectral and Color            Flow Doppler were utilized during procedure). Indications:    Acutre respiratory distress R06.03  History:        Patient has no prior history of Echocardiogram examinations.                 Risk Factors:Hyperlipidimia.  Sonographer:    Benard Stallion Referring Phys: [id number] DONALDA M GHIMIRE IMPRESSIONS  1. Left ventricular ejection fraction, by estimation, is 60 to 65%. The left ventricle has normal function. The left ventricle has no regional wall motion abnormalities. Left ventricular diastolic parameters were normal.  2. Right ventricular systolic function is normal. The right ventricular size is normal.  3. The mitral valve is normal in structure. Trivial mitral valve regurgitation.  4. The  aortic valve is tricuspid. Aortic valve regurgitation is not visualized. No aortic stenosis is present. FINDINGS  Left Ventricle: Left ventricular ejection fraction, by estimation, is 60 to 65%. The left ventricle has normal function. The left ventricle has no regional wall motion abnormalities. The left ventricular internal cavity size was normal in size. There is  no left ventricular hypertrophy. Left ventricular diastolic parameters were normal. Right Ventricle: The right ventricular size is normal. No increase in right ventricular wall thickness. Right ventricular systolic function is normal. Left Atrium: Left atrial size was normal in size. Right Atrium: Right atrial size was normal in size. Pericardium: There is no evidence of pericardial effusion. Mitral Valve: The mitral valve is normal in structure. Trivial mitral valve  regurgitation. Tricuspid Valve: The tricuspid valve is normal in structure. Tricuspid valve regurgitation is trivial. Aortic Valve: The aortic valve is tricuspid. Aortic valve regurgitation is not visualized. No aortic stenosis is present. Aortic valve mean gradient measures 6.0 mmHg. Aortic valve peak gradient measures 11.6 mmHg. Aortic valve area, by VTI measures 3.10  cm. Pulmonic Valve: The pulmonic valve was grossly normal. Pulmonic valve regurgitation is trivial. Aorta: The aortic root and ascending aorta are structurally normal, with no evidence of dilitation. IAS/Shunts: The interatrial septum was not well visualized.  LEFT VENTRICLE PLAX 2D LVIDd:         4.40 cm   Diastology LVIDs:         3.00 cm   LV e' medial:    10.20 cm/s LV PW:         0.90 cm   LV E/e' medial:  9.2 LV IVS:        0.90 cm   LV e' lateral:   14.40 cm/s LVOT diam:     2.20 cm   LV E/e' lateral: 6.5 LV SV:         71 LV SV Index:   35 LVOT Area:     3.80 cm  RIGHT VENTRICLE RV S prime:     24.40 cm/s TAPSE (M-mode): 2.2 cm LEFT ATRIUM             Index        RIGHT ATRIUM           Index LA diam:        3.60 cm 1.76 cm/m   RA Area:     10.60 cm LA Vol (A2C):   38.4 ml 18.75 ml/m  RA Volume:   18.60 ml  9.08 ml/m LA Vol (A4C):   30.2 ml 14.75 ml/m LA Biplane Vol: 34.0 ml 16.60 ml/m  AORTIC VALVE AV Area (Vmax):    2.95 cm AV Area (Vmean):   3.02 cm AV Area (VTI):     3.10 cm AV Vmax:           170.00 cm/s AV Vmean:          117.500 cm/s AV VTI:            0.230 m AV Peak Grad:      11.6 mmHg AV Mean Grad:      6.0 mmHg LVOT Vmax:         132.00 cm/s LVOT Vmean:        93.300 cm/s LVOT VTI:          0.188 m LVOT/AV VTI ratio: 0.82  AORTA Ao Root diam: 2.90 cm Ao Asc diam:  3.20 cm MITRAL VALVE  TRICUSPID VALVE MV Area (PHT): 5.97 cm    TR Peak grad:   9.0 mmHg MV Decel Time: 127 msec    TR Vmax:        150.00 cm/s MV E velocity: 94.00 cm/s MV A velocity: 88.20 cm/s  SHUNTS MV E/A ratio:  1.07        Systemic VTI:   0.19 m                            Systemic Diam: 2.20 cm Lonni Nanas MD Electronically signed by Lonni Nanas MD Signature Date/Time: 05/04/2024/12:46:35 PM    Final       Signature  -   Lavada Stank M.D on 05/06/2024 at 9:45 AM   -  To page go to [email or web address]

## 2024-05-06 NOTE — Progress Notes (Signed)
 NEUROLOGY CONSULT FOLLOW UP NOTE   Date of service: May 06, 2024 Patient Name: Allison Moore MRN:  [id number] DOB:  11-29-1980  Interval Hx/subjective   Per RN and primary team 3 episodes of decreased responsiveness today, however awakens to voice immediately for me. EEG and VBG pending. Afebrile since 7/11, no nuchal rigidity. Patient states she feels better than she did yesterday and that her weakness is at baseline.  Vitals   Vitals:   05/05/24 2341 05/06/24 0400 05/06/24 0413 05/06/24 0747  BP: 129/63 (!) 140/64 126/65 (!) 119/52  Pulse: (!) 104 (!) 110 (!) 109 (!) 111  Resp: (!) 25 (!) 28 (!) 23 (!) 24  Temp: 98.5 F (36.9 C)  98.8 F (37.1 C) 98.7 F (37.1 C)  TempSrc: Oral  Oral Oral  SpO2: 96% 94% 95% 96%  Weight:      Height:         Body mass index is 38.22 kg/m.  Physical Exam   Constitutional: Appears well-developed and well-nourished.  Psych: Affect appropriate to situation.  Eyes: No scleral injection.  HENT: No OP obstrucion.  Head: Normocephalic.  Cardiovascular: Normal rate and regular rhythm.  Respiratory: Effort normal, non-labored breathing.  GI: Soft.  No distension. There is no tenderness.  Skin: WDI.   Neurologic Examination   Neuro: Mental Status: Patient is awake, alert, oriented to person, place, month, year, and situation. Patient is able to give a clear and coherent history. No signs of aphasia or neglect Cranial Nerves: II: Visual Fields are full. Pupils are equal, round, and reactive to light.   III,IV, VI: EOMI without ptosis or diploplia.  V: Facial sensation is symmetric to temperature VII: Facial movement is symmetric resting and smiling VIII: Hearing is intact to voice X: Palate elevates symmetrically XI: Shoulder shrug is symmetric. XII: Tongue protrudes midline without atrophy or fasciculations.  Motor: Tone is normal. Bulk is normal. 4+/5 strength to bilateral upper and lower extremities (4- at the deltoids  bilaterally on attending eval, some give way element here but at least 4+ to 5 everywhere else, a little weaker on the left than the right generally) Sensory: Sensation is symmetric to light touch and temperature in the arms and legs.  Coordination: FTN intact bilaterally Reflexes: 2+ biceps, brachioradialis, patellar, slightly increased on the left compared to the right.   Medications  Current Facility-Administered Medications:    acyclovir  (ZOVIRAX ) 365 mg in dextrose  5 % 100 mL IVPB, 5 mg/kg (Adjusted), Intravenous, Q8H, Dennise Lavada POUR, MD, Last Rate: 107.3 mL/hr at 05/06/24 0857, 365 mg at 05/06/24 0857   albuterol  (PROVENTIL ) (2.5 MG/3ML) 0.083% nebulizer solution 2.5 mg, 2.5 mg, Nebulization, Q6H PRN, Claudene, Rondell A, MD, 2.5 mg at 05/04/24 1152   atorvastatin  (LIPITOR) tablet 20 mg, 20 mg, Oral, QHS, Smith, Rondell A, MD, 20 mg at 05/05/24 2056   ciprofloxacin -dexamethasone  (CIPRODEX ) 0.3-0.1 % OTIC (EAR) suspension 4 drop, 4 drop, Left EAR, BID, Dennise Lavada POUR, MD, 4 drop at 05/06/24 0901   enoxaparin  (LOVENOX ) injection 40 mg, 40 mg, Subcutaneous, Daily, Singh, Prashant K, MD, 40 mg at 05/06/24 0859   fluconazole  (DIFLUCAN ) IVPB 400 mg, 400 mg, Intravenous, Daily, Hindman, Katherine G, RPH, Stopped at 05/05/24 2359   gabapentin  (NEURONTIN ) capsule 300 mg, 300 mg, Oral, TID, Claudene, Rondell A, MD, 300 mg at 05/06/24 9141   guaiFENesin  (MUCINEX ) 12 hr tablet 600 mg, 600 mg, Oral, BID, Smith, Rondell A, MD, 600 mg at 05/06/24 0858   hydrOXYzine  (ATARAX ) tablet 25 mg,  25 mg, Oral, TID PRN, Claudene, Rondell A, MD   ipratropium-albuterol  (DUONEB) 0.5-2.5 (3) MG/3ML nebulizer solution 3 mL, 3 mL, Nebulization, Q6H, Bowser, Grace E, NP, 3 mL at 05/06/24 0753   lactated ringers  infusion, , Intravenous, Continuous, Hammons, Suzen NOVAK, RPH, Last Rate: 50 mL/hr at 05/06/24 0852, New Bag at 05/06/24 9147   magic mouthwash w/lidocaine , 15 mL, Oral, TID AC & HS, Ghimire, Donalda HERO, MD, 15 mL at  05/06/24 0510   midodrine  (PROAMATINE ) tablet 10 mg, 10 mg, Oral, TID WC, Singh, Prashant K, MD, 10 mg at 05/06/24 0858   multivitamin with minerals tablet 1 tablet, 1 tablet, Oral, Daily, Claudene Reeves A, MD, 1 tablet at 05/06/24 0858   oxymetazoline  (AFRIN) 0.05 % nasal spray 1 spray, 1 spray, Each Nare, BID, Dennise Lavada POUR, MD, 1 spray at 05/06/24 0857   pantoprazole  (PROTONIX ) injection 40 mg, 40 mg, Intravenous, Q12H, Kerman Vina HERO, NP, 40 mg at 05/06/24 9160   piperacillin -tazobactam (ZOSYN ) IVPB 3.375 g, 3.375 g, Intravenous, Q8H, Hindman, Katherine G, RPH, Last Rate: 12.5 mL/hr at 05/06/24 0509, 3.375 g at 05/06/24 9490   risperiDONE  (RISPERDAL ) tablet 3 mg, 3 mg, Oral, QHS, Smith, Rondell A, MD, 3 mg at 05/05/24 2109   sertraline  (ZOLOFT ) tablet 150 mg, 150 mg, Oral, Daily, Claudene Reeves A, MD, 150 mg at 05/06/24 0858   sodium chloride  (OCEAN) 0.65 % nasal spray 1 spray, 1 spray, Each Nare, PRN, Claudene Reeves LABOR, MD, 1 spray at 05/04/24 2110  Labs and Diagnostic Imaging   CBC:  Recent Labs  Lab 05/05/24 0550 05/06/24 0802  WBC 13.6* 10.5  NEUTROABS 12.1* 8.4*  HGB 9.7* 9.3*  HCT 29.4* 28.0*  MCV 88.3 88.3  PLT 213 240    Basic Metabolic Panel:  Lab Results  Component Value Date   NA 137 05/05/2024   K 3.5 05/05/2024   CO2 20 (L) 05/05/2024   GLUCOSE 96 05/05/2024   BUN 12 05/05/2024   CREATININE 0.91 05/05/2024   CALCIUM  8.1 (L) 05/05/2024   GFRNONAA >60 05/05/2024   GFRAA >60 06/20/2020   Lipid Panel:  Lab Results  Component Value Date   LDLCALC 114 (H) 11/15/2023   HgbA1c:  Lab Results  Component Value Date   HGBA1C 5.1 11/15/2023   Urine Drug Screen:     Component Value Date/Time   LABOPIA NONE DETECTED 05/04/2024 0523   COCAINSCRNUR NONE DETECTED 05/04/2024 0523   LABBENZ NONE DETECTED 05/04/2024 0523   AMPHETMU NONE DETECTED 05/04/2024 0523   THCU POSITIVE (A) 05/04/2024 0523   LABBARB NONE DETECTED 05/04/2024 0523    Alcohol Level      Component Value Date/Time   ETH <10 11/10/2023 1224   INR  Lab Results  Component Value Date   INR 1.3 (H) 05/05/2024   CT Head without contrast(Personally reviewed): No acute abnormality, white matter hypoattenuation possibly reflecting demyelinating process, paranasal sinus disease   MRI Brain without contrast (Personally reviewed): No acute abnormality   MRI cervical and thoracic spine w/ and w/o (Personally reviewed): New subtle areas of increased T2 signal within spinal cord at C2 and C4, compatible with demyelinating plaques, normal thoracic spinal MRI [Do feel that this is a true finding despite motion limitation]  Assessment   Allison Moore is a 43 y.o. female of multiple sclerosis, anxiety, depression and obesity who presents with multiple falls, high fever, cough and chest congestion.   She started having increased falls shortly after starting Kesimpta, before it would be expected  to have taken full effect.  MRI cervical spine does seem to show new lesions from prior (though motion limited), and this certainly could result in gait impairment as well as contribute to her swallowing difficulty. However, certainly agree sinusitis and left mastoid effusion could be contributing. ID is following for possible pneumonia as well as otitis media externa with significant sinusitis. She does have new subtle T2 signal at C2 and C4 but these are not contrast enhancing. In light of her feeling that her strength is back to recent baseline and absence of contrast enhancement on MRI I do not feel that she requires IVMP at this time particularly in the setting of infection. Suspect transient worsening weakness 2/2 recrudescence in the setting of infection. She is amenable to continuing kesimpta as prescribed and following up with her outpatient provider. She just started kesimpta and would not consider the cervical cord lesions to represent a failure of that medication. EEG performed today for  decreased responsiveness was normal.   Recommendations  -Treatment of pneumonia per primary team -PT/OT/SLP --No indication for pulse dose steroids at this time --Patient amenable to continuing kesimpta at discharge as previously prescribed --F/u with established outpatient neurologist  Neurology will not continue to actively follow but pelase re-engage if additional neurologic concerns arise.  ______________________________________________________________________  Elida Ross, MD [facility] [phone]  If 7pm- 7am, please page neurology on call as listed in AMION.

## 2024-05-06 NOTE — Procedures (Signed)
 Patient Name: Allison Moore  MRN: [id number]  Epilepsy Attending: Arlin MALVA Krebs  Referring Physician/Provider: Dennise Lavada POUR, MD  Date: 05/06/2024 Duration: 23.05 mins  Patient history: 43yo F with ams. EEG to evaluate for seizure  Level of alertness: Awake  AEDs during EEG study: None  Technical aspects: This EEG study was done with scalp electrodes positioned according to the 10-20 International system of electrode placement. Electrical activity was reviewed with band pass filter of 1-70Hz , sensitivity of 7 uV/mm, display speed of 72mm/sec with a 60Hz  notched filter applied as appropriate. EEG data were recorded continuously and digitally stored.  Video monitoring was available and reviewed as appropriate.  Description: The posterior dominant rhythm consists of 8-9 Hz activity of moderate voltage (25-35 uV) seen predominantly in posterior head regions, symmetric and reactive to eye opening and eye closing. Physiologic photic driving was not seen during photic stimulation. Hyperventilation was not performed.     IMPRESSION: This study is within normal limits. No seizures or epileptiform discharges were seen throughout the recording.  A normal interictal EEG does not exclude the diagnosis of epilepsy.   Allison Moore

## 2024-05-06 NOTE — Progress Notes (Signed)
 [facility]  Date of Admission:  05/03/2024   Total days of inpatient antibiotics 2  Principal Problem:   Sepsis due to pneumonia [facility]) Active Problems:   Hyperlipidemia   OBESITY, NOS   Schizoaffective disorder, depressive type (HCC)   Multiple sclerosis (HCC)   Frequent falls   Acute respiratory failure with hypoxia (HCC)   Esophageal dysphagia   Odynophagia   Acute pansinusitis   Otitis externa   Pneumonia due to infectious organism   Acute respiratory distress          Assessment: 43 year old female admitted with #Otitis media externa with significant sinusitis #Multiple sclerosis started on kesimpta #Dysphagia mother secondary to #1 and immunocompromise state secondary to thrush versus HSV #Possible pneumonia  - CT chest initially has shown right hilar lower lobe collapse/consolidation could be related to pneumonia. - CT maxillofacial showed para sinusitis and multiple focal obstruction sinus drainage pathways, partial opacification left middle ear cavity-poor dentition with multifocal dental.  Dental disease.  Wire CTL showed increased T2 and spinal cord C2 and C4 compatible demyelinating plaques - Patient was started on acyclovir , vancomycin  was stopped fluconazole  continued. -Fever curve is trending down Recommendations: -Continue pip-tazo, fluconazole  acyclovir . - Given new finding on MRI, concern for worsening MS symptoms.  Discussed with neurology in regards to timing of starting steroids for MS symptoms.  If patient remains afebrile for the next 72 hours(pending negative blood Cx and cloinical progression) I think steroids are okay to discharge from the ID perspective as sinusitis/PNA generally treated for about 5-7 days(given immunosuppression would likely opt for 10 days).  Neurology plans on assessing patient tomorrow to see if range of motion is improved, need for steroids.  Patient has clinically improved significantly.  She  states today that she feels 100% better.  Evaluation of this patient requires complex antimicrobial therapy evaluation and counseling + isolation needs for disease transmission risk assessment and mitigation     Microbiology:   Antibiotics: Acyclovir  Fluconazole  7/11 present Pip-tazo 7/11-present 710 vancomycin  Cultures: Blood 7/10 no growth Urine  Other   SUBJECTIVE: Sitting in chair.  She reports that she feels much better today.  Family at bedside. Interval: Tmax 102.4 overnight.  WBC 13K.  Review of Systems: Review of Systems  All other systems reviewed and are negative.    Scheduled Meds:  atorvastatin   20 mg Oral QHS   ciprofloxacin -dexamethasone   4 drop Left EAR BID   enoxaparin  (LOVENOX ) injection  40 mg Subcutaneous Daily   gabapentin   300 mg Oral TID   guaiFENesin   600 mg Oral BID   ipratropium-albuterol   3 mL Nebulization Q6H   magic mouthwash w/lidocaine   15 mL Oral TID AC & HS   midodrine   10 mg Oral TID WC   multivitamin with minerals  1 tablet Oral Daily   oxymetazoline   1 spray Each Nare BID   pantoprazole  (PROTONIX ) IV  40 mg Intravenous Q12H   risperiDONE   3 mg Oral QHS   sertraline   150 mg Oral Daily   sodium chloride  HYPERTONIC  4 mL Nebulization Daily   Continuous Infusions:  acyclovir  Stopped (05/06/24 0206)   fluconazole  (DIFLUCAN ) IV Stopped (05/05/24 2359)   piperacillin -tazobactam (ZOSYN )  IV 3.375 g (05/06/24 0509)   PRN Meds:.albuterol , hydrOXYzine , sodium chloride  Allergies  Allergen Reactions   Norco [Hydrocodone -Acetaminophen ] Other (See Comments)    Confusion   Pork (Diagnostic) Other (See Comments)    Per pt, contraindicated due to MS  Keflex [Cephalexin] Rash    OBJECTIVE: Vitals:   05/05/24 2227 05/05/24 2341 05/06/24 0400 05/06/24 0413  BP:  129/63 (!) 140/64 126/65  Pulse:  (!) 104 (!) 110 (!) 109  Resp:  (!) 25 (!) 28 (!) 23  Temp: 98.7 F (37.1 C) 98.5 F (36.9 C)  98.8 F (37.1 C)  TempSrc: Oral Oral  Oral   SpO2:  96% 94% 95%  Weight:      Height:       Body mass index is 38.22 kg/m.  Physical Exam Constitutional:      Appearance: Normal appearance.  HENT:     Head: Normocephalic and atraumatic.     Right Ear: Tympanic membrane normal.     Left Ear: Tympanic membrane normal.     Nose: Nose normal.     Mouth/Throat:     Mouth: Mucous membranes are moist.  Eyes:     Extraocular Movements: Extraocular movements intact.     Conjunctiva/sclera: Conjunctivae normal.     Pupils: Pupils are equal, round, and reactive to light.  Cardiovascular:     Rate and Rhythm: Normal rate and regular rhythm.     Heart sounds: No murmur heard.    No friction rub. No gallop.  Pulmonary:     Effort: Pulmonary effort is normal.     Breath sounds: Normal breath sounds.  Abdominal:     General: Abdomen is flat.     Palpations: Abdomen is soft.  Skin:    General: Skin is warm and dry.  Neurological:     General: No focal deficit present.     Mental Status: She is alert and oriented to person, place, and time.  Psychiatric:        Mood and Affect: Mood normal.       Lab Results Lab Results  Component Value Date   WBC 13.6 (H) 05/05/2024   HGB 9.7 (L) 05/05/2024   HCT 29.4 (L) 05/05/2024   MCV 88.3 05/05/2024   PLT 213 05/05/2024    Lab Results  Component Value Date   CREATININE 0.91 05/05/2024   BUN 12 05/05/2024   NA 137 05/05/2024   K 3.5 05/05/2024   CL 108 05/05/2024   CO2 20 (L) 05/05/2024    Lab Results  Component Value Date   ALT 92 (H) 05/05/2024   AST 65 (H) 05/05/2024   ALKPHOS 78 05/05/2024   BILITOT 1.5 (H) 05/05/2024        Loney Stank, MD [facility] 05/06/2024, 7:30 AM

## 2024-05-06 NOTE — Consult Note (Signed)
 NAME:  Allison Moore, MRN:  [id number], DOB:  1981-05-17, LOS: 3 ADMISSION DATE:  05/03/2024, CONSULTATION DATE:  05/04/24 REFERRING MD:  Dennise, CHIEF COMPLAINT:  resp distress    History of Present Illness:  43 yo F PMH MS recently started on Kesimpta, obesity, mood disorder, HLD who was admitted to [facility] 7/10 for eval of fq falls and fever. Pt has had worsening gait and worsening incontinence since starting new MS med a few weeks ago. Frequent falls. No LOC. 3-4d PTA also c/o productive cough and frontal HA. She was started on abx for sepsis. Went for CT chest + maxillofacial 7/11. CT chest was quite limited by artifact but might have PNA and shows some atelectasis. CT maxillofacial w sinusitis, L mastoid effusion, peridontal disease, and chronic fx R orbital floor    Pertinent  Medical History  MS Obesity HLD   Significant Hospital Events: Including procedures, antibiotic start and stop dates in addition to other pertinent events   7/10 admitted 7/11 imaging w sinusitis mastoid effusion. Abx broadened. Worse resp status PCCM and ID consult.   Interim History / Subjective:  -Her wakefulness, mental status, ability to interact have been waxing and waning.  Currently awake, eating but per RN earlier this morning 7/13 she was lethargic, difficult to interact with, babbling    Objective    Blood pressure (!) 119/52, pulse (!) 111, temperature 98.7 F (37.1 C), temperature source Oral, resp. rate (!) 24, height 5' 4 (1.626 m), weight 101 kg, SpO2 96%.        Intake/Output Summary (Last 24 hours) at 05/06/2024 0851 Last data filed at 05/06/2024 9379 Gross per 24 hour  Intake 1910.19 ml  Output 500 ml  Net 1410.19 ml   Filed Weights   05/03/24 1136  Weight: 101 kg    Examination: General: Chronically ill-appearing obese woman laying in bed eating breakfast HENT: Strong voice, good cough strength, no secretions.  No stridor.  No nasal congestion or secretions Lungs: Clear  bilaterally, decreased to both bases Cardiovascular: Regular without a murmur Abdomen: Obese, soft, nondistended with positive bowel sounds Extremities: No deformities Neuro: Awake, generalized weakness but nonfocal.  Good cough strength.  No upper airway noise GU: Deferred  Resolved problem list  Hypertension Assessment and Plan   Severe sepsis in immunocomp host  -CAP vs aspiration PNA -sinusitis -L mastoid effusion, suspected otitis externa -possible esophagitis  P -[facility] has d/w ID who will see and recs zosyn , fluconazole , acyclovir   -soft pressures, but assuring LA. Cont to follow hemodynamics closely & will send for a repeat LA this afternoon  -inflammatory markers ordered  -ENT is to see -- will defer to them but wonder if she may benefit from some ear drops  -ECHO is already ordered. BNP was WNL  -No indication for pressors or ICU txf at this time, but certainly may change in future. She is quite sick, just not ICU level needs at present. Please call/page us  if re-eval is needed   Acute resp failure w hypoxia, improved, but still with questionable airway protection Possible aspiration PNA vs CAP  Atelectasis  -Pneumonia being treated - Most important urgent current respiratory issues are secretion management, airway protection which appears to be adequate at this time.  Tolerated modified diet 7/13.  Mental status and therefore airway protection has been waxing/waning, question occult seizure activity.  No clear sedating medication to impact her wakefulness.  Neurological workup underway.  EEG ordered today 7/13 - Continue aspiration precautions. She does  not merit transfer to ICU at this time.  - Continue good pulmonary hygiene  MS recently started on Kesimpta, question less effective Frequent falls at home - She feels functionally worse after starting the new medication but question whether this is being caused by infection/sepsis - MRI brain 7/11 with similar white matter  changes and demyelination changes.  MRI cervical spine 7/12 with new subtle areas of increased T2 signal at C2, C4 consistent with demyelination P -Neurology following, question whether she will receive pulsed steroids? Antiepileptic regimen  Anxiety depression Schizoaffective disorder  - Home meds  HLD - Statin   Code status discussion -Full code based on our discussions 7/11  Best Practice (right click and Reselect all SmartList Selections daily)   Diet/type: NPO DVT prophylaxis LMWH Pressure ulcer(s): pressure ulcer assessment deferred  GI prophylaxis: N/A Lines: N/A Foley:  N/A Code Status:  full code Last date of multidisciplinary goals of care discussion [7/11]  Labs   CBC: Recent Labs  Lab 05/03/24 1118 05/03/24 1203 05/04/24 0629 05/05/24 0550 05/06/24 0802  WBC 17.4*  --  13.6* 13.6* 10.5  NEUTROABS 16.0*  --   --  12.1* 8.4*  HGB 13.4 14.3 10.6* 9.7* 9.3*  HCT 40.6 42.0 32.9* 29.4* 28.0*  MCV 88.3  --  90.6 88.3 88.3  PLT 254  --  193 213 240    Basic Metabolic Panel: Recent Labs  Lab 05/03/24 1118 05/03/24 1203 05/04/24 0629 05/05/24 0550  NA 135 137 134* 137  K 3.8 3.7 3.7 3.5  CL 100 100 105 108  CO2 24  --  20* 20*  GLUCOSE 119* 115* 116* 96  BUN 11 11 16 12   CREATININE 0.98 1.00 1.18* 0.91  CALCIUM  9.4  --  7.9* 8.1*  MG  --   --  1.4* 2.5*  PHOS  --   --   --  2.9   GFR: Estimated Creatinine Clearance: 91.2 mL/min (by C-G formula based on SCr of 0.91 mg/dL). Recent Labs  Lab 05/03/24 1118 05/03/24 1204 05/03/24 1402 05/04/24 0629 05/04/24 1540 05/05/24 0550 05/06/24 0802  PROCALCITON  --   --  1.97 2.24  --  1.57  --   WBC 17.4*  --   --  13.6*  --  13.6* 10.5  LATICACIDVEN  --  1.2  --  1.7 1.4  --   --     Liver Function Tests: Recent Labs  Lab 05/03/24 1118 05/05/24 0550  AST 43* 65*  ALT 40 92*  ALKPHOS 66 78  BILITOT 1.2 1.5*  PROT 7.7 5.9*  ALBUMIN 3.6 2.5*   No results for input(s): LIPASE, AMYLASE  in the last 168 hours. No results for input(s): AMMONIA in the last 168 hours.  ABG    Component Value Date/Time   TCO2 24 05/03/2024 1203     Coagulation Profile: Recent Labs  Lab 05/03/24 1118 05/05/24 0819  INR 1.1 1.3*    Cardiac Enzymes: No results for input(s): CKTOTAL, CKMB, CKMBINDEX, TROPONINI in the last 168 hours.  HbA1C: Hemoglobin A1C  Date/Time Value Ref Range Status  10/31/2019 11:00 AM 5.0 4.0 - 5.6 % Final  12/07/2010 04:03 PM 5.2  Final   Hgb A1c MFr Bld  Date/Time Value Ref Range Status  11/15/2023 06:36 AM 5.1 4.8 - 5.6 % Final    Comment:    (NOTE) Pre diabetes:          5.7%-6.4%  Diabetes:              >  6.4%  Glycemic control for   <7.0% adults with diabetes      Critical care time: na     Lamar Chris, MD, PhD 05/06/2024, 9:03 AM [facility] [phone] or if no answer before 7:00PM call [phone] For any issues after 7:00PM please call eLink [phone]

## 2024-05-06 NOTE — Progress Notes (Incomplete)
         [facility]  Date of Admission:  05/03/2024   Total days of inpatient antibiotics ***  Principal Problem:   Sepsis due to pneumonia [facility]) Active Problems:   Hyperlipidemia   OBESITY, NOS   Schizoaffective disorder, depressive type (HCC)   Multiple sclerosis (HCC)   Frequent falls   Acute respiratory failure with hypoxia (HCC)   Esophageal dysphagia   Odynophagia   Acute pansinusitis   Otitis externa   Pneumonia due to infectious organism   Acute respiratory distress          Assessment: ***  Recommendations: ***   Microbiology:   Antibiotics:  Cultures: Blood  Urine  Other   SUBJECTIVE: *** Interval:   Review of Systems: ROS   Scheduled Meds:  atorvastatin   20 mg Oral QHS   ciprofloxacin -dexamethasone   4 drop Left EAR BID   enoxaparin  (LOVENOX ) injection  40 mg Subcutaneous Daily   gabapentin   300 mg Oral TID   guaiFENesin   600 mg Oral BID   ipratropium-albuterol   3 mL Nebulization Q6H   magic mouthwash w/lidocaine   15 mL Oral TID AC & HS   midodrine   10 mg Oral TID WC   multivitamin with minerals  1 tablet Oral Daily   oxymetazoline   1 spray Each Nare BID   pantoprazole  (PROTONIX ) IV  40 mg Intravenous Q12H   risperiDONE   3 mg Oral QHS   sertraline   150 mg Oral Daily   Continuous Infusions:  acyclovir  365 mg (05/06/24 1705)   fluconazole  (DIFLUCAN ) IV Stopped (05/06/24 1150)   lactated ringers  Stopped (05/06/24 1256)   piperacillin -tazobactam (ZOSYN )  IV 3.375 g (05/06/24 2059)   PRN Meds:.albuterol , hydrOXYzine , sodium chloride  Allergies  Allergen Reactions   Norco [Hydrocodone -Acetaminophen ] Other (See Comments)    Confusion   Pork (Diagnostic) Other (See Comments)    Per pt, contraindicated due to MS   Keflex [Cephalexin] Rash    OBJECTIVE: Vitals:   05/06/24 0747 05/06/24 1200 05/06/24 1935 05/06/24 1954  BP: (!) 119/52 94/60  (!) 104/59  Pulse: (!) 111 96  94  Resp: (!) 24 (!) 22  (!) 24   Temp: 98.7 F (37.1 C) 98.8 F (37.1 C)  97.9 F (36.6 C)  TempSrc: Oral Oral  Oral  SpO2: 96% 99% 100% 100%  Weight:      Height:       Body mass index is 38.22 kg/m.  Physical Exam    Lab Results Lab Results  Component Value Date   WBC 10.5 05/06/2024   HGB 9.3 (L) 05/06/2024   HCT 28.0 (L) 05/06/2024   MCV 88.3 05/06/2024   PLT 240 05/06/2024    Lab Results  Component Value Date   CREATININE 0.74 05/06/2024   BUN 11 05/06/2024   NA 137 05/06/2024   K 3.3 (L) 05/06/2024   CL 108 05/06/2024   CO2 21 (L) 05/06/2024    Lab Results  Component Value Date   ALT 65 (H) 05/06/2024   AST 28 05/06/2024   ALKPHOS 80 05/06/2024   BILITOT 1.0 05/06/2024        Loney Stank, MD [facility] 05/06/2024, 11:20 PM

## 2024-05-07 DIAGNOSIS — A419 Sepsis, unspecified organism: Secondary | ICD-10-CM | POA: Diagnosis not present

## 2024-05-07 DIAGNOSIS — J189 Pneumonia, unspecified organism: Secondary | ICD-10-CM | POA: Diagnosis not present

## 2024-05-07 LAB — CBC WITH DIFFERENTIAL/PLATELET
Abs Immature Granulocytes: 0.08 K/uL — ABNORMAL HIGH (ref 0.00–0.07)
Basophils Absolute: 0 K/uL (ref 0.0–0.1)
Basophils Relative: 0 %
Eosinophils Absolute: 0.3 K/uL (ref 0.0–0.5)
Eosinophils Relative: 4 %
HCT: 26.8 % — ABNORMAL LOW (ref 36.0–46.0)
Hemoglobin: 8.7 g/dL — ABNORMAL LOW (ref 12.0–15.0)
Immature Granulocytes: 1 %
Lymphocytes Relative: 18 %
Lymphs Abs: 1.5 K/uL (ref 0.7–4.0)
MCH: 28.9 pg (ref 26.0–34.0)
MCHC: 32.5 g/dL (ref 30.0–36.0)
MCV: 89 fL (ref 80.0–100.0)
Monocytes Absolute: 0.9 K/uL (ref 0.1–1.0)
Monocytes Relative: 11 %
Neutro Abs: 5.7 K/uL (ref 1.7–7.7)
Neutrophils Relative %: 66 %
Platelets: 276 K/uL (ref 150–400)
RBC: 3.01 MIL/uL — ABNORMAL LOW (ref 3.87–5.11)
RDW: 14 % (ref 11.5–15.5)
WBC: 8.6 K/uL (ref 4.0–10.5)
nRBC: 0 % (ref 0.0–0.2)

## 2024-05-07 LAB — COMPREHENSIVE METABOLIC PANEL WITH GFR
ALT: 50 U/L — ABNORMAL HIGH (ref 0–44)
AST: 19 U/L (ref 15–41)
Albumin: 2.4 g/dL — ABNORMAL LOW (ref 3.5–5.0)
Alkaline Phosphatase: 70 U/L (ref 38–126)
Anion gap: 7 (ref 5–15)
BUN: 7 mg/dL (ref 6–20)
CO2: 23 mmol/L (ref 22–32)
Calcium: 8.6 mg/dL — ABNORMAL LOW (ref 8.9–10.3)
Chloride: 108 mmol/L (ref 98–111)
Creatinine, Ser: 0.77 mg/dL (ref 0.44–1.00)
GFR, Estimated: >60 mL/min (ref >60)
Glucose, Bld: 98 mg/dL (ref 70–99)
Potassium: 3.6 mmol/L (ref 3.5–5.1)
Sodium: 138 mmol/L (ref 135–145)
Total Bilirubin: 0.7 mg/dL (ref 0.0–1.2)
Total Protein: 6 g/dL — ABNORMAL LOW (ref 6.5–8.1)

## 2024-05-07 LAB — PHOSPHORUS: Phosphorus: 3.5 mg/dL (ref 2.5–4.6)

## 2024-05-07 LAB — PROCALCITONIN: Procalcitonin: 0.49 ng/mL

## 2024-05-07 LAB — MAGNESIUM: Magnesium: 2 mg/dL (ref 1.7–2.4)

## 2024-05-07 LAB — C-REACTIVE PROTEIN: CRP: 12.6 mg/dL — ABNORMAL HIGH (ref <1.0)

## 2024-05-07 MED ORDER — ACYCLOVIR 200 MG PO CAPS
400.0000 mg | ORAL_CAPSULE | Freq: Every day | ORAL | Status: DC
  Administered 2024-05-07 – 2024-05-11 (×19): 400 mg via ORAL
  Filled 2024-05-07 (×21): qty 2

## 2024-05-07 MED ORDER — FUROSEMIDE 10 MG/ML IJ SOLN
20.0000 mg | Freq: Once | INTRAMUSCULAR | Status: AC
  Administered 2024-05-07: 20 mg via INTRAVENOUS
  Filled 2024-05-07: qty 2

## 2024-05-07 MED ORDER — FLUCONAZOLE 100 MG PO TABS
400.0000 mg | ORAL_TABLET | Freq: Every day | ORAL | Status: DC
  Administered 2024-05-07 – 2024-05-11 (×5): 400 mg via ORAL
  Filled 2024-05-07 (×5): qty 4

## 2024-05-07 NOTE — Progress Notes (Signed)
 Speech Language Pathology Treatment: Dysphagia  Patient Details Name: Allison Allison MRN: [id number] DOB: [birth year] Today's Date: 05/07/2024 Time: 8869-8854 SLP Time Calculation (min) (ACUTE ONLY): 15 min  Assessment / Plan / Recommendation Clinical Impression  Pt seen at bedside for follow up after BSE completed 05/05/24. Pt seated upright in bed. Visitor present. Pt declined PO trials at this time, stating she needed to be cleaned up and didn't want anything more to eat/drink at this time. Pt reports awareness of a narrow esophagus. SLP provided education regarding behavioral and dietary management of esophageal dysmotility. Written information left with pt/visitor. SLP will continue to follow to assess tolerance of current diet and readiness to advance textures.    HPI HPI: 43yo female admitted 05/03/24 with fall, fever (104.9 in ED), cough. PMH: MS (dx 2005), diffuse brain atrophy, anxiety, depression, obesity, multiple falls since starting Kesimpta 3wks ago. CXR - LLLopacity - atelectasis vx PNA, right lung clear. MRIbrain - no acute infarct      SLP Plan  Continue with current plan of care          Recommendations  Diet recommendations: Dysphagia 2 (fine chop);Thin liquid Liquids provided via: Cup;Straw Medication Administration: Whole meds with liquid Supervision: Staff to assist with self feeding;Patient able to self feed Compensations: Minimize environmental distractions;Slow rate;Small sips/bites;Follow solids with liquid Postural Changes and/or Swallow Maneuvers: Seated upright 90 degrees;Upright 30-60 min after meal        Oral care BID   Frequent or constant Supervision/Assistance Dysphagia, unspecified (R13.10)     Continue with current plan of care    Allison Allison, MSP, CCC-SLP Speech Language Pathologist Office: [phone]  Allison Allison 05/07/2024, 11:50 AM

## 2024-05-07 NOTE — Progress Notes (Signed)
 RE: Allison Moore Date of Birth: 06/09/1981 Date: 05/07/24  Please be advised that the above-named patient will require a short-term nursing home stay - anticipated 30 days or less for rehabilitation and strengthening.  The plan is for return home.

## 2024-05-07 NOTE — Progress Notes (Signed)
NIF= -15 

## 2024-05-07 NOTE — Plan of Care (Signed)
  Problem: Clinical Measurements: Goal: Ability to maintain clinical measurements within normal limits will improve Outcome: Progressing Goal: Will remain free from infection Outcome: Progressing   Problem: Activity: Goal: Risk for activity intolerance will decrease Outcome: Progressing   Problem: Safety: Goal: Ability to remain free from injury will improve Outcome: Progressing   

## 2024-05-07 NOTE — Progress Notes (Signed)
 NIF performed with good pt effort.  -24 NIF

## 2024-05-07 NOTE — TOC Initial Note (Signed)
 Transition of Care [facility]) - Initial/Assessment Note    Patient Details  Name: Allison Moore MRN: [id number] Date of Birth: [birth year]  [facility]) CM/SW Contact:    Robynn Eileen Hoose, RN Phone Number: 05/07/2024, 1:50 PM  Clinical Narrative:    Spoke with patient and mom at bedside. Per mom pt lives in a boarding home. Per patient she lives with 7 other people and is apart of [facility]. Patient confirms she has a walker. Patient currently on IV antibiotics. Mom will transport patient back to boarding home at discharge.              Expected Discharge Plan: Group Home (Boarding home) Barriers to Discharge: Continued Medical Work up   Patient Goals and CMS Choice            Expected Discharge Plan and Services       Living arrangements for the past 2 months: Boarding House                                      Prior Living Arrangements/Services Living arrangements for the past 2 months: [employer] Lives with::  (7 other people) Patient language and need for interpreter reviewed:: Yes Do you feel safe going back to the place where you live?: Yes      Need for Family Participation in Patient Care: No (Comment) Care giver support system in place?: Yes (comment) Current home services: DME [facility]) Criminal Activity/Legal Involvement Pertinent to Current Situation/Hospitalization: No - Comment as needed  Activities of Daily Living   ADL Screening (condition at time of admission) Independently performs ADLs?: Yes (appropriate for developmental age) Is the patient deaf or have difficulty hearing?: No Does the patient have difficulty seeing, even when wearing glasses/contacts?: No Does the patient have difficulty concentrating, remembering, or making decisions?: No  Permission Sought/Granted                  Emotional Assessment Appearance:: Appears stated age Attitude/Demeanor/Rapport: Engaged Affect (typically observed):  Appropriate Orientation: : Oriented to Self, Oriented to Place, Oriented to  Time Alcohol / Substance Use: Not Applicable Psych Involvement: No (comment)  Admission diagnosis:  Sepsis due to pneumonia (HCC) [J18.9, A41.9] Pneumonia due to infectious organism, unspecified laterality, unspecified part of lung [J18.9] Sepsis, due to unspecified organism, unspecified whether acute organ dysfunction present [facility]) [A41.9] Patient Active Problem List   Diagnosis Date Noted   Acute respiratory distress 05/05/2024   Acute respiratory failure with hypoxia (HCC) 05/04/2024   Esophageal dysphagia 05/04/2024   Odynophagia 05/04/2024   Acute pansinusitis 05/04/2024   Otitis externa 05/04/2024   Pneumonia due to infectious organism 05/04/2024   Sepsis due to pneumonia (HCC) 05/03/2024   Frequent falls 05/03/2024   Bipolar 1 disorder (HCC) 11/11/2023   Cocaine abuse (HCC) 07/29/2021   Acute pain of right shoulder 03/19/2021   Missed period 09/05/2020   Right arm pain 09/05/2020   Groin pain, left 03/04/2020   Encounter for pregnancy test, result negative 09/18/2019   Generalized abdominal pain 08/03/2019   Allergic rhinitis 06/03/2016   Anxiety 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 03/05/2014   Contraception management 05/11/2013   Cannabis abuse 04/27/2013   Schizoaffective disorder, depressive type (HCC) 04/07/2010   History of tobacco abuse 10/27/2009   Bipolar disorder (HCC) 11/16/2007   Hyperlipidemia 12/22/2006   OBESITY, NOS 12/22/2006   Multiple sclerosis (HCC) 12/22/2006  PCP:  [name] [name], PA Pharmacy:   [facility] [address] [place] - [address] AT [address] [place] [address] Phone: [phone] Fax: [phone]     Social Drivers of Health (SDOH) Social History: SDOH Screenings   Food Insecurity: Food Insecurity Present (05/03/2024)  Housing: High Risk (05/03/2024)  Transportation Needs: Patient Unable To Answer (05/03/2024)   Utilities: Patient Declined (05/03/2024)  Alcohol Screen: Low Risk  (11/11/2023)  Depression (PHQ2-9): Low Risk  (09/22/2021)  Tobacco Use: High Risk (05/03/2024)   SDOH Interventions: Food Insecurity Interventions: [employer] Provided Housing Interventions: Community Resources Provided   Readmission Risk Interventions     No data to display

## 2024-05-07 NOTE — Progress Notes (Addendum)
 Mobility Specialist: Progress Note   05/07/24 1450  Oxygen Therapy  O2 Device Nasal Cannula  O2 Flow Rate (L/min) 2 L/min  Mobility  Activity Ambulated with assistance in room;Transferred from bed to chair  Level of Assistance Contact guard assist, steadying assist  Assistive Device Front wheel walker  Distance Ambulated (ft) 6 ft  Activity Response Tolerated well  Mobility Referral Yes  Mobility visit 1 Mobility  Mobility Specialist Start Time (ACUTE ONLY) 1207  Mobility Specialist Stop Time (ACUTE ONLY) 1224  Mobility Specialist Time Calculation (min) (ACUTE ONLY) 17 min    Pt received in bed, agreeable to mobility session. SV for bed mobility. ModA for STS. MinA for pericare assist from MS. CGA for short bout of ambulation to the chair. Left in chair with all needs met, call bell in reach. Chair alarm on.   Ileana Lute Mobility Specialist Please contact via SecureChat or Rehab office at [phone]

## 2024-05-07 NOTE — NC FL2 (Signed)
 [place]  MEDICAID FL2 LEVEL OF CARE FORM     IDENTIFICATION  Patient Name: Allison Moore Birthdate: 1980/12/05 Sex: female Admission Date (Current Location): 05/03/2024  [facility] and [place] Number:  Producer, television/film/video and Address:  [facility], [address], [place], [place] [address]      Provider Number: [id number]  Attending Physician Name and Address:  Dennise Lavada POUR, MD  Relative Name and Phone Number:       Current Level of Care: Hospital Recommended Level of Care: Skilled Nursing Facility Prior Approval Number:    Date Approved/Denied:   PASRR Number: pending  Discharge Plan: SNF    Current Diagnoses: Patient Active Problem List   Diagnosis Date Noted   Acute respiratory distress 05/05/2024   Acute respiratory failure with hypoxia (HCC) 05/04/2024   Esophageal dysphagia 05/04/2024   Odynophagia 05/04/2024   Acute pansinusitis 05/04/2024   Otitis externa 05/04/2024   Pneumonia due to infectious organism 05/04/2024   Sepsis due to pneumonia (HCC) 05/03/2024   Frequent falls 05/03/2024   Bipolar 1 disorder (HCC) 11/11/2023   Cocaine abuse (HCC) 07/29/2021   Acute pain of right shoulder 03/19/2021   Missed period 09/05/2020   Right arm pain 09/05/2020   Groin pain, left 03/04/2020   Encounter for pregnancy test, result negative 09/18/2019   Generalized abdominal pain 08/03/2019   Allergic rhinitis 06/03/2016   Anxiety 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 03/05/2014   Contraception management 05/11/2013   Cannabis abuse 04/27/2013   Schizoaffective disorder, depressive type (HCC) 04/07/2010   History of tobacco abuse 10/27/2009   Bipolar disorder (HCC) 11/16/2007   Hyperlipidemia 12/22/2006   OBESITY, NOS 12/22/2006   Multiple sclerosis (HCC) 12/22/2006    Orientation RESPIRATION BLADDER Height & Weight     Self, Time, Situation, Place  O2 (2L nasal cannula) Incontinent, External catheter Weight: 222 lb 10.6 oz (101  kg) Height:  5' 4 (162.6 cm)  BEHAVIORAL SYMPTOMS/MOOD NEUROLOGICAL BOWEL NUTRITION STATUS      Continent Diet (See dc summary)  AMBULATORY STATUS COMMUNICATION OF NEEDS Skin   Limited Assist Verbally Normal                       Personal Care Assistance Level of Assistance  Bathing, Feeding, Dressing Bathing Assistance: Limited assistance Feeding assistance: Independent Dressing Assistance: Limited assistance     Functional Limitations Info             SPECIAL CARE FACTORS FREQUENCY  PT (By licensed PT), OT (By licensed OT)     PT Frequency: 5x/week OT Frequency: 5x/week            Contractures Contractures Info: Not present    Additional Factors Info  Code Status, Allergies Code Status Info: Full Allergies Info: Norco (Hydrocodone -acetaminophen ), Pork (Diagnostic), Keflex (Cephalexin)           Current Medications (05/07/2024):  This is the current hospital active medication list Current Facility-Administered Medications  Medication Dose Route Frequency Provider Last Rate Last Admin   acyclovir  (ZOVIRAX ) 200 MG capsule 400 mg  400 mg Oral 5 X Daily Dennise Kingsley, MD       albuterol  (PROVENTIL ) (2.5 MG/3ML) 0.083% nebulizer solution 2.5 mg  2.5 mg Nebulization Q6H PRN Smith, Rondell A, MD   2.5 mg at 05/04/24 1152   atorvastatin  (LIPITOR) tablet 20 mg  20 mg Oral QHS Smith, Rondell A, MD   20 mg at 05/06/24 2100   ciprofloxacin -dexamethasone  (CIPRODEX )  0.3-0.1 % OTIC (EAR) suspension 4 drop  4 drop Left EAR BID Singh, Prashant K, MD   4 drop at 05/07/24 1049   enoxaparin  (LOVENOX ) injection 40 mg  40 mg Subcutaneous Daily Singh, Prashant K, MD   40 mg at 05/07/24 1033   fluconazole  (DIFLUCAN ) tablet 400 mg  400 mg Oral Daily Singh, Mayanka, MD   400 mg at 05/07/24 1215   gabapentin  (NEURONTIN ) capsule 300 mg  300 mg Oral TID Smith, Rondell A, MD   300 mg at 05/07/24 1035   guaiFENesin  (MUCINEX ) 12 hr tablet 600 mg  600 mg Oral BID Smith, Rondell A, MD   600  mg at 05/07/24 1035   hydrOXYzine  (ATARAX ) tablet 25 mg  25 mg Oral TID PRN Claudene Maximino LABOR, MD       ipratropium-albuterol  (DUONEB) 0.5-2.5 (3) MG/3ML nebulizer solution 3 mL  3 mL Nebulization Q6H Bowser, Grace E, NP   3 mL at 05/07/24 1450   lactated ringers  infusion   Intravenous Continuous Hammons, Kimberly B, RPH   Stopped at 05/06/24 1256   magic mouthwash w/lidocaine   15 mL Oral TID AC & HS Raenelle Donalda HERO, MD   15 mL at 05/07/24 1038   midodrine  (PROAMATINE ) tablet 10 mg  10 mg Oral TID WC Singh, Prashant K, MD   10 mg at 05/07/24 1335   multivitamin with minerals tablet 1 tablet  1 tablet Oral Daily Claudene Maximino A, MD   1 tablet at 05/07/24 1035   oxymetazoline  (AFRIN) 0.05 % nasal spray 1 spray  1 spray Each Nare BID Singh, Prashant K, MD   1 spray at 05/07/24 1047   pantoprazole  (PROTONIX ) injection 40 mg  40 mg Intravenous Q12H Guenther, Paula M, NP   40 mg at 05/07/24 1044   piperacillin -tazobactam (ZOSYN ) IVPB 3.375 g  3.375 g Intravenous Q8H Hindman, Katherine G, RPH 12.5 mL/hr at 05/07/24 1337 3.375 g at 05/07/24 1337   risperiDONE  (RISPERDAL ) tablet 3 mg  3 mg Oral QHS Smith, Rondell A, MD   3 mg at 05/06/24 2059   sertraline  (ZOLOFT ) tablet 150 mg  150 mg Oral Daily Smith, Rondell A, MD   150 mg at 05/07/24 1034   sodium chloride  (OCEAN) 0.65 % nasal spray 1 spray  1 spray Each Nare PRN Claudene Maximino LABOR, MD   1 spray at 05/04/24 2110     Discharge Medications: Please see discharge summary for a list of discharge medications.  Relevant Imaging Results:  Relevant Lab Results:   Additional Information SSN [id number]  Inocente GORMAN Kindle, LCSW

## 2024-05-07 NOTE — Progress Notes (Signed)
 [facility]  Date of Admission:  05/03/2024   Total days of inpatient antibiotics 4  Principal Problem:   Sepsis due to pneumonia [facility]) Active Problems:   Hyperlipidemia   OBESITY, NOS   Schizoaffective disorder, depressive type (HCC)   Multiple sclerosis (HCC)   Frequent falls   Acute respiratory failure with hypoxia (HCC)   Esophageal dysphagia   Odynophagia   Acute pansinusitis   Otitis externa   Pneumonia due to infectious organism   Acute respiratory distress          Assessment: 43 year old female admitted with #Otitis media externa with significant sinusitis #Multiple sclerosis started on kesimpta #Dysphagia mother secondary to #1 and immunocompromise state secondary to thrush versus HSV #Possible pneumonia #Fever and leukocytosis -resolved  - CT chest initially has shown right hilar lower lobe collapse/consolidation could be related to pneumonia. - CT maxillofacial showed para sinusitis and multiple focal obstruction sinus drainage pathways, partial opacification left middle ear cavity-poor dentition with multifocal dental.  Dental disease.  Wire CTL showed increased T2 and spinal cord C2 and C4 compatible demyelinating plaques - Patient was started on acyclovir , vancomycin  was stopped fluconazole  continued. -Fever curve is trending down - Given new finding on MRI, concern for worsening MS symptoms.  Discussed with neurology in regards to timing of starting steroids for MS symptoms.  If patient remains afebrile for the next 72 hours(pending negative blood Cx and cloinical progression) I think steroids are okay to discharge from the ID perspective as sinusitis/PNA generally treated for about 5-7 days(given immunosuppression would likely opt for 10 days).  Neurology plans on assessing patient tomorrow to see if range of motion is improved, need for steroids.  Patient has clinically improved significantly.  She states today that she feels  100% better. Recommendations: -Continue pip-tazo, fluconazole  acyclovir . 7/16 will be day 7, if pt continues to be clinically stable(afebrile/no leukocytosis_ ok to start steroids form ID perspective on day 7 if needed  Evaluation of this patient requires complex antimicrobial therapy evaluation and counseling + isolation needs for disease transmission risk assessment and mitigation     Microbiology:   Antibiotics: Acyclovir  Fluconazole  7/11 present Pip-tazo 7/11-present 710 vancomycin  Cultures: Blood 7/10 no growth Urine  Other   SUBJECTIVE: Laying in bed.  She reports that she feels well Interval: Afebrile overnight,   Review of Systems: Review of Systems  All other systems reviewed and are negative.    Scheduled Meds:  acyclovir   400 mg Oral 5 X Daily   atorvastatin   20 mg Oral QHS   ciprofloxacin -dexamethasone   4 drop Left EAR BID   enoxaparin  (LOVENOX ) injection  40 mg Subcutaneous Daily   fluconazole   400 mg Oral Daily   gabapentin   300 mg Oral TID   guaiFENesin   600 mg Oral BID   ipratropium-albuterol   3 mL Nebulization Q6H   magic mouthwash w/lidocaine   15 mL Oral TID AC & HS   midodrine   10 mg Oral TID WC   multivitamin with minerals  1 tablet Oral Daily   oxymetazoline   1 spray Each Nare BID   pantoprazole  (PROTONIX ) IV  40 mg Intravenous Q12H   risperiDONE   3 mg Oral QHS   sertraline   150 mg Oral Daily   Continuous Infusions:  lactated ringers  Stopped (05/06/24 1256)   piperacillin -tazobactam (ZOSYN )  IV 3.375 g (05/07/24 0451)   PRN Meds:.albuterol , hydrOXYzine , sodium chloride  Allergies  Allergen Reactions   Norco [Hydrocodone -Acetaminophen ] Other (See  Comments)    Confusion   Pork (Diagnostic) Other (See Comments)    Per pt, contraindicated due to MS   Keflex [Cephalexin] Rash    OBJECTIVE: Vitals:   05/07/24 0005 05/07/24 0450 05/07/24 0735 05/07/24 0811  BP: 109/82 113/66  (!) 130/57  Pulse: 97 99  (!) 101  Resp: (!) 24 (!) 24 (!) 23  16  Temp: 98.2 F (36.8 C) 99.1 F (37.3 C)  99.1 F (37.3 C)  TempSrc: Oral Oral  Oral  SpO2: 100% 100%  100%  Weight:      Height:       Body mass index is 38.22 kg/m.  Physical Exam Constitutional:      Appearance: Normal appearance.  HENT:     Head: Normocephalic and atraumatic.     Right Ear: Tympanic membrane normal.     Left Ear: Tympanic membrane normal.     Nose: Nose normal.     Mouth/Throat:     Mouth: Mucous membranes are moist.  Eyes:     Extraocular Movements: Extraocular movements intact.     Conjunctiva/sclera: Conjunctivae normal.     Pupils: Pupils are equal, round, and reactive to light.  Cardiovascular:     Rate and Rhythm: Normal rate and regular rhythm.     Heart sounds: No murmur heard.    No friction rub. No gallop.  Pulmonary:     Effort: Pulmonary effort is normal.     Breath sounds: Normal breath sounds.  Abdominal:     General: Abdomen is flat.     Palpations: Abdomen is soft.  Skin:    General: Skin is warm and dry.  Neurological:     General: No focal deficit present.     Mental Status: She is alert and oriented to person, place, and time.  Psychiatric:        Mood and Affect: Mood normal.       Lab Results Lab Results  Component Value Date   WBC 8.6 05/07/2024   HGB 8.7 (L) 05/07/2024   HCT 26.8 (L) 05/07/2024   MCV 89.0 05/07/2024   PLT 276 05/07/2024    Lab Results  Component Value Date   CREATININE 0.77 05/07/2024   BUN 7 05/07/2024   NA 138 05/07/2024   K 3.6 05/07/2024   CL 108 05/07/2024   CO2 23 05/07/2024    Lab Results  Component Value Date   ALT 50 (H) 05/07/2024   AST 19 05/07/2024   ALKPHOS 70 05/07/2024   BILITOT 0.7 05/07/2024        Loney Stank, MD [facility] 05/07/2024, 12:41 PM

## 2024-05-07 NOTE — TOC Progression Note (Signed)
 Transition of Care [facility]) - Progression Note    Patient Details  Name: Allison Moore MRN: [id number] Date of Birth: Apr 04, 1981  Transition of Care [facility]) CM/SW Contact  Inocente GORMAN Kindle, LCSW Phone Number: 05/07/2024, 2:38 PM  Clinical Narrative:    CSW met with patient and her mother at bedside with verbal consent. CSW discussed SNF recommendation and patient stated she would rather do home health. Patient's mother stated that she would not be safe to do home health as the other ladies in the boarding house will not help her. Patient stated, don't listen to my mom. I will enroll in the [facility] for exercise. CSW asked how she would get there and she stated the bus. CSW pointed out that she is not walking very well at this point but patient stated that she can walk. Patient's mother reported that patient's family is currently unable to care for patient at home given patient's current physical needs and fall risk. CSW discussed insurance authorization process and will provide Medicare SNF ratings list. Patient reluctantly agreed to review bed offers once available and see how she is feeling. CSW will send out referrals for review and provide bed offers as available.   Skilled Nursing Rehab Facilities-   [email or web address]   Ratings out of 5 stars (5 the highest)  Name Address  Phone # Quality Care Staffing Health Inspection Overall  [facility] [address], [place] [phone] 3 1 4 3   [facility] [address] [place] [phone] 5 2 4 5   [facility] [address], [place] [phone] 2 1 1 1   [facility] [address], [place] [phone] 4 3 4 4   [facility]  [address], [place] [phone] 5 3 5 5   [facility] [address], [facility] [phone] 5 3 2 3   [facility] [address], [place] [phone] 5 1 2 2   [facility] [address], [place]  [phone] 2 3 4 4   [address] ([place]) [address] [place] [phone] 1 3 3 2   [facility] [address] [place], [place] [phone] 3 1 2 1   [facility] ([place] [address], [place] [phone] 3 1 1 1   Clotilda Pereyra [address] [place] [phone] 3 3 4 4   [facility] [address] [place] [phone] 4 1 2 1   [facility]) [address], [place] [phone] 1 1 4 2           [facility] [address], [place] [phone] 3 1 5 4   [facility] [address], [place] [phone] 4 1 1 1   [facility]  [address] [place] [phone] 2 3 1 1   [facility] [place] [address] [phone] 2 2 4 4   [facility] [address], [place] [phone] 2 2 3 3           [facility] [address] [place] [phone] 2 1 2 1   [facility] (No [facility]) [address], [place] [phone] 5 4 4 5   [facility] [address] [facility] [phone] 3 4 4 4   [facility] [address] [place] [phone] 3 1 2 1   [place] [facility] [address] [place] [phone] 3 2 2 2   [facility] [address] [place] [phone] 1 3 3 2   [facility] [address], [place] [phone] 2 2 3 3   [facility] [phone]  [address] [place], [place] [phone] 2 1 4 3   [facility] [address], [facility] [phone] 2 1 1 1   [facility] [address] [place], [place] [phone] 2 1 2 1   [facility] [address] [place] [phone] 3 1 2 1           [facility] [address], [place] [phone] 4 1 2 1   [facility] [address] [place]  [phone] 3 4 4 4   [facility] (No [employer]) [address], [place] [phone] 3 2 5 5   [facility]) [address], [place] [phone] 2 2 3 3    [facility] [place] [address], [place] [phone] 5 3 5 5   [facility] [address], [place] [phone] 2 1 1 1   [facility] [address] [place], [place] [phone] 3 5 5 5           [facility] [address] [place], [place] [phone] 5 4 5 5   [facility])  [address] [place] [phone] 1 1 2 1   [facility]) [address] [place] [phone]  2 4 4   [facility] [address], [place] [phone] 3 5 5 5   [address] [place], [place] [phone] 4 2 2 2   [facility]) [address] [place] [phone] 1 1 3 1   [facility] [address], [place] [phone] 2 2 2 2       Expected Discharge Plan: Skilled Nursing Facility Barriers to Discharge: Continued Medical Work up, English as a second language teacher, SNF Pending bed offer  Expected Discharge Plan and Services In-house Referral: Clinical Social Work     Living arrangements for the past 2 months: Boarding House                                       Social Determinants of Health (SDOH) Interventions SDOH Screenings   Food Insecurity: Food Insecurity Present (05/03/2024)  Housing: High Risk (05/03/2024)  Transportation Needs: Patient Unable To Answer (05/03/2024)  Utilities: Patient Declined (05/03/2024)  Alcohol Screen: Low Risk  (11/11/2023)  Depression (PHQ2-9): Low Risk  (09/22/2021)  Tobacco Use: High Risk (05/03/2024)    Readmission Risk Interventions     No data to display

## 2024-05-07 NOTE — Plan of Care (Signed)
 Patient alert and oriented. Watching television with call light within reach. Denies pain.  NAD noted. No complaints currently voiced during bedside shift report. Problem: Education: Goal: Knowledge of General Education information will improve Description: Including pain rating scale, medication(s)/side effects and non-pharmacologic comfort measures Outcome: Progressing   Problem: Health Behavior/Discharge Planning: Goal: Ability to manage health-related needs will improve Outcome: Progressing   Problem: Clinical Measurements: Goal: Ability to maintain clinical measurements within normal limits will improve Outcome: Progressing Goal: Will remain free from infection Outcome: Progressing Goal: Diagnostic test results will improve Outcome: Progressing Goal: Respiratory complications will improve Outcome: Progressing Goal: Cardiovascular complication will be avoided Outcome: Progressing   Problem: Activity: Goal: Risk for activity intolerance will decrease Outcome: Progressing   Problem: Nutrition: Goal: Adequate nutrition will be maintained Outcome: Progressing   Problem: Coping: Goal: Level of anxiety will decrease Outcome: Progressing   Problem: Elimination: Goal: Will not experience complications related to bowel motility Outcome: Progressing Goal: Will not experience complications related to urinary retention Outcome: Progressing   Problem: Pain Managment: Goal: General experience of comfort will improve and/or be controlled Outcome: Progressing   Problem: Safety: Goal: Ability to remain free from injury will improve Outcome: Progressing   Problem: Skin Integrity: Goal: Risk for impaired skin integrity will decrease Outcome: Progressing   Problem: Activity: Goal: Ability to tolerate increased activity will improve Outcome: Progressing   Problem: Clinical Measurements: Goal: Ability to maintain a body temperature in the normal range will improve Outcome:  Progressing   Problem: Respiratory: Goal: Ability to maintain adequate ventilation will improve Outcome: Progressing Goal: Ability to maintain a clear airway will improve Outcome: Progressing

## 2024-05-07 NOTE — Progress Notes (Signed)
 Mobility Specialist: Progress Note   05/07/24 1548  Mobility  Activity Transferred from chair to bed;Ambulated with assistance in room  Level of Assistance Contact guard assist, steadying assist  Assistive Device Front wheel walker  Distance Ambulated (ft) 6 ft  Activity Response Tolerated well  Mobility Referral Yes  Mobility visit 1 Mobility  Mobility Specialist Start Time (ACUTE ONLY) 1505  Mobility Specialist Stop Time (ACUTE ONLY) 1518  Mobility Specialist Time Calculation (min) (ACUTE ONLY) 13 min    Pt received in chair, requesting assistance back to bed. MinA for STS from chair. CGA for ambulation back to bed. No complaints. Left in bed with all needs met, call bell in reach.   Ileana Lute Mobility Specialist Please contact via SecureChat or Rehab office at [phone]

## 2024-05-07 NOTE — Care Management Important Message (Signed)
 Important Message  Patient Details  Name: Allison Moore MRN: [id number] Date of Birth: [birth year]   Important Message Given:  Yes - Medicare IM     Claretta Deed 05/07/2024, 3:55 PM

## 2024-05-07 NOTE — Progress Notes (Signed)
 PROGRESS NOTE        PATIENT DETAILS Name: Allison Moore Age: 43 y.o. Sex: female Date of Birth: 07-06-1981 Admit Date: 05/03/2024 Admitting Physician Allison DELENA Sharps, MD ERE:Cjwdunmb, Allison [name], PA  Brief Summary: Patient is a 43 y.o.  female with history of MS (walker at baseline)-who was on Kesimpta injections in May 2025 (?3 doses so far)-who presented to the ED for 4 to 5-day history of subjective fever, nasal congestion, shortness of breath, left ear watery discharge, odynophagia and frequent falls.  She was found to have pneumonia-with sepsis physiology and subsequently admitted to the hospitalist service.  Significant events: 7/10>> admit to [facility]-sepsis-frequent falls-PNA-URI symptoms-left ear discharge.  Significant studies: 7/10>> CT head: No acute intracranial abnormality 7/11>> CXR: Left basilar opacity.  Significant microbiology data: 7/10>> COVID/influenza/RSV PCR: Negative 7/10>> blood cultures: Pending 7/11>> respiratory virus panel: Negative  Procedures: None  Consults: ENT ID GI PCCM  Subjective: Patient in bed more awake and alert this morning denies any headache chest or abdominal pain, no shortness of breath  Objective: Vitals: Blood pressure (!) 130/57, pulse (!) 101, temperature 99.1 F (37.3 C), temperature source Oral, resp. rate 16, height 5' 4 (1.626 m), weight 101 kg, SpO2 100%.   Exam:  Awake, more alert morning of 05/07/2024, No new F.N deficits, Normal affect [place].AT,PERRAL Supple Neck, No JVD,   Symmetrical Chest wall movement, few bibasilar crackles RRR,No Gallops, Rubs or new Murmurs,  +ve B.Sounds, Abd Soft, No tenderness,   No Cyanosis, Clubbing or edema    Assessment/Plan:  Sepsis-with left otitis externa, acute on chronic sinusitis, possible aspiration pneumonia, possible esophagitis in a patient who is immunocompromised due to being on ofatumumab injections for multiple sclerosis  He has been  seen by ENT Allison Moore, PCCM Allison Moore, GI Allison Moore and ID Allison Moore, case discussed with ENT and PCCM, currently on empiric IV antibiotics, IV Diflucan  and IV acyclovir , Cipro  eardrops to the left ear ordered after discussions with ENT, clinically she has shown improvement on 05/05/2024.  Sepsis pathophysiology is resolving, speech therapy also following closely as aspiration of oral secretions is high on the list.  Speech following currently on a dysphagia 2 diet, GI also contemplating possible EGD later this admission.  Ongoing dysphagia and some odynophagia.  Currently n.p.o., speech GI on board.  On fluconazole  for high risk of Candida esophagitis due to being immunocompromised, defer further GI workup to gastroenterology team  History of MS with multiple falls Discussed with neurology-no headache, no meningismus, no photophobia, MRI brain nonacute, MRI C and T-spine ordered with potential new lesions in the C-spine, neurology is following.  Waxing and waning mental status on 05/06/2024.  Metabolic encephalopathy, able EEG and VBG, neuro and ID along with PCCM following.  Mentation has improved on 05/07/2024.   Hypomagnesemia.  Replaced.    HLD Statin  Schizoaffective disorder Anxiety/depression Anxiety slightly worse due to acute illness Continue Zoloft /risperidone   Class 2 Obesity: Estimated body mass index is 38.22 kg/m as calculated from the following:   Height as of this encounter: 5' 4 (1.626 m).   Weight as of this encounter: 101 kg.   Code status:   Code Status: Full Code   DVT Prophylaxis: enoxaparin  (LOVENOX ) injection 40 mg Start: 05/06/24 1000 SCDs Start: 05/03/24 1357    Family Communication: Father-Allison Moore-[phone] updated site 05/05/2024   Disposition Plan:  Status is: Inpatient Remains inpatient appropriate because: Severity of illness   Planned Discharge Destination:Home health versus SNF   Diet: Diet Order             DIET DYS 2 Fluid  consistency: Thin  Diet effective now                     Data Review:   Inpatient Medications  Scheduled Meds:  atorvastatin   20 mg Oral QHS   ciprofloxacin -dexamethasone   4 drop Left EAR BID   enoxaparin  (LOVENOX ) injection  40 mg Subcutaneous Daily   gabapentin   300 mg Oral TID   guaiFENesin   600 mg Oral BID   ipratropium-albuterol   3 mL Nebulization Q6H   magic mouthwash w/lidocaine   15 mL Oral TID AC & HS   midodrine   10 mg Oral TID WC   multivitamin with minerals  1 tablet Oral Daily   oxymetazoline   1 spray Each Nare BID   pantoprazole  (PROTONIX ) IV  40 mg Intravenous Q12H   risperiDONE   3 mg Oral QHS   sertraline   150 mg Oral Daily   Continuous Infusions:  acyclovir  Stopped (05/07/24 0215)   fluconazole  (DIFLUCAN ) IV Stopped (05/06/24 1150)   lactated ringers  Stopped (05/06/24 1256)   piperacillin -tazobactam (ZOSYN )  IV 3.375 g (05/07/24 0451)   PRN Meds:.albuterol , hydrOXYzine , sodium chloride   DVT Prophylaxis  enoxaparin  (LOVENOX ) injection 40 mg Start: 05/06/24 1000 SCDs Start: 05/03/24 1357   Recent Labs  Lab 05/03/24 1118 05/03/24 1203 05/04/24 0629 05/05/24 0550 05/06/24 0802 05/07/24 0556  WBC 17.4*  --  13.6* 13.6* 10.5 8.6  HGB 13.4 14.3 10.6* 9.7* 9.3* 8.7*  HCT 40.6 42.0 32.9* 29.4* 28.0* 26.8*  PLT 254  --  193 213 240 276  MCV 88.3  --  90.6 88.3 88.3 89.0  MCH 29.1  --  29.2 29.1 29.3 28.9  MCHC 33.0  --  32.2 33.0 33.2 32.5  RDW 13.1  --  13.3 13.8 13.7 14.0  LYMPHSABS 0.3*  --   --  0.6* 1.0 1.5  MONOABS 0.8  --   --  0.5 0.7 0.9  EOSABS 0.0  --   --  0.2 0.3 0.3  BASOSABS 0.0  --   --  0.0 0.0 0.0    Recent Labs  Lab 05/03/24 1118 05/03/24 1203 05/03/24 1204 05/03/24 1402 05/04/24 0629 05/04/24 1540 05/05/24 0550 05/05/24 0819 05/06/24 0802 05/06/24 1133 05/07/24 0556  NA 135 137  --   --  134*  --  137  --  137  --  138  K 3.8 3.7  --   --  3.7  --  3.5  --  3.3*  --  3.6  CL 100 100  --   --  105  --  108  --   108  --  108  CO2 24  --   --   --  20*  --  20*  --  21*  --  23  ANIONGAP 11  --   --   --  9  --  9  --  8  --  7  GLUCOSE 119* 115*  --   --  116*  --  96  --  109*  --  98  BUN 11 11  --   --  16  --  12  --  11  --  7  CREATININE 0.98 1.00  --   --  1.18*  --  0.91  --  0.74  --  0.77  AST 43*  --   --   --   --   --  65*  --  28  --  19  ALT 40  --   --   --   --   --  92*  --  65*  --  50*  ALKPHOS 66  --   --   --   --   --  78  --  80  --  70  BILITOT 1.2  --   --   --   --   --  1.5*  --  1.0  --  0.7  ALBUMIN 3.6  --   --   --   --   --  2.5*  --  2.4*  --  2.4*  CRP  --   --   --   --   --   --  21.5*  --  19.5*  --  12.6*  PROCALCITON  --   --   --  1.97 2.24  --  1.57  --  0.87  --  0.49  LATICACIDVEN  --   --  1.2  --  1.7 1.4  --   --   --   --   --   INR 1.1  --   --   --   --   --   --  1.3*  --   --   --   TSH  --   --   --   --  2.461  --   --   --   --   --   --   AMMONIA  --   --   --   --   --   --   --   --   --  40*  --   BNP  --   --   --   --  99.1  --   --   --   --   --   --   MG  --   --   --   --  1.4*  --  2.5*  --  2.1  --  2.0  PHOS  --   --   --   --   --   --  2.9  --  2.8  --  3.5  CALCIUM  9.4  --   --   --  7.9*  --  8.1*  --  8.3*  --  8.6*      Recent Labs  Lab 05/03/24 1118 05/03/24 1204 05/03/24 1402 05/04/24 0629 05/04/24 1540 05/05/24 0550 05/05/24 0819 05/06/24 0802 05/06/24 1133 05/07/24 0556  CRP  --   --   --   --   --  21.5*  --  19.5*  --  12.6*  PROCALCITON  --   --  1.97 2.24  --  1.57  --  0.87  --  0.49  LATICACIDVEN  --  1.2  --  1.7 1.4  --   --   --   --   --   INR 1.1  --   --   --   --   --  1.3*  --   --   --   TSH  --   --   --  2.461  --   --   --   --   --   --  AMMONIA  --   --   --   --   --   --   --   --  40*  --   BNP  --   --   --  99.1  --   --   --   --   --   --   MG  --   --   --  1.4*  --  2.5*  --  2.1  --  2.0  CALCIUM  9.4  --   --  7.9*  --  8.1*  --  8.3*  --  8.6*     --------------------------------------------------------------------------------------------------------------- Lab Results  Component Value Date   CHOL 176 11/15/2023   HDL 50 11/15/2023   LDLCALC 114 (H) 11/15/2023   TRIG 62 11/15/2023   CHOLHDL 3.5 11/15/2023    Lab Results  Component Value Date   HGBA1C 5.1 11/15/2023   No results for input(s): TSH, T4TOTAL, FREET4, T3FREE, THYROIDAB in the last 72 hours.  No results for input(s): VITAMINB12, FOLATE, FERRITIN, TIBC, IRON, RETICCTPCT in the last 72 hours. ------------------------------------------------------------------------------------------------------------------ Cardiac Enzymes No results for input(s): CKMB, TROPONINI, MYOGLOBIN in the last 168 hours.  Invalid input(s): CK  Micro Results Recent Results (from the past 240 hours)  Blood Culture (routine x 2)     Status: None (Preliminary result)   Collection Time: 05/03/24 11:23 AM   Specimen: BLOOD  Result Value Ref Range Status   Specimen Description BLOOD SITE NOT SPECIFIED  Final   Special Requests   Final    BOTTLES DRAWN AEROBIC AND ANAEROBIC Blood Culture adequate volume   Culture   Final    NO GROWTH 4 DAYS Performed at [facility], [address], [place], [place] [address]    Report Status PENDING  Incomplete  Blood Culture (routine x 2)     Status: None (Preliminary result)   Collection Time: 05/03/24 11:43 AM   Specimen: BLOOD LEFT ARM  Result Value Ref Range Status   Specimen Description BLOOD LEFT ARM  Final   Special Requests   Final    BOTTLES DRAWN AEROBIC AND ANAEROBIC Blood Culture adequate volume   Culture   Final    NO GROWTH 4 DAYS Performed at [facility], [address], [place], [place] [address]    Report Status PENDING  Incomplete  Resp panel by RT-PCR (RSV, Flu A&B, Covid) Anterior Nasal Swab     Status: None   Collection Time: 05/03/24  1:11 PM   Specimen: Anterior Nasal Swab   Result Value Ref Range Status   SARS Coronavirus 2 by RT PCR NEGATIVE NEGATIVE Final   Influenza A by PCR NEGATIVE NEGATIVE Final   Influenza B by PCR NEGATIVE NEGATIVE Final    Comment: (NOTE) The Xpert Xpress SARS-CoV-2/FLU/RSV plus assay is intended as an aid in the diagnosis of influenza from Nasopharyngeal swab specimens and should not be used as a sole basis for treatment. Nasal washings and aspirates are unacceptable for Xpert Xpress SARS-CoV-2/FLU/RSV testing.  Fact Sheet for Patients: BloggerCourse.com  Fact Sheet for Healthcare Providers: SeriousBroker.it  This test is not yet approved or cleared by the [place]  FDA and has been authorized for detection and/or diagnosis of SARS-CoV-2 by FDA under an Emergency Use Authorization (EUA). This EUA will remain in effect (meaning this test can be used) for the duration of the COVID-19 declaration under Section 564(b)(1) of the Act, 21 U.S.C. section 360bbb-3(b)(1), unless the authorization is terminated  or revoked.     Resp Syncytial Virus by PCR NEGATIVE NEGATIVE Final    Comment: (NOTE) Fact Sheet for Patients: BloggerCourse.com  Fact Sheet for Healthcare Providers: SeriousBroker.it  This test is not yet approved or cleared by the [place]  FDA and has been authorized for detection and/or diagnosis of SARS-CoV-2 by FDA under an Emergency Use Authorization (EUA). This EUA will remain in effect (meaning this test can be used) for the duration of the COVID-19 declaration under Section 564(b)(1) of the Act, 21 U.S.C. section 360bbb-3(b)(1), unless the authorization is terminated or revoked.  Performed at [facility], [address], [place], [place] [address]   MRSA Next Gen by PCR, Nasal     Status: None   Collection Time: 05/03/24 10:20 PM   Specimen: Nasal Mucosa; Nasal Swab  Result Value Ref Range  Status   MRSA by PCR Next Gen NOT DETECTED NOT DETECTED Final    Comment: (NOTE) The GeneXpert MRSA Assay (FDA approved for NASAL specimens only), is one component of a comprehensive MRSA colonization surveillance program. It is not intended to diagnose MRSA infection nor to guide or monitor treatment for MRSA infections. Test performance is not FDA approved in patients less than 52 years old. Performed at [facility], [address] [place], [place] [address]   Respiratory (~20 pathogens) panel by PCR     Status: None   Collection Time: 05/04/24  6:20 AM   Specimen: Nasopharyngeal Swab; Respiratory  Result Value Ref Range Status   Adenovirus NOT DETECTED NOT DETECTED Final   Coronavirus 229E NOT DETECTED NOT DETECTED Final    Comment: (NOTE) The Coronavirus on the Respiratory Panel, DOES NOT test for the novel  Coronavirus (2019 nCoV)    Coronavirus HKU1 NOT DETECTED NOT DETECTED Final   Coronavirus NL63 NOT DETECTED NOT DETECTED Final   Coronavirus OC43 NOT DETECTED NOT DETECTED Final   Metapneumovirus NOT DETECTED NOT DETECTED Final   Rhinovirus / Enterovirus NOT DETECTED NOT DETECTED Final   Influenza A NOT DETECTED NOT DETECTED Final   Influenza B NOT DETECTED NOT DETECTED Final   Parainfluenza Virus 1 NOT DETECTED NOT DETECTED Final   Parainfluenza Virus 2 NOT DETECTED NOT DETECTED Final   Parainfluenza Virus 3 NOT DETECTED NOT DETECTED Final   Parainfluenza Virus 4 NOT DETECTED NOT DETECTED Final   Respiratory Syncytial Virus NOT DETECTED NOT DETECTED Final   Bordetella pertussis NOT DETECTED NOT DETECTED Final   Bordetella Parapertussis NOT DETECTED NOT DETECTED Final   Chlamydophila pneumoniae NOT DETECTED NOT DETECTED Final   Mycoplasma pneumoniae NOT DETECTED NOT DETECTED Final    Comment: Performed at [facility], [address] [place], [place] [address]    Radiology Reports  EEG adult Result Date: 05/06/2024 Shelton Arlin KIDD, MD     05/06/2024  10:13 AM Patient Name: Allison Moore MRN: [id number] Epilepsy Attending: Arlin KIDD Shelton Referring Physician/Provider: Dennise Lavada POUR, MD Date: 05/06/2024 Duration: 23.05 mins Patient history: 43yo F with ams. EEG to evaluate for seizure Level of alertness: Awake AEDs during EEG study: None Technical aspects: This EEG study was done with scalp electrodes positioned according to the 10-20 International system of electrode placement. Electrical activity was reviewed with band pass filter of 1-70Hz , sensitivity of 7 uV/mm, display speed of 52mm/sec with a 60Hz  notched filter applied as appropriate. EEG data were recorded continuously and digitally stored.  Video monitoring was available and reviewed as appropriate. Description: The posterior dominant rhythm  consists of 8-9 Hz activity of moderate voltage (25-35 uV) seen predominantly in posterior head regions, symmetric and reactive to eye opening and eye closing. Physiologic photic driving was not seen during photic stimulation. Hyperventilation was not performed.   IMPRESSION: This study is within normal limits. No seizures or epileptiform discharges were seen throughout the recording. A normal interictal EEG does not exclude the diagnosis of epilepsy. Arlin MALVA Krebs   MR THORACIC SPINE W WO CONTRAST Result Date: 05/05/2024 CLINICAL DATA:  Myelopathy, chronic, thoracic spine History of MS-frequent falls-sepsis-rule out flare. EXAM: MRI THORACIC WITHOUT AND WITH CONTRAST TECHNIQUE: Multiplanar and multiecho pulse sequences of the thoracic spine were obtained without and with intravenous contrast. CONTRAST:  10mL GADAVIST  GADOBUTROL  1 MMOL/ML IV SOLN COMPARISON:  MRI of the thoracic spine dated November 17, 2023. FINDINGS: Alignment:  Normal. Vertebrae: Normal. Cord: Normal in morphology and signal intensity. No abnormal enhancement of the spinal cord or the nerve roots. Paraspinal and other soft tissues: Negative. Disc levels: The disc spaces are well  preserved throughout the thoracic spine. The spinal canal and neural foramina are widely patent. IMPRESSION: Normal. Electronically Signed   By: Evalene Coho M.D.   On: 05/05/2024 13:36   MR CERVICAL SPINE W WO CONTRAST Result Date: 05/05/2024 CLINICAL DATA:  Myelopathy, chronic, cervical spine History of MS-frequent falls-sepsis-rule out flare. EXAM: MRI CERVICAL SPINE WITHOUT AND WITH CONTRAST TECHNIQUE: Multiplanar and multiecho pulse sequences of the cervical spine, to include the craniocervical junction and cervicothoracic junction, were obtained without and with intravenous contrast. CONTRAST:  10mL GADAVIST  GADOBUTROL  1 MMOL/ML IV SOLN COMPARISON:  MRI of the cervical spine dated November 18, 2023. FINDINGS: Alignment: Straightening of the normal cervical lordosis. Vertebrae: Intact and heterogeneous in signal intensity. No osseous lesions present. Cord: New focal area of increased T2 signal within the spinal cord at the C2 vertebral body level on the T2 and STIR sequences. There also appears to be a new focal area of increased T2 signal in the right hemi cord at C4, which is evident on image 33 of series 9 and image 9 of series 2. There is no appreciable a contrast enhancement of either lesion. The spinal cord is otherwise normal in signal intensity. Posterior Fossa, vertebral arteries, paraspinal tissues: Negative. Disc levels: There is chronic degenerative disc disease at C5-6 and C6-7, with mild posterior disc bulging and endplate ridging causing mild central spinal canal stenosis at both levels. There is no spinal cord or nerve root impingement. The neural foramina are patent. IMPRESSION: 1. New subtle areas of increased T2 signal within the spinal cord at C2 and C4, compatible with demyelinating plaques. Electronically Signed   By: Evalene Coho M.D.   On: 05/05/2024 13:32      Signature  -   Lavada Stank M.D on 05/07/2024 at 8:18 AM   -  To page go to [email or web address]

## 2024-05-08 DIAGNOSIS — A419 Sepsis, unspecified organism: Secondary | ICD-10-CM | POA: Diagnosis not present

## 2024-05-08 DIAGNOSIS — J189 Pneumonia, unspecified organism: Secondary | ICD-10-CM | POA: Diagnosis not present

## 2024-05-08 LAB — COMPREHENSIVE METABOLIC PANEL WITH GFR
ALT: 46 U/L — ABNORMAL HIGH (ref 0–44)
AST: 23 U/L (ref 15–41)
Albumin: 2.5 g/dL — ABNORMAL LOW (ref 3.5–5.0)
Alkaline Phosphatase: 72 U/L (ref 38–126)
Anion gap: 10 (ref 5–15)
BUN: 7 mg/dL (ref 6–20)
CO2: 26 mmol/L (ref 22–32)
Calcium: 8.9 mg/dL (ref 8.9–10.3)
Chloride: 103 mmol/L (ref 98–111)
Creatinine, Ser: 0.81 mg/dL (ref 0.44–1.00)
GFR, Estimated: >60 mL/min (ref >60)
Glucose, Bld: 126 mg/dL — ABNORMAL HIGH (ref 70–99)
Potassium: 3.4 mmol/L — ABNORMAL LOW (ref 3.5–5.1)
Sodium: 139 mmol/L (ref 135–145)
Total Bilirubin: 0.6 mg/dL (ref 0.0–1.2)
Total Protein: 6.3 g/dL — ABNORMAL LOW (ref 6.5–8.1)

## 2024-05-08 LAB — CBC WITH DIFFERENTIAL/PLATELET
Abs Immature Granulocytes: 0.15 K/uL — ABNORMAL HIGH (ref 0.00–0.07)
Basophils Absolute: 0.1 K/uL (ref 0.0–0.1)
Basophils Relative: 1 %
Eosinophils Absolute: 0.3 K/uL (ref 0.0–0.5)
Eosinophils Relative: 3 %
HCT: 29.5 % — ABNORMAL LOW (ref 36.0–46.0)
Hemoglobin: 9.6 g/dL — ABNORMAL LOW (ref 12.0–15.0)
Immature Granulocytes: 2 %
Lymphocytes Relative: 20 %
Lymphs Abs: 1.8 K/uL (ref 0.7–4.0)
MCH: 29.1 pg (ref 26.0–34.0)
MCHC: 32.5 g/dL (ref 30.0–36.0)
MCV: 89.4 fL (ref 80.0–100.0)
Monocytes Absolute: 0.9 K/uL (ref 0.1–1.0)
Monocytes Relative: 10 %
Neutro Abs: 6 K/uL (ref 1.7–7.7)
Neutrophils Relative %: 64 %
Platelets: 339 K/uL (ref 150–400)
RBC: 3.3 MIL/uL — ABNORMAL LOW (ref 3.87–5.11)
RDW: 13.5 % (ref 11.5–15.5)
WBC: 9.2 K/uL (ref 4.0–10.5)
nRBC: 0 % (ref 0.0–0.2)

## 2024-05-08 LAB — CULTURE, BLOOD (ROUTINE X 2)
Culture: NO GROWTH
Culture: NO GROWTH
Special Requests: ADEQUATE
Special Requests: ADEQUATE

## 2024-05-08 LAB — C-REACTIVE PROTEIN: CRP: 8.9 mg/dL — ABNORMAL HIGH (ref <1.0)

## 2024-05-08 LAB — PROCALCITONIN: Procalcitonin: 0.31 ng/mL

## 2024-05-08 LAB — MAGNESIUM: Magnesium: 1.7 mg/dL (ref 1.7–2.4)

## 2024-05-08 LAB — PHOSPHORUS: Phosphorus: 3.5 mg/dL (ref 2.5–4.6)

## 2024-05-08 MED ORDER — PANTOPRAZOLE SODIUM 40 MG PO TBEC
40.0000 mg | DELAYED_RELEASE_TABLET | Freq: Two times a day (BID) | ORAL | Status: DC
  Administered 2024-05-08 – 2024-05-11 (×6): 40 mg via ORAL
  Filled 2024-05-08 (×6): qty 1

## 2024-05-08 MED ORDER — POTASSIUM CHLORIDE 20 MEQ PO PACK
40.0000 meq | PACK | Freq: Once | ORAL | Status: AC
  Administered 2024-05-08: 40 meq via ORAL
  Filled 2024-05-08: qty 2

## 2024-05-08 NOTE — Progress Notes (Signed)
 Pt performed NIF with good pt effort. NIF -20.

## 2024-05-08 NOTE — Progress Notes (Signed)
 Pt achieved NIF -18 with great effort.

## 2024-05-08 NOTE — Progress Notes (Addendum)
 PROGRESS NOTE        PATIENT DETAILS Name: Allison Moore Age: 43 y.o. Sex: female Date of Birth: 23-Mar-1981 Admit Date: 05/03/2024 Admitting Physician Maximino DELENA Sharps, MD ERE:[email or web address], Rosina [name], PA  Brief Summary: Patient is a 43 y.o.  female with history of MS (walker at baseline)-who was on Kesimpta injections in May 2025 (?3 doses so far)-who presented to the ED for 4 to 5-day history of subjective fever, nasal congestion, shortness of breath, left ear watery discharge, odynophagia and frequent falls.  She was found to have pneumonia-with sepsis physiology and subsequently admitted to the hospitalist service.  Significant events: 7/10>> admit to [facility]-sepsis-frequent falls-PNA-URI symptoms-left ear discharge.  Significant studies: 7/10>> CT head: No acute intracranial abnormality 7/11>> CXR: Left basilar opacity.  Significant microbiology data: 7/10>> COVID/influenza/RSV PCR: Negative 7/10>> blood cultures: Pending 7/11>> respiratory virus panel: Negative  Procedures: None  Consults: ENT ID GI PCCM  Subjective:  Patient in bed, appears comfortable, denies any headache, no fever, no chest pain or pressure, no shortness of breath , no abdominal pain. No new focal weakness.   Objective: Vitals: Blood pressure 105/61, pulse (!) 110, temperature 98.7 F (37.1 C), temperature source Oral, resp. rate 14, height 5' 4 (1.626 m), weight 101 kg, SpO2 95%.   Exam:  Awake, more alert morning of 05/07/2024, No new F.N deficits, Normal affect [place].AT,PERRAL Supple Neck, No JVD,   Symmetrical Chest wall movement, few bibasilar crackles RRR,No Gallops, Rubs or new Murmurs,  +ve B.Sounds, Abd Soft, No tenderness,   No Cyanosis, Clubbing or edema    Assessment/Plan:  Sepsis-with left otitis externa, acute on chronic sinusitis, possible aspiration pneumonia, possible esophagitis in a patient who is immunocompromised due to being on ofatumumab injections  for multiple sclerosis  He has been seen by ENT Dr. Roark, PCCM Dr. Shelah, GI Dr. Marinda and ID Dr. Lindia, case discussed with ENT and PCCM, currently on empiric IV Zosyn , IV Diflucan  and IV acyclovir , Cipro  eardrops to the left ear ordered after discussions with ENT, clinically she has shown improvement on 05/05/2024.  Sepsis pathophysiology is resolving, speech therapy also following closely as aspiration of oral secretions is high on the list.  Speech following currently on a dysphagia 2 diet, also seen by GI this admission, now wait and watch.  Ongoing dysphagia and some odynophagia.  Currently n.p.o., speech GI on board.  On fluconazole  for high risk of Candida esophagitis due to being immunocompromised, defer further GI workup to gastroenterology team  History of MS with multiple falls Discussed with neurology-no headache, no meningismus, no photophobia, MRI brain nonacute, MRI C and T-spine ordered with potential new lesions in the C-spine, neurology is following.  Waxing and waning mental status on 05/06/2024.  Metabolic encephalopathy, able EEG and VBG, neuro and ID along with PCCM following.  Mentation has improved on 05/07/2024.   Hypomagnesemia.  Replaced.    HLD Statin  Schizoaffective disorder Anxiety/depression Anxiety slightly worse due to acute illness Continue Zoloft /risperidone   Class 2 Obesity: Estimated body mass index is 38.22 kg/m as calculated from the following:   Height as of this encounter: 5' 4 (1.626 m).   Weight as of this encounter: 101 kg.   Code status:   Code Status: Full Code   DVT Prophylaxis: enoxaparin  (LOVENOX ) injection 40 mg Start: 05/06/24 1000 SCDs Start: 05/03/24 1357    Family  Communication: Father-Lester Bickley-[phone] updated site 05/05/2024   Disposition Plan: Status is: Inpatient Remains inpatient appropriate because: Severity of illness   Planned Discharge Destination:Home health versus SNF   Diet: Diet Order              DIET DYS 2 Fluid consistency: Thin  Diet effective now                     Data Review:   Inpatient Medications  Scheduled Meds:  acyclovir   400 mg Oral 5 X Daily   atorvastatin   20 mg Oral QHS   ciprofloxacin -dexamethasone   4 drop Left EAR BID   enoxaparin  (LOVENOX ) injection  40 mg Subcutaneous Daily   fluconazole   400 mg Oral Daily   gabapentin   300 mg Oral TID   guaiFENesin   600 mg Oral BID   magic mouthwash w/lidocaine   15 mL Oral TID AC & HS   midodrine   10 mg Oral TID WC   multivitamin with minerals  1 tablet Oral Daily   pantoprazole  (PROTONIX ) IV  40 mg Intravenous Q12H   potassium chloride   40 mEq Oral Once   risperiDONE   3 mg Oral QHS   sertraline   150 mg Oral Daily   Continuous Infusions:  piperacillin -tazobactam (ZOSYN )  IV 3.375 g (05/08/24 0604)   PRN Meds:.albuterol , hydrOXYzine , sodium chloride   DVT Prophylaxis  enoxaparin  (LOVENOX ) injection 40 mg Start: 05/06/24 1000 SCDs Start: 05/03/24 1357   Recent Labs  Lab 05/03/24 1118 05/03/24 1203 05/04/24 0629 05/05/24 0550 05/06/24 0802 05/07/24 0556 05/08/24 0551  WBC 17.4*  --  13.6* 13.6* 10.5 8.6 9.2  HGB 13.4   < > 10.6* 9.7* 9.3* 8.7* 9.6*  HCT 40.6   < > 32.9* 29.4* 28.0* 26.8* 29.5*  PLT 254  --  193 213 240 276 339  MCV 88.3  --  90.6 88.3 88.3 89.0 89.4  MCH 29.1  --  29.2 29.1 29.3 28.9 29.1  MCHC 33.0  --  32.2 33.0 33.2 32.5 32.5  RDW 13.1  --  13.3 13.8 13.7 14.0 13.5  LYMPHSABS 0.3*  --   --  0.6* 1.0 1.5 1.8  MONOABS 0.8  --   --  0.5 0.7 0.9 0.9  EOSABS 0.0  --   --  0.2 0.3 0.3 0.3  BASOSABS 0.0  --   --  0.0 0.0 0.0 0.1   < > = values in this interval not displayed.    Recent Labs  Lab 05/03/24 1118 05/03/24 1203 05/03/24 1204 05/03/24 1402 05/04/24 0629 05/04/24 1540 05/05/24 0550 05/05/24 0819 05/06/24 0802 05/06/24 1133 05/07/24 0556 05/08/24 0551  NA 135   < >  --   --  134*  --  137  --  137  --  138 139  K 3.8   < >  --   --  3.7  --  3.5  --   3.3*  --  3.6 3.4*  CL 100   < >  --   --  105  --  108  --  108  --  108 103  CO2 24  --   --   --  20*  --  20*  --  21*  --  23 26  ANIONGAP 11  --   --   --  9  --  9  --  8  --  7 10  GLUCOSE 119*   < >  --   --  116*  --  96  --  109*  --  98 126*  BUN 11   < >  --   --  16  --  12  --  11  --  7 7  CREATININE 0.98   < >  --   --  1.18*  --  0.91  --  0.74  --  0.77 0.81  AST 43*  --   --   --   --   --  65*  --  28  --  19 23  ALT 40  --   --   --   --   --  92*  --  65*  --  50* 46*  ALKPHOS 66  --   --   --   --   --  78  --  80  --  70 72  BILITOT 1.2  --   --   --   --   --  1.5*  --  1.0  --  0.7 0.6  ALBUMIN 3.6  --   --   --   --   --  2.5*  --  2.4*  --  2.4* 2.5*  CRP  --   --   --   --   --   --  21.5*  --  19.5*  --  12.6* 8.9*  PROCALCITON  --   --   --  1.97 2.24  --  1.57  --  0.87  --  0.49  --   LATICACIDVEN  --   --  1.2  --  1.7 1.4  --   --   --   --   --   --   INR 1.1  --   --   --   --   --   --  1.3*  --   --   --   --   TSH  --   --   --   --  2.461  --   --   --   --   --   --   --   AMMONIA  --   --   --   --   --   --   --   --   --  40*  --   --   BNP  --   --   --   --  99.1  --   --   --   --   --   --   --   MG  --   --   --   --  1.4*  --  2.5*  --  2.1  --  2.0 1.7  PHOS  --   --   --   --   --   --  2.9  --  2.8  --  3.5 3.5  CALCIUM  9.4  --   --   --  7.9*  --  8.1*  --  8.3*  --  8.6* 8.9   < > = values in this interval not displayed.      Recent Labs  Lab 05/03/24 1118 05/03/24 1204 05/03/24 1402 05/04/24 0629 05/04/24 1540 05/05/24 0550 05/05/24 0819 05/06/24 0802 05/06/24 1133 05/07/24 0556 05/08/24 0551  CRP  --   --   --   --   --  21.5*  --  19.5*  --  12.6* 8.9*  PROCALCITON  --   --  1.97 2.24  --  1.57  --  0.87  --  0.49  --   LATICACIDVEN  --  1.2  --  1.7 1.4  --   --   --   --   --   --   INR 1.1  --   --   --   --   --  1.3*  --   --   --   --   TSH  --   --   --  2.461  --   --   --   --   --   --   --   AMMONIA  --    --   --   --   --   --   --   --  40*  --   --   BNP  --   --   --  99.1  --   --   --   --   --   --   --   MG  --   --   --  1.4*  --  2.5*  --  2.1  --  2.0 1.7  CALCIUM  9.4  --   --  7.9*  --  8.1*  --  8.3*  --  8.6* 8.9    --------------------------------------------------------------------------------------------------------------- Lab Results  Component Value Date   CHOL 176 11/15/2023   HDL 50 11/15/2023   LDLCALC 114 (H) 11/15/2023   TRIG 62 11/15/2023   CHOLHDL 3.5 11/15/2023    Lab Results  Component Value Date   HGBA1C 5.1 11/15/2023   No results for input(s): TSH, T4TOTAL, FREET4, T3FREE, THYROIDAB in the last 72 hours.  No results for input(s): VITAMINB12, FOLATE, FERRITIN, TIBC, IRON, RETICCTPCT in the last 72 hours. ------------------------------------------------------------------------------------------------------------------ Cardiac Enzymes No results for input(s): CKMB, TROPONINI, MYOGLOBIN in the last 168 hours.  Invalid input(s): CK  Micro Results Recent Results (from the past 240 hours)  Blood Culture (routine x 2)     Status: None   Collection Time: 05/03/24 11:23 AM   Specimen: BLOOD  Result Value Ref Range Status   Specimen Description BLOOD SITE NOT SPECIFIED  Final   Special Requests   Final    BOTTLES DRAWN AEROBIC AND ANAEROBIC Blood Culture adequate volume   Culture   Final    NO GROWTH 5 DAYS Performed at [facility], [address], [place], [place] [address]    Report Status 05/08/2024 FINAL  Final  Blood Culture (routine x 2)     Status: None   Collection Time: 05/03/24 11:43 AM   Specimen: BLOOD LEFT ARM  Result Value Ref Range Status   Specimen Description BLOOD LEFT ARM  Final   Special Requests   Final    BOTTLES DRAWN AEROBIC AND ANAEROBIC Blood Culture adequate volume   Culture   Final    NO GROWTH 5 DAYS Performed at [facility], [address], [place], [place] [address]     Report Status 05/08/2024 FINAL  Final  Resp panel by RT-PCR (RSV, Flu A&B, Covid) Anterior Nasal Swab     Status: None   Collection Time: 05/03/24  1:11 PM   Specimen: Anterior Nasal Swab  Result Value Ref Range Status   SARS Coronavirus 2 by RT PCR NEGATIVE NEGATIVE Final   Influenza A by PCR NEGATIVE NEGATIVE Final   Influenza B by PCR NEGATIVE NEGATIVE Final    Comment: (NOTE) The Xpert Xpress SARS-CoV-2/FLU/RSV plus assay is intended as  an aid in the diagnosis of influenza from Nasopharyngeal swab specimens and should not be used as a sole basis for treatment. Nasal washings and aspirates are unacceptable for Xpert Xpress SARS-CoV-2/FLU/RSV testing.  Fact Sheet for Patients: BloggerCourse.com  Fact Sheet for Healthcare Providers: SeriousBroker.it  This test is not yet approved or cleared by the [place]  FDA and has been authorized for detection and/or diagnosis of SARS-CoV-2 by FDA under an Emergency Use Authorization (EUA). This EUA will remain in effect (meaning this test can be used) for the duration of the COVID-19 declaration under Section 564(b)(1) of the Act, 21 U.S.C. section 360bbb-3(b)(1), unless the authorization is terminated or revoked.     Resp Syncytial Virus by PCR NEGATIVE NEGATIVE Final    Comment: (NOTE) Fact Sheet for Patients: BloggerCourse.com  Fact Sheet for Healthcare Providers: SeriousBroker.it  This test is not yet approved or cleared by the [place]  FDA and has been authorized for detection and/or diagnosis of SARS-CoV-2 by FDA under an Emergency Use Authorization (EUA). This EUA will remain in effect (meaning this test can be used) for the duration of the COVID-19 declaration under Section 564(b)(1) of the Act, 21 U.S.C. section 360bbb-3(b)(1), unless the authorization is terminated or revoked.  Performed at [facility],  [address] [place], [place] [address]   MRSA Next Gen by PCR, Nasal     Status: None   Collection Time: 05/03/24 10:20 PM   Specimen: Nasal Mucosa; Nasal Swab  Result Value Ref Range Status   MRSA by PCR Next Gen NOT DETECTED NOT DETECTED Final    Comment: (NOTE) The GeneXpert MRSA Assay (FDA approved for NASAL specimens only), is one component of a comprehensive MRSA colonization surveillance program. It is not intended to diagnose MRSA infection nor to guide or monitor treatment for MRSA infections. Test performance is not FDA approved in patients less than 37 years old. Performed at [facility], [address] [place], [place] [address]   Respiratory (~20 pathogens) panel by PCR     Status: None   Collection Time: 05/04/24  6:20 AM   Specimen: Nasopharyngeal Swab; Respiratory  Result Value Ref Range Status   Adenovirus NOT DETECTED NOT DETECTED Final   Coronavirus 229E NOT DETECTED NOT DETECTED Final    Comment: (NOTE) The Coronavirus on the Respiratory Panel, DOES NOT test for the novel  Coronavirus (2019 nCoV)    Coronavirus HKU1 NOT DETECTED NOT DETECTED Final   Coronavirus NL63 NOT DETECTED NOT DETECTED Final   Coronavirus OC43 NOT DETECTED NOT DETECTED Final   Metapneumovirus NOT DETECTED NOT DETECTED Final   Rhinovirus / Enterovirus NOT DETECTED NOT DETECTED Final   Influenza A NOT DETECTED NOT DETECTED Final   Influenza B NOT DETECTED NOT DETECTED Final   Parainfluenza Virus 1 NOT DETECTED NOT DETECTED Final   Parainfluenza Virus 2 NOT DETECTED NOT DETECTED Final   Parainfluenza Virus 3 NOT DETECTED NOT DETECTED Final   Parainfluenza Virus 4 NOT DETECTED NOT DETECTED Final   Respiratory Syncytial Virus NOT DETECTED NOT DETECTED Final   Bordetella pertussis NOT DETECTED NOT DETECTED Final   Bordetella Parapertussis NOT DETECTED NOT DETECTED Final   Chlamydophila pneumoniae NOT DETECTED NOT DETECTED Final   Mycoplasma pneumoniae NOT DETECTED NOT DETECTED  Final    Comment: Performed at [facility], [address], [place], [place] [address]    Radiology Reports  EEG adult Result Date: 05/06/2024 Shelton Arlin KIDD, MD     05/06/2024  10:13 AM Patient Name: Lunell Robart MRN: [id number] Epilepsy Attending: Arlin MALVA Krebs Referring Physician/Provider: Dennise Lavada POUR, MD Date: 05/06/2024 Duration: 23.05 mins Patient history: 43yo F with ams. EEG to evaluate for seizure Level of alertness: Awake AEDs during EEG study: None Technical aspects: This EEG study was done with scalp electrodes positioned according to the 10-20 International system of electrode placement. Electrical activity was reviewed with band pass filter of 1-70Hz , sensitivity of 7 uV/mm, display speed of 41mm/sec with a 60Hz  notched filter applied as appropriate. EEG data were recorded continuously and digitally stored.  Video monitoring was available and reviewed as appropriate. Description: The posterior dominant rhythm consists of 8-9 Hz activity of moderate voltage (25-35 uV) seen predominantly in posterior head regions, symmetric and reactive to eye opening and eye closing. Physiologic photic driving was not seen during photic stimulation. Hyperventilation was not performed.   IMPRESSION: This study is within normal limits. No seizures or epileptiform discharges were seen throughout the recording. A normal interictal EEG does not exclude the diagnosis of epilepsy. Priyanka O Yadav      Signature  -   Lavada Dennise M.D on 05/08/2024 at 9:31 AM   -  To page go to [email or web address]

## 2024-05-08 NOTE — Progress Notes (Signed)
 Physical Therapy Treatment Patient Details Name: Allison Moore MRN: [id number] DOB: 10/28/1980 Today's Date: 05/08/2024   History of Present Illness Pt is 43 year old [facility] on [facility] for weakness and falls. Pt with sepsis with lt otitis externa, acute sinusitis, possible aspiration PNA and possible esophagitis. PMH - MS,anxiety, depression, obesity, tibial plateau fx    PT Comments  Pt seen for PT tx with pt agreeable. Pt is able to complete bed mobility with supervision with hospital bed features, sit<>stand from EOB, low toilet in bathroom with grab bar with cuing re: hand placement. Pt is able to ambulate with RW & CGA<>close supervision but impaired gait pattern as noted below. Pt toilets without assistance. Pt is making good progress with mobility, asking about bed rail for her bed at home.  At this time, will continue to recommend post acute rehab <3 hours therapy/day unless staff at group home can provide assistance at pt's CLOF, then will recommend HHPT services.   If plan is discharge home, recommend the following: A little help with walking and/or transfers;A little help with bathing/dressing/bathroom;Assistance with cooking/housework;Assist for transportation;Help with stairs or ramp for entrance   Can travel by private vehicle     Yes  Equipment Recommendations  Rolling walker (2 wheels);BSC/3in1    Recommendations for Other Services       Precautions / Restrictions Precautions Precautions: Fall Restrictions Weight Bearing Restrictions Per Provider Order: No     Mobility  Bed Mobility   Bed Mobility: Supine to Sit     Supine to sit: HOB elevated, Supervision, Used rails (exit L side of bed)          Transfers Overall transfer level: Needs assistance Equipment used: Rolling walker (2 wheels) Transfers: Sit to/from Stand Sit to Stand: Supervision           General transfer comment: cuing re: hand placement to push to stand with good return  demo, poor return demo of hand placement during stand>sit. Pt transferred sit<>stand from EOB & toilet in bathroom.    Ambulation/Gait Ambulation/Gait assistance: Contact guard assist, Supervision Gait Distance (Feet): 125 Feet Assistive device: Rolling walker (2 wheels) Gait Pattern/deviations: Decreased step length - right, Decreased step length - left, Decreased dorsiflexion - right, Decreased dorsiflexion - left, Decreased stride length, Decreased weight shift to left Gait velocity: decreased     General Gait Details: decreased BLE hip/knee flexion during swing phase. Pt ambulated in hallway & bathroom. Cuing to square up to sink when washing hands.   Stairs             Wheelchair Mobility     Tilt Bed    Modified Rankin (Stroke Patients Only)       Balance Overall balance assessment: Needs assistance Sitting-balance support: No upper extremity supported, Feet supported Sitting balance-Leahy Scale: Fair Sitting balance - Comments: peri hygiene sitting on toilet without LOB   Standing balance support: No upper extremity supported, During functional activity, Reliant on assistive device for balance Standing balance-Leahy Scale: Fair Standing balance comment: static standing for hand hygiene at sink                            Communication Communication Communication: Impaired Factors Affecting Communication: Reduced clarity of speech  Cognition Arousal: Alert Behavior During Therapy: Flat affect   PT - Cognitive impairments: Problem solving, Safety/Judgement  PT - Cognition Comments: decreased processing   Following commands impaired: Only follows one step commands consistently, Follows multi-step commands with increased time    Cueing Cueing Techniques: Verbal cues  Exercises      General Comments        Pertinent Vitals/Pain Pain Assessment Pain Assessment: No/denies pain    Home Living                           Prior Function            PT Goals (current goals can now be found in the care plan section) Acute Rehab PT Goals Patient Stated Goal: return home PT Goal Formulation: With patient Time For Goal Achievement: 05/19/24 Potential to Achieve Goals: Fair Progress towards PT goals: Progressing toward goals    Frequency    Min 2X/week      PT Plan      Co-evaluation              AM-PAC PT 6 Clicks Mobility   Outcome Measure                   End of Session   Activity Tolerance: Patient tolerated treatment well Patient left: in chair;with chair alarm set;with call bell/phone within reach Nurse Communication: Mobility status PT Visit Diagnosis: Unsteadiness on feet (R26.81);Other abnormalities of gait and mobility (R26.89);Repeated falls (R29.6);Muscle weakness (generalized) (M62.81)     Time: 8548-8485 PT Time Calculation (min) (ACUTE ONLY): 23 min  Charges:    $Therapeutic Activity: 23-37 mins PT General Charges $$ ACUTE PT VISIT: 1 Visit                     Richerd Pinal, PT, DPT 05/08/24, 3:33 PM    Richerd CHRISTELLA Pinal 05/08/2024, 3:31 PM

## 2024-05-08 NOTE — Plan of Care (Signed)
  Problem: Clinical Measurements: Goal: Ability to maintain clinical measurements within normal limits will improve Outcome: Progressing   Problem: Activity: Goal: Risk for activity intolerance will decrease Outcome: Progressing   Problem: Safety: Goal: Ability to remain free from injury will improve Outcome: Progressing   Problem: Skin Integrity: Goal: Risk for impaired skin integrity will decrease Outcome: Progressing   Problem: Activity: Goal: Ability to tolerate increased activity will improve Outcome: Progressing   Problem: Clinical Measurements: Goal: Ability to maintain a body temperature in the normal range will improve Outcome: Progressing   Problem: Respiratory: Goal: Ability to maintain adequate ventilation will improve Outcome: Progressing Goal: Ability to maintain a clear airway will improve Outcome: Progressing

## 2024-05-08 NOTE — Plan of Care (Signed)

## 2024-05-09 DIAGNOSIS — A419 Sepsis, unspecified organism: Secondary | ICD-10-CM | POA: Diagnosis not present

## 2024-05-09 DIAGNOSIS — J189 Pneumonia, unspecified organism: Secondary | ICD-10-CM | POA: Diagnosis not present

## 2024-05-09 LAB — COMPREHENSIVE METABOLIC PANEL WITH GFR
ALT: 46 U/L — ABNORMAL HIGH (ref 0–44)
AST: 27 U/L (ref 15–41)
Albumin: 2.6 g/dL — ABNORMAL LOW (ref 3.5–5.0)
Alkaline Phosphatase: 68 U/L (ref 38–126)
Anion gap: 12 (ref 5–15)
BUN: 10 mg/dL (ref 6–20)
CO2: 27 mmol/L (ref 22–32)
Calcium: 9.5 mg/dL (ref 8.9–10.3)
Chloride: 101 mmol/L (ref 98–111)
Creatinine, Ser: 0.86 mg/dL (ref 0.44–1.00)
GFR, Estimated: >60 mL/min (ref >60)
Glucose, Bld: 93 mg/dL (ref 70–99)
Potassium: 4.1 mmol/L (ref 3.5–5.1)
Sodium: 140 mmol/L (ref 135–145)
Total Bilirubin: 0.5 mg/dL (ref 0.0–1.2)
Total Protein: 6.7 g/dL (ref 6.5–8.1)

## 2024-05-09 LAB — CBC WITH DIFFERENTIAL/PLATELET
Abs Immature Granulocytes: 0.35 K/uL — ABNORMAL HIGH (ref 0.00–0.07)
Basophils Absolute: 0.1 K/uL (ref 0.0–0.1)
Basophils Relative: 1 %
Eosinophils Absolute: 0.4 K/uL (ref 0.0–0.5)
Eosinophils Relative: 4 %
HCT: 31.9 % — ABNORMAL LOW (ref 36.0–46.0)
Hemoglobin: 10.5 g/dL — ABNORMAL LOW (ref 12.0–15.0)
Immature Granulocytes: 4 %
Lymphocytes Relative: 24 %
Lymphs Abs: 2.2 K/uL (ref 0.7–4.0)
MCH: 29.4 pg (ref 26.0–34.0)
MCHC: 32.9 g/dL (ref 30.0–36.0)
MCV: 89.4 fL (ref 80.0–100.0)
Monocytes Absolute: 0.9 K/uL (ref 0.1–1.0)
Monocytes Relative: 9 %
Neutro Abs: 5.4 K/uL (ref 1.7–7.7)
Neutrophils Relative %: 58 %
Platelets: 429 K/uL — ABNORMAL HIGH (ref 150–400)
RBC: 3.57 MIL/uL — ABNORMAL LOW (ref 3.87–5.11)
RDW: 13.4 % (ref 11.5–15.5)
WBC: 9.3 K/uL (ref 4.0–10.5)
nRBC: 0 % (ref 0.0–0.2)

## 2024-05-09 LAB — PROCALCITONIN: Procalcitonin: 0.2 ng/mL

## 2024-05-09 NOTE — Progress Notes (Signed)
 Occupational Therapy Treatment Patient Details Name: Allison Moore MRN: [id number] DOB: 23-Jun-1981 Today's Date: 05/09/2024   History of present illness Pt is 43 year old presented to [facility] on 05/03/24 for weakness and falls. Pt with sepsis with lt otitis externa, acute sinusitis, possible aspiration PNA and possible esophagitis. PMH - MS,anxiety, depression, obesity, tibial plateau fx   OT comments  Pt greeted supine in bed, pt agreeable to OT intervention with a focus on improving functional endurance for higher level ADL tasks. Pt completed supine>sit MODI. Pt completed stand pivot transfer to recliner with RW and CGA. Pt completed below standing therex with CGA with fair tolerance although pt requested to be done after 2 standing exercises. Pt on RA with SpO2 WFL. Continue to feel patient will benefit from continued inpatient follow up therapy, <3 hours/day, however d/t pts progress will monitor closely for change in DC recs.        If plan is discharge home, recommend the following:  A lot of help with walking and/or transfers;A lot of help with bathing/dressing/bathroom;Assistance with cooking/housework;Direct supervision/assist for medications management   Equipment Recommendations  Other (comment) (defer)    Recommendations for Other Services      Precautions / Restrictions Precautions Precautions: Fall Restrictions Weight Bearing Restrictions Per Provider Order: No       Mobility Bed Mobility Overal bed mobility: Modified Independent Bed Mobility: Supine to Sit     Supine to sit: Modified independent (Device/Increase time)     General bed mobility comments: increased time and use of bed features    Transfers Overall transfer level: Needs assistance Equipment used: Rolling walker (2 wheels) Transfers: Sit to/from Stand, Bed to chair/wheelchair/BSC Sit to Stand: Supervision     Step pivot transfers: Contact guard assist     General transfer comment:  supervision to rise from EOB, cues needed for hand placement to stand from recliner during therex. CGA for safety to stand pivot to recliner with RW     Balance Overall balance assessment: Needs assistance Sitting-balance support: No upper extremity supported, Feet supported Sitting balance-Leahy Scale: Fair     Standing balance support: During functional activity, Reliant on assistive device for balance Standing balance-Leahy Scale: Poor Standing balance comment: reliant on BUE support for standing therex                           ADL either performed or assessed with clinical judgement   ADL Overall ADL's : Needs assistance/impaired             Lower Body Bathing: Contact guard assist;Sitting/lateral leans Lower Body Bathing Details (indicate cue type and reason): simulated via Lb dressing     Lower Body Dressing: Contact guard assist;Sitting/lateral leans Lower Body Dressing Details (indicate cue type and reason): to adjust socks from EOB Toilet Transfer: Contact guard assist;Stand-pivot Toilet Transfer Details (indicate cue type and reason): simulated via stand pivot to recliner         Functional mobility during ADLs: Contact guard assist;Rolling walker (2 wheels) General ADL Comments: ADL participation most limited by decreased endurance and activity tolerance    Extremity/Trunk Assessment Upper Extremity Assessment Upper Extremity Assessment: Generalized weakness   Lower Extremity Assessment Lower Extremity Assessment: Defer to PT evaluation   Cervical / Trunk Assessment Cervical / Trunk Assessment: Normal    Vision Baseline Vision/History: 1 Wears glasses Patient Visual Report: No change from baseline Vision Assessment?: No apparent visual deficits   Perception Perception Perception:  Within Functional Limits   Praxis Praxis Praxis: [facility]   Communication Communication Communication: No apparent difficulties   Cognition Arousal:  Alert Behavior During Therapy: Flat affect Cognition: Cognition impaired           Executive functioning impairment (select all impairments): Sequencing, Problem solving OT - Cognition Comments: mild deficits noted in above areas, likely baseline                 Following commands: Impaired Following commands impaired: Only follows one step commands consistently, Follows multi-step commands with increased time      Cueing   Cueing Techniques: Verbal cues  Exercises Other Exercises Other Exercises: x5 sit>stands from recliner Other Exercises: x15 reps of standing marches with BUE support from RW    Shoulder Instructions       General Comments VSS on RA    Pertinent Vitals/ Pain       Pain Assessment Pain Assessment: No/denies pain  Home Living                                          Prior Functioning/Environment              Frequency  Min 2X/week        Progress Toward Goals  OT Goals(current goals can now be found in the care plan section)  Progress towards OT goals: Progressing toward goals  Acute Rehab OT Goals Patient Stated Goal: to get stronger OT Goal Formulation: With patient Time For Goal Achievement: 05/20/24 Potential to Achieve Goals: Good  Plan      Co-evaluation                 AM-PAC OT 6 Clicks Daily Activity     Outcome Measure   Help from another person eating meals?: None Help from another person taking care of personal grooming?: A Little Help from another person toileting, which includes using toliet, bedpan, or urinal?: A Little Help from another person bathing (including washing, rinsing, drying)?: A Little Help from another person to put on and taking off regular upper body clothing?: None Help from another person to put on and taking off regular lower body clothing?: A Little 6 Click Score: 20    End of Session Equipment Utilized During Treatment: Gait belt;Rolling walker (2  wheels)  OT Visit Diagnosis: Other abnormalities of gait and mobility (R26.89);Unsteadiness on feet (R26.81);Muscle weakness (generalized) (M62.81)   Activity Tolerance Patient tolerated treatment well   Patient Left in chair;with call bell/phone within reach;with chair alarm set   Nurse Communication Mobility status;Other (comment) (needs new purewick)        Time: 8598-8582 OT Time Calculation (min): 16 min  Charges: OT General Charges $OT Visit: 1 Visit OT Treatments $Therapeutic Exercise: 8-22 mins  Ronal Mallie POUR., COTA/L Acute Rehabilitation Services [phone]   Ronal Mallie Needy 05/09/2024, 3:21 PM

## 2024-05-09 NOTE — Progress Notes (Signed)
 With fair effort patient did -21 NIF, 1.32 VC

## 2024-05-09 NOTE — TOC Progression Note (Addendum)
 Transition of Care [facility]) - Progression Note    Patient Details  Name: Allison Moore MRN: [id number] Date of Birth: 1981-09-10  Transition of Care [facility]) CM/SW Contact  Marval Gell, RN Phone Number: 05/09/2024, 12:07 PM  Clinical Narrative:     [name] w patient and mother in room, Discussed current level of function and walking 400 feet with mobility, both were encouraged by her progression with the mobility team. We discussed disposition. Patient continues to want to go home, mother now understanding that SNF would likely not be approved due to her advancement in ambulation.  Patient agreeable for ICM to try to find home health services, does not have preference of provider.  She sates that if [facility] cannot be found she will be able to call her OP PT at [facility] and get appointment. SHe states that she will set up rides through her insurance, she states she has 18 rides left this yr. She states that she has a Arts administrator- Accepted for [facility]  Expected Discharge Plan: Skilled Nursing Facility Barriers to Discharge: Continued Medical Work up, English as a second language teacher, SNF Pending bed offer  Expected Discharge Plan and Services In-house Referral: Clinical Social Work     Living arrangements for the past 2 months: [employer]                                       Social Determinants of Health (SDOH) Interventions SDOH Screenings   Food Insecurity: Food Insecurity Present (05/03/2024)  Housing: High Risk (05/03/2024)  Transportation Needs: Patient Unable To Answer (05/03/2024)  Utilities: Patient Declined (05/03/2024)  Alcohol Screen: Low Risk  (11/11/2023)  Depression (PHQ2-9): Low Risk  (09/22/2021)  Tobacco Use: High Risk (05/03/2024)    Readmission Risk Interventions     No data to display

## 2024-05-09 NOTE — Progress Notes (Signed)
 Mobility Specialist: Progress Note   05/09/24 0941  Mobility  Activity Ambulated with assistance in hallway  Level of Assistance Contact guard assist, steadying assist  Assistive Device Front wheel walker  Distance Ambulated (ft) 400 ft  Activity Response Tolerated well  Mobility Referral Yes  Mobility visit 1 Mobility  Mobility Specialist Start Time (ACUTE ONLY) 0913  Mobility Specialist Stop Time (ACUTE ONLY) 0932  Mobility Specialist Time Calculation (min) (ACUTE ONLY) 19 min    Pt received standing at sink, agreeable to mobility session. CGA throughout. Took 1x seated break d/t fatigue. Otherwise, no complaints. Returned to room without fault. Left in bed with all needs met, call bell in reach.   Ileana Lute Mobility Specialist Please contact via SecureChat or Rehab office at [phone]

## 2024-05-09 NOTE — Progress Notes (Signed)
 PROGRESS NOTE        PATIENT DETAILS Name: Allison Moore Age: 43 y.o. Sex: female Date of Birth: 12-25-1980 Admit Date: 05/03/2024 Admitting Physician Maximino DELENA Sharps, MD ERE:[email or web address], Rosina [name], PA  Brief Summary: Patient is a 43 y.o.  female with history of MS (walker at baseline)-who was on Kesimpta injections in May 2025 (?3 doses so far)-who presented to the ED for 4 to 5-day history of subjective fever, nasal congestion, shortness of breath, left ear watery discharge, odynophagia and frequent falls.  She was found to have pneumonia-with sepsis physiology and subsequently admitted to the hospitalist service.  Significant events: 7/10>> admit to [facility]-sepsis-frequent falls-PNA-URI symptoms-left ear discharge.  Significant studies: 7/10>> CT head: No acute intracranial abnormality 7/11>> CXR: Left basilar opacity.  Significant microbiology data: 7/10>> COVID/influenza/RSV PCR: Negative 7/10>> blood cultures: Pending 7/11>> respiratory virus panel: Negative  Procedures: None  Consults: ENT ID GI PCCM  Subjective:  Patient in bed, appears comfortable, denies any headache, no fever, no chest pain or pressure, no shortness of breath , no abdominal pain. No new focal weakness.   Objective: Vitals: Blood pressure 124/62, pulse 94, temperature 98.3 F (36.8 C), temperature source Oral, resp. rate 17, height 5' 4 (1.626 m), weight 101 kg, SpO2 96%.   Exam:  Awake, more alert morning of 05/07/2024, No new F.N deficits, Normal affect [place].AT,PERRAL Supple Neck, No JVD,   Symmetrical Chest wall movement, few bibasilar crackles RRR,No Gallops, Rubs or new Murmurs,  +ve B.Sounds, Abd Soft, No tenderness,   No Cyanosis, Clubbing or edema    Assessment/Plan:  Sepsis-with left otitis externa, acute on chronic sinusitis, possible aspiration pneumonia, possible esophagitis in a patient who is immunocompromised due to being on ofatumumab injections for  multiple sclerosis  He has been seen by ENT Dr. Roark, PCCM Dr. Shelah, GI Dr. Marinda and ID Dr. Lindia, case discussed with ENT and PCCM, currently on empiric IV Zosyn , PO  Diflucan  and acyclovir , Cipro  eardrops to the left ear ordered after discussions with EN.  Clinically she has shown improvement on 05/05/2024.  Sepsis pathophysiology is resolving, speech therapy also following closely as aspiration of oral secretions is high on the list.  Speech following currently on a dysphagia 2 diet, also seen by GI this admission, now wait and watch.  Ongoing dysphagia and some odynophagia.  Currently n.p.o., speech GI on board.  On fluconazole  for high risk of Candida esophagitis due to being immunocompromised, defer further GI workup to gastroenterology team  History of MS with multiple falls Discussed with neurology-no headache, no meningismus, no photophobia, MRI brain nonacute, MRI C and T-spine ordered with potential new lesions in the C-spine, neurology is following.  Waxing and waning mental status on 05/06/2024.  Metabolic encephalopathy, able EEG and VBG, neuro and ID along with PCCM following.  Mentation has improved on 05/07/2024.   Hypomagnesemia.  Replaced.    HLD Statin  Schizoaffective disorder Anxiety/depression Anxiety slightly worse due to acute illness Continue Zoloft /risperidone   Class 2 Obesity: Estimated body mass index is 38.22 kg/m as calculated from the following:   Height as of this encounter: 5' 4 (1.626 m).   Weight as of this encounter: 101 kg.   Code status:   Code Status: Full Code   DVT Prophylaxis: enoxaparin  (LOVENOX ) injection 40 mg Start: 05/06/24 1000 SCDs Start: 05/03/24 1357    Family  Communication: Father-Lester Ciolek-[phone] updated site 05/05/2024   Disposition Plan: Status is: Inpatient Remains inpatient appropriate because: Severity of illness   Planned Discharge Destination:Home health versus SNF   Diet: Diet Order              DIET DYS 2 Fluid consistency: Thin  Diet effective now                     Data Review:   Inpatient Medications  Scheduled Meds:  acyclovir   400 mg Oral 5 X Daily   atorvastatin   20 mg Oral QHS   ciprofloxacin -dexamethasone   4 drop Left EAR BID   enoxaparin  (LOVENOX ) injection  40 mg Subcutaneous Daily   fluconazole   400 mg Oral Daily   gabapentin   300 mg Oral TID   guaiFENesin   600 mg Oral BID   magic mouthwash w/lidocaine   15 mL Oral TID AC & HS   midodrine   10 mg Oral TID WC   multivitamin with minerals  1 tablet Oral Daily   pantoprazole   40 mg Oral BID   risperiDONE   3 mg Oral QHS   sertraline   150 mg Oral Daily   Continuous Infusions:  piperacillin -tazobactam (ZOSYN )  IV 3.375 g (05/09/24 0552)   PRN Meds:.albuterol , hydrOXYzine , sodium chloride   DVT Prophylaxis  enoxaparin  (LOVENOX ) injection 40 mg Start: 05/06/24 1000 SCDs Start: 05/03/24 1357   Recent Labs  Lab 05/05/24 0550 05/06/24 0802 05/07/24 0556 05/08/24 0551 05/09/24 0502  WBC 13.6* 10.5 8.6 9.2 9.3  HGB 9.7* 9.3* 8.7* 9.6* 10.5*  HCT 29.4* 28.0* 26.8* 29.5* 31.9*  PLT 213 240 276 339 429*  MCV 88.3 88.3 89.0 89.4 89.4  MCH 29.1 29.3 28.9 29.1 29.4  MCHC 33.0 33.2 32.5 32.5 32.9  RDW 13.8 13.7 14.0 13.5 13.4  LYMPHSABS 0.6* 1.0 1.5 1.8 2.2  MONOABS 0.5 0.7 0.9 0.9 0.9  EOSABS 0.2 0.3 0.3 0.3 0.4  BASOSABS 0.0 0.0 0.0 0.1 0.1    Recent Labs  Lab 05/03/24 1118 05/03/24 1203 05/03/24 1204 05/03/24 1402 05/04/24 0629 05/04/24 1540 05/05/24 0550 05/05/24 0819 05/06/24 0802 05/06/24 1133 05/07/24 0556 05/08/24 0551 05/09/24 0502  NA 135   < >  --   --  134*  --  137  --  137  --  138 139 140  K 3.8   < >  --   --  3.7  --  3.5  --  3.3*  --  3.6 3.4* 4.1  CL 100   < >  --   --  105  --  108  --  108  --  108 103 101  CO2 24  --   --   --  20*  --  20*  --  21*  --  23 26 27   ANIONGAP 11  --   --   --  9  --  9  --  8  --  7 10 12   GLUCOSE 119*   < >  --   --  116*  --  96   --  109*  --  98 126* 93  BUN 11   < >  --   --  16  --  12  --  11  --  7 7 10   CREATININE 0.98   < >  --   --  1.18*  --  0.91  --  0.74  --  0.77 0.81 0.86  AST 43*  --   --   --   --   --  65*  --  28  --  19 23 27   ALT 40  --   --   --   --   --  92*  --  65*  --  50* 46* 46*  ALKPHOS 66  --   --   --   --   --  78  --  80  --  70 72 68  BILITOT 1.2  --   --   --   --   --  1.5*  --  1.0  --  0.7 0.6 0.5  ALBUMIN 3.6  --   --   --   --   --  2.5*  --  2.4*  --  2.4* 2.5* 2.6*  CRP  --   --   --   --   --   --  21.5*  --  19.5*  --  12.6* 8.9*  --   PROCALCITON  --   --   --    < > 2.24  --  1.57  --  0.87  --  0.49 0.31 0.20  LATICACIDVEN  --   --  1.2  --  1.7 1.4  --   --   --   --   --   --   --   INR 1.1  --   --   --   --   --   --  1.3*  --   --   --   --   --   TSH  --   --   --   --  2.461  --   --   --   --   --   --   --   --   AMMONIA  --   --   --   --   --   --   --   --   --  40*  --   --   --   BNP  --   --   --   --  99.1  --   --   --   --   --   --   --   --   MG  --   --   --   --  1.4*  --  2.5*  --  2.1  --  2.0 1.7  --   PHOS  --   --   --   --   --   --  2.9  --  2.8  --  3.5 3.5  --   CALCIUM  9.4  --   --   --  7.9*  --  8.1*  --  8.3*  --  8.6* 8.9 9.5   < > = values in this interval not displayed.      Recent Labs  Lab 05/03/24 1118 05/03/24 1204 05/03/24 1402 05/04/24 0629 05/04/24 1540 05/05/24 0550 05/05/24 0819 05/06/24 0802 05/06/24 1133 05/07/24 0556 05/08/24 0551 05/09/24 0502  CRP  --   --   --   --   --  21.5*  --  19.5*  --  12.6* 8.9*  --   PROCALCITON  --   --    < > 2.24  --  1.57  --  0.87  --  0.49 0.31 0.20  LATICACIDVEN  --  1.2  --  1.7 1.4  --   --   --   --   --   --   --  INR 1.1  --   --   --   --   --  1.3*  --   --   --   --   --   TSH  --   --   --  2.461  --   --   --   --   --   --   --   --   AMMONIA  --   --   --   --   --   --   --   --  40*  --   --   --   BNP  --   --   --  99.1  --   --   --   --   --   --   --    --   MG  --   --   --  1.4*  --  2.5*  --  2.1  --  2.0 1.7  --   CALCIUM  9.4  --   --  7.9*  --  8.1*  --  8.3*  --  8.6* 8.9 9.5   < > = values in this interval not displayed.    --------------------------------------------------------------------------------------------------------------- Lab Results  Component Value Date   CHOL 176 11/15/2023   HDL 50 11/15/2023   LDLCALC 114 (H) 11/15/2023   TRIG 62 11/15/2023   CHOLHDL 3.5 11/15/2023    Lab Results  Component Value Date   HGBA1C 5.1 11/15/2023   No results for input(s): TSH, T4TOTAL, FREET4, T3FREE, THYROIDAB in the last 72 hours.  No results for input(s): VITAMINB12, FOLATE, FERRITIN, TIBC, IRON, RETICCTPCT in the last 72 hours. ------------------------------------------------------------------------------------------------------------------ Cardiac Enzymes No results for input(s): CKMB, TROPONINI, MYOGLOBIN in the last 168 hours.  Invalid input(s): CK  Micro Results Recent Results (from the past 240 hours)  Blood Culture (routine x 2)     Status: None   Collection Time: 05/03/24 11:23 AM   Specimen: BLOOD  Result Value Ref Range Status   Specimen Description BLOOD SITE NOT SPECIFIED  Final   Special Requests   Final    BOTTLES DRAWN AEROBIC AND ANAEROBIC Blood Culture adequate volume   Culture   Final    NO GROWTH 5 DAYS Performed at [facility], [address], [place], [place] [address]    Report Status 05/08/2024 FINAL  Final  Blood Culture (routine x 2)     Status: None   Collection Time: 05/03/24 11:43 AM   Specimen: BLOOD LEFT ARM  Result Value Ref Range Status   Specimen Description BLOOD LEFT ARM  Final   Special Requests   Final    BOTTLES DRAWN AEROBIC AND ANAEROBIC Blood Culture adequate volume   Culture   Final    NO GROWTH 5 DAYS Performed at [facility], [address], [place], [place] [address]    Report Status 05/08/2024 FINAL  Final  Resp  panel by RT-PCR (RSV, Flu A&B, Covid) Anterior Nasal Swab     Status: None   Collection Time: 05/03/24  1:11 PM   Specimen: Anterior Nasal Swab  Result Value Ref Range Status   SARS Coronavirus 2 by RT PCR NEGATIVE NEGATIVE Final   Influenza A by PCR NEGATIVE NEGATIVE Final   Influenza B by PCR NEGATIVE NEGATIVE Final    Comment: (NOTE) The Xpert Xpress SARS-CoV-2/FLU/RSV plus assay is intended as an aid in the diagnosis of influenza from Nasopharyngeal swab specimens and should not be  used as a sole basis for treatment. Nasal washings and aspirates are unacceptable for Xpert Xpress SARS-CoV-2/FLU/RSV testing.  Fact Sheet for Patients: BloggerCourse.com  Fact Sheet for Healthcare Providers: SeriousBroker.it  This test is not yet approved or cleared by the [place]  FDA and has been authorized for detection and/or diagnosis of SARS-CoV-2 by FDA under an Emergency Use Authorization (EUA). This EUA will remain in effect (meaning this test can be used) for the duration of the COVID-19 declaration under Section 564(b)(1) of the Act, 21 U.S.C. section 360bbb-3(b)(1), unless the authorization is terminated or revoked.     Resp Syncytial Virus by PCR NEGATIVE NEGATIVE Final    Comment: (NOTE) Fact Sheet for Patients: BloggerCourse.com  Fact Sheet for Healthcare Providers: SeriousBroker.it  This test is not yet approved or cleared by the [place]  FDA and has been authorized for detection and/or diagnosis of SARS-CoV-2 by FDA under an Emergency Use Authorization (EUA). This EUA will remain in effect (meaning this test can be used) for the duration of the COVID-19 declaration under Section 564(b)(1) of the Act, 21 U.S.C. section 360bbb-3(b)(1), unless the authorization is terminated or revoked.  Performed at [facility], [address] [place], [place] [address]    MRSA Next Gen by PCR, Nasal     Status: None   Collection Time: 05/03/24 10:20 PM   Specimen: Nasal Mucosa; Nasal Swab  Result Value Ref Range Status   MRSA by PCR Next Gen NOT DETECTED NOT DETECTED Final    Comment: (NOTE) The GeneXpert MRSA Assay (FDA approved for NASAL specimens only), is one component of a comprehensive MRSA colonization surveillance program. It is not intended to diagnose MRSA infection nor to guide or monitor treatment for MRSA infections. Test performance is not FDA approved in patients less than 8 years old. Performed at [facility], [address], [place], [place] [address]   Respiratory (~20 pathogens) panel by PCR     Status: None   Collection Time: 05/04/24  6:20 AM   Specimen: Nasopharyngeal Swab; Respiratory  Result Value Ref Range Status   Adenovirus NOT DETECTED NOT DETECTED Final   Coronavirus 229E NOT DETECTED NOT DETECTED Final    Comment: (NOTE) The Coronavirus on the Respiratory Panel, DOES NOT test for the novel  Coronavirus (2019 nCoV)    Coronavirus HKU1 NOT DETECTED NOT DETECTED Final   Coronavirus NL63 NOT DETECTED NOT DETECTED Final   Coronavirus OC43 NOT DETECTED NOT DETECTED Final   Metapneumovirus NOT DETECTED NOT DETECTED Final   Rhinovirus / Enterovirus NOT DETECTED NOT DETECTED Final   Influenza A NOT DETECTED NOT DETECTED Final   Influenza B NOT DETECTED NOT DETECTED Final   Parainfluenza Virus 1 NOT DETECTED NOT DETECTED Final   Parainfluenza Virus 2 NOT DETECTED NOT DETECTED Final   Parainfluenza Virus 3 NOT DETECTED NOT DETECTED Final   Parainfluenza Virus 4 NOT DETECTED NOT DETECTED Final   Respiratory Syncytial Virus NOT DETECTED NOT DETECTED Final   Bordetella pertussis NOT DETECTED NOT DETECTED Final   Bordetella Parapertussis NOT DETECTED NOT DETECTED Final   Chlamydophila pneumoniae NOT DETECTED NOT DETECTED Final   Mycoplasma pneumoniae NOT DETECTED NOT DETECTED Final    Comment: Performed at [facility], [address] [place], [place] [address]    Radiology Reports  No results found.     Signature  -   Lavada Stank M.D on 05/09/2024 at 8:50 AM   -  To page go to [email or web address]

## 2024-05-09 NOTE — Plan of Care (Signed)
  Problem: Clinical Measurements: Goal: Ability to maintain clinical measurements within normal limits will improve Outcome: Progressing Goal: Respiratory complications will improve Outcome: Progressing Goal: Cardiovascular complication will be avoided Outcome: Progressing   Problem: Activity: Goal: Risk for activity intolerance will decrease Outcome: Progressing   Problem: Coping: Goal: Level of anxiety will decrease Outcome: Progressing   Problem: Safety: Goal: Ability to remain free from injury will improve Outcome: Progressing   Problem: Clinical Measurements: Goal: Ability to maintain a body temperature in the normal range will improve Outcome: Progressing   Problem: Respiratory: Goal: Ability to maintain adequate ventilation will improve Outcome: Progressing Goal: Ability to maintain a clear airway will improve Outcome: Progressing

## 2024-05-09 NOTE — Plan of Care (Signed)

## 2024-05-10 DIAGNOSIS — J189 Pneumonia, unspecified organism: Secondary | ICD-10-CM | POA: Diagnosis not present

## 2024-05-10 DIAGNOSIS — A419 Sepsis, unspecified organism: Secondary | ICD-10-CM | POA: Diagnosis not present

## 2024-05-10 NOTE — Progress Notes (Signed)
 NIF -25 with great effort

## 2024-05-10 NOTE — Progress Notes (Signed)
 PROGRESS NOTE        PATIENT DETAILS Name: Allison Moore Age: 43 y.o. Sex: female Date of Birth: 17-Jun-1981 Admit Date: 05/03/2024 Admitting Physician Maximino DELENA Sharps, MD ERE:[id number], Rosina [name], PA  Brief Summary: Patient is a 43 y.o.  female with history of MS (walker at baseline)-who was on Kesimpta injections in May 2025 (?3 doses so far)-who presented to the ED for 4 to 5-day history of subjective fever, nasal congestion, shortness of breath, left ear watery discharge, odynophagia and frequent falls.  She was found to have pneumonia-with sepsis physiology and subsequently admitted to the hospitalist service.  Significant events: 7/10>> admit to [facility]-sepsis-frequent falls-PNA-URI symptoms-left ear discharge.  Significant studies: 7/10>> CT head: No acute intracranial abnormality 7/11>> CXR: Left basilar opacity.  Significant microbiology data: 7/10>> COVID/influenza/RSV PCR: Negative 7/10>> blood cultures: Pending 7/11>> respiratory virus panel: Negative  Procedures: None  Consults: ENT ID GI PCCM  Subjective:  Patient in bed, appears comfortable, denies any headache, no fever, no chest pain or pressure, no shortness of breath , no abdominal pain. No new focal weakness.   Objective: Vitals: Blood pressure (!) 120/90, pulse 95, temperature 98.3 F (36.8 C), temperature source Oral, resp. rate 16, height 5' 4 (1.626 m), weight 101 kg, SpO2 96%.   Exam:  Awake, more alert morning of 05/07/2024, No new F.N deficits, Normal affect [place].AT,PERRAL Supple Neck, No JVD,   Symmetrical Chest wall movement, few bibasilar crackles RRR,No Gallops, Rubs or new Murmurs,  +ve B.Sounds, Abd Soft, No tenderness,   No Cyanosis, Clubbing or edema    Assessment/Plan:  Sepsis-with left otitis externa, acute on chronic sinusitis, possible aspiration pneumonia, possible esophagitis in a patient who is immunocompromised due to being on ofatumumab injections  for multiple sclerosis  He has been seen by ENT Dr. Roark, PCCM Dr. Shelah, GI Dr. Marinda and ID Dr. Lindia, case discussed with ENT and PCCM, currently on empiric IV Zosyn  finishing on 05/10/2024, PO  Diflucan  and acyclovir , Cipro  eardrops to the left ear ordered after discussions with EN.  Clinically she has shown improvement on 05/05/2024.  Sepsis pathophysiology is resolving, speech therapy also following closely as aspiration of oral secretions is high on the list.  Speech following currently on a dysphagia 2 diet, also seen by GI this admission, now wait and watch.  Ongoing dysphagia and some odynophagia.  Currently n.p.o., speech GI on board.  On fluconazole  for high risk of Candida esophagitis due to being immunocompromised, [facility] would like to follow the patient in the office.  History of MS with multiple falls Discussed with neurology-no headache, no meningismus, no photophobia, MRI brain nonacute, MRI C and T-spine ordered with potential new lesions in the C-spine, neurology is following.  Patient prefers to follow-up with her neurologist in the outpatient setting for any further treatment modalities.  For now plan is home discharge on 05/11/2024.  Waxing and waning mental status on 05/06/2024.  Metabolic encephalopathy, able EEG and VBG, neuro and ID along with PCCM following.  Mentation has improved on 05/07/2024.   Hypomagnesemia.  Replaced.    HLD Statin  Schizoaffective disorder Anxiety/depression Anxiety slightly worse due to acute illness Continue Zoloft /risperidone   Class 2 Obesity: Estimated body mass index is 38.22 kg/m as calculated from the following:   Height as of this encounter: 5' 4 (1.626 m).   Weight as of  this encounter: 101 kg.   Code status:   Code Status: Full Code   DVT Prophylaxis: enoxaparin  (LOVENOX ) injection 40 mg Start: 05/06/24 1000 SCDs Start: 05/03/24 1357    Family Communication: Father-Lester Gruwell-[phone] updated site  05/05/2024   Disposition Plan: Status is: Inpatient Remains inpatient appropriate because: Severity of illness   Planned Discharge Destination:Home health versus SNF   Diet: Diet Order             DIET DYS 2 Fluid consistency: Thin  Diet effective now                     Data Review:   Inpatient Medications  Scheduled Meds:  acyclovir   400 mg Oral 5 X Daily   atorvastatin   20 mg Oral QHS   ciprofloxacin -dexamethasone   4 drop Left EAR BID   enoxaparin  (LOVENOX ) injection  40 mg Subcutaneous Daily   fluconazole   400 mg Oral Daily   gabapentin   300 mg Oral TID   guaiFENesin   600 mg Oral BID   magic mouthwash w/lidocaine   15 mL Oral TID AC & HS   midodrine   10 mg Oral TID WC   multivitamin with minerals  1 tablet Oral Daily   pantoprazole   40 mg Oral BID   risperiDONE   3 mg Oral QHS   sertraline   150 mg Oral Daily   Continuous Infusions:  piperacillin -tazobactam (ZOSYN )  IV 3.375 g (05/10/24 0449)   PRN Meds:.albuterol , hydrOXYzine , sodium chloride   DVT Prophylaxis  enoxaparin  (LOVENOX ) injection 40 mg Start: 05/06/24 1000 SCDs Start: 05/03/24 1357   Recent Labs  Lab 05/05/24 0550 05/06/24 0802 05/07/24 0556 05/08/24 0551 05/09/24 0502  WBC 13.6* 10.5 8.6 9.2 9.3  HGB 9.7* 9.3* 8.7* 9.6* 10.5*  HCT 29.4* 28.0* 26.8* 29.5* 31.9*  PLT 213 240 276 339 429*  MCV 88.3 88.3 89.0 89.4 89.4  MCH 29.1 29.3 28.9 29.1 29.4  MCHC 33.0 33.2 32.5 32.5 32.9  RDW 13.8 13.7 14.0 13.5 13.4  LYMPHSABS 0.6* 1.0 1.5 1.8 2.2  MONOABS 0.5 0.7 0.9 0.9 0.9  EOSABS 0.2 0.3 0.3 0.3 0.4  BASOSABS 0.0 0.0 0.0 0.1 0.1    Recent Labs  Lab 05/03/24 1118 05/03/24 1203 05/03/24 1204 05/03/24 1402 05/04/24 0629 05/04/24 1540 05/05/24 0550 05/05/24 0819 05/06/24 0802 05/06/24 1133 05/07/24 0556 05/08/24 0551 05/09/24 0502  NA 135   < >  --   --  134*  --  137  --  137  --  138 139 140  K 3.8   < >  --   --  3.7  --  3.5  --  3.3*  --  3.6 3.4* 4.1  CL 100   < >  --    --  105  --  108  --  108  --  108 103 101  CO2 24  --   --   --  20*  --  20*  --  21*  --  23 26 27   ANIONGAP 11  --   --   --  9  --  9  --  8  --  7 10 12   GLUCOSE 119*   < >  --   --  116*  --  96  --  109*  --  98 126* 93  BUN 11   < >  --   --  16  --  12  --  11  --  7 7 10  CREATININE 0.98   < >  --   --  1.18*  --  0.91  --  0.74  --  0.77 0.81 0.86  AST 43*  --   --   --   --   --  65*  --  28  --  19 23 27   ALT 40  --   --   --   --   --  92*  --  65*  --  50* 46* 46*  ALKPHOS 66  --   --   --   --   --  78  --  80  --  70 72 68  BILITOT 1.2  --   --   --   --   --  1.5*  --  1.0  --  0.7 0.6 0.5  ALBUMIN 3.6  --   --   --   --   --  2.5*  --  2.4*  --  2.4* 2.5* 2.6*  CRP  --   --   --   --   --   --  21.5*  --  19.5*  --  12.6* 8.9*  --   PROCALCITON  --   --   --    < > 2.24  --  1.57  --  0.87  --  0.49 0.31 0.20  LATICACIDVEN  --   --  1.2  --  1.7 1.4  --   --   --   --   --   --   --   INR 1.1  --   --   --   --   --   --  1.3*  --   --   --   --   --   TSH  --   --   --   --  2.461  --   --   --   --   --   --   --   --   AMMONIA  --   --   --   --   --   --   --   --   --  40*  --   --   --   BNP  --   --   --   --  99.1  --   --   --   --   --   --   --   --   MG  --   --   --   --  1.4*  --  2.5*  --  2.1  --  2.0 1.7  --   PHOS  --   --   --   --   --   --  2.9  --  2.8  --  3.5 3.5  --   CALCIUM  9.4  --   --   --  7.9*  --  8.1*  --  8.3*  --  8.6* 8.9 9.5   < > = values in this interval not displayed.      Recent Labs  Lab 05/03/24 1118 05/03/24 1204 05/03/24 1402 05/04/24 0629 05/04/24 1540 05/05/24 0550 05/05/24 0819 05/06/24 0802 05/06/24 1133 05/07/24 0556 05/08/24 0551 05/09/24 0502  CRP  --   --   --   --   --  21.5*  --  19.5*  --  12.6* 8.9*  --   PROCALCITON  --   --    < >  2.24  --  1.57  --  0.87  --  0.49 0.31 0.20  LATICACIDVEN  --  1.2  --  1.7 1.4  --   --   --   --   --   --   --   INR 1.1  --   --   --   --   --  1.3*  --   --   --    --   --   TSH  --   --   --  2.461  --   --   --   --   --   --   --   --   AMMONIA  --   --   --   --   --   --   --   --  40*  --   --   --   BNP  --   --   --  99.1  --   --   --   --   --   --   --   --   MG  --   --   --  1.4*  --  2.5*  --  2.1  --  2.0 1.7  --   CALCIUM  9.4  --   --  7.9*  --  8.1*  --  8.3*  --  8.6* 8.9 9.5   < > = values in this interval not displayed.    --------------------------------------------------------------------------------------------------------------- Lab Results  Component Value Date   CHOL 176 11/15/2023   HDL 50 11/15/2023   LDLCALC 114 (H) 11/15/2023   TRIG 62 11/15/2023   CHOLHDL 3.5 11/15/2023    Lab Results  Component Value Date   HGBA1C 5.1 11/15/2023    Micro Results Recent Results (from the past 240 hours)  Blood Culture (routine x 2)     Status: None   Collection Time: 05/03/24 11:23 AM   Specimen: BLOOD  Result Value Ref Range Status   Specimen Description BLOOD SITE NOT SPECIFIED  Final   Special Requests   Final    BOTTLES DRAWN AEROBIC AND ANAEROBIC Blood Culture adequate volume   Culture   Final    NO GROWTH 5 DAYS Performed at [facility], [address], [place], [place] [address]    Report Status 05/08/2024 FINAL  Final  Blood Culture (routine x 2)     Status: None   Collection Time: 05/03/24 11:43 AM   Specimen: BLOOD LEFT ARM  Result Value Ref Range Status   Specimen Description BLOOD LEFT ARM  Final   Special Requests   Final    BOTTLES DRAWN AEROBIC AND ANAEROBIC Blood Culture adequate volume   Culture   Final    NO GROWTH 5 DAYS Performed at [facility], [address] [place], [place] [address]    Report Status 05/08/2024 FINAL  Final  Resp panel by RT-PCR (RSV, Flu A&B, Covid) Anterior Nasal Swab     Status: None   Collection Time: 05/03/24  1:11 PM   Specimen: Anterior Nasal Swab  Result Value Ref Range Status   SARS Coronavirus 2 by RT PCR NEGATIVE NEGATIVE Final   Influenza A  by PCR NEGATIVE NEGATIVE Final   Influenza B by PCR NEGATIVE NEGATIVE Final    Comment: (NOTE) The Xpert Xpress SARS-CoV-2/FLU/RSV plus assay is intended as an aid in the diagnosis of influenza from Nasopharyngeal swab specimens and should not be used  as a sole basis for treatment. Nasal washings and aspirates are unacceptable for Xpert Xpress SARS-CoV-2/FLU/RSV testing.  Fact Sheet for Patients: BloggerCourse.com  Fact Sheet for Healthcare Providers: SeriousBroker.it  This test is not yet approved or cleared by the [place]  FDA and has been authorized for detection and/or diagnosis of SARS-CoV-2 by FDA under an Emergency Use Authorization (EUA). This EUA will remain in effect (meaning this test can be used) for the duration of the COVID-19 declaration under Section 564(b)(1) of the Act, 21 U.S.C. section 360bbb-3(b)(1), unless the authorization is terminated or revoked.     Resp Syncytial Virus by PCR NEGATIVE NEGATIVE Final    Comment: (NOTE) Fact Sheet for Patients: BloggerCourse.com  Fact Sheet for Healthcare Providers: SeriousBroker.it  This test is not yet approved or cleared by the [place]  FDA and has been authorized for detection and/or diagnosis of SARS-CoV-2 by FDA under an Emergency Use Authorization (EUA). This EUA will remain in effect (meaning this test can be used) for the duration of the COVID-19 declaration under Section 564(b)(1) of the Act, 21 U.S.C. section 360bbb-3(b)(1), unless the authorization is terminated or revoked.  Performed at [facility] Lab, [address] [place], [place] [address]   MRSA Next Gen by PCR, Nasal     Status: None   Collection Time: 05/03/24 10:20 PM   Specimen: Nasal Mucosa; Nasal Swab  Result Value Ref Range Status   MRSA by PCR Next Gen NOT DETECTED NOT DETECTED Final    Comment: (NOTE) The GeneXpert MRSA  Assay (FDA approved for NASAL specimens only), is one component of a comprehensive MRSA colonization surveillance program. It is not intended to diagnose MRSA infection nor to guide or monitor treatment for MRSA infections. Test performance is not FDA approved in patients less than 53 years old. Performed at [facility], [address], [place], [place] [address]   Respiratory (~20 pathogens) panel by PCR     Status: None   Collection Time: 05/04/24  6:20 AM   Specimen: Nasopharyngeal Swab; Respiratory  Result Value Ref Range Status   Adenovirus NOT DETECTED NOT DETECTED Final   Coronavirus 229E NOT DETECTED NOT DETECTED Final    Comment: (NOTE) The Coronavirus on the Respiratory Panel, DOES NOT test for the novel  Coronavirus (2019 nCoV)    Coronavirus HKU1 NOT DETECTED NOT DETECTED Final   Coronavirus NL63 NOT DETECTED NOT DETECTED Final   Coronavirus OC43 NOT DETECTED NOT DETECTED Final   Metapneumovirus NOT DETECTED NOT DETECTED Final   Rhinovirus / Enterovirus NOT DETECTED NOT DETECTED Final   Influenza A NOT DETECTED NOT DETECTED Final   Influenza B NOT DETECTED NOT DETECTED Final   Parainfluenza Virus 1 NOT DETECTED NOT DETECTED Final   Parainfluenza Virus 2 NOT DETECTED NOT DETECTED Final   Parainfluenza Virus 3 NOT DETECTED NOT DETECTED Final   Parainfluenza Virus 4 NOT DETECTED NOT DETECTED Final   Respiratory Syncytial Virus NOT DETECTED NOT DETECTED Final   Bordetella pertussis NOT DETECTED NOT DETECTED Final   Bordetella Parapertussis NOT DETECTED NOT DETECTED Final   Chlamydophila pneumoniae NOT DETECTED NOT DETECTED Final   Mycoplasma pneumoniae NOT DETECTED NOT DETECTED Final    Comment: Performed at [facility], [address], [place], [place] [address]    Radiology Reports  No results found.   Signature  -   Lavada Stank M.D on 05/10/2024 at 8:32 AM   -  To page go to [email or web address]

## 2024-05-10 NOTE — Progress Notes (Signed)
 NIF -20 with good pt effort

## 2024-05-10 NOTE — Progress Notes (Signed)
 [facility]  Date of Admission:  05/03/2024   Total days of inpatient antibiotics 6  Principal Problem:   Sepsis due to pneumonia [facility]) Active Problems:   Hyperlipidemia   OBESITY, NOS   Schizoaffective disorder, depressive type (HCC)   Multiple sclerosis (HCC)   Frequent falls   Acute respiratory failure with hypoxia (HCC)   Esophageal dysphagia   Odynophagia   Acute pansinusitis   Otitis externa   Pneumonia due to infectious organism   Acute respiratory distress          Assessment: 43 year old female admitted with #Otitis media externa with significant sinusitis #Multiple sclerosis started on kesimpta #Dysphagia mother secondary to #1 and immunocompromise state secondary to thrush versus HSV #Possible pneumonia #Fever and leukocytosis -resolved  - CT chest initially has shown right hilar lower lobe collapse/consolidation could be related to pneumonia. - CT maxillofacial showed para sinusitis and multiple focal obstruction sinus drainage pathways, partial opacification left middle ear cavity-poor dentition with multifocal dental.  Dental disease.  Wire CTL showed increased T2 and spinal cord C2 and C4 compatible demyelinating plaques - Patient was started on acyclovir , vancomycin  was stopped fluconazole  continued. -Fever curve is trending down - Given new finding on MRI, concern for worsening MS symptoms.  Discussed with neurology in regards to timing of starting steroids for MS symptoms.  If patient remains afebrile for the next 72 hours(pending negative blood Cx and cloinical progression) I think steroids are okay to discharge from the ID perspective as sinusitis/PNA generally treated for about 5-7 days(given immunosuppression would likely opt for 10 days).  Neurology plans on assessing patient tomorrow to see if range of motion is improved, need for steroids.  Patient has clinically improved significantly.  She states today that she feels  100% better. Recommendations: -Continue pip-tazo to complete 7 days tomorrow 7/17. -acylovir and fluconazole  through 7/24 to complete 2 weeks -Ok to start steroids form ID perspective tomorrow -ID will SO  Evaluation of this patient requires complex antimicrobial therapy evaluation and counseling + isolation needs for disease transmission risk assessment and mitigation     Microbiology:   Antibiotics: Acyclovir  Fluconazole  7/11 present Pip-tazo 7/11-present 710 vancomycin  Cultures: Blood 7/10 no growth Urine  Other   SUBJECTIVE: Laying in bed.  She reports that she feels well Interval: Afebrile overnight,   Review of Systems: Review of Systems  All other systems reviewed and are negative.    Scheduled Meds:  acyclovir   400 mg Oral 5 X Daily   atorvastatin   20 mg Oral QHS   ciprofloxacin -dexamethasone   4 drop Left EAR BID   enoxaparin  (LOVENOX ) injection  40 mg Subcutaneous Daily   fluconazole   400 mg Oral Daily   gabapentin   300 mg Oral TID   guaiFENesin   600 mg Oral BID   magic mouthwash w/lidocaine   15 mL Oral TID AC & HS   midodrine   10 mg Oral TID WC   multivitamin with minerals  1 tablet Oral Daily   pantoprazole   40 mg Oral BID   risperiDONE   3 mg Oral QHS   sertraline   150 mg Oral Daily   Continuous Infusions:  piperacillin -tazobactam (ZOSYN )  IV 3.375 g (05/10/24 0449)   PRN Meds:.albuterol , hydrOXYzine , sodium chloride  Allergies  Allergen Reactions   Norco [Hydrocodone -Acetaminophen ] Other (See Comments)    Confusion   Pork (Diagnostic) Other (See Comments)    Per pt, contraindicated due to MS   Keflex [Cephalexin] Rash  OBJECTIVE: Vitals:   05/10/24 0000 05/10/24 0400 05/10/24 0441 05/10/24 0800  BP: 105/62 98/67 117/76 (!) 120/90  Pulse: 90 87 77 95  Resp: 20 20 14 16   Temp:   98.6 F (37 C) 98.3 F (36.8 C)  TempSrc:   Oral Oral  SpO2: 93% 93% 96% 96%  Weight:      Height:       Body mass index is 38.22 kg/m.  Physical  Exam Constitutional:      Appearance: Normal appearance.  HENT:     Head: Normocephalic and atraumatic.     Right Ear: Tympanic membrane normal.     Left Ear: Tympanic membrane normal.     Nose: Nose normal.     Mouth/Throat:     Mouth: Mucous membranes are moist.  Eyes:     Extraocular Movements: Extraocular movements intact.     Conjunctiva/sclera: Conjunctivae normal.     Pupils: Pupils are equal, round, and reactive to light.  Cardiovascular:     Rate and Rhythm: Normal rate and regular rhythm.     Heart sounds: No murmur heard.    No friction rub. No gallop.  Pulmonary:     Effort: Pulmonary effort is normal.     Breath sounds: Normal breath sounds.  Abdominal:     General: Abdomen is flat.     Palpations: Abdomen is soft.  Skin:    General: Skin is warm and dry.  Neurological:     General: No focal deficit present.     Mental Status: She is alert and oriented to person, place, and time.  Psychiatric:        Mood and Affect: Mood normal.       Lab Results Lab Results  Component Value Date   WBC 9.3 05/09/2024   HGB 10.5 (L) 05/09/2024   HCT 31.9 (L) 05/09/2024   MCV 89.4 05/09/2024   PLT 429 (H) 05/09/2024    Lab Results  Component Value Date   CREATININE 0.86 05/09/2024   BUN 10 05/09/2024   NA 140 05/09/2024   K 4.1 05/09/2024   CL 101 05/09/2024   CO2 27 05/09/2024    Lab Results  Component Value Date   ALT 46 (H) 05/09/2024   AST 27 05/09/2024   ALKPHOS 68 05/09/2024   BILITOT 0.5 05/09/2024        Loney Stank, MD [facility] 05/10/2024, 9:10 AM

## 2024-05-10 NOTE — Plan of Care (Signed)

## 2024-05-10 NOTE — Progress Notes (Signed)
 Physical Therapy Treatment Patient Details Name: Allison Moore MRN: [id number] DOB: 06-17-1981 Today's Date: 05/10/2024   History of Present Illness Pt is 43 year old presented to [facility] on 05/03/24 for weakness and falls. Pt with sepsis with lt otitis externa, acute sinusitis, possible aspiration PNA and possible esophagitis. PMH - MS,anxiety, depression, obesity, tibial plateau fx    PT Comments  Pt tolerated treatment well today. Pt was able to progress ambulation distance in hallway today with RW CGA/Supervision and 1 seated rest break. No change in DC/DME recs at this time. PT will continue to follow.     If plan is discharge home, recommend the following: A little help with walking and/or transfers;A little help with bathing/dressing/bathroom;Assistance with cooking/housework;Assist for transportation;Help with stairs or ramp for entrance   Can travel by private vehicle     Yes  Equipment Recommendations  Rolling walker (2 wheels);BSC/3in1    Recommendations for Other Services       Precautions / Restrictions Precautions Precautions: Fall Recall of Precautions/Restrictions: Impaired Restrictions Weight Bearing Restrictions Per Provider Order: No     Mobility  Bed Mobility Overal bed mobility: Modified Independent                  Transfers Overall transfer level: Needs assistance Equipment used: Rolling walker (2 wheels) Transfers: Sit to/from Stand Sit to Stand: Supervision           General transfer comment: supervision to rise from EOB.    Ambulation/Gait Ambulation/Gait assistance: Contact guard assist, Supervision Gait Distance (Feet): 400 Feet Assistive device: Rolling walker (2 wheels) Gait Pattern/deviations: Decreased step length - right, Decreased step length - left, Decreased dorsiflexion - right, Decreased dorsiflexion - left, Decreased stride length, Decreased weight shift to left Gait velocity: decreased     General Gait Details:  decreased BLE hip/knee flexion during swing phase. Pt ambulated in hallway & bathroom. 1 seated rest break in the hallway.   Stairs             Wheelchair Mobility     Tilt Bed    Modified Rankin (Stroke Patients Only)       Balance Overall balance assessment: Needs assistance Sitting-balance support: No upper extremity supported, Feet supported Sitting balance-Leahy Scale: Fair Sitting balance - Comments: peri hygiene sitting on toilet without LOB   Standing balance support: During functional activity, Reliant on assistive device for balance Standing balance-Leahy Scale: Poor Standing balance comment: reliant on BUE support.                            Communication Communication Communication: No apparent difficulties Factors Affecting Communication: Reduced clarity of speech  Cognition Arousal: Alert Behavior During Therapy: Flat affect   PT - Cognitive impairments: Problem solving, Safety/Judgement                       PT - Cognition Comments: decreased processing Following commands: Impaired Following commands impaired: Only follows one step commands consistently, Follows multi-step commands with increased time    Cueing Cueing Techniques: Verbal cues  Exercises      General Comments General comments (skin integrity, edema, etc.): VSS      Pertinent Vitals/Pain Pain Assessment Pain Assessment: No/denies pain    Home Living                          Prior Function  PT Goals (current goals can now be found in the care plan section) Progress towards PT goals: Progressing toward goals    Frequency    Min 2X/week      PT Plan      Co-evaluation              AM-PAC PT 6 Clicks Mobility   Outcome Measure  Help needed turning from your back to your side while in a flat bed without using bedrails?: A Lot Help needed moving from lying on your back to sitting on the side of a flat bed without  using bedrails?: A Lot Help needed moving to and from a bed to a chair (including a wheelchair)?: A Lot Help needed standing up from a chair using your arms (e.g., wheelchair or bedside chair)?: A Lot Help needed to walk in hospital room?: Total Help needed climbing 3-5 steps with a railing? : Total 6 Click Score: 10    End of Session Equipment Utilized During Treatment: Gait belt Activity Tolerance: Patient tolerated treatment well Patient left: in bed;with call bell/phone within reach Nurse Communication: Mobility status PT Visit Diagnosis: Unsteadiness on feet (R26.81);Other abnormalities of gait and mobility (R26.89);Repeated falls (R29.6);Muscle weakness (generalized) (M62.81)     Time: 8575-8552 PT Time Calculation (min) (ACUTE ONLY): 23 min  Charges:    $Gait Training: 8-22 mins $Therapeutic Activity: 8-22 mins PT General Charges $$ ACUTE PT VISIT: 1 Visit                     Syriana Croslin B, PT, DPT Acute Rehab Services [phone]    Amalio Loe 05/10/2024, 3:12 PM

## 2024-05-10 NOTE — Progress Notes (Signed)
 Patient achieved -25 NIF with good effort.

## 2024-05-10 NOTE — Progress Notes (Signed)
 Speech Language Pathology Treatment: Dysphagia  Patient Details Name: Allison Moore MRN: [id number] DOB: 11-18-1980 Today's Date: 05/10/2024 Time: 8963-8953 SLP Time Calculation (min) (ACUTE ONLY): 10 min  Assessment / Plan / Recommendation Clinical Impression  SLP conducted skilled therapy session targeting dysphagia goals. Patient recalled previously recommended swallow strategies (esophageal precautions) including slow rate, small bites, and maintaining an upright position independently. She consumed small bites of graham cracker as well as pudding and thin liquids with no overt s/sx of penetration/aspiration nor difficulty. Given suspected esophageal impairments, recommend continuation of Dys2/thin liquid diet, however no further SLP indications are exhibited. SLP will sign off. Patient was left in room with call bell in reach and alarm set.    HPI HPI: 43yo female admitted 05/03/24 with fall, fever (104.9 in ED), cough. PMH: MS (dx 2005), diffuse brain atrophy, anxiety, depression, obesity, multiple falls since starting [facility] 3wks ago. CXR - LLLopacity - atelectasis vx PNA, right lung clear. MRIbrain - no acute infarct      SLP Plan  All goals met;Discharge SLP treatment due to (comment)          Recommendations  Diet recommendations: Dysphagia 2 (fine chop);Thin liquid Liquids provided via: Cup;Straw Medication Administration: Whole meds with liquid Supervision: Staff to assist with self feeding;Patient able to self feed Compensations: Minimize environmental distractions;Slow rate;Small sips/bites;Follow solids with liquid Postural Changes and/or Swallow Maneuvers: Seated upright 90 degrees;Upright 30-60 min after meal                  Oral care BID   Frequent or constant Supervision/Assistance Dysphagia, unspecified (R13.10)     All goals met;Discharge SLP treatment due to (comment)   Allison Moore, M.A., CCC-SLP  Allison Moore  05/10/2024, 12:55 PM

## 2024-05-10 NOTE — Care Management (Cosign Needed)
    Durable Medical Equipment  (From admission, onward)           Start     Ordered   05/10/24 1542  For home use only DME Bedside commode  Once       Question:  Patient needs a bedside commode to treat with the following condition  Answer:  Weakness   05/10/24 1541           The patient will be confined to one room and will need a bedside commode

## 2024-05-10 NOTE — TOC Transition Note (Signed)
 Transition of Care [facility]) - Discharge Note   Patient Details  Name: Allison Moore MRN: [id number] Date of Birth: 17-Sep-1981  Transition of Care [facility]) CM/SW Contact:  Corean JAYSON Canary, RN Phone Number: 05/10/2024, 3:44 PM   Clinical Narrative:    Patient will be going home is walking much improved. She has a walker at home ordered [facility] from [facility].  To bring to bedside in the AM.  She will need HH orders, already accepted from [facility].  No further needs identified     Barriers to Discharge: Continued Medical Work up, English as a second language teacher, SNF Pending bed offer   Patient Goals and CMS Choice Patient states their goals for this hospitalization and ongoing recovery are:: Rehab CMS Medicare.gov Compare Post Acute Care list provided to:: Patient Choice offered to / list presented to : Patient, Parent [facility] ownership interest in [facility].provided to:: Patient    Discharge Placement                       Discharge Plan and Services Additional resources added to the After Visit Summary for   In-house Referral: Clinical Social Work              DME Arranged: Bedside commode DME Agency: [employer] Date DME Agency Contacted: 05/10/24 Time DME Agency Contacted: [phone] Representative spoke with at DME Agency: Darlyn HH Arranged: PT, OT HH Agency: [facility] Date [facility] Agency Contacted: 05/10/24   Representative spoke with at [facility] Agency: Greig Cedar  Social Drivers of Health (SDOH) Interventions SDOH Screenings   Food Insecurity: Food Insecurity Present (05/03/2024)  Housing: High Risk (05/03/2024)  Transportation Needs: Patient Unable To Answer (05/03/2024)  Utilities: Patient Declined (05/03/2024)  Alcohol Screen: Low Risk  (11/11/2023)  Depression (PHQ2-9): Low Risk  (09/22/2021)  Tobacco Use: High Risk (05/03/2024)     Readmission Risk Interventions     No data to display

## 2024-05-10 NOTE — Progress Notes (Signed)
   05/10/24 0920  Mobility  Activity Transferred to/from [facility];Ambulated with assistance in room  Level of Assistance Contact guard assist, steadying assist  Assistive Device Front wheel walker  Distance Ambulated (ft) 40 ft  Activity Response Tolerated fair  Mobility Referral Yes  Mobility visit 1 Mobility  Mobility Specialist Start Time (ACUTE ONLY) 0920  Mobility Specialist Stop Time (ACUTE ONLY) 0955  Mobility Specialist Time Calculation (min) (ACUTE ONLY) 35 min   Mobility Specialist: Progress Note  Pre-Mobility:      HR 98, SpO2 96% RA Post-Mobility:    HR 96, SpO2 97% RA  Pt agreeable to mobility session - received sitting on BSC. Pt with large soft brown BM, peri care completed ind. C/o fatigue towards EOS. Returned to chair with all needs met - call bell within reach. Chair alarm on.   Virgle Boards, BS Mobility Specialist Please contact via SecureChat or  Rehab office at [phone].

## 2024-05-11 ENCOUNTER — Other Ambulatory Visit (HOSPITAL_COMMUNITY): Payer: Self-pay

## 2024-05-11 DIAGNOSIS — A419 Sepsis, unspecified organism: Secondary | ICD-10-CM | POA: Diagnosis not present

## 2024-05-11 DIAGNOSIS — J189 Pneumonia, unspecified organism: Secondary | ICD-10-CM | POA: Diagnosis not present

## 2024-05-11 MED ORDER — FLUCONAZOLE 200 MG PO TABS
400.0000 mg | ORAL_TABLET | Freq: Every day | ORAL | 0 refills | Status: AC
Start: 2024-05-11 — End: 2024-05-17
  Filled 2024-05-11: qty 6, 3d supply, fill #0

## 2024-05-11 MED ORDER — PANTOPRAZOLE SODIUM 40 MG PO TBEC
40.0000 mg | DELAYED_RELEASE_TABLET | Freq: Every day | ORAL | 0 refills | Status: AC
  Filled 2024-05-11: qty 30, 30d supply, fill #0

## 2024-05-11 MED ORDER — CIPROFLOXACIN-DEXAMETHASONE 0.3-0.1 % OT SUSP
4.0000 [drp] | Freq: Two times a day (BID) | OTIC | 0 refills | Status: AC
  Filled 2024-05-11: qty 7.5, 19d supply, fill #0

## 2024-05-11 MED ORDER — MIDODRINE HCL 10 MG PO TABS
10.0000 mg | ORAL_TABLET | Freq: Three times a day (TID) | ORAL | 0 refills | Status: AC
  Filled 2024-05-11: qty 90, 30d supply, fill #0

## 2024-05-11 MED ORDER — ACYCLOVIR 200 MG PO CAPS
400.0000 mg | ORAL_CAPSULE | Freq: Every day | ORAL | 0 refills | Status: AC
  Filled 2024-05-11: qty 30, 3d supply, fill #0

## 2024-05-11 NOTE — Progress Notes (Signed)
NIF -25 with good effort.

## 2024-05-11 NOTE — Discharge Summary (Signed)
 Allison Moore FMW:[id number] DOB: 04-Jan-1981 DOA: 05/03/2024  PCP: [name] [name], PA  Admit date: 05/03/2024  Discharge date: 05/11/2024  Admitted From: Home   Disposition:  Home   Recommendations for Outpatient Follow-up:   Follow up with PCP in 1-2 weeks  PCP Please obtain BMP/CBC, 2 view CXR in 1week,  (see Discharge instructions)   PCP Please follow up on the following pending results:    Home Health: PT, RN, SLP if qualifies Equipment/Devices: as below  Consultations: None  Discharge Condition: Stable    CODE STATUS: Full    Diet Recommendation: Failure to diet   Chief Complaint  Patient presents with   Fall   Fever     Brief history of present illness from the day of admission and additional interim summary    43 y.o.  female with history of MS (walker at baseline)-who was on Kesimpta injections in May 2025 (?3 doses so far)-who presented to the ED for 4 to 5-day history of subjective fever, nasal congestion, shortness of breath, left ear watery discharge, odynophagia and frequent falls.  She was found to have pneumonia-with sepsis physiology and subsequently admitted to the hospitalist service.                                                                  Hospital Course   Sepsis-with left otitis externa, acute on chronic sinusitis, possible aspiration pneumonia, possible esophagitis in a patient who is immunocompromised due to being on ofatumumab injections for multiple sclerosis   He has been seen by ENT Dr. Roark, PCCM Dr. Shelah, GI Dr. Marinda and ID Dr. Lindia, case discussed with ENT and PCCM, currently on empiric IV Zosyn  finishing on 05/10/2024, PO  Diflucan  and acyclovir , Cipro  eardrops to the left ear ordered after discussions with ENT.   Clinically he is much improved, sepsis  pathophysiology has resolved, mental status is back to normal, she is headache free, no photophobia or ear discomfort.  She will be discharged on oral acyclovir  and fluconazole  ending on 05/17/2024 per ID, finished her Zosyn  course yesterday, she will follow-up postdischarge with PCP, ID, ENT along with her primary neurologist.  She is close to her baseline and eager to go home does not want to go to SNF.   Ongoing dysphagia and some odynophagia.  Currently n.p.o., speech GI on board.  On fluconazole  for high risk of Candida esophagitis due to being immunocompromised, [facility] would like to follow the patient in the office.  Overall dysphagia and odynophagia much better.   History of MS with multiple falls Discussed with neurology-no headache, no meningismus, no photophobia, MRI brain nonacute, MRI C and T-spine ordered with potential new lesions in the C-spine, neurology is following.  Patient prefers to follow-up with her neurologist in the outpatient setting  for any further treatment modalities.  For now plan is home discharge on 05/11/2024.  Will defer long-term use of immune suppressants or steroid use for her MRI to her primary neurologist, she has an upcoming appointment on 05/16/2024.   Waxing and waning mental status on 05/06/2024.  Due to metabolic encephalopathy, was seen by neurology, PCCM, EEG and VBG stable, with supportive care back to baseline mentation is at baseline now.SABRA    HLD Statin   Schizoaffective disorder Anxiety/depression Stable now no acute issues Continue Zoloft /risperidone    Class 2 Obesity: Estimated body mass index is 38.22 kg/m, follow-up with PCP for weight loss    Discharge diagnosis     Principal Problem:   Sepsis due to pneumonia [facility]) Active Problems:   Frequent falls   Multiple sclerosis (HCC)   Schizoaffective disorder, depressive type (HCC)   Hyperlipidemia   OBESITY, NOS   Acute respiratory failure with hypoxia (HCC)   Esophageal dysphagia    Odynophagia   Acute pansinusitis   Otitis externa   Pneumonia due to infectious organism   Acute respiratory distress    Discharge instructions    Discharge Instructions     Discharge instructions   Complete by: As directed    Follow with Primary MD [name] [name], PA in 7 days, follow-up with your neurologist within 5 to 7 days and recommended ENT physician and ID physician in the timeframe as suggested.  Get CBC, CMP, magnesium , 2 view Chest X ray -  checked next visit with your primary MD    Activity: As tolerated with Full fall precautions use walker/cane & assistance as needed  Disposition Home    Diet: Dysphagia 2 diet with feeding assistance and aspiration precautions.   Special Instructions: If you have smoked or chewed Tobacco  in the last 2 yrs please stop smoking, stop any regular Alcohol  and or any Recreational drug use.  On your next visit with your primary care physician please Get Medicines reviewed and adjusted.  Please request your Prim.MD to go over all Hospital Tests and Procedure/Radiological results at the follow up, please get all Hospital records sent to your Prim MD by signing hospital release before you go home.  If you experience worsening of your admission symptoms, develop shortness of breath, life threatening emergency, suicidal or homicidal thoughts you must seek medical attention immediately by calling 911 or calling your MD immediately  if symptoms less severe.  You Must read complete instructions/literature along with all the possible adverse reactions/side effects for all the Medicines you take and that have been prescribed to you. Take any new Medicines after you have completely understood and accpet all the possible adverse reactions/side effects.   Do not drive when taking Pain medications.  Do not take more than prescribed Pain, Sleep and Anxiety Medications  Wear Seat belts while driving.   Increase activity slowly   Complete by: As  directed        Discharge Medications   Allergies as of 05/11/2024       Reactions   Norco [hydrocodone -acetaminophen ] Other (See Comments)   Confusion   Pork (diagnostic) Other (See Comments)   Per pt, contraindicated due to MS   Keflex [cephalexin] Rash        Medication List     STOP taking these medications    Kesimpta 20 MG/0.4ML Soaj Generic drug: Ofatumumab       TAKE these medications    acyclovir  200 MG capsule Commonly known as: ZOVIRAX  Take  2 capsules (400 mg total) by mouth 5 (five) times daily.   albuterol  108 (90 Base) MCG/ACT inhaler Commonly known as: VENTOLIN  HFA Inhale 2 puffs into the lungs every 6 (six) hours as needed for shortness of breath or wheezing.   atorvastatin  20 MG tablet Commonly known as: LIPITOR Take 1 tablet (20 mg total) by mouth at bedtime. For high cholesterol   Centrum Women Tabs Take 1 tablet by mouth daily.   ciprofloxacin -dexamethasone  OTIC suspension Commonly known as: CIPRODEX  Place 4 drops into the left ear 2 (two) times daily.   fluconazole  200 MG tablet Commonly known as: DIFLUCAN  Take 2 tablets (400 mg total) by mouth daily for 6 days.   gabapentin  300 MG capsule Commonly known as: NEURONTIN  Take 1 capsule (300 mg total) by mouth 3 (three) times daily. For pain/anxiety   hydrOXYzine  25 MG tablet Commonly known as: ATARAX  Take 1 tablet (25 mg total) by mouth 3 (three) times daily as needed for anxiety.   ibuprofen  200 MG tablet Commonly known as: ADVIL  Take 400 mg by mouth 2 (two) times daily as needed for headache or moderate pain (pain score 4-6).   midodrine  10 MG tablet Commonly known as: PROAMATINE  Take 1 tablet (10 mg total) by mouth 3 (three) times daily with meals.   pantoprazole  40 MG tablet Commonly known as: PROTONIX  Take 1 tablet (40 mg total) by mouth daily.   risperiDONE  3 MG tablet Commonly known as: RISPERDAL  Take 1 tablet (3 mg total) by mouth at bedtime. For mood control    sertraline  100 MG tablet Commonly known as: ZOLOFT  Take 1 tablet (100 mg total) by mouth daily. For depression What changed:  how much to take additional instructions   VITAMIN D -3 PO Take 1 capsule by mouth daily.               Durable Medical Equipment  (From admission, onward)           Start     Ordered   05/10/24 1542  For home use only DME Bedside commode  Once       Question:  Patient needs a bedside commode to treat with the following condition  Answer:  Weakness   05/10/24 1541             Follow-up Information     [facility] Follow up.   Why: For home health services Contact information: [address] [place] [address] [phone]         [name] [name], [place]. Schedule an appointment as soon as possible for a visit in 1 week(s).   Specialty: Physician Assistant Why: Follow-up with your neurologist within a week of discharge. Contact information: [address] [place] [address] [phone]         Roark Rush, MD. Schedule an appointment as soon as possible for a visit in 1 week(s).   Specialty: Otolaryngology Contact information: [address] [place] [address] [phone]                 Major procedures and Radiology Reports - PLEASE review detailed and final reports thoroughly  -      EEG adult Result Date: 05/06/2024 Shelton Arlin KIDD, MD     05/06/2024 10:13 AM Patient Name: Cedric Denison MRN: [id number] Epilepsy Attending: Arlin KIDD Shelton Referring Physician/Provider: Dennise Lavada POUR, MD Date: 05/06/2024 Duration: 23.05 mins Patient history: 43yo F with ams. EEG to evaluate  for seizure Level of alertness: Awake AEDs during EEG study: None Technical aspects: This EEG study was done with scalp electrodes positioned according to the 10-20 International system of electrode placement. Electrical activity was reviewed with band pass filter of 1-70Hz ,  sensitivity of 7 uV/mm, display speed of 14mm/sec with a 60Hz  notched filter applied as appropriate. EEG data were recorded continuously and digitally stored.  Video monitoring was available and reviewed as appropriate. Description: The posterior dominant rhythm consists of 8-9 Hz activity of moderate voltage (25-35 uV) seen predominantly in posterior head regions, symmetric and reactive to eye opening and eye closing. Physiologic photic driving was not seen during photic stimulation. Hyperventilation was not performed.   IMPRESSION: This study is within normal limits. No seizures or epileptiform discharges were seen throughout the recording. A normal interictal EEG does not exclude the diagnosis of epilepsy. Arlin MALVA Krebs   MR THORACIC SPINE W WO CONTRAST Result Date: 05/05/2024 CLINICAL DATA:  Myelopathy, chronic, thoracic spine History of MS-frequent falls-sepsis-rule out flare. EXAM: MRI THORACIC WITHOUT AND WITH CONTRAST TECHNIQUE: Multiplanar and multiecho pulse sequences of the thoracic spine were obtained without and with intravenous contrast. CONTRAST:  10mL GADAVIST  GADOBUTROL  1 MMOL/ML IV SOLN COMPARISON:  MRI of the thoracic spine dated November 17, 2023. FINDINGS: Alignment:  Normal. Vertebrae: Normal. Cord: Normal in morphology and signal intensity. No abnormal enhancement of the spinal cord or the nerve roots. Paraspinal and other soft tissues: Negative. Disc levels: The disc spaces are well preserved throughout the thoracic spine. The spinal canal and neural foramina are widely patent. IMPRESSION: Normal. Electronically Signed   By: Evalene Coho M.D.   On: 05/05/2024 13:36   MR CERVICAL SPINE W WO CONTRAST Result Date: 05/05/2024 CLINICAL DATA:  Myelopathy, chronic, cervical spine History of MS-frequent falls-sepsis-rule out flare. EXAM: MRI CERVICAL SPINE WITHOUT AND WITH CONTRAST TECHNIQUE: Multiplanar and multiecho pulse sequences of the cervical spine, to include the craniocervical  junction and cervicothoracic junction, were obtained without and with intravenous contrast. CONTRAST:  10mL GADAVIST  GADOBUTROL  1 MMOL/ML IV SOLN COMPARISON:  MRI of the cervical spine dated November 18, 2023. FINDINGS: Alignment: Straightening of the normal cervical lordosis. Vertebrae: Intact and heterogeneous in signal intensity. No osseous lesions present. Cord: New focal area of increased T2 signal within the spinal cord at the C2 vertebral body level on the T2 and STIR sequences. There also appears to be a new focal area of increased T2 signal in the right hemi cord at C4, which is evident on image 33 of series 9 and image 9 of series 2. There is no appreciable a contrast enhancement of either lesion. The spinal cord is otherwise normal in signal intensity. Posterior Fossa, vertebral arteries, paraspinal tissues: Negative. Disc levels: There is chronic degenerative disc disease at C5-6 and C6-7, with mild posterior disc bulging and endplate ridging causing mild central spinal canal stenosis at both levels. There is no spinal cord or nerve root impingement. The neural foramina are patent. IMPRESSION: 1. New subtle areas of increased T2 signal within the spinal cord at C2 and C4, compatible with demyelinating plaques. Electronically Signed   By: Evalene Coho M.D.   On: 05/05/2024 13:32   DG Chest Port 1 View Result Date: 05/05/2024 CLINICAL DATA:  Shortness of breath. EXAM: PORTABLE CHEST 1 VIEW COMPARISON:  05/04/2024 FINDINGS: Low lung volumes. Cardiopericardial silhouette is at upper limits of normal for size. Vascular congestion noted with possible interstitial edema. Trace pleural effusions suspected. No acute bony abnormality. Telemetry  leads overlie the chest. IMPRESSION: Low volume film with vascular congestion and possible interstitial edema. Electronically Signed   By: Camellia Candle M.D.   On: 05/05/2024 08:27   MR BRAIN WO CONTRAST Result Date: 05/04/2024 CLINICAL DATA:  Headache, fever,  frequent falls. Concern for sepsis. History of MS. EXAM: MRI HEAD WITHOUT CONTRAST TECHNIQUE: Multiplanar, multiecho pulse sequences of the brain and surrounding structures were obtained without intravenous contrast. COMPARISON:  MRI head 11/19/2023. FINDINGS: Brain: Limited sequences obtained, patient unable to finish examination. No restricted diffusion. Redemonstrated T2/FLAIR hyperintensity in the periventricular and deep white matter similar to the prior study. Few scattered foci of signal abnormality in the cerebellum again noted. No edema, mass effect, or midline shift. Similar appearance of generalized parenchymal volume loss. The basilar cisterns are patent. No extra-axial fluid collections. Ventricles: Prominence of the lateral ventricles suggestive of underlying parenchymal volume loss. Vascular: Skull base flow voids are visualized. Skull and upper cervical spine: No focal abnormality. Sinuses/Orbits: Orbits are symmetric. Mucosal thickening throughout the paranasal sinuses most pronounced in the ethmoid and maxillary sinuses. Other: Left mastoid effusion is new from prior. IMPRESSION: Examination limited due to motion artifact and limited sequences obtained. No acute infarct. Similar white matter signal abnormality suggestive of demyelinating process. Unable to assess for active demyelination given lack of IV contrast. New left mastoid effusion. Paranasal sinus disease is increased from prior. Electronically Signed   By: Donnice Mania M.D.   On: 05/04/2024 14:29   ECHOCARDIOGRAM COMPLETE Result Date: 05/04/2024    ECHOCARDIOGRAM REPORT   Patient Name:   SOLINA HERON Date of Exam: 05/04/2024 Medical Rec #:  [id number]          Height:       64.0 in Accession #:    [id number]         Weight:       222.7 lb Date of Birth:  11/18/1980           BSA:          2.048 m Patient Age:    43 years           BP:           93/49 mmHg Patient Gender: F                  HR:           123 bpm. Exam Location:   Inpatient Procedure: 2D Echo, Cardiac Doppler and Color Doppler (Both Spectral and Color            Flow Doppler were utilized during procedure). Indications:    Acutre respiratory distress R06.03  History:        Patient has no prior history of Echocardiogram examinations.                 Risk Factors:Hyperlipidimia.  Sonographer:    Benard Stallion Referring Phys: [id number] DONALDA M GHIMIRE IMPRESSIONS  1. Left ventricular ejection fraction, by estimation, is 60 to 65%. The left ventricle has normal function. The left ventricle has no regional wall motion abnormalities. Left ventricular diastolic parameters were normal.  2. Right ventricular systolic function is normal. The right ventricular size is normal.  3. The mitral valve is normal in structure. Trivial mitral valve regurgitation.  4. The aortic valve is tricuspid. Aortic valve regurgitation is not visualized. No aortic stenosis is present. FINDINGS  Left Ventricle: Left ventricular ejection fraction, by estimation, is 60 to 65%. The left ventricle has normal  function. The left ventricle has no regional wall motion abnormalities. The left ventricular internal cavity size was normal in size. There is  no left ventricular hypertrophy. Left ventricular diastolic parameters were normal. Right Ventricle: The right ventricular size is normal. No increase in right ventricular wall thickness. Right ventricular systolic function is normal. Left Atrium: Left atrial size was normal in size. Right Atrium: Right atrial size was normal in size. Pericardium: There is no evidence of pericardial effusion. Mitral Valve: The mitral valve is normal in structure. Trivial mitral valve regurgitation. Tricuspid Valve: The tricuspid valve is normal in structure. Tricuspid valve regurgitation is trivial. Aortic Valve: The aortic valve is tricuspid. Aortic valve regurgitation is not visualized. No aortic stenosis is present. Aortic valve mean gradient measures 6.0 mmHg. Aortic valve  peak gradient measures 11.6 mmHg. Aortic valve area, by VTI measures 3.10  cm. Pulmonic Valve: The pulmonic valve was grossly normal. Pulmonic valve regurgitation is trivial. Aorta: The aortic root and ascending aorta are structurally normal, with no evidence of dilitation. IAS/Shunts: The interatrial septum was not well visualized.  LEFT VENTRICLE PLAX 2D LVIDd:         4.40 cm   Diastology LVIDs:         3.00 cm   LV e' medial:    10.20 cm/s LV PW:         0.90 cm   LV E/e' medial:  9.2 LV IVS:        0.90 cm   LV e' lateral:   14.40 cm/s LVOT diam:     2.20 cm   LV E/e' lateral: 6.5 LV SV:         71 LV SV Index:   35 LVOT Area:     3.80 cm  RIGHT VENTRICLE RV S prime:     24.40 cm/s TAPSE (M-mode): 2.2 cm LEFT ATRIUM             Index        RIGHT ATRIUM           Index LA diam:        3.60 cm 1.76 cm/m   RA Area:     10.60 cm LA Vol (A2C):   38.4 ml 18.75 ml/m  RA Volume:   18.60 ml  9.08 ml/m LA Vol (A4C):   30.2 ml 14.75 ml/m LA Biplane Vol: 34.0 ml 16.60 ml/m  AORTIC VALVE AV Area (Vmax):    2.95 cm AV Area (Vmean):   3.02 cm AV Area (VTI):     3.10 cm AV Vmax:           170.00 cm/s AV Vmean:          117.500 cm/s AV VTI:            0.230 m AV Peak Grad:      11.6 mmHg AV Mean Grad:      6.0 mmHg LVOT Vmax:         132.00 cm/s LVOT Vmean:        93.300 cm/s LVOT VTI:          0.188 m LVOT/AV VTI ratio: 0.82  AORTA Ao Root diam: 2.90 cm Ao Asc diam:  3.20 cm MITRAL VALVE               TRICUSPID VALVE MV Area (PHT): 5.97 cm    TR Peak grad:   9.0 mmHg MV Decel Time: 127 msec    TR Vmax:  150.00 cm/s MV E velocity: 94.00 cm/s MV A velocity: 88.20 cm/s  SHUNTS MV E/A ratio:  1.07        Systemic VTI:  0.19 m                            Systemic Diam: 2.20 cm Lonni Nanas MD Electronically signed by Lonni Nanas MD Signature Date/Time: 05/04/2024/12:46:35 PM    Final    CT MAXILLOFACIAL WO CONTRAST Result Date: 05/04/2024 CLINICAL DATA:  Provided history: Mastication  paralysis/weakness. Immunocompromised patient. Ear pain. EXAM: CT MAXILLOFACIAL WITHOUT CONTRAST TECHNIQUE: Multidetector CT imaging of the maxillofacial structures was performed. Multiplanar CT image reconstructions were also generated. RADIATION DOSE REDUCTION: This exam was performed according to the departmental dose-optimization program which includes automated exposure control, adjustment of the mA and/or kV according to patient size and/or use of iterative reconstruction technique. COMPARISON:  Brain MRI 11/19/2023. Head CT 09/03/2011. Maxillofacial CT 05/14/2010. FINDINGS: Osseous: Redemonstrated chronic fracture deformities of the right lamina papyracea and right orbital floor. Poor dentition with multifocal dental and periodontal disease. Incompletely assessed cervical spondylosis. Orbits: No orbital mass or acute orbital finding. Sinuses: Moderate mucosal thickening within the right frontal sinus. Mucosal thickening within the left frontal sinus inferiorly. The frontal sinus drainage pathways are opacified, bilaterally. Bilateral ethmoid sinusitis (overall moderate). Mild mucosal thickening within the bilateral sphenoid sinuses. The sphenoethmoidal recesses are opacified, bilaterally. Moderate mucosal thickening within the bilateral maxillary sinuses. The ostiomeatal units are largely opacified, bilaterally. Soft tissues: The visualized maxillofacial soft tissues are unremarkable. Nonspecific bilateral upper cervical lymphadenopathy. For instance, a left level II lymph node measures 16 mm in short axis (series 5, image 36). Limited intracranial: No evidence of an acute intracranial abnormality within the field of view. Other: Leftward deviation of the bony nasal septum. Bilateral concha bullosa (opacified on the right, partially opacified on the left). Partial opacification of the left middle ear cavity. Left mastoid effusion. IMPRESSION: 1. Pansinusitis with multifocal obstruction of the sinus drainage  pathways. 2. Partial opacification of the left middle ear cavity. Left mastoid effusion. 3. Chronic fracture deformities of the right lamina papyracea and right orbital floor. 4. Poor dentition with multifocal dental and periodontal disease. 5. Nonspecific upper cervical lymphadenopathy. Electronically Signed   By: Rockey Childs D.O.   On: 05/04/2024 09:09   CT CHEST W CONTRAST Result Date: 05/04/2024 CLINICAL DATA:  Respiratory illness. EXAM: CT CHEST WITH CONTRAST TECHNIQUE: Multidetector CT imaging of the chest was performed during intravenous contrast administration. RADIATION DOSE REDUCTION: This exam was performed according to the departmental dose-optimization program which includes automated exposure control, adjustment of the mA and/or kV according to patient size and/or use of iterative reconstruction technique. CONTRAST:  75mL OMNIPAQUE  IOHEXOL  350 MG/ML SOLN COMPARISON:  None Available. FINDINGS: Cardiovascular: The heart size is upper normal to borderline enlarged. No substantial pericardial effusion. No thoracic aortic aneurysm. Mediastinum/Nodes: Substantial artifact of the mediastinum due to patient being scanned with her arms at the side. Within this limitation, no bulky mediastinal lymphadenopathy is evident. 10 mm left thyroid  nodule evident. Recommend thyroid  US  (ref: J Am Coll Radiol. 2015 Feb;12(2): 143-50).There is no hilar lymphadenopathy. Esophagus is largely obscured. There is no axillary lymphadenopathy. Lungs/Pleura: Retro hilar right lower lobe collapse/consolidative opacity evident although characterization limited by artifact. There is streaky collapse/consolidation in the medial lower lobes bilaterally, likely atelectasis. No substantial pleural effusion although trace pleural fluid could be obscured. Upper Abdomen: Substantial beam hardening artifact  limits assessment. Musculoskeletal: No worrisome lytic or sclerotic osseous abnormality. IMPRESSION: 1. Markedly limited study due to  substantial artifact from patient being scanned with her arms at her sides. Given this limitation, follow-up non emergent chest CT likely warranted when patient is better able to participate in positioning. 2. Retro hilar right lower lobe collapse/consolidative opacity evident although characterization limited by artifact. Imaging features could be related to pneumonia. Follow-up CT chest without contrast in 3 months recommended to ensure resolution. 3. Streaky collapse/consolidation in the medial lower lobes bilaterally, likely atelectasis. 4. 10 mm left thyroid  nodule. Recommend thyroid  US . Electronically Signed   By: Camellia Candle M.D.   On: 05/04/2024 09:06   DG Chest Port 1 View Result Date: 05/04/2024 CLINICAL DATA:  Tachycardia EXAM: PORTABLE CHEST 1 VIEW COMPARISON:  05/03/2024 FINDINGS: Heart and mediastinal contours are within normal limits. Left basilar airspace opacity. Right lung clear. No effusions. No acute bony abnormality. IMPRESSION: Left basilar airspace opacity could reflect atelectasis or pneumonia. Electronically Signed   By: Franky Crease M.D.   On: 05/04/2024 02:52   CT Head Wo Contrast Result Date: 05/03/2024 CLINICAL DATA:  Trauma EXAM: CT HEAD WITHOUT CONTRAST TECHNIQUE: Contiguous axial images were obtained from the base of the skull through the vertex without intravenous contrast. RADIATION DOSE REDUCTION: This exam was performed according to the departmental dose-optimization program which includes automated exposure control, adjustment of the mA and/or kV according to patient size and/or use of iterative reconstruction technique. COMPARISON:  MRI head 11/19/2023. FINDINGS: Brain: No acute intracranial hemorrhage. No CT evidence of acute infarct. Nonspecific hypoattenuation in the periventricular and subcortical white matter favored to reflect chronic microvascular ischemic changes. No edema, mass effect, or midline shift. The basilar cisterns are patent. Ventricles: The ventricles  are normal. Vascular: No hyperdense vessel or unexpected calcification. Skull: No acute or aggressive finding. Orbits: Orbits are symmetric. Sinuses: Mucosal thickening throughout the paranasal sinuses particularly in the ethmoid sinuses, right greater than left. Mucosal thickening in the right frontal sinus. Mucosal thickening bilateral maxillary sinuses. Other: Trace fluid in the left mastoid air cells. IMPRESSION: No CT evidence of acute intracranial abnormality. White matter hypoattenuation which may reflect demyelinating process. Paranasal sinus disease as above. Electronically Signed   By: Donnice Mania M.D.   On: 05/03/2024 11:51   DG Chest Portable 1 View Result Date: 05/03/2024 CLINICAL DATA:  Shortness of breath. EXAM: PORTABLE CHEST 1 VIEW COMPARISON:  Chest radiograph dated 11/13/2022. FINDINGS: Shallow inspiration with bibasilar atelectasis. No focal consolidation, pleural effusion or pneumothorax. No acute osseous pathology. IMPRESSION: Shallow inspiration with bibasilar atelectasis. Electronically Signed   By: Vanetta Chou M.D.   On: 05/03/2024 11:39    Micro Results    Recent Results (from the past 240 hours)  Blood Culture (routine x 2)     Status: None   Collection Time: 05/03/24 11:23 AM   Specimen: BLOOD  Result Value Ref Range Status   Specimen Description BLOOD SITE NOT SPECIFIED  Final   Special Requests   Final    BOTTLES DRAWN AEROBIC AND ANAEROBIC Blood Culture adequate volume   Culture   Final    NO GROWTH 5 DAYS Performed at [facility], [address], [place], [place] [address]    Report Status 05/08/2024 FINAL  Final  Blood Culture (routine x 2)     Status: None   Collection Time: 05/03/24 11:43 AM   Specimen: BLOOD LEFT ARM  Result Value Ref Range Status   Specimen  Description BLOOD LEFT ARM  Final   Special Requests   Final    BOTTLES DRAWN AEROBIC AND ANAEROBIC Blood Culture adequate volume   Culture   Final    NO GROWTH 5 DAYS Performed at  [facility], [address] [place], [place] [address]    Report Status 05/08/2024 FINAL  Final  Resp panel by RT-PCR (RSV, Flu A&B, Covid) Anterior Nasal Swab     Status: None   Collection Time: 05/03/24  1:11 PM   Specimen: Anterior Nasal Swab  Result Value Ref Range Status   SARS Coronavirus 2 by RT PCR NEGATIVE NEGATIVE Final   Influenza A by PCR NEGATIVE NEGATIVE Final   Influenza B by PCR NEGATIVE NEGATIVE Final    Comment: (NOTE) The Xpert Xpress SARS-CoV-2/FLU/RSV plus assay is intended as an aid in the diagnosis of influenza from Nasopharyngeal swab specimens and should not be used as a sole basis for treatment. Nasal washings and aspirates are unacceptable for Xpert Xpress SARS-CoV-2/FLU/RSV testing.  Fact Sheet for Patients: BloggerCourse.com  Fact Sheet for Healthcare Providers: SeriousBroker.it  This test is not yet approved or cleared by the [place]  FDA and has been authorized for detection and/or diagnosis of SARS-CoV-2 by FDA under an Emergency Use Authorization (EUA). This EUA will remain in effect (meaning this test can be used) for the duration of the COVID-19 declaration under Section 564(b)(1) of the Act, 21 U.S.C. section 360bbb-3(b)(1), unless the authorization is terminated or revoked.     Resp Syncytial Virus by PCR NEGATIVE NEGATIVE Final    Comment: (NOTE) Fact Sheet for Patients: BloggerCourse.com  Fact Sheet for Healthcare Providers: SeriousBroker.it  This test is not yet approved or cleared by the [place]  FDA and has been authorized for detection and/or diagnosis of SARS-CoV-2 by FDA under an Emergency Use Authorization (EUA). This EUA will remain in effect (meaning this test can be used) for the duration of the COVID-19 declaration under Section 564(b)(1) of the Act, 21 U.S.C. section 360bbb-3(b)(1), unless the authorization  is terminated or revoked.  Performed at [facility], [address], [place], [place] [address]   MRSA Next Gen by PCR, Nasal     Status: None   Collection Time: 05/03/24 10:20 PM   Specimen: Nasal Mucosa; Nasal Swab  Result Value Ref Range Status   MRSA by PCR Next Gen NOT DETECTED NOT DETECTED Final    Comment: (NOTE) The GeneXpert MRSA Assay (FDA approved for NASAL specimens only), is one component of a comprehensive MRSA colonization surveillance program. It is not intended to diagnose MRSA infection nor to guide or monitor treatment for MRSA infections. Test performance is not FDA approved in patients less than 58 years old. Performed at [facility], [address] [place], [place] [address]   Respiratory (~20 pathogens) panel by PCR     Status: None   Collection Time: 05/04/24  6:20 AM   Specimen: Nasopharyngeal Swab; Respiratory  Result Value Ref Range Status   Adenovirus NOT DETECTED NOT DETECTED Final   Coronavirus 229E NOT DETECTED NOT DETECTED Final    Comment: (NOTE) The Coronavirus on the Respiratory Panel, DOES NOT test for the novel  Coronavirus (2019 nCoV)    Coronavirus HKU1 NOT DETECTED NOT DETECTED Final   Coronavirus NL63 NOT DETECTED NOT DETECTED Final   Coronavirus OC43 NOT DETECTED NOT DETECTED Final   Metapneumovirus NOT DETECTED NOT DETECTED Final   Rhinovirus / Enterovirus NOT DETECTED NOT DETECTED Final  Influenza A NOT DETECTED NOT DETECTED Final   Influenza B NOT DETECTED NOT DETECTED Final   Parainfluenza Virus 1 NOT DETECTED NOT DETECTED Final   Parainfluenza Virus 2 NOT DETECTED NOT DETECTED Final   Parainfluenza Virus 3 NOT DETECTED NOT DETECTED Final   Parainfluenza Virus 4 NOT DETECTED NOT DETECTED Final   Respiratory Syncytial Virus NOT DETECTED NOT DETECTED Final   Bordetella pertussis NOT DETECTED NOT DETECTED Final   Bordetella Parapertussis NOT DETECTED NOT DETECTED Final   Chlamydophila pneumoniae NOT DETECTED NOT  DETECTED Final   Mycoplasma pneumoniae NOT DETECTED NOT DETECTED Final    Comment: Performed at [facility], [address] [place], [place] [address]    Today   Subjective    Allison Moore today has no headache,no chest abdominal pain,no new weakness tingling or numbness, feels much better wants to go home today.    Objective   Blood pressure 103/70, pulse 80, temperature 98.1 F (36.7 C), temperature source Oral, resp. rate 20, height 5' 4 (1.626 m), weight 101 kg, SpO2 95%.   Intake/Output Summary (Last 24 hours) at 05/11/2024 0816 Last data filed at 05/11/2024 0626 Gross per 24 hour  Intake 56.54 ml  Output 1500 ml  Net -1443.46 ml    Exam  Awake Alert, No new F.N deficits,    [place].AT,PERRAL Supple Neck,   Symmetrical Chest wall movement, Good air movement bilaterally, CTAB RRR,No Gallops,   +ve B.Sounds, Abd Soft, Non tender,  No Cyanosis, Clubbing or edema    Data Review   Recent Labs  Lab 05/05/24 0550 05/06/24 0802 05/07/24 0556 05/08/24 0551 05/09/24 0502  WBC 13.6* 10.5 8.6 9.2 9.3  HGB 9.7* 9.3* 8.7* 9.6* 10.5*  HCT 29.4* 28.0* 26.8* 29.5* 31.9*  PLT 213 240 276 339 429*  MCV 88.3 88.3 89.0 89.4 89.4  MCH 29.1 29.3 28.9 29.1 29.4  MCHC 33.0 33.2 32.5 32.5 32.9  RDW 13.8 13.7 14.0 13.5 13.4  LYMPHSABS 0.6* 1.0 1.5 1.8 2.2  MONOABS 0.5 0.7 0.9 0.9 0.9  EOSABS 0.2 0.3 0.3 0.3 0.4  BASOSABS 0.0 0.0 0.0 0.1 0.1    Recent Labs  Lab 05/04/24 1540 05/05/24 0550 05/05/24 0819 05/06/24 0802 05/06/24 1133 05/07/24 0556 05/08/24 0551 05/09/24 0502  NA  --  137  --  137  --  138 139 140  K  --  3.5  --  3.3*  --  3.6 3.4* 4.1  CL  --  108  --  108  --  108 103 101  CO2  --  20*  --  21*  --  23 26 27   ANIONGAP  --  9  --  8  --  7 10 12   GLUCOSE  --  96  --  109*  --  98 126* 93  BUN  --  12  --  11  --  7 7 10   CREATININE  --  0.91  --  0.74  --  0.77 0.81 0.86  AST  --  65*  --  28  --  19 23 27   ALT  --  92*  --  65*  --  50*  46* 46*  ALKPHOS  --  78  --  80  --  70 72 68  BILITOT  --  1.5*  --  1.0  --  0.7 0.6 0.5  ALBUMIN  --  2.5*  --  2.4*  --  2.4* 2.5* 2.6*  CRP  --  21.5*  --  19.5*  --  12.6* 8.9*  --   PROCALCITON  --  1.57  --  0.87  --  0.49 0.31 0.20  LATICACIDVEN 1.4  --   --   --   --   --   --   --   INR  --   --  1.3*  --   --   --   --   --   AMMONIA  --   --   --   --  40*  --   --   --   MG  --  2.5*  --  2.1  --  2.0 1.7  --   PHOS  --  2.9  --  2.8  --  3.5 3.5  --   CALCIUM   --  8.1*  --  8.3*  --  8.6* 8.9 9.5    Total Time in preparing paper work, data evaluation and todays exam - 35 minutes  Signature  -    Lavada Stank M.D on 05/11/2024 at 8:16 AM   -  To page go to [email or web address]

## 2024-05-11 NOTE — Plan of Care (Signed)
  Problem: Education: Goal: Knowledge of General Education information will improve Description: Including pain rating scale, medication(s)/side effects and non-pharmacologic comfort measures Outcome: Progressing   Problem: Clinical Measurements: Goal: Will remain free from infection Outcome: Progressing Goal: Diagnostic test results will improve Outcome: Progressing   Problem: Activity: Goal: Risk for activity intolerance will decrease Outcome: Progressing   Problem: Nutrition: Goal: Adequate nutrition will be maintained Outcome: Progressing   Problem: Coping: Goal: Level of anxiety will decrease Outcome: Progressing   Problem: Safety: Goal: Ability to remain free from injury will improve Outcome: Progressing

## 2024-05-12 DIAGNOSIS — E785 Hyperlipidemia, unspecified: Secondary | ICD-10-CM | POA: Diagnosis not present

## 2024-05-12 DIAGNOSIS — G35 Multiple sclerosis: Secondary | ICD-10-CM | POA: Diagnosis not present

## 2024-05-12 DIAGNOSIS — A419 Sepsis, unspecified organism: Secondary | ICD-10-CM | POA: Diagnosis not present

## 2024-05-12 DIAGNOSIS — Z556 Problems related to health literacy: Secondary | ICD-10-CM | POA: Diagnosis not present

## 2024-05-12 DIAGNOSIS — Z79899 Other long term (current) drug therapy: Secondary | ICD-10-CM | POA: Diagnosis not present

## 2024-05-12 DIAGNOSIS — Z87891 Personal history of nicotine dependence: Secondary | ICD-10-CM | POA: Diagnosis not present

## 2024-05-12 DIAGNOSIS — R296 Repeated falls: Secondary | ICD-10-CM | POA: Diagnosis not present

## 2024-05-12 DIAGNOSIS — I081 Rheumatic disorders of both mitral and tricuspid valves: Secondary | ICD-10-CM | POA: Diagnosis not present

## 2024-05-12 DIAGNOSIS — J329 Chronic sinusitis, unspecified: Secondary | ICD-10-CM | POA: Diagnosis not present

## 2024-05-12 DIAGNOSIS — R1312 Dysphagia, oropharyngeal phase: Secondary | ICD-10-CM | POA: Diagnosis not present

## 2024-05-14 ENCOUNTER — Telehealth: Payer: Self-pay

## 2024-05-14 DIAGNOSIS — G35 Multiple sclerosis: Secondary | ICD-10-CM | POA: Diagnosis not present

## 2024-05-14 DIAGNOSIS — K0510 Chronic gingivitis, plaque induced: Secondary | ICD-10-CM | POA: Diagnosis not present

## 2024-05-14 DIAGNOSIS — E782 Mixed hyperlipidemia: Secondary | ICD-10-CM | POA: Diagnosis not present

## 2024-05-14 DIAGNOSIS — Z720 Tobacco use: Secondary | ICD-10-CM | POA: Diagnosis not present

## 2024-05-14 NOTE — Transitions of Care (Post Inpatient/ED Visit) (Signed)
   05/14/2024  Name: Allison Moore MRN: [id number] DOB: [birth year]  Today's TOC FU Call Status: Today's TOC FU Call Status:: Unsuccessful Call (1st Attempt) Unsuccessful Call (1st Attempt) Date: 05/14/24  Attempted to reach the patient regarding the most recent Inpatient/ED visit.  Follow Up Plan: Additional outreach attempts will be made to reach the patient to complete the Transitions of Care (Post Inpatient/ED visit) call.   Brodie Scovell J. Lugene Hitt RN, MSN [facility], [facility] RN Care Manager Direct Dial: [phone]  Fax: [phone] Website: [email or web address]
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
## 2024-05-15 ENCOUNTER — Telehealth: Payer: Self-pay

## 2024-05-15 DIAGNOSIS — R296 Repeated falls: Secondary | ICD-10-CM | POA: Diagnosis not present

## 2024-05-15 DIAGNOSIS — Z79899 Other long term (current) drug therapy: Secondary | ICD-10-CM | POA: Diagnosis not present

## 2024-05-15 DIAGNOSIS — G35 Multiple sclerosis: Secondary | ICD-10-CM | POA: Diagnosis not present

## 2024-05-15 DIAGNOSIS — Z87891 Personal history of nicotine dependence: Secondary | ICD-10-CM | POA: Diagnosis not present

## 2024-05-15 DIAGNOSIS — E785 Hyperlipidemia, unspecified: Secondary | ICD-10-CM | POA: Diagnosis not present

## 2024-05-15 DIAGNOSIS — J329 Chronic sinusitis, unspecified: Secondary | ICD-10-CM | POA: Diagnosis not present

## 2024-05-15 DIAGNOSIS — A419 Sepsis, unspecified organism: Secondary | ICD-10-CM | POA: Diagnosis not present

## 2024-05-15 DIAGNOSIS — I081 Rheumatic disorders of both mitral and tricuspid valves: Secondary | ICD-10-CM | POA: Diagnosis not present

## 2024-05-15 DIAGNOSIS — R1312 Dysphagia, oropharyngeal phase: Secondary | ICD-10-CM | POA: Diagnosis not present

## 2024-05-15 DIAGNOSIS — Z556 Problems related to health literacy: Secondary | ICD-10-CM | POA: Diagnosis not present

## 2024-05-15 NOTE — Transitions of Care (Post Inpatient/ED Visit) (Signed)
   05/15/2024  Name: Ovida Delagarza MRN: [id number] DOB: 04-13-1981  Today's TOC FU Call Status: Today's TOC FU Call Status:: Unsuccessful Call (2nd Attempt) Unsuccessful Call (2nd Attempt) Date: 05/15/24  Attempted to reach the patient regarding the most recent Inpatient/ED visit.  Follow Up Plan: Additional outreach attempts will be made to reach the patient to complete the Transitions of Care (Post Inpatient/ED visit) call.   Soniya Ashraf J. Myosha Cuadras RN, MSN [facility], [facility] RN Care Manager Direct Dial: [phone]  Fax: [phone] Website: [email or web address]

## 2024-05-15 NOTE — Transitions of Care (Post Inpatient/ED Visit) (Signed)
 05/15/2024  Name: Allison Moore MRN: [id number] DOB: 23-Dec-1980  Today's TOC FU Call Status: Today's TOC FU Call Status:: Successful TOC FU Call Completed TOC FU Call Complete Date: 05/15/24 Patient's Name and Date of Birth confirmed.  Transition Care Management Follow-up Telephone Call Date of Discharge: 05/11/24 Discharge Facility: [facility]) Type of Discharge: Inpatient Admission Primary Inpatient Discharge Diagnosis:: Sepsis How have you been since you were released from the hospital?: Better Any questions or concerns?: No  Items Reviewed: Did you receive and understand the discharge instructions provided?: Yes Medications obtained,verified, and reconciled?: Yes (Medications Reviewed) Any new allergies since your discharge?: No Dietary orders reviewed?: Yes Type of Diet Ordered:: Cysphagia Do you have support at home?: Yes People in Home [RPT]: parent(s) Name of Support/Comfort Primary Source: Mom and Dad  Medications Reviewed Today: Medications Reviewed Today     Reviewed by Mikyle Sox, RN (Case Manager) on 05/15/24 at 1505  Med List Status: <None>   Medication Order Taking? Sig Documenting Provider Last Dose Status Informant  acyclovir  (ZOVIRAX ) 200 MG capsule [id number] Yes Take 2 capsules (400 mg total) by mouth 5 (five) times daily. Singh, Prashant K, MD  Active   albuterol  (VENTOLIN  HFA) 108 [phone] Base) MCG/ACT inhaler [id number] Yes Inhale 2 puffs into the lungs every 6 (six) hours as needed for shortness of breath or wheezing. [provider]  Active Self, Pharmacy Records  atorvastatin  (LIPITOR) 20 MG tablet [id number] Yes Take 1 tablet (20 mg total) by mouth at bedtime. For high cholesterol Collene Mac FERNS, NP  Active Pharmacy Records, Self  Cholecalciferol  (VITAMIN D -3 PO) [id number] Yes Take 1 capsule by mouth daily. [provider]  Active Pharmacy Records, Self  ciprofloxacin -dexamethasone  (CIPRODEX ) OTIC suspension [id number] Yes Place 4  drops into the left ear 2 (two) times daily. Singh, Prashant K, MD  Active   fluconazole  (DIFLUCAN ) 200 MG tablet [id number] Yes Take 2 tablets (400 mg total) by mouth daily Singh, Prashant K, MD  Active    Patient not taking:   Discontinued 06/20/20 1312 gabapentin  (NEURONTIN ) 300 MG capsule [id number] Yes Take 1 capsule (300 mg total) by mouth 3 (three) times daily. For pain/anxiety Collene Mac FERNS, NP  Active Pharmacy Records, Self  hydrOXYzine  (ATARAX ) 25 MG tablet [id number] Yes Take 1 tablet (25 mg total) by mouth 3 (three) times daily as needed for anxiety. Collene Mac I, NP  Active Pharmacy Records, Self  ibuprofen  (ADVIL ) 200 MG tablet [id number] Yes Take 400 mg by mouth 2 (two) times daily as needed for headache or moderate pain (pain score 4-6). [provider]  Active Pharmacy Records, Self  midodrine  (PROAMATINE ) 10 MG tablet [id number] Yes Take 1 tablet (10 mg total) by mouth 3 (three) times daily with meals. Singh, Prashant K, MD  Active   Multiple Vitamins-Minerals (CENTRUM WOMEN) TABS [id number] Yes Take 1 tablet by mouth daily. [provider]  Active Pharmacy Records, Self  pantoprazole  (PROTONIX ) 40 MG tablet [id number] Yes Take 1 tablet (40 mg total) by mouth daily. Singh, Prashant K, MD  Active   risperiDONE  (RISPERDAL ) 3 MG tablet [id number] Yes Take 1 tablet (3 mg total) by mouth at bedtime. For mood control Collene Mac FERNS, NP  Active Pharmacy Records, Self  sertraline  (ZOLOFT ) 100 MG tablet [id number] Yes Take 1 tablet (100 mg total) by mouth daily. For depression  Patient taking differently: Take 150 mg by mouth daily. For depression   Collene Mac FERNS, NP  Active Pharmacy Records, Self   Patient  not taking:   Discontinued 06/20/20 1312           Home Care and Equipment/Supplies: Were Home Health Services Ordered?: Yes Name of Home Health Agency:: [facility] Has Agency set up a time to come to your home?: Yes First Home Health Visit Date: 05/14/24 Any new  equipment or medical supplies ordered?: NA  Functional Questionnaire: Do you need assistance with bathing/showering or dressing?: No Do you need assistance with meal preparation?: No Do you need assistance with eating?: No Do you have difficulty maintaining continence: No Do you need assistance with getting out of bed/getting out of a chair/moving?: No Do you have difficulty managing or taking your medications?: No  Follow up appointments reviewed: PCP Follow-up appointment confirmed?: Yes Date of PCP follow-up appointment?: 05/14/24 Follow-up Provider: [facility] Follow-up appointment confirmed?: Yes (ENT referral patient to call.) Date of Specialist follow-up appointment?: 05/16/24 Follow-Up Specialty Provider:: Neurology Do you need transportation to your follow-up appointment?: No Do you understand care options if your condition(s) worsen?: Yes-patient verbalized understanding (Contact physician if:   You have a new or higher fever.   You are coughing more deeply or more often.   You are not getting better after 2 days (48 hours).   You do not get better as expected.)  SDOH Interventions Today    Flowsheet Row Most Recent Value  SDOH Interventions   Food Insecurity Interventions Intervention Not Indicated  Housing Interventions Intervention Not Indicated  Transportation Interventions Intervention Not Indicated  Utilities Interventions Intervention Not Indicated    Ronneisha Jett J. Jarry Manon RN, MSN [facility]  [facility], [facility] RN Care Manager Direct Dial: [phone]  Fax: [phone] Website: [email or web address]

## 2024-05-15 NOTE — Patient Instructions (Signed)
 Visit Information  Thank you for taking time to visit with me today. Please don't hesitate to contact me if I can be of assistance to you.  Contact physician if: You have a new or higher fever. You are coughing more deeply or more often. You are not getting better after 2 days (48 hours). You do not get better as expected.    Patient verbalizes understanding of instructions and care plan provided today and agrees to view in MyChart. Active MyChart status and patient understanding of how to access instructions and care plan via MyChart confirmed with patient.     The patient has been provided with contact information for the care management team and has been advised to call with any health related questions or concerns.   Please call the care guide team at [phone] if you need to cancel or reschedule your appointment.   Please call the Suicide and Crisis Lifeline: 988 if you are experiencing a Mental Health or Behavioral Health Crisis or need someone to talk to.  Sukaina Toothaker J. Mann Skaggs RN, MSN [facility], [facility] RN Care Manager Direct Dial: [phone]  Fax: [phone] Website: [email or web address]

## 2024-05-16 ENCOUNTER — Encounter: Admitting: Obstetrics and Gynecology

## 2024-05-17 ENCOUNTER — Institutional Professional Consult (permissible substitution) (INDEPENDENT_AMBULATORY_CARE_PROVIDER_SITE_OTHER): Admitting: Physician Assistant

## 2024-05-17 DIAGNOSIS — Z79899 Other long term (current) drug therapy: Secondary | ICD-10-CM | POA: Diagnosis not present

## 2024-05-17 DIAGNOSIS — E785 Hyperlipidemia, unspecified: Secondary | ICD-10-CM | POA: Diagnosis not present

## 2024-05-17 DIAGNOSIS — G35 Multiple sclerosis: Secondary | ICD-10-CM | POA: Diagnosis not present

## 2024-05-17 DIAGNOSIS — I081 Rheumatic disorders of both mitral and tricuspid valves: Secondary | ICD-10-CM | POA: Diagnosis not present

## 2024-05-17 DIAGNOSIS — Z556 Problems related to health literacy: Secondary | ICD-10-CM | POA: Diagnosis not present

## 2024-05-17 DIAGNOSIS — R1312 Dysphagia, oropharyngeal phase: Secondary | ICD-10-CM | POA: Diagnosis not present

## 2024-05-17 DIAGNOSIS — J329 Chronic sinusitis, unspecified: Secondary | ICD-10-CM | POA: Diagnosis not present

## 2024-05-17 DIAGNOSIS — A419 Sepsis, unspecified organism: Secondary | ICD-10-CM | POA: Diagnosis not present

## 2024-05-17 DIAGNOSIS — Z87891 Personal history of nicotine dependence: Secondary | ICD-10-CM | POA: Diagnosis not present

## 2024-05-17 DIAGNOSIS — R296 Repeated falls: Secondary | ICD-10-CM | POA: Diagnosis not present
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
## 2024-05-21 ENCOUNTER — Institutional Professional Consult (permissible substitution) (INDEPENDENT_AMBULATORY_CARE_PROVIDER_SITE_OTHER): Admitting: Physician Assistant

## 2024-05-22 DIAGNOSIS — J329 Chronic sinusitis, unspecified: Secondary | ICD-10-CM | POA: Diagnosis not present

## 2024-05-22 DIAGNOSIS — Z87891 Personal history of nicotine dependence: Secondary | ICD-10-CM | POA: Diagnosis not present

## 2024-05-22 DIAGNOSIS — I081 Rheumatic disorders of both mitral and tricuspid valves: Secondary | ICD-10-CM | POA: Diagnosis not present

## 2024-05-22 DIAGNOSIS — Z720 Tobacco use: Secondary | ICD-10-CM | POA: Diagnosis not present

## 2024-05-22 DIAGNOSIS — Z556 Problems related to health literacy: Secondary | ICD-10-CM | POA: Diagnosis not present

## 2024-05-22 DIAGNOSIS — A419 Sepsis, unspecified organism: Secondary | ICD-10-CM | POA: Diagnosis not present

## 2024-05-22 DIAGNOSIS — E785 Hyperlipidemia, unspecified: Secondary | ICD-10-CM | POA: Diagnosis not present

## 2024-05-22 DIAGNOSIS — Z79899 Other long term (current) drug therapy: Secondary | ICD-10-CM | POA: Diagnosis not present

## 2024-05-22 DIAGNOSIS — R1312 Dysphagia, oropharyngeal phase: Secondary | ICD-10-CM | POA: Diagnosis not present

## 2024-05-22 DIAGNOSIS — R296 Repeated falls: Secondary | ICD-10-CM | POA: Diagnosis not present

## 2024-05-22 DIAGNOSIS — E782 Mixed hyperlipidemia: Secondary | ICD-10-CM | POA: Diagnosis not present

## 2024-05-22 DIAGNOSIS — G35 Multiple sclerosis: Secondary | ICD-10-CM | POA: Diagnosis not present

## 2024-05-23 ENCOUNTER — Institutional Professional Consult (permissible substitution) (INDEPENDENT_AMBULATORY_CARE_PROVIDER_SITE_OTHER): Admitting: Physician Assistant

## 2024-05-23 DIAGNOSIS — J329 Chronic sinusitis, unspecified: Secondary | ICD-10-CM | POA: Diagnosis not present

## 2024-05-23 DIAGNOSIS — I081 Rheumatic disorders of both mitral and tricuspid valves: Secondary | ICD-10-CM | POA: Diagnosis not present

## 2024-05-23 DIAGNOSIS — A419 Sepsis, unspecified organism: Secondary | ICD-10-CM | POA: Diagnosis not present

## 2024-05-23 DIAGNOSIS — R1312 Dysphagia, oropharyngeal phase: Secondary | ICD-10-CM | POA: Diagnosis not present

## 2024-05-23 DIAGNOSIS — Z79899 Other long term (current) drug therapy: Secondary | ICD-10-CM | POA: Diagnosis not present

## 2024-05-23 DIAGNOSIS — R296 Repeated falls: Secondary | ICD-10-CM | POA: Diagnosis not present

## 2024-05-23 DIAGNOSIS — Z87891 Personal history of nicotine dependence: Secondary | ICD-10-CM | POA: Diagnosis not present

## 2024-05-23 DIAGNOSIS — G35 Multiple sclerosis: Secondary | ICD-10-CM | POA: Diagnosis not present

## 2024-05-23 DIAGNOSIS — Z556 Problems related to health literacy: Secondary | ICD-10-CM | POA: Diagnosis not present

## 2024-05-23 DIAGNOSIS — E785 Hyperlipidemia, unspecified: Secondary | ICD-10-CM | POA: Diagnosis not present

## 2024-05-28 DIAGNOSIS — A419 Sepsis, unspecified organism: Secondary | ICD-10-CM | POA: Diagnosis not present

## 2024-05-28 DIAGNOSIS — J329 Chronic sinusitis, unspecified: Secondary | ICD-10-CM | POA: Diagnosis not present

## 2024-05-28 DIAGNOSIS — Z87891 Personal history of nicotine dependence: Secondary | ICD-10-CM | POA: Diagnosis not present

## 2024-05-28 DIAGNOSIS — Z79899 Other long term (current) drug therapy: Secondary | ICD-10-CM | POA: Diagnosis not present

## 2024-05-28 DIAGNOSIS — G35 Multiple sclerosis: Secondary | ICD-10-CM | POA: Diagnosis not present

## 2024-05-28 DIAGNOSIS — Z556 Problems related to health literacy: Secondary | ICD-10-CM | POA: Diagnosis not present

## 2024-05-28 DIAGNOSIS — E785 Hyperlipidemia, unspecified: Secondary | ICD-10-CM | POA: Diagnosis not present

## 2024-05-28 DIAGNOSIS — R296 Repeated falls: Secondary | ICD-10-CM | POA: Diagnosis not present

## 2024-05-28 DIAGNOSIS — I081 Rheumatic disorders of both mitral and tricuspid valves: Secondary | ICD-10-CM | POA: Diagnosis not present

## 2024-05-28 DIAGNOSIS — R1312 Dysphagia, oropharyngeal phase: Secondary | ICD-10-CM | POA: Diagnosis not present

## 2024-05-29 DIAGNOSIS — G35 Multiple sclerosis: Secondary | ICD-10-CM | POA: Diagnosis not present

## 2024-05-29 DIAGNOSIS — Z556 Problems related to health literacy: Secondary | ICD-10-CM | POA: Diagnosis not present

## 2024-05-29 DIAGNOSIS — Z79899 Other long term (current) drug therapy: Secondary | ICD-10-CM | POA: Diagnosis not present

## 2024-05-29 DIAGNOSIS — J329 Chronic sinusitis, unspecified: Secondary | ICD-10-CM | POA: Diagnosis not present

## 2024-05-29 DIAGNOSIS — I081 Rheumatic disorders of both mitral and tricuspid valves: Secondary | ICD-10-CM | POA: Diagnosis not present

## 2024-05-29 DIAGNOSIS — R1312 Dysphagia, oropharyngeal phase: Secondary | ICD-10-CM | POA: Diagnosis not present

## 2024-05-29 DIAGNOSIS — E785 Hyperlipidemia, unspecified: Secondary | ICD-10-CM | POA: Diagnosis not present

## 2024-05-29 DIAGNOSIS — R296 Repeated falls: Secondary | ICD-10-CM | POA: Diagnosis not present

## 2024-05-29 DIAGNOSIS — Z87891 Personal history of nicotine dependence: Secondary | ICD-10-CM | POA: Diagnosis not present

## 2024-05-29 DIAGNOSIS — A419 Sepsis, unspecified organism: Secondary | ICD-10-CM | POA: Diagnosis not present

## 2024-06-03 NOTE — Telephone Encounter (Signed)
 ERF patient

## 2024-06-04 ENCOUNTER — Ambulatory Visit (INDEPENDENT_AMBULATORY_CARE_PROVIDER_SITE_OTHER): Admitting: Physician Assistant

## 2024-06-04 ENCOUNTER — Encounter (INDEPENDENT_AMBULATORY_CARE_PROVIDER_SITE_OTHER): Payer: Self-pay | Admitting: Physician Assistant

## 2024-06-04 VITALS — BP 124/84 | HR 80

## 2024-06-04 DIAGNOSIS — H60502 Unspecified acute noninfective otitis externa, left ear: Secondary | ICD-10-CM

## 2024-06-04 DIAGNOSIS — H9192 Unspecified hearing loss, left ear: Secondary | ICD-10-CM | POA: Diagnosis not present
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
## 2024-06-05 DIAGNOSIS — E785 Hyperlipidemia, unspecified: Secondary | ICD-10-CM | POA: Diagnosis not present

## 2024-06-05 DIAGNOSIS — I081 Rheumatic disorders of both mitral and tricuspid valves: Secondary | ICD-10-CM | POA: Diagnosis not present

## 2024-06-05 DIAGNOSIS — J329 Chronic sinusitis, unspecified: Secondary | ICD-10-CM | POA: Diagnosis not present

## 2024-06-05 DIAGNOSIS — Z79899 Other long term (current) drug therapy: Secondary | ICD-10-CM | POA: Diagnosis not present

## 2024-06-05 DIAGNOSIS — Z556 Problems related to health literacy: Secondary | ICD-10-CM | POA: Diagnosis not present

## 2024-06-05 DIAGNOSIS — A419 Sepsis, unspecified organism: Secondary | ICD-10-CM | POA: Diagnosis not present

## 2024-06-05 DIAGNOSIS — G35 Multiple sclerosis: Secondary | ICD-10-CM | POA: Diagnosis not present

## 2024-06-05 DIAGNOSIS — Z87891 Personal history of nicotine dependence: Secondary | ICD-10-CM | POA: Diagnosis not present

## 2024-06-05 DIAGNOSIS — R296 Repeated falls: Secondary | ICD-10-CM | POA: Diagnosis not present

## 2024-06-05 DIAGNOSIS — R1312 Dysphagia, oropharyngeal phase: Secondary | ICD-10-CM | POA: Diagnosis not present

## 2024-06-11 DIAGNOSIS — I081 Rheumatic disorders of both mitral and tricuspid valves: Secondary | ICD-10-CM | POA: Diagnosis not present

## 2024-06-11 DIAGNOSIS — R1312 Dysphagia, oropharyngeal phase: Secondary | ICD-10-CM | POA: Diagnosis not present

## 2024-06-11 DIAGNOSIS — E785 Hyperlipidemia, unspecified: Secondary | ICD-10-CM | POA: Diagnosis not present

## 2024-06-11 DIAGNOSIS — Z87891 Personal history of nicotine dependence: Secondary | ICD-10-CM | POA: Diagnosis not present

## 2024-06-11 DIAGNOSIS — Z79899 Other long term (current) drug therapy: Secondary | ICD-10-CM | POA: Diagnosis not present

## 2024-06-11 DIAGNOSIS — A419 Sepsis, unspecified organism: Secondary | ICD-10-CM | POA: Diagnosis not present

## 2024-06-11 DIAGNOSIS — R296 Repeated falls: Secondary | ICD-10-CM | POA: Diagnosis not present

## 2024-06-11 DIAGNOSIS — G35 Multiple sclerosis: Secondary | ICD-10-CM | POA: Diagnosis not present

## 2024-06-11 DIAGNOSIS — Z556 Problems related to health literacy: Secondary | ICD-10-CM | POA: Diagnosis not present

## 2024-06-11 DIAGNOSIS — J329 Chronic sinusitis, unspecified: Secondary | ICD-10-CM | POA: Diagnosis not present

## 2024-06-12 DIAGNOSIS — M25562 Pain in left knee: Secondary | ICD-10-CM | POA: Diagnosis not present

## 2024-06-12 DIAGNOSIS — W19XXXA Unspecified fall, initial encounter: Secondary | ICD-10-CM | POA: Diagnosis not present

## 2024-06-12 DIAGNOSIS — S59912A Unspecified injury of left forearm, initial encounter: Secondary | ICD-10-CM | POA: Diagnosis not present

## 2024-06-12 DIAGNOSIS — S52612K Displaced fracture of left ulna styloid process, subsequent encounter for closed fracture with nonunion: Secondary | ICD-10-CM | POA: Diagnosis not present

## 2024-06-12 DIAGNOSIS — S62102A Fracture of unspecified carpal bone, left wrist, initial encounter for closed fracture: Secondary | ICD-10-CM | POA: Diagnosis not present

## 2024-06-13 DIAGNOSIS — R296 Repeated falls: Secondary | ICD-10-CM | POA: Diagnosis not present

## 2024-06-13 DIAGNOSIS — J329 Chronic sinusitis, unspecified: Secondary | ICD-10-CM | POA: Diagnosis not present

## 2024-06-13 DIAGNOSIS — G35 Multiple sclerosis: Secondary | ICD-10-CM | POA: Diagnosis not present

## 2024-06-13 DIAGNOSIS — I081 Rheumatic disorders of both mitral and tricuspid valves: Secondary | ICD-10-CM | POA: Diagnosis not present

## 2024-06-13 DIAGNOSIS — Z556 Problems related to health literacy: Secondary | ICD-10-CM | POA: Diagnosis not present

## 2024-06-13 DIAGNOSIS — A419 Sepsis, unspecified organism: Secondary | ICD-10-CM | POA: Diagnosis not present

## 2024-06-13 DIAGNOSIS — R1312 Dysphagia, oropharyngeal phase: Secondary | ICD-10-CM | POA: Diagnosis not present

## 2024-06-13 DIAGNOSIS — Z87891 Personal history of nicotine dependence: Secondary | ICD-10-CM | POA: Diagnosis not present

## 2024-06-13 DIAGNOSIS — Z79899 Other long term (current) drug therapy: Secondary | ICD-10-CM | POA: Diagnosis not present

## 2024-06-13 DIAGNOSIS — E785 Hyperlipidemia, unspecified: Secondary | ICD-10-CM | POA: Diagnosis not present

## 2024-06-19 DIAGNOSIS — Z87891 Personal history of nicotine dependence: Secondary | ICD-10-CM | POA: Diagnosis not present

## 2024-06-19 DIAGNOSIS — G35 Multiple sclerosis: Secondary | ICD-10-CM | POA: Diagnosis not present

## 2024-06-19 DIAGNOSIS — A419 Sepsis, unspecified organism: Secondary | ICD-10-CM | POA: Diagnosis not present

## 2024-06-19 DIAGNOSIS — R1312 Dysphagia, oropharyngeal phase: Secondary | ICD-10-CM | POA: Diagnosis not present

## 2024-06-19 DIAGNOSIS — Z556 Problems related to health literacy: Secondary | ICD-10-CM | POA: Diagnosis not present

## 2024-06-19 DIAGNOSIS — Z79899 Other long term (current) drug therapy: Secondary | ICD-10-CM | POA: Diagnosis not present

## 2024-06-19 DIAGNOSIS — J329 Chronic sinusitis, unspecified: Secondary | ICD-10-CM | POA: Diagnosis not present

## 2024-06-19 DIAGNOSIS — I081 Rheumatic disorders of both mitral and tricuspid valves: Secondary | ICD-10-CM | POA: Diagnosis not present

## 2024-06-19 DIAGNOSIS — E785 Hyperlipidemia, unspecified: Secondary | ICD-10-CM | POA: Diagnosis not present

## 2024-06-19 DIAGNOSIS — R296 Repeated falls: Secondary | ICD-10-CM | POA: Diagnosis not present

## 2024-06-26 DIAGNOSIS — E782 Mixed hyperlipidemia: Secondary | ICD-10-CM | POA: Diagnosis not present

## 2024-06-26 DIAGNOSIS — D649 Anemia, unspecified: Secondary | ICD-10-CM | POA: Diagnosis not present

## 2024-06-26 DIAGNOSIS — J329 Chronic sinusitis, unspecified: Secondary | ICD-10-CM | POA: Diagnosis not present

## 2024-06-26 DIAGNOSIS — Z79899 Other long term (current) drug therapy: Secondary | ICD-10-CM | POA: Diagnosis not present

## 2024-06-26 DIAGNOSIS — R1312 Dysphagia, oropharyngeal phase: Secondary | ICD-10-CM | POA: Diagnosis not present

## 2024-06-26 DIAGNOSIS — R296 Repeated falls: Secondary | ICD-10-CM | POA: Diagnosis not present

## 2024-06-26 DIAGNOSIS — E785 Hyperlipidemia, unspecified: Secondary | ICD-10-CM | POA: Diagnosis not present

## 2024-06-26 DIAGNOSIS — Z720 Tobacco use: Secondary | ICD-10-CM | POA: Diagnosis not present

## 2024-06-26 DIAGNOSIS — G35 Multiple sclerosis: Secondary | ICD-10-CM | POA: Diagnosis not present

## 2024-06-26 DIAGNOSIS — Z556 Problems related to health literacy: Secondary | ICD-10-CM | POA: Diagnosis not present

## 2024-06-26 DIAGNOSIS — I081 Rheumatic disorders of both mitral and tricuspid valves: Secondary | ICD-10-CM | POA: Diagnosis not present

## 2024-06-26 DIAGNOSIS — A419 Sepsis, unspecified organism: Secondary | ICD-10-CM | POA: Diagnosis not present

## 2024-06-26 DIAGNOSIS — Z87891 Personal history of nicotine dependence: Secondary | ICD-10-CM | POA: Diagnosis not present
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
## 2024-07-03 ENCOUNTER — Encounter: Admitting: Obstetrics and Gynecology

## 2024-08-08 DIAGNOSIS — D649 Anemia, unspecified: Secondary | ICD-10-CM | POA: Diagnosis not present

## 2024-08-08 DIAGNOSIS — Z720 Tobacco use: Secondary | ICD-10-CM | POA: Diagnosis not present

## 2024-08-08 DIAGNOSIS — E782 Mixed hyperlipidemia: Secondary | ICD-10-CM | POA: Diagnosis not present

## 2024-08-14 DIAGNOSIS — Z8619 Personal history of other infectious and parasitic diseases: Secondary | ICD-10-CM | POA: Diagnosis not present

## 2024-10-17 ENCOUNTER — Ambulatory Visit: Admitting: Podiatry

## 2024-11-02 ENCOUNTER — Ambulatory Visit: Admitting: Podiatry

## 2024-11-26 ENCOUNTER — Ambulatory Visit: Admitting: Podiatry

## 2024-12-11 ENCOUNTER — Ambulatory Visit: Admitting: Podiatry
